# Patient Record
Sex: Female | Born: 1965
Health system: Southern US, Community
[De-identification: ages and names within clinical notes are randomized; demographics above are authoritative.]

## PROBLEM LIST (undated history)

## (undated) DIAGNOSIS — D649 Anemia, unspecified: Secondary | ICD-10-CM

## (undated) DIAGNOSIS — E038 Other specified hypothyroidism: Secondary | ICD-10-CM

## (undated) DIAGNOSIS — K7030 Alcoholic cirrhosis of liver without ascites: Secondary | ICD-10-CM

## (undated) DIAGNOSIS — I639 Cerebral infarction, unspecified: Secondary | ICD-10-CM

## (undated) DIAGNOSIS — M818 Other osteoporosis without current pathological fracture: Secondary | ICD-10-CM

## (undated) DIAGNOSIS — K909 Intestinal malabsorption, unspecified: Secondary | ICD-10-CM

## (undated) DIAGNOSIS — K219 Gastro-esophageal reflux disease without esophagitis: Secondary | ICD-10-CM

## (undated) DIAGNOSIS — E559 Vitamin D deficiency, unspecified: Secondary | ICD-10-CM

## (undated) DIAGNOSIS — Z5189 Encounter for other specified aftercare: Secondary | ICD-10-CM

## (undated) DIAGNOSIS — F3130 Bipolar disorder, current episode depressed, mild or moderate severity, unspecified: Secondary | ICD-10-CM

## (undated) DIAGNOSIS — F411 Generalized anxiety disorder: Secondary | ICD-10-CM

## (undated) DIAGNOSIS — K766 Portal hypertension: Secondary | ICD-10-CM

## (undated) DIAGNOSIS — E876 Hypokalemia: Secondary | ICD-10-CM

## (undated) DIAGNOSIS — C801 Malignant (primary) neoplasm, unspecified: Secondary | ICD-10-CM

## (undated) DIAGNOSIS — N189 Chronic kidney disease, unspecified: Secondary | ICD-10-CM

## (undated) DIAGNOSIS — F04 Amnestic disorder due to known physiological condition: Secondary | ICD-10-CM

## (undated) DIAGNOSIS — IMO0002 Reserved for concepts with insufficient information to code with codable children: Secondary | ICD-10-CM

## (undated) HISTORY — DX: Chronic kidney disease, unspecified: N18.9

## (undated) HISTORY — DX: Encounter for other specified aftercare: Z51.89

## (undated) HISTORY — DX: Reserved for concepts with insufficient information to code with codable children: IMO0002

## (undated) HISTORY — DX: Anemia, unspecified: D64.9

## (undated) HISTORY — DX: Malignant (primary) neoplasm, unspecified: C80.1

## (undated) HISTORY — DX: Intestinal malabsorption, unspecified: K90.9

## (undated) HISTORY — PX: THYROIDECTOMY: SHX17

## (undated) HISTORY — DX: Hypokalemia: E87.6

## (undated) HISTORY — DX: Gastro-esophageal reflux disease without esophagitis: K21.9

## (undated) HISTORY — DX: Alcoholic cirrhosis of liver without ascites: K70.30

## (undated) HISTORY — PX: GASTRIC BYPASS: SHX52

## (undated) HISTORY — DX: Other specified hypothyroidism: E03.8

## (undated) HISTORY — PX: CATARACT EXTRACTION: SUR2

## (undated) HISTORY — PX: HEMORRHOID SURGERY: SHX153

## (undated) HISTORY — DX: Amnestic disorder due to known physiological condition: F04

## (undated) HISTORY — DX: Other osteoporosis without current pathological fracture: M81.8

## (undated) HISTORY — DX: Cerebral infarction, unspecified: I63.9

## (undated) HISTORY — DX: Portal hypertension: K76.6

## (undated) HISTORY — DX: Vitamin D deficiency, unspecified: E55.9

## (undated) HISTORY — DX: Generalized anxiety disorder: F41.1

## (undated) HISTORY — DX: Bipolar disorder, current episode depressed, mild or moderate severity, unspecified: F31.30

## (undated) HISTORY — PX: HERNIA REPAIR: SHX51

## (undated) HISTORY — PX: OTHER SURGICAL HISTORY: SHX169

## (undated) HISTORY — PX: BREAST EXCISIONAL BIOPSY: SUR124

## (undated) HISTORY — PX: PARTIAL HYSTERECTOMY: SHX80

---

## 1988-07-20 HISTORY — PX: OTHER SURGICAL HISTORY: SHX169

## 1997-05-20 HISTORY — PX: PARTIAL HYSTERECTOMY: SHX80

## 1999-02-19 ENCOUNTER — Other Ambulatory Visit: Admission: RE | Admit: 1999-02-19 | Discharge: 1999-02-19 | Payer: Self-pay | Admitting: Obstetrics and Gynecology

## 1999-06-01 ENCOUNTER — Ambulatory Visit (HOSPITAL_COMMUNITY): Admission: EM | Admit: 1999-06-01 | Discharge: 1999-06-01 | Payer: Self-pay

## 1999-06-01 ENCOUNTER — Encounter: Payer: Self-pay | Admitting: Orthopedic Surgery

## 1999-06-19 ENCOUNTER — Emergency Department (HOSPITAL_COMMUNITY): Admission: EM | Admit: 1999-06-19 | Discharge: 1999-06-19 | Payer: Self-pay | Admitting: Emergency Medicine

## 1999-10-14 ENCOUNTER — Ambulatory Visit (HOSPITAL_BASED_OUTPATIENT_CLINIC_OR_DEPARTMENT_OTHER): Admission: RE | Admit: 1999-10-14 | Discharge: 1999-10-14 | Payer: Self-pay | Admitting: Orthopedic Surgery

## 2003-07-21 HISTORY — PX: GASTRIC BYPASS: SHX52

## 2003-08-22 ENCOUNTER — Other Ambulatory Visit: Admission: RE | Admit: 2003-08-22 | Discharge: 2003-08-22 | Payer: Self-pay | Admitting: Internal Medicine

## 2004-08-11 ENCOUNTER — Ambulatory Visit: Payer: Self-pay | Admitting: Internal Medicine

## 2004-08-11 ENCOUNTER — Inpatient Hospital Stay (HOSPITAL_COMMUNITY): Admission: EM | Admit: 2004-08-11 | Discharge: 2004-08-14 | Payer: Self-pay | Admitting: Emergency Medicine

## 2004-08-20 ENCOUNTER — Emergency Department (HOSPITAL_COMMUNITY): Admission: EM | Admit: 2004-08-20 | Discharge: 2004-08-20 | Payer: Self-pay | Admitting: Emergency Medicine

## 2004-08-22 ENCOUNTER — Ambulatory Visit: Payer: Self-pay | Admitting: Internal Medicine

## 2004-09-19 ENCOUNTER — Ambulatory Visit: Payer: Self-pay | Admitting: Internal Medicine

## 2004-12-22 ENCOUNTER — Ambulatory Visit: Payer: Self-pay | Admitting: Internal Medicine

## 2005-01-14 ENCOUNTER — Ambulatory Visit: Payer: Self-pay | Admitting: Internal Medicine

## 2005-01-26 ENCOUNTER — Ambulatory Visit: Payer: Self-pay | Admitting: Internal Medicine

## 2005-06-01 ENCOUNTER — Ambulatory Visit: Payer: Self-pay | Admitting: Psychiatry

## 2005-06-01 ENCOUNTER — Inpatient Hospital Stay (HOSPITAL_COMMUNITY): Admission: EM | Admit: 2005-06-01 | Discharge: 2005-06-03 | Payer: Self-pay | Admitting: Psychiatry

## 2005-07-21 ENCOUNTER — Ambulatory Visit: Payer: Self-pay | Admitting: Internal Medicine

## 2005-09-14 ENCOUNTER — Ambulatory Visit: Payer: Self-pay | Admitting: Oncology

## 2006-01-04 ENCOUNTER — Ambulatory Visit: Payer: Self-pay | Admitting: Oncology

## 2006-03-01 ENCOUNTER — Ambulatory Visit: Payer: Self-pay | Admitting: Oncology

## 2006-04-26 ENCOUNTER — Ambulatory Visit: Payer: Self-pay | Admitting: Oncology

## 2010-03-18 ENCOUNTER — Encounter
Admission: RE | Admit: 2010-03-18 | Discharge: 2010-04-16 | Payer: Self-pay | Source: Home / Self Care | Admitting: Diagnostic Neuroimaging

## 2010-07-01 ENCOUNTER — Encounter
Admission: RE | Admit: 2010-07-01 | Discharge: 2010-07-01 | Payer: Self-pay | Source: Home / Self Care | Attending: Gastroenterology | Admitting: Gastroenterology

## 2010-12-05 NOTE — Op Note (Signed)
[place]. [facility]  Patient:    Molly Parrish, Molly Parrish                    MRN: [id number] Proc. Date: 10/14/99 Adm. Date:  [id number] Attending:  Sypher, Douglass Rivers CC:         Molly Fitch. Sypher, Montez Hageman., M.D. (2)                           Operative Report  PREOPERATIVE DIAGNOSIS:  Status post segmental fracture of left small finger proximal phalanx and central slip injury to proximal interphalangeal joint.  POSTOPERATIVE DIAGNOSIS:  Healed segmental fracture of left small finger proximal phalanx and healed boutonnierre injury.  OPERATION PERFORMED: 1. Proximal interphalangeal capsulectomy and extensor tenolysis, left small finger. 2. Flexor tenolysis, left small finger.  SURGEON:  Molly Fitch. Sypher, Montez Hageman., M.D.  ASSISTANT:  Molly Bitter. Chabon, P.A.  ANESTHESIA:  IV regional at forearm level.  SUPERVISING ANESTHESIOLOGIST:  Dr. Krista Parrish.  INDICATIONS FOR PROCEDURE:  Molly Parrish is a 45 year old woman who fell on May 31, 1999 sustaining segmental injury to her left small finger.  She was seen at the [facility] emergency room and taken directly to the operating room for open reduction internal fixation of her segmental fracture of the proximal phalanx and repair of the central slip.  She has gone on to heal her fracture but has 0 degrees flexion at her PIP joint and an extensor lag at her IP joint due to severe tenodesis.  She has no active flexion also due to flexor tenodesis due to her complex fracture predicament.  She is brought to the operating room at this time anticipating tenolysis of her  extensor, flexor and PIP capsulectomy.  DESCRIPTION OF PROCEDURE:  Molly Parrish was brought to the operating room and placed in supine position on the operating table.  Following IV regional block t forearm level, the left arm was prepped with Betadine soap and solution and sterilely draped.  The procedure commenced  with incision through the previous dorsal scar. Subcutaneous tissues were carefully divided taking care to preserve the longitudinal veins.  The extensor tendon was identified, carefully lysed from the middle and proximal phalanges with a Therapist, nutritional, Market researcher and rongeur.  The PIP capsule was removed from the dorsal aspect of the ulnar collateral ligament to the dorsal aspect of the radial collateral ligament. The spaces beneath the collateral ligaments were created with the use of a Kleinert  elevator.  The PIP joint was then gently manipulated.  Full passive motion 0 to 110 degrees was recovered.  The DIP motion was noted to be 10 to 70 degrees.  The lateral bands were lysed.  Full extension of the PIP and DIP joint was recovered except for the flexion contracture of the DIP joint.  A central slip tenotomy was performed to improve the extensor lag at the DIP joint. Attention was then directed to the flexor system which was exposed proximal to he A-1 pulley.  With a Brunner zigzag incision.  The superficialis tendon slips were noted to be considerably scarred as was the profundus tendon.  The superficialis and profundus slips were tenolysed with the use of a Therapist, nutritional, a Regulatory affairs officer and scissors.  After adhesions were released from the dorsal surface of the tendons at the A-2 pulley, full active range of motion of the finger was recovered.  0.25% Marcaine was infiltrated  at the base of the finger. Molly Parrish demonstrated full active flexion and extension of 0 degrees at the PIP joint and extensor lag of 20 degrees at the DIP joint following tenolysis under anesthesia.  The wound was then inspected for bleeding points which were electrocauterized by bipolar current followed by repair of the skin with intradermal 3-0 Prolene suture and interrupted sutures of 5-0 nylon in the palm.  She was placed in a voluminous gauze dressing and transferred to the  recovery room with stable vital signs.  She will be discharged with prescriptions for Levaquin 500 mg 1 p.o. q.d. x 5 days as a prophylactic antibiotic.  Also Percocet 5/325 to 2 tablets p.o. q.4-6h. p.r.n. pain total of 20 tablets, no refill.  She will use Advil p.r.n. DD:  10/14/99 TD:  10/15/99 Job: [id number]

## 2010-12-05 NOTE — H&P (Signed)
NAMEKIMMARIE, PASCALE             ACCOUNT NO.:  [id number]   MEDICAL RECORD NO.:  [id number]          PATIENT TYPE:  INP   LOCATION:  0351                         FACILITY:  [facility]   PHYSICIAN:  Valetta Mole. Swords, M.D. [facility] OF BIRTH:  1966-04-07   DATE OF ADMISSION:  08/11/2004  DATE OF DISCHARGE:                                HISTORY & PHYSICAL   CHIEF COMPLAINT:  Alcohol use.   HISTORY OF PRESENT ILLNESS:  Ms. Kettlewell is a 45 year old female who came  to my office today complaining of sinus symptoms and possible urinary  symptoms but her real reason was to confess that she has a long history of  alcohol use and abuse and request evaluation for detoxification.  She states  that she drinks daily, all day long.  She drinks up to a case of beer per  day plus a fifth of alcohol on top of that.  She states if she goes very  many hours without drinking she starts to get agitated and frigidity and  very nervous.  She has stopped alcohol when she was pregnant 12 years ago  but has drunk daily since that time.   PAST MEDICAL HISTORY:  Significant for obesity, she has a history of gastric  bypass which was successful.   SOCIAL HISTORY:  She is married, she has one daughter, she is a nonsmoker.  Alcohol use as above.   FAMILY HISTORY:  Significant for both parents being alcoholics.   CURRENT MEDICATIONS:  None.   REVIEW OF SYMPTOMS:  She states she had one episode of dysuria yesterday.  Urinalysis in the office was normal. She states she has had some sinus  congestion but this has been ongoing and has not change recently.  She  denies any headaches, she denies any abdominal pain, chest pain, shortness  of breath, lower extremity edema or any other complaints in review of  systems other than those listed above.   PHYSICAL EXAMINATION:  VITAL SIGNS:  Temperature 98, blood pressure 120/70,  respirations 14, heart rate 78.  GENERAL:  She appears as a moderately overweight female in no  acute  distress.  HEENT:  Normocephalic, atraumatic, extraocular muscles are intact.  NECK:  Supple without lymphadenopathy, thyromegaly or jugular venous  distention.  CHEST:  Clear to auscultation without any increased work of breathing.  CARDIAC:  S1 and S2 are normal without murmurs or gallops.  ABDOMEN:  Active bowel sounds, soft, nontender.  There is no  hepatosplenomegaly, no masses are palpated.  EXTREMITIES:  There is no cyanosis, clubbing or edema.  NEUROLOGIC:  She is alert and oriented without any motor or sensory  deficits.  Specifically there is no asterixis.  DERMATOLOGIC:  There is no stigmata of chronic liver disease.   LABORATORY DATA:  Laboratories performed in the hospital, liver function  tests are normal except for an SGOT of 43 and albumin of 3.3.  Urine drug  screen is unremarkable, alcohol level 181 mg per deciliter.  BMET normal  except for a calcium of 8.2.   ASSESSMENT/PLAN:  1.  Alcohol dependence. [facility]      They declined detoxification due to likelihood of severe withdrawal.      This was not questioned.  It appears that the patient could be admitted      to [facility]; however, will admit her to [facility]  Will use acute alcohol withdrawal protocol.  I suspect the      patient will be ready to transfer to [facility] whenever      they deem appropriate. Will resume Paxil 20 mg p.o. q.d.  Initially will      place the patient on telemetry to monitor her heart rate as an indicator      of alcohol withdrawal.  2.  Depression.  Will resume Paxil 40 mg p.o. q.d.      BHS/MEDQ  D:  08/12/2004  T:  08/12/2004  Job:  [id number]

## 2010-12-05 NOTE — Discharge Summary (Signed)
Molly Parrish, Molly Parrish NO.:  [id number]   MEDICAL RECORD NO.:  [id number]          PATIENT TYPE:  IPS   LOCATION:  0300                          FACILITY:  [facility]   PHYSICIAN:  Jeanice Lim, M.D. DATE OF BIRTH:  1966-01-04   DATE OF ADMISSION:  06/01/2005  DATE OF DISCHARGE:  06/03/2005                                 DISCHARGE SUMMARY   IDENTIFYING DATA:  This is a 45 year old married Caucasian female,  voluntarily admitted, presented with a history of alcohol abuse, drinking 40  ounces or more of beer and drinks prior to going to work.  Alcohol use is  escalating.  Had beer in her cooler at work, feeling out of control.  Daughter was aware of drinking.  The patient describes herself as a  functional alcoholic until becoming more out of control, had blackouts, had  been detoxed once before.  Longest period of sobriety was 3 months.  This is  her first [facility] admission.  No outpatient treatment.   ADMISSION MEDICATIONS:  Paxil, Xanax, trazodone.   ALLERGIES:  No known drug allergies.   PHYSICAL AND NEUROLOGICAL EXAMINATION:  Essentially within normal limits,  performed at [facility].   MENTAL STATUS EXAM:  Alert, cooperative female, anxious, casually dressed.  Speech clear, within normal limits.  Mood anxious.  Affect anxious.  Thought  process is goal directed with no evident psychosis.  Anxious, tremulous,  cognitively intact.  Judgment and insight were fair with poor impulse  control.  Cognition intact.   ADMITTING DIAGNOSES:  AXIS I:  Alcohol dependence and depressive disorder,  not otherwise specified.  AXIS II:  Deferred.  AXIS III:  None.  AXIS IV:  Moderate stressors.  AXIS V:  40/65.   HOSPITAL COURSE:  The patient was admitted and ordered routine p.r.n.  medications; underwent further monitoring; was encouraged to participate in  individual, group and milieu therapy; was placed on inpatient safe detox  protocol for safe  monitoring due to the patient's extensive, escalating  alcohol pattern, risk of withdrawal.  The patient was detoxed without  complications and was discharged in improved condition with no dangerous  ideation, having tolerated detox without complications, was given medication  education and developed a relapse prevention plan and showed improved  judgment and insight, was given medication education and discharged on:  1.  Paxil CR 12.5 mg in the morning.  2.  Trazodone 150 mg at night.  3.  Seroquel 50 mg at night.  4.  Neurontin 100 mg three times a day and 2 at night.  5.  Librium 25 mg twice a day for one week, then one time a day for one      week, then stop.   DISPOSITION:  The patient was to follow up with Dr. Bayard Parrish on November  21 at 2:15 and with Molly Parrish on November 27 at 10 a.m.   DISCHARGE DIAGNOSES:  AXIS I:  Alcohol dependence, depressive disorder not  otherwise specified.  AXIS II:  Deferred.  AXIS III:  None.  AXIS IV:  Moderate stressors.  AXIS V:  Global assessment  of function on discharge was 55.      Jeanice Lim, M.D.  Electronically Signed     JEM/MEDQ  D:  06/11/2005  T:  06/11/2005  Job:  [id number]

## 2010-12-05 NOTE — Discharge Summary (Signed)
Molly Parrish, Molly Parrish             ACCOUNT NO.:  [id number]   MEDICAL RECORD NO.:  [id number]          PATIENT TYPE:  INP   LOCATION:  0351                         [facility]   PHYSICIAN:  Valetta Mole. Swords, M.D. Romualdo Bolk OF BIRTH:  Jun 01, 1966   DATE OF ADMISSION:  08/11/2004  DATE OF DISCHARGE:  08/14/2004                           DISCHARGE SUMMARY - REFERRING   DISCHARGE DIAGNOSES:  1.  Chronic alcoholism.  2.  Alcohol withdrawal.  3.  Depression.  4.  Obesity status post gastric bypass surgery.   DISCHARGE MEDICATIONS:  1.  Lorazepam 1 mg p.o. t.i.d. x1 day, 1 mg p.o. b.i.d. x1 day, 1 mg p.o.      daily.  2.  Thiamin 100 mg p.o. daily x7 days.  3.  Paxil 60 mg p.o. daily.   FOLLOW UP:  Dr. Cato Mulligan in 10 to 14 days.   PLAN:  She will attend daily A. A. meetings or equivalent as set up by case  management.   CONDITION ON DISCHARGE:  Improved. No signs or symptoms of alcohol  withdrawal.   HOSPITAL PROCEDURES:  None.   HOSPITAL LABORATORIES:  Liver function tests normal except SGOT of 43,  albumin 3.3, urine drug screen was negative. Alcohol level was 181. BMET was  normal except for a calcium of 8.2.   HOSPITAL COURSE:  The patient was admitted to the hospital service on  August 11, 2004. See that note for details. The patient was admitted due to  increased risk of acute alcohol withdrawal. The patient was evaluated by the  ACT team. The patient was treated aggressively on the alcohol withdrawal  program. She did have some episodes of agitation and, tachycardia and  hypertension.  All these were treated with lorazepam. At the time of  discharge the patient had done well. She was not requiring excessive amounts  of lorazepam. I think she will be safe at home. Alcohol withdrawal protocol  was discussed with the patient in detail with medications as above.  She is  also instructed that she can use Ativan p.r.n. for agitation.  The patient  will be discharged after case  management arranges an outpatient program.      BHS/MEDQ  D:  08/14/2004  T:  08/14/2004  Job:  [id number]

## 2013-03-06 ENCOUNTER — Other Ambulatory Visit: Payer: Self-pay | Admitting: Pathology

## 2013-03-20 HISTORY — PX: THYROIDECTOMY: SHX17

## 2013-05-30 ENCOUNTER — Other Ambulatory Visit (HOSPITAL_COMMUNITY): Payer: Self-pay | Admitting: Endocrinology

## 2013-05-30 DIAGNOSIS — C73 Malignant neoplasm of thyroid gland: Secondary | ICD-10-CM

## 2013-06-06 ENCOUNTER — Encounter (HOSPITAL_COMMUNITY)
Admission: RE | Admit: 2013-06-06 | Discharge: 2013-06-06 | Disposition: A | Discharge status: Home / Self Care | Payer: Commercial Managed Care - PPO | Source: Ambulatory Visit | Attending: Endocrinology | Admitting: Endocrinology

## 2013-06-06 DIAGNOSIS — C73 Malignant neoplasm of thyroid gland: Secondary | ICD-10-CM | POA: Insufficient documentation

## 2013-06-06 MED ORDER — THYROTROPIN ALFA 1.1 MG IM SOLR
0.9000 mg | INTRAMUSCULAR | Status: AC
  Administered 2013-06-06: 0.9 mg via INTRAMUSCULAR

## 2013-06-07 ENCOUNTER — Encounter (HOSPITAL_COMMUNITY)
Admission: RE | Admit: 2013-06-07 | Discharge: 2013-06-07 | Disposition: A | Discharge status: Home / Self Care | Payer: Commercial Managed Care - PPO | Source: Ambulatory Visit | Attending: Endocrinology | Admitting: Endocrinology

## 2013-06-07 MED ORDER — THYROTROPIN ALFA 1.1 MG IM SOLR
0.9000 mg | INTRAMUSCULAR | Status: AC
  Administered 2013-06-07: 0.9 mg via INTRAMUSCULAR

## 2013-06-08 ENCOUNTER — Encounter (HOSPITAL_COMMUNITY)
Admission: RE | Admit: 2013-06-08 | Discharge: 2013-06-08 | Disposition: A | Discharge status: Home / Self Care | Payer: Commercial Managed Care - PPO | Source: Ambulatory Visit | Attending: Endocrinology | Admitting: Endocrinology

## 2013-06-08 LAB — HCG, SERUM, QUALITATIVE: Preg, Serum: NEGATIVE

## 2013-06-08 MED ORDER — SODIUM IODIDE I 131 CAPSULE
108.0000 | Freq: Once | INTRAVENOUS | Status: AC | PRN
  Administered 2013-06-08: 108 via ORAL

## 2013-06-16 ENCOUNTER — Encounter (HOSPITAL_COMMUNITY)
Admission: RE | Admit: 2013-06-16 | Discharge: 2013-06-16 | Disposition: A | Discharge status: Home / Self Care | Payer: Commercial Managed Care - PPO | Source: Ambulatory Visit | Attending: Endocrinology | Admitting: Endocrinology

## 2013-06-16 DIAGNOSIS — C73 Malignant neoplasm of thyroid gland: Secondary | ICD-10-CM | POA: Insufficient documentation

## 2013-10-04 DIAGNOSIS — F1021 Alcohol dependence, in remission: Secondary | ICD-10-CM

## 2013-10-04 DIAGNOSIS — K909 Intestinal malabsorption, unspecified: Secondary | ICD-10-CM | POA: Insufficient documentation

## 2013-10-04 DIAGNOSIS — K7460 Unspecified cirrhosis of liver: Secondary | ICD-10-CM | POA: Insufficient documentation

## 2013-10-04 HISTORY — DX: Alcohol dependence, in remission: F10.21

## 2013-11-07 DIAGNOSIS — F429 Obsessive-compulsive disorder, unspecified: Secondary | ICD-10-CM | POA: Insufficient documentation

## 2013-11-07 DIAGNOSIS — F39 Unspecified mood [affective] disorder: Secondary | ICD-10-CM | POA: Insufficient documentation

## 2013-11-07 HISTORY — DX: Obsessive-compulsive disorder, unspecified: F42.9

## 2013-11-07 HISTORY — DX: Unspecified mood (affective) disorder: F39

## 2014-04-20 DIAGNOSIS — I639 Cerebral infarction, unspecified: Secondary | ICD-10-CM | POA: Insufficient documentation

## 2014-04-20 DIAGNOSIS — G43909 Migraine, unspecified, not intractable, without status migrainosus: Secondary | ICD-10-CM

## 2014-04-20 DIAGNOSIS — R413 Other amnesia: Secondary | ICD-10-CM | POA: Insufficient documentation

## 2014-04-20 HISTORY — DX: Migraine, unspecified, not intractable, without status migrainosus: G43.909

## 2014-05-04 DIAGNOSIS — G5603 Carpal tunnel syndrome, bilateral upper limbs: Secondary | ICD-10-CM | POA: Insufficient documentation

## 2014-12-14 ENCOUNTER — Other Ambulatory Visit (HOSPITAL_COMMUNITY): Payer: Self-pay | Admitting: Endocrinology

## 2014-12-14 DIAGNOSIS — C73 Malignant neoplasm of thyroid gland: Secondary | ICD-10-CM

## 2014-12-31 ENCOUNTER — Encounter (HOSPITAL_COMMUNITY)
Admission: RE | Admit: 2014-12-31 | Discharge: 2014-12-31 | Disposition: A | Discharge status: Home / Self Care | Payer: Commercial Managed Care - PPO | Source: Ambulatory Visit | Attending: Endocrinology | Admitting: Endocrinology

## 2014-12-31 DIAGNOSIS — C73 Malignant neoplasm of thyroid gland: Secondary | ICD-10-CM | POA: Insufficient documentation

## 2014-12-31 MED ORDER — THYROTROPIN ALFA 1.1 MG IM SOLR
0.9000 mg | INTRAMUSCULAR | Status: AC
  Administered 2014-12-31: 0.9 mg via INTRAMUSCULAR

## 2015-01-01 ENCOUNTER — Encounter (HOSPITAL_COMMUNITY)
Admission: RE | Admit: 2015-01-01 | Discharge: 2015-01-01 | Disposition: A | Discharge status: Home / Self Care | Payer: Commercial Managed Care - PPO | Source: Ambulatory Visit | Attending: Endocrinology | Admitting: Endocrinology

## 2015-01-01 DIAGNOSIS — C73 Malignant neoplasm of thyroid gland: Secondary | ICD-10-CM | POA: Insufficient documentation

## 2015-01-01 MED ORDER — THYROTROPIN ALFA 1.1 MG IM SOLR
0.9000 mg | INTRAMUSCULAR | Status: AC
  Administered 2015-01-01: 0.9 mg via INTRAMUSCULAR

## 2015-01-02 ENCOUNTER — Encounter (HOSPITAL_COMMUNITY)
Admission: RE | Admit: 2015-01-02 | Discharge: 2015-01-02 | Disposition: A | Discharge status: Home / Self Care | Payer: Commercial Managed Care - PPO | Source: Ambulatory Visit | Attending: Endocrinology | Admitting: Endocrinology

## 2015-01-02 DIAGNOSIS — C73 Malignant neoplasm of thyroid gland: Secondary | ICD-10-CM | POA: Diagnosis not present

## 2015-01-02 LAB — HCG, SERUM, QUALITATIVE: PREG SERUM: NEGATIVE

## 2015-01-02 MED ORDER — SODIUM IODIDE I 131 CAPSULE
4.0000 | Freq: Once | INTRAVENOUS | Status: AC | PRN
  Administered 2015-01-02: 4 via ORAL

## 2015-01-04 ENCOUNTER — Encounter (HOSPITAL_COMMUNITY)
Admission: RE | Admit: 2015-01-04 | Discharge: 2015-01-04 | Disposition: A | Discharge status: Home / Self Care | Payer: Commercial Managed Care - PPO | Source: Ambulatory Visit | Attending: Endocrinology | Admitting: Endocrinology

## 2015-01-04 DIAGNOSIS — C73 Malignant neoplasm of thyroid gland: Secondary | ICD-10-CM | POA: Insufficient documentation

## 2015-12-10 DIAGNOSIS — C73 Malignant neoplasm of thyroid gland: Secondary | ICD-10-CM | POA: Insufficient documentation

## 2015-12-10 DIAGNOSIS — E890 Postprocedural hypothyroidism: Secondary | ICD-10-CM

## 2015-12-10 DIAGNOSIS — E039 Hypothyroidism, unspecified: Secondary | ICD-10-CM | POA: Insufficient documentation

## 2015-12-10 HISTORY — DX: Hypocalcemia: E83.51

## 2015-12-10 HISTORY — DX: Postprocedural hypothyroidism: E89.0

## 2015-12-10 HISTORY — DX: Malignant neoplasm of thyroid gland: C73

## 2016-08-08 DIAGNOSIS — D638 Anemia in other chronic diseases classified elsewhere: Secondary | ICD-10-CM | POA: Insufficient documentation

## 2016-08-08 HISTORY — DX: Anemia in other chronic diseases classified elsewhere: D63.8

## 2016-09-03 DIAGNOSIS — K7030 Alcoholic cirrhosis of liver without ascites: Secondary | ICD-10-CM | POA: Diagnosis not present

## 2016-09-03 DIAGNOSIS — Z9884 Bariatric surgery status: Secondary | ICD-10-CM | POA: Diagnosis not present

## 2016-09-03 DIAGNOSIS — D649 Anemia, unspecified: Secondary | ICD-10-CM | POA: Diagnosis not present

## 2016-09-10 DIAGNOSIS — E611 Iron deficiency: Secondary | ICD-10-CM | POA: Diagnosis not present

## 2016-09-10 DIAGNOSIS — N289 Disorder of kidney and ureter, unspecified: Secondary | ICD-10-CM | POA: Diagnosis not present

## 2016-09-10 DIAGNOSIS — D649 Anemia, unspecified: Secondary | ICD-10-CM | POA: Diagnosis not present

## 2016-11-27 DIAGNOSIS — K7460 Unspecified cirrhosis of liver: Secondary | ICD-10-CM | POA: Diagnosis not present

## 2016-11-27 DIAGNOSIS — D509 Iron deficiency anemia, unspecified: Secondary | ICD-10-CM | POA: Diagnosis not present

## 2018-11-06 DIAGNOSIS — I959 Hypotension, unspecified: Secondary | ICD-10-CM

## 2018-11-06 DIAGNOSIS — E870 Hyperosmolality and hypernatremia: Secondary | ICD-10-CM

## 2018-11-06 DIAGNOSIS — K229 Disease of esophagus, unspecified: Secondary | ICD-10-CM

## 2018-11-06 DIAGNOSIS — W19XXXA Unspecified fall, initial encounter: Secondary | ICD-10-CM | POA: Diagnosis not present

## 2018-11-06 DIAGNOSIS — M549 Dorsalgia, unspecified: Secondary | ICD-10-CM | POA: Diagnosis not present

## 2018-11-06 DIAGNOSIS — E872 Acidosis: Secondary | ICD-10-CM | POA: Diagnosis not present

## 2018-11-06 DIAGNOSIS — R571 Hypovolemic shock: Secondary | ICD-10-CM

## 2018-11-06 DIAGNOSIS — N179 Acute kidney failure, unspecified: Secondary | ICD-10-CM | POA: Diagnosis not present

## 2018-11-07 DIAGNOSIS — M549 Dorsalgia, unspecified: Secondary | ICD-10-CM | POA: Diagnosis not present

## 2018-11-07 DIAGNOSIS — N179 Acute kidney failure, unspecified: Secondary | ICD-10-CM | POA: Diagnosis not present

## 2018-11-07 DIAGNOSIS — E860 Dehydration: Secondary | ICD-10-CM

## 2018-11-07 DIAGNOSIS — E876 Hypokalemia: Secondary | ICD-10-CM

## 2018-11-07 DIAGNOSIS — E890 Postprocedural hypothyroidism: Secondary | ICD-10-CM | POA: Diagnosis not present

## 2018-11-07 DIAGNOSIS — K7030 Alcoholic cirrhosis of liver without ascites: Secondary | ICD-10-CM | POA: Diagnosis not present

## 2018-11-07 DIAGNOSIS — K2270 Barrett's esophagus without dysplasia: Secondary | ICD-10-CM

## 2018-11-08 DIAGNOSIS — M549 Dorsalgia, unspecified: Secondary | ICD-10-CM | POA: Diagnosis not present

## 2018-11-08 DIAGNOSIS — N179 Acute kidney failure, unspecified: Secondary | ICD-10-CM | POA: Diagnosis not present

## 2018-11-08 DIAGNOSIS — K7030 Alcoholic cirrhosis of liver without ascites: Secondary | ICD-10-CM | POA: Diagnosis not present

## 2018-11-08 DIAGNOSIS — E890 Postprocedural hypothyroidism: Secondary | ICD-10-CM | POA: Diagnosis not present

## 2019-08-29 ENCOUNTER — Other Ambulatory Visit: Payer: Self-pay | Admitting: Family Medicine

## 2019-08-31 ENCOUNTER — Other Ambulatory Visit: Payer: Self-pay | Admitting: Family Medicine

## 2019-09-03 ENCOUNTER — Other Ambulatory Visit: Payer: Self-pay | Admitting: Family Medicine

## 2019-09-22 ENCOUNTER — Encounter: Payer: Self-pay | Admitting: Legal Medicine

## 2019-09-22 ENCOUNTER — Telehealth (INDEPENDENT_AMBULATORY_CARE_PROVIDER_SITE_OTHER): Payer: 59 | Admitting: Legal Medicine

## 2019-09-22 DIAGNOSIS — Z20822 Contact with and (suspected) exposure to covid-19: Secondary | ICD-10-CM

## 2019-09-22 NOTE — Progress Notes (Signed)
Virtual Visit via Video Note   This visit type was conducted due to national recommendations for restrictions regarding the COVID-19 Pandemic (e.g. social distancing) in an effort to limit this patient's exposure and mitigate transmission in our community.  Due to her co-morbid illnesses, this patient is at least at moderate risk for complications without adequate follow up.  This format is felt to be most appropriate for this patient at this time.  All issues noted in this document were discussed and addressed.  A limited physical exam was performed with this format.  A verbal consent was obtained for the virtual visit.   Date:  09/22/2019   ID:  Arren, Dunavan 1965/11/17, MRN [id number]  Patient Location: Home Provider Location: Office  PCP:  Rochel Brome, MD   Evaluation Performed:  New Patient Evaluation  Chief Complaint:  Onset of fatigue, diffuse myalgias, nausea and diarrhea for 3 days, positive Covid 19 exposure.  History of Present Illness:    Molly Parrish is a 54 y.o. female with Onset of fatigue, headache, myalgias, and nausea 3 days a go associated with diarrhea and temperature 100.8  The patient does have symptoms concerning for COVID-19 infection (fever, chills, cough, or new shortness of breath).    History reviewed. No pertinent past medical history. History reviewed. No pertinent surgical history.   Current Meds  Medication Sig  . ALPRAZolam (XANAX) 1 MG tablet take 1 tablet by mouth daily if needed  . calcitRIOL (ROCALTROL) 0.5 MCG capsule TAKE 2 CAPSULES BY MOUTH TWICE A DAY.  Marland Kitchen dexlansoprazole (DEXILANT) 60 MG capsule Take by mouth.  . ergocalciferol (VITAMIN D2) 1.25 MG (50000 UT) capsule Take by mouth.  . famotidine (PEPCID) 40 MG tablet   . furosemide (LASIX) 20 MG tablet Take by mouth.  . gabapentin (NEURONTIN) 300 MG capsule Take by mouth.  . hydrOXYzine (VISTARIL) 25 MG capsule TAKE 1 CAPSULE(25 MG) BY MOUTH THREE TIMES DAILY  . Iron-FA-B  Cmp-C-Biot-Probiotic (FUSION PLUS) CAPS   . lamoTRIgine (LAMICTAL) 200 MG tablet Take 1 at night  . levothyroxine (SYNTHROID) 200 MCG tablet TAKE 1 TABLET BY MOUTH ONCE DAILY EXCEPT FOR SUNDAY ONLY TAKE 1/2 OF A TABLET  . lubiprostone (AMITIZA) 24 MCG capsule Take by mouth.  . metoCLOPramide (REGLAN) 10 MG tablet Take by mouth.  . potassium chloride SA (KLOR-CON) 20 MEQ tablet Take by mouth.  . promethazine (PHENERGAN) 25 MG tablet TAKE 1 TABLET(25 MG) BY MOUTH FOUR TIMES DAILY AS NEEDED FOR NAUSEA OR VOMITING  . propranolol (INDERAL) 10 MG tablet Take by mouth.  . QUEtiapine (SEROQUEL) 50 MG tablet Take  2 at night  . sertraline (ZOLOFT) 100 MG tablet Take 1 tab twice a day  . spironolactone (ALDACTONE) 50 MG tablet TAKE 1 TABLET BY MOUTH TWICE DAILY  . temazepam (RESTORIL) 30 MG capsule Take 1 to 2 at night  . topiramate ER (QUDEXY XR) 150 MG CS24 sprinkle capsule Take by mouth.  . traMADol (ULTRAM) 50 MG tablet TAKE 2 TABLETS BY MOUTH TWICE DAILY FOR BACK PAIN     Allergies:   Patient has no known allergies.   Social History   Tobacco Use  . Smoking status: Never Smoker  . Smokeless tobacco: Never Used  Substance Use Topics  . Alcohol use: Not Currently  . Drug use: Never     Family Hx: The patient's family history is not on file.  ROS:   Please see the history of present illness.  All other systems reviewed and are negative.  Labs/Other Tests and Data Reviewed:    Recent Labs: No results found for requested labs within last 8760 hours.   Recent Lipid Panel No results found for: CHOL, TRIG, HDL, CHOLHDL, LDLCALC, LDLDIRECT  Wt Readings from Last 3 Encounters:  09/22/19 143 lb (64.9 kg)     Objective:    Vital Signs:  BP 117/62   Temp (!) 100.6 F (38.1 C)   Ht 5\' 6"  (1.676 m)   Wt 143 lb (64.9 kg)   BMI 23.08 kg/m    Physical Exam Appears ill on video  ASSESSMENT & PLAN:     Problem List Items Addressed This Visit      Other   Close exposure to  COVID-19 virus    Patient is having symptoms for Covid 19, she is to quarantine and come to office for Covid test.         Close exposure to COVID-19 virus Patient is having symptoms for Covid 19, she is to quarantine and come to office for Covid test.   No orders of the defined types were placed in this encounter.   COVID-19 Education: The signs and symptoms of COVID-19 were discussed with the patient and how to seek care for testing (follow up with PCP or arrange E-visit). The importance of social distancing was discussed today.  Time:   Today, I have spent 20 minutes with the patient with telehealth technology discussing the above problems.     Medication Adjustments/Labs and Tests Ordered: Current medicines are reviewed at length with the patient today.  Concerns regarding medicines are outlined above.   Tests Ordered: No orders of the defined types were placed in this encounter.   Medication Changes: No orders of the defined types were placed in this encounter.   Follow Up:  Virtual Visit  prn  Signed, Reinaldo Meeker, MD  09/22/2019 8:51 AM    [place]

## 2019-09-22 NOTE — Assessment & Plan Note (Signed)
Patient is having symptoms for Covid 19, she is to quarantine and come to office for Covid test.

## 2019-09-23 LAB — NOVEL CORONAVIRUS, NAA: SARS-CoV-2, NAA: NOT DETECTED

## 2019-09-23 NOTE — Progress Notes (Signed)
Covid negative test lp

## 2019-09-24 ENCOUNTER — Other Ambulatory Visit: Payer: Self-pay | Admitting: Family Medicine

## 2019-10-07 ENCOUNTER — Other Ambulatory Visit: Payer: Self-pay | Admitting: Family Medicine

## 2019-10-17 ENCOUNTER — Other Ambulatory Visit: Payer: Self-pay | Admitting: Family Medicine

## 2019-10-18 ENCOUNTER — Other Ambulatory Visit: Payer: Self-pay | Admitting: Family Medicine

## 2019-10-19 ENCOUNTER — Encounter: Payer: Self-pay | Admitting: Legal Medicine

## 2019-10-19 ENCOUNTER — Other Ambulatory Visit: Payer: Self-pay

## 2019-10-19 ENCOUNTER — Ambulatory Visit (INDEPENDENT_AMBULATORY_CARE_PROVIDER_SITE_OTHER): Payer: 59 | Admitting: Legal Medicine

## 2019-10-19 VITALS — BP 90/50 | HR 73 | Temp 98.4°F | Resp 18 | Ht 66.0 in | Wt 141.0 lb

## 2019-10-19 DIAGNOSIS — R300 Dysuria: Secondary | ICD-10-CM | POA: Insufficient documentation

## 2019-10-19 DIAGNOSIS — M519 Unspecified thoracic, thoracolumbar and lumbosacral intervertebral disc disorder: Secondary | ICD-10-CM | POA: Insufficient documentation

## 2019-10-19 DIAGNOSIS — N3001 Acute cystitis with hematuria: Secondary | ICD-10-CM | POA: Insufficient documentation

## 2019-10-19 DIAGNOSIS — F04 Amnestic disorder due to known physiological condition: Secondary | ICD-10-CM | POA: Insufficient documentation

## 2019-10-19 DIAGNOSIS — K766 Portal hypertension: Secondary | ICD-10-CM | POA: Diagnosis not present

## 2019-10-19 DIAGNOSIS — M816 Localized osteoporosis [Lequesne]: Secondary | ICD-10-CM | POA: Insufficient documentation

## 2019-10-19 DIAGNOSIS — K7030 Alcoholic cirrhosis of liver without ascites: Secondary | ICD-10-CM | POA: Insufficient documentation

## 2019-10-19 DIAGNOSIS — M818 Other osteoporosis without current pathological fracture: Secondary | ICD-10-CM | POA: Insufficient documentation

## 2019-10-19 DIAGNOSIS — C73 Malignant neoplasm of thyroid gland: Secondary | ICD-10-CM | POA: Diagnosis not present

## 2019-10-19 DIAGNOSIS — E038 Other specified hypothyroidism: Secondary | ICD-10-CM | POA: Insufficient documentation

## 2019-10-19 HISTORY — DX: Unspecified thoracic, thoracolumbar and lumbosacral intervertebral disc disorder: M51.9

## 2019-10-19 LAB — POCT URINALYSIS DIPSTICK
Bilirubin, UA: NEGATIVE
Blood, UA: NEGATIVE
Glucose, UA: NEGATIVE
Ketones, UA: NEGATIVE
Leukocytes, UA: NEGATIVE
Nitrite, UA: NEGATIVE
Protein, UA: NEGATIVE
Spec Grav, UA: 1.02 (ref 1.010–1.025)
Urobilinogen, UA: 0.2 E.U./dL
pH, UA: 6 (ref 5.0–8.0)

## 2019-10-19 NOTE — Assessment & Plan Note (Signed)
This is under control with medicine and cessation of alcohol.  No recent bleeding or liver problems.

## 2019-10-19 NOTE — Assessment & Plan Note (Signed)
Urinalysis was clear. She must have cleared her uti at home.  No CVA tenderness.  No fever or chills.

## 2019-10-19 NOTE — Assessment & Plan Note (Signed)
All resolved after treatment , no recurrances

## 2019-10-19 NOTE — Assessment & Plan Note (Signed)
This appears to be resolving on own with no back pain at present visit.  Good ROM back, negative SLR.

## 2019-10-19 NOTE — Progress Notes (Signed)
Acute Office Visit  Subjective:    Patient ID: Molly Parrish, female    DOB: Jan 02, 1966, 54 y.o.   MRN: [id number]  Chief Complaint  Patient presents with  . Urinary Tract Infection    HPI Patient is in today for dysuria for one week.  She tried urine test at home and it was positive for leukocytes.  Now pain is resolving and present urinalysis is negative for Leukocytes. Her back pain is less. She has herniatied disk in past.  Past Medical History:  Diagnosis Date  . Alcoholic cirrhosis of liver ([place])   . Depression   . Generalized anxiety disorder   . Korsakoff syndrome ([place])   . Other osteoporosis without current pathological fracture   . Other specified hypothyroidism   . Portal hypertension (HCC)     Past Surgical History:  Procedure Laterality Date  . CATARACT EXTRACTION    . GASTRIC BYPASS    . HEMORRHOID SURGERY    . HERNIA REPAIR    . metatarsus abductus surgery Left   . PARTIAL HYSTERECTOMY    . THYROIDECTOMY      Family History  Problem Relation Age of Onset  . Cancer Mother   . Osteoporosis Mother     Social History   Socioeconomic History  . Marital status: Married    Spouse name: Not on file  . Number of children: Not on file  . Years of education: Not on file  . Highest education level: Not on file  Occupational History  . Not on file  Tobacco Use  . Smoking status: Never Smoker  . Smokeless tobacco: Never Used  Substance and Sexual Activity  . Alcohol use: Not Currently  . Drug use: Never  . Sexual activity: Yes  Other Topics Concern  . Not on file  Social History Narrative  . Not on file   Social Determinants of Health   Financial Resource Strain:   . Difficulty of Paying Living Expenses:   Food Insecurity:   . Worried About Charity fundraiser in the Last Year:   . Arboriculturist in the Last Year:   Transportation Needs:   . Film/video editor (Medical):   Marland Kitchen Lack of Transportation (Non-Medical):   Physical  Activity:   . Days of Exercise per Week:   . Minutes of Exercise per Session:   Stress:   . Feeling of Stress :   Social Connections:   . Frequency of Communication with Friends and Family:   . Frequency of Social Gatherings with Friends and Family:   . Attends Religious Services:   . Active Member of Clubs or Organizations:   . Attends Archivist Meetings:   Marland Kitchen Marital Status:   Intimate Partner Violence:   . Fear of Current or Ex-Partner:   . Emotionally Abused:   Marland Kitchen Physically Abused:   . Sexually Abused:     Outpatient Medications Prior to Visit  Medication Sig Dispense Refill  . ALPRAZolam (XANAX) 1 MG tablet TAKE 1 TABLET BY MOUTH TWICE DAILY AS NEEDED FOR SEVERE ANXIETY 60 tablet 1  . calcitRIOL (ROCALTROL) 0.5 MCG capsule TAKE 2 CAPSULES BY MOUTH TWICE A DAY.    . ergocalciferol (VITAMIN D2) 1.25 MG (50000 UT) capsule Take by mouth.    . famotidine (PEPCID) 40 MG tablet     . furosemide (LASIX) 20 MG tablet Take by mouth.    . hydrOXYzine (VISTARIL) 25 MG capsule TAKE 1 CAPSULE(25 MG) BY MOUTH  THREE TIMES DAILY 90 capsule 0  . Iron-FA-B Cmp-C-Biot-Probiotic (FUSION PLUS) CAPS     . lamoTRIgine (LAMICTAL) 200 MG tablet Take 1 at night    . levothyroxine (SYNTHROID) 200 MCG tablet TAKE 1 TABLET BY MOUTH ONCE DAILY EXCEPT FOR SUNDAY ONLY TAKE 1/2 OF A TABLET    . potassium chloride SA (KLOR-CON) 20 MEQ tablet TAKE 4 TABLETS BY MOUTH TWICE DAILY 240 tablet 0  . promethazine (PHENERGAN) 25 MG tablet TAKE 1 TABLET(25 MG) BY MOUTH FOUR TIMES DAILY AS NEEDED FOR NAUSEA OR VOMITING 40 tablet 2  . propranolol (INDERAL) 10 MG tablet Take by mouth.    . QUEtiapine (SEROQUEL) 50 MG tablet Take  2 at night    . sertraline (ZOLOFT) 100 MG tablet Take 1 tab twice a day    . spironolactone (ALDACTONE) 50 MG tablet TAKE 1 TABLET BY MOUTH TWICE DAILY 60 tablet 3  . temazepam (RESTORIL) 30 MG capsule Take 1 to 2 at night    . topiramate ER (QUDEXY XR) 150 MG CS24 sprinkle capsule  Take by mouth.    . traMADol (ULTRAM) 50 MG tablet TAKE 2 TABLETS BY MOUTH TWICE DAILY FOR BACK PAIN 120 tablet 2  . dexlansoprazole (DEXILANT) 60 MG capsule Take by mouth.    . gabapentin (NEURONTIN) 300 MG capsule Take by mouth.    . lubiprostone (AMITIZA) 24 MCG capsule Take by mouth.    . metoCLOPramide (REGLAN) 10 MG tablet Take by mouth.     No facility-administered medications prior to visit.    No Known Allergies  Review of Systems  Constitutional: Negative.   HENT: Negative.   Eyes: Negative.   Respiratory: Negative.   Cardiovascular: Negative.   Gastrointestinal: Negative.   Genitourinary: Positive for flank pain.  Musculoskeletal: Positive for back pain.  Skin: Negative.   Neurological: Negative.        Objective:    Physical Exam Vitals reviewed.  Constitutional:      Appearance: Normal appearance.  HENT:     Head: Normocephalic and atraumatic.     Nose: Nose normal.  Cardiovascular:     Rate and Rhythm: Normal rate and regular rhythm.     Pulses: Normal pulses.     Heart sounds: Normal heart sounds.  Pulmonary:     Effort: Pulmonary effort is normal.     Breath sounds: Normal breath sounds.  Abdominal:     General: Abdomen is flat. Bowel sounds are normal.     Palpations: Abdomen is soft.  Musculoskeletal:        General: Tenderness present.     Cervical back: Normal range of motion and neck supple.  Neurological:     General: No focal deficit present.     Mental Status: She is alert.     BP (!) 90/50   Pulse 73   Temp 98.4 F (36.9 C)   Resp 18   Ht 5' 6"  (1.676 m)   Wt 141 lb (64 kg)   SpO2 97%   BMI 22.76 kg/m  Wt Readings from Last 3 Encounters:  10/19/19 141 lb (64 kg)  09/22/19 143 lb (64.9 kg)    Health Maintenance Due  Topic Date Due  . Hepatitis C Screening  Never done  . HIV Screening  Never done  . TETANUS/TDAP  Never done  . PAP SMEAR-Modifier  Never done  . MAMMOGRAM  Never done  . COLONOSCOPY  Never done     There are no preventive care reminders  to display for this patient.   No results found for: TSH No results found for: WBC, HGB, HCT, MCV, PLT No results found for: NA, K, CHLORIDE, CO2, GLUCOSE, BUN, CREATININE, BILITOT, ALKPHOS, AST, ALT, PROT, ALBUMIN, CALCIUM, ANIONGAP, EGFR, GFR No results found for: CHOL No results found for: HDL No results found for: LDLCALC No results found for: TRIG No results found for: CHOLHDL No results found for: HGBA1C    Component Value Date/Time   BILIRUBINUR negative 10/19/2019 0904   PROTEINUR Negative 10/19/2019 0904   UROBILINOGEN 0.2 10/19/2019 0904   NITRITE negative 10/19/2019 0904   LEUKOCYTESUR Negative 10/19/2019 0904       Component Value Date/Time   BILIRUBINUR negative 10/19/2019 0904   PROTEINUR Negative 10/19/2019 0904   UROBILINOGEN 0.2 10/19/2019 0904   NITRITE negative 10/19/2019 0904   LEUKOCYTESUR Negative 10/19/2019 0904       Assessment & Plan:   Problem List Items Addressed This Visit      Cardiovascular and Mediastinum   Portal hypertension ([place])    This is under control with medicine and cessation of alcohol.  No recent bleeding or liver problems.        Endocrine   Papillary thyroid carcinoma ([place])    All resolved after treatment , no recurrances        Musculoskeletal and Integument   Lumbar disc disease    This appears to be resolving on own with no back pain at present visit.  Good ROM back, negative SLR.        Other   Dysuria - Primary    Urinalysis was clear. She must have cleared her uti at home.  No CVA tenderness.  No fever or chills.      Relevant Orders   POCT urinalysis dipstick (Completed)    Other Visit Diagnoses    Thyroid cancer ([place])   (Chronic)         No orders of the defined types were placed in this encounter.    Reinaldo Meeker, MD

## 2019-10-25 ENCOUNTER — Other Ambulatory Visit: Payer: Self-pay | Admitting: Family Medicine

## 2019-10-31 NOTE — Progress Notes (Signed)
Established Patient Office Visit  Subjective:  Patient ID: Molly Parrish, female    DOB: 1965/11/07  Age: 54 y.o. MRN: [id number]  CC:  Chief Complaint  Patient presents with  . Hypothyroidism  . Hypertension  . Depression  . Cirrhosis    HPI  Molly Parrish presents with a diagnosis of alcoholic cirrhosis of liver without ascites.  The course has been stable and nonprogressive.  Symptoms are relieved with spironolactone,furosemide, and propranol.  Associated symptoms include edema but well controlled with medicines and compression hose.  Patient sees Molly Parrish every 6 months.  Her lactulose and xifaxin were discontinued. Denies constipation.    Additionally, she presents with history of other specified hypothyroidism.  she is currently taking Synthroid, 200 mcg daily.  TSH was last checked one month ago.  Per patient  changes to medicines were made one month ago. Patient is managed by endocrinologist.         Molly Parrish presents with a diagnosis of korsakoff's Syndrome with memory loss.  The course has been stable and nonprogressive.  Long term memory loss is better than short term. Complication of alcohol liver cirrhosis.     Molly Parrish presents with a diagnosis of portal hypertension.  Symptoms are relieved with propranolol 10 mg daily.      Additionally, she presents with history of major depressive disorder, recurrent, mild.  this is a routine follow-up.  Presently, she feels a mild degree of depression.  Current medications include Zoloft 100 mg 2 daily, Lamictal 200 mg one daily, xanax 1 mg one twice a day, seroquel 50 mg 2 daily, and temazepam 30 mg once daily. No current affective symptoms.  Presently, Molly Parrish denies suicidal ideation.    Past Medical History:  Diagnosis Date  . Alcoholic cirrhosis of liver ([place])   . Bipolar disorder current episode depressed ([place])   . Generalized anxiety disorder   . Korsakoff syndrome ([place])   . Other osteoporosis without current pathological fracture     . Other specified hypothyroidism   . Portal hypertension (H[facility])     Past Surgical History:  Procedure Laterality Date  . CATARACT EXTRACTION    . GASTRIC BYPASS    . HEMORRHOID SURGERY    . HERNIA REPAIR    . metatarsus abductus surgery Left   . PARTIAL HYSTERECTOMY    . THYROIDECTOMY      Family History  Problem Relation Age of Onset  . Cancer Mother   . Osteoporosis Mother     Social History   Socioeconomic History  . Marital status: Married    Spouse name: Not on file  . Number of children: Not on file  . Years of education: Not on file  . Highest education level: Not on file  Occupational History  . Not on file  Tobacco Use  . Smoking status: Never Smoker  . Smokeless tobacco: Never Used  Substance and Sexual Activity  . Alcohol use: Not Currently  . Drug use: Never  . Sexual activity: Yes  Other Topics Concern  . Not on file  Social History Narrative  . Not on file   Social Determinants of Health   Financial Resource Strain:   . Difficulty of Paying Living Expenses:   Food Insecurity:   . Worried About Charity fundraiser in the Last Year:   . Arboriculturist in the Last Year:   Transportation Needs:   . Film/video editor (Medical):   Marland Kitchen Lack of Transportation (Non-Medical):  Physical Activity:   . Days of Exercise per Week:   . Minutes of Exercise per Session:   Stress:   . Feeling of Stress :   Social Connections:   . Frequency of Communication with Friends and Family:   . Frequency of Social Gatherings with Friends and Family:   . Attends Religious Services:   . Active Member of Clubs or Organizations:   . Attends Archivist Meetings:   Marland Kitchen Marital Status:   Intimate Partner Violence:   . Fear of Current or Ex-Partner:   . Emotionally Abused:   Marland Kitchen Physically Abused:   . Sexually Abused:     Outpatient Medications Prior to Visit  Medication Sig Dispense Refill  . ALPRAZolam (XANAX) 1 MG tablet TAKE 1 TABLET BY MOUTH TWICE  DAILY AS NEEDED FOR SEVERE ANXIETY 60 tablet 1  . calcitRIOL (ROCALTROL) 0.5 MCG capsule TAKE 2 CAPSULES BY MOUTH TWICE A DAY.    . ergocalciferol (VITAMIN D2) 1.25 MG (50000 UT) capsule Take by mouth.    . famotidine (PEPCID) 40 MG tablet     . furosemide (LASIX) 20 MG tablet Take by mouth.    . hydrOXYzine (VISTARIL) 25 MG capsule TAKE 1 CAPSULE(25 MG) BY MOUTH THREE TIMES DAILY 90 capsule 0  . Iron-FA-B Cmp-C-Biot-Probiotic (FUSION PLUS) CAPS     . lamoTRIgine (LAMICTAL) 200 MG tablet Take 1 at night    . levothyroxine (SYNTHROID) 200 MCG tablet TAKE 1 TABLET BY MOUTH ONCE DAILY EXCEPT FOR SUNDAY ONLY TAKE 1/2 OF A TABLET    . potassium chloride SA (KLOR-CON) 20 MEQ tablet TAKE 4 TABLETS BY MOUTH TWICE DAILY 240 tablet 0  . promethazine (PHENERGAN) 25 MG tablet TAKE 1 TABLET(25 MG) BY MOUTH FOUR TIMES DAILY AS NEEDED FOR NAUSEA OR VOMITING 40 tablet 2  . propranolol (INDERAL) 10 MG tablet Take by mouth.    . QUEtiapine (SEROQUEL) 50 MG tablet Take  2 at night    . sertraline (ZOLOFT) 100 MG tablet Take 1 tab twice a day    . spironolactone (ALDACTONE) 50 MG tablet TAKE 1 TABLET BY MOUTH TWICE DAILY 60 tablet 3  . temazepam (RESTORIL) 30 MG capsule Take 1 to 2 at night    . topiramate ER (QUDEXY XR) 150 MG CS24 sprinkle capsule Take by mouth.    . traMADol (ULTRAM) 50 MG tablet TAKE 2 TABLETS BY MOUTH TWICE DAILY FOR BACK PAIN 120 tablet 2   No facility-administered medications prior to visit.    No Known Allergies  ROS Review of Systems  Constitutional: Negative for chills, fatigue and fever.  HENT: Negative for congestion, ear pain, rhinorrhea and sore throat.   Respiratory: Negative for cough and shortness of breath.   Cardiovascular: Negative for chest pain.  Gastrointestinal: Negative for abdominal pain, constipation, diarrhea, nausea and vomiting.  Genitourinary: Positive for urgency (Always has pressure but never feels as tho she empties her bladder.). Negative for dysuria.    Musculoskeletal: Positive for back pain. Negative for myalgias.       Baseline. Takes tramadol one every day to every other day.   Neurological: Negative for dizziness, weakness, light-headedness and headaches.  Psychiatric/Behavioral: Negative for dysphoric mood. The patient is not nervous/anxious.       Objective:    Physical Exam  Constitutional: She appears well-developed and well-nourished.  Cardiovascular: Normal rate, regular rhythm and normal heart sounds.  Pulmonary/Chest: Effort normal and breath sounds normal.  Abdominal: Soft. Bowel sounds are normal. There is  abdominal tenderness.  Suprapubic region.  Neurological: She is alert.  Psychiatric: She has a normal mood and affect. Her behavior is normal.    BP 108/64 (BP Location: Left Arm, Patient Position: Sitting)   Pulse 88   Temp (!) 97.4 F (36.3 C) (Temporal)   Ht 5\' 6"  (1.676 m)   Wt 143 lb (64.9 kg)   SpO2 98%   BMI 23.08 kg/m  Wt Readings from Last 3 Encounters:  11/01/19 143 lb (64.9 kg)  10/19/19 141 lb (64 kg)  09/22/19 143 lb (64.9 kg)     Health Maintenance Due  Topic Date Due  . Hepatitis C Screening  Never done  . HIV Screening  Never done  . COVID-19 Vaccine (1) Never done  . TETANUS/TDAP  Never done  . PAP SMEAR-Modifier  Never done  . MAMMOGRAM  Never done  . COLONOSCOPY  Never done    There are no preventive care reminders to display for this patient.  Lab Results  Component Value Date   TSH 0.063 (L) 11/01/2019   Lab Results  Component Value Date   WBC 10.6 11/01/2019   HGB 9.3 (L) 11/01/2019   HCT 28.0 (L) 11/01/2019   MCV 90 11/01/2019   PLT 260 11/01/2019   Lab Results  Component Value Date   NA 137 11/01/2019   K 4.3 11/01/2019   CO2 24 11/01/2019   GLUCOSE 60 (L) 11/01/2019   BUN 32 (H) 11/01/2019   CREATININE 1.09 (H) 11/01/2019   BILITOT 0.5 11/01/2019   ALKPHOS 93 11/01/2019   AST 44 (H) 11/01/2019   ALT 39 (H) 11/01/2019   PROT 5.6 (L) 11/01/2019    ALBUMIN 3.6 (L) 11/01/2019   CALCIUM 9.3 11/01/2019   Lab Results  Component Value Date   CHOL 142 11/01/2019   Lab Results  Component Value Date   HDL 59 11/01/2019   Lab Results  Component Value Date   LDLCALC 67 11/01/2019   Lab Results  Component Value Date   TRIG 81 11/01/2019   Lab Results  Component Value Date   CHOLHDL 2.4 11/01/2019   No results found for: HGBA1C    Assessment & Plan:  1. Portal hypertension (HCC) The current medical regimen is effective;  continue present plan and medications. - Comprehensive metabolic panel - CBC with Differential/Platelet - Lipid panel - Amb Referral to Clinical Pharmacist - Ambulatory referral to Chronic Care Management Services  2. Other specified hypothyroidism TSH was off, but endocrinologist adjusted medicine one month ago. I did not know this until labs come back. - TSH - Amb Referral to Clinical Pharmacist - Ambulatory referral to Chronic Care Management Services  3. Acute cystitis without hematuria Order of macrobid 100 mg one twice a day x 7 days then decrease to once daily.  - POCT urinalysis dipstick - Urine Culture  4. Paresthesias - B12 and Folate Panel  5. Bipolar 1 disorder, depressed, mild ([place]) The current medical regimen is effective;  continue present plan and medications. Follow up with psychiatry.  6. Other osteoporosis without current pathological fracture Continue calcium with vitamin D.  - denosumab (PROLIA) injection 60 mg  Follow-up: Return in about 3 months (around 01/31/2020) for fasting. Annual [facility] after 03/30/2020.Marland Kitchen    Rochel Brome, MD

## 2019-11-01 ENCOUNTER — Encounter: Payer: Self-pay | Admitting: Family Medicine

## 2019-11-01 ENCOUNTER — Other Ambulatory Visit: Payer: Self-pay

## 2019-11-01 ENCOUNTER — Ambulatory Visit (INDEPENDENT_AMBULATORY_CARE_PROVIDER_SITE_OTHER): Payer: 59 | Admitting: Family Medicine

## 2019-11-01 VITALS — BP 108/64 | HR 88 | Temp 97.4°F | Ht 66.0 in | Wt 143.0 lb

## 2019-11-01 DIAGNOSIS — M818 Other osteoporosis without current pathological fracture: Secondary | ICD-10-CM

## 2019-11-01 DIAGNOSIS — F3131 Bipolar disorder, current episode depressed, mild: Secondary | ICD-10-CM | POA: Insufficient documentation

## 2019-11-01 DIAGNOSIS — N3000 Acute cystitis without hematuria: Secondary | ICD-10-CM | POA: Insufficient documentation

## 2019-11-01 DIAGNOSIS — R202 Paresthesia of skin: Secondary | ICD-10-CM

## 2019-11-01 DIAGNOSIS — E038 Other specified hypothyroidism: Secondary | ICD-10-CM | POA: Diagnosis not present

## 2019-11-01 DIAGNOSIS — K766 Portal hypertension: Secondary | ICD-10-CM | POA: Diagnosis not present

## 2019-11-01 LAB — POCT URINALYSIS DIPSTICK
Bilirubin, UA: NEGATIVE
Blood, UA: NEGATIVE
Glucose, UA: NEGATIVE
Ketones, UA: NEGATIVE
Nitrite, UA: NEGATIVE
Protein, UA: NEGATIVE
Spec Grav, UA: 1.02 (ref 1.010–1.025)
Urobilinogen, UA: 0.2 E.U./dL
pH, UA: 6 (ref 5.0–8.0)

## 2019-11-01 MED ORDER — NITROFURANTOIN MONOHYD MACRO 100 MG PO CAPS: 100.0000 mg | ORAL_CAPSULE | Freq: Two times a day (BID) | ORAL | 0 refills | Status: DC

## 2019-11-01 MED ORDER — DENOSUMAB 60 MG/ML ~~LOC~~ SOSY
60.0000 mg | PREFILLED_SYRINGE | Freq: Once | SUBCUTANEOUS | Status: AC
  Administered 2019-11-01: 60 mg via SUBCUTANEOUS

## 2019-11-01 NOTE — Patient Instructions (Addendum)
No changes to medicines.  Monitor weight daily. If greater than 3 lb weight gain in one day, and 5 lb gain in one week.  Bladder infection - macrobid 100 mg one twice a day x 1 week, then one daily x 3 months.

## 2019-11-02 LAB — LIPID PANEL
Chol/HDL Ratio: 2.4 ratio (ref 0.0–4.4)
Cholesterol, Total: 142 mg/dL (ref 100–199)
HDL: 59 mg/dL (ref >39)
LDL Chol Calc (NIH): 67 mg/dL (ref 0–99)
Triglycerides: 81 mg/dL (ref 0–149)
VLDL Cholesterol Cal: 16 mg/dL (ref 5–40)

## 2019-11-02 LAB — CBC WITH DIFFERENTIAL/PLATELET
Basophils Absolute: 0.1 10*3/uL (ref 0.0–0.2)
Basos: 1 %
EOS (ABSOLUTE): 0.4 10*3/uL (ref 0.0–0.4)
Eos: 4 %
Hematocrit: 28 % — ABNORMAL LOW (ref 34.0–46.6)
Hemoglobin: 9.3 g/dL — ABNORMAL LOW (ref 11.1–15.9)
Immature Grans (Abs): 0 10*3/uL (ref 0.0–0.1)
Immature Granulocytes: 0 %
Lymphocytes Absolute: 1.7 10*3/uL (ref 0.7–3.1)
Lymphs: 16 %
MCH: 30 pg (ref 26.6–33.0)
MCHC: 33.2 g/dL (ref 31.5–35.7)
MCV: 90 fL (ref 79–97)
Monocytes Absolute: 0.8 10*3/uL (ref 0.1–0.9)
Monocytes: 7 %
Neutrophils Absolute: 7.7 10*3/uL — ABNORMAL HIGH (ref 1.4–7.0)
Neutrophils: 72 %
Platelets: 260 10*3/uL (ref 150–450)
RBC: 3.1 x10E6/uL — ABNORMAL LOW (ref 3.77–5.28)
RDW: 13.2 % (ref 11.7–15.4)
WBC: 10.6 10*3/uL (ref 3.4–10.8)

## 2019-11-02 LAB — COMPREHENSIVE METABOLIC PANEL
ALT: 39 IU/L — ABNORMAL HIGH (ref 0–32)
AST: 44 IU/L — ABNORMAL HIGH (ref 0–40)
Albumin/Globulin Ratio: 1.8 (ref 1.2–2.2)
Albumin: 3.6 g/dL — ABNORMAL LOW (ref 3.8–4.9)
Alkaline Phosphatase: 93 IU/L (ref 39–117)
BUN/Creatinine Ratio: 29 — ABNORMAL HIGH (ref 9–23)
BUN: 32 mg/dL — ABNORMAL HIGH (ref 6–24)
Bilirubin Total: 0.5 mg/dL (ref 0.0–1.2)
CO2: 24 mmol/L (ref 20–29)
Calcium: 9.3 mg/dL (ref 8.7–10.2)
Chloride: 100 mmol/L (ref 96–106)
Creatinine, Ser: 1.09 mg/dL — ABNORMAL HIGH (ref 0.57–1.00)
GFR calc Af Amer: 66 mL/min/{1.73_m2} (ref >59)
GFR calc non Af Amer: 57 mL/min/{1.73_m2} — ABNORMAL LOW (ref >59)
Globulin, Total: 2 g/dL (ref 1.5–4.5)
Glucose: 60 mg/dL — ABNORMAL LOW (ref 65–99)
Potassium: 4.3 mmol/L (ref 3.5–5.2)
Sodium: 137 mmol/L (ref 134–144)
Total Protein: 5.6 g/dL — ABNORMAL LOW (ref 6.0–8.5)

## 2019-11-02 LAB — CARDIOVASCULAR RISK ASSESSMENT

## 2019-11-02 LAB — TSH: TSH: 0.063 u[IU]/mL — ABNORMAL LOW (ref 0.450–4.500)

## 2019-11-03 ENCOUNTER — Other Ambulatory Visit: Payer: Self-pay

## 2019-11-03 DIAGNOSIS — R7989 Other specified abnormal findings of blood chemistry: Secondary | ICD-10-CM

## 2019-11-03 LAB — URINE CULTURE

## 2019-11-06 ENCOUNTER — Other Ambulatory Visit: Payer: Self-pay | Admitting: Family Medicine

## 2019-11-07 ENCOUNTER — Telehealth: Payer: Self-pay | Admitting: Family Medicine

## 2019-11-07 ENCOUNTER — Other Ambulatory Visit: Payer: Self-pay

## 2019-11-07 ENCOUNTER — Other Ambulatory Visit: Payer: 59

## 2019-11-07 LAB — CBC WITH DIFFERENTIAL/PLATELET
Basophils Absolute: 0.1 10*3/uL (ref 0.0–0.2)
Basos: 1 %
EOS (ABSOLUTE): 0.3 10*3/uL (ref 0.0–0.4)
Eos: 4 %
Hematocrit: 27.8 % — ABNORMAL LOW (ref 34.0–46.6)
Hemoglobin: 8.7 g/dL — ABNORMAL LOW (ref 11.1–15.9)
Immature Grans (Abs): 0 10*3/uL (ref 0.0–0.1)
Immature Granulocytes: 0 %
Lymphocytes Absolute: 1.6 10*3/uL (ref 0.7–3.1)
Lymphs: 16 %
MCH: 29.4 pg (ref 26.6–33.0)
MCHC: 31.3 g/dL — ABNORMAL LOW (ref 31.5–35.7)
MCV: 94 fL (ref 79–97)
Monocytes Absolute: 0.8 10*3/uL (ref 0.1–0.9)
Monocytes: 9 %
Neutrophils Absolute: 6.7 10*3/uL (ref 1.4–7.0)
Neutrophils: 70 %
Platelets: 255 10*3/uL (ref 150–450)
RBC: 2.96 x10E6/uL — ABNORMAL LOW (ref 3.77–5.28)
RDW: 13.7 % (ref 11.7–15.4)
WBC: 9.6 10*3/uL (ref 3.4–10.8)

## 2019-11-07 NOTE — Progress Notes (Signed)
  Chronic Care Management   Outreach Note  11/07/2019 Name: Molly Parrish MRN: [id number] DOB: [birth year]  Referred by: Rochel Brome, MD Reason for referral : No chief complaint on file.   An unsuccessful telephone outreach was attempted today. The patient was referred to the pharmacist for assistance with care management and care coordination.   Follow Up Plan:   Earney [place] Upstream Scheduler

## 2019-11-09 ENCOUNTER — Other Ambulatory Visit: Payer: Self-pay

## 2019-11-09 DIAGNOSIS — R944 Abnormal results of kidney function studies: Secondary | ICD-10-CM

## 2019-11-09 DIAGNOSIS — E538 Deficiency of other specified B group vitamins: Secondary | ICD-10-CM

## 2019-11-09 DIAGNOSIS — R7989 Other specified abnormal findings of blood chemistry: Secondary | ICD-10-CM

## 2019-11-14 ENCOUNTER — Other Ambulatory Visit: Payer: 59

## 2019-11-14 ENCOUNTER — Other Ambulatory Visit: Payer: Self-pay

## 2019-11-14 DIAGNOSIS — R7989 Other specified abnormal findings of blood chemistry: Secondary | ICD-10-CM

## 2019-11-14 DIAGNOSIS — R944 Abnormal results of kidney function studies: Secondary | ICD-10-CM

## 2019-11-14 DIAGNOSIS — E538 Deficiency of other specified B group vitamins: Secondary | ICD-10-CM

## 2019-11-15 LAB — COMPREHENSIVE METABOLIC PANEL
ALT: 18 IU/L (ref 0–32)
AST: 16 IU/L (ref 0–40)
Albumin/Globulin Ratio: 1.5 (ref 1.2–2.2)
Albumin: 3.3 g/dL — ABNORMAL LOW (ref 3.8–4.9)
Alkaline Phosphatase: 123 IU/L — ABNORMAL HIGH (ref 39–117)
BUN/Creatinine Ratio: 21 (ref 9–23)
BUN: 23 mg/dL (ref 6–24)
Bilirubin Total: 0.7 mg/dL (ref 0.0–1.2)
CO2: 22 mmol/L (ref 20–29)
Calcium: 8.3 mg/dL — ABNORMAL LOW (ref 8.7–10.2)
Chloride: 103 mmol/L (ref 96–106)
Creatinine, Ser: 1.09 mg/dL — ABNORMAL HIGH (ref 0.57–1.00)
GFR calc Af Amer: 66 mL/min/{1.73_m2} (ref >59)
GFR calc non Af Amer: 57 mL/min/{1.73_m2} — ABNORMAL LOW (ref >59)
Globulin, Total: 2.2 g/dL (ref 1.5–4.5)
Glucose: 67 mg/dL (ref 65–99)
Potassium: 5 mmol/L (ref 3.5–5.2)
Sodium: 140 mmol/L (ref 134–144)
Total Protein: 5.5 g/dL — ABNORMAL LOW (ref 6.0–8.5)

## 2019-11-15 LAB — CBC
Hematocrit: 29 % — ABNORMAL LOW (ref 34.0–46.6)
Hemoglobin: 8.9 g/dL — ABNORMAL LOW (ref 11.1–15.9)
MCH: 29.5 pg (ref 26.6–33.0)
MCHC: 30.7 g/dL — ABNORMAL LOW (ref 31.5–35.7)
MCV: 96 fL (ref 79–97)
Platelets: 297 10*3/uL (ref 150–450)
RBC: 3.02 x10E6/uL — ABNORMAL LOW (ref 3.77–5.28)
RDW: 13.8 % (ref 11.7–15.4)
WBC: 8.8 10*3/uL (ref 3.4–10.8)

## 2019-11-15 LAB — FOLATE: Folate: >20 ng/mL (ref >3.0)

## 2019-11-15 LAB — VITAMIN B12: Vitamin B-12: >2000 pg/mL — ABNORMAL HIGH (ref 232–1245)

## 2019-11-15 LAB — IRON,TIBC AND FERRITIN PANEL
Ferritin: 643 ng/mL — ABNORMAL HIGH (ref 15–150)
Iron Saturation: 34 % (ref 15–55)
Iron: 81 ug/dL (ref 27–159)
Total Iron Binding Capacity: 240 ug/dL — ABNORMAL LOW (ref 250–450)
UIBC: 159 ug/dL (ref 131–425)

## 2019-11-21 ENCOUNTER — Other Ambulatory Visit: Payer: Self-pay | Admitting: Family Medicine

## 2019-11-28 ENCOUNTER — Other Ambulatory Visit: Payer: Self-pay | Admitting: Physician Assistant

## 2019-11-29 ENCOUNTER — Other Ambulatory Visit: Payer: Self-pay | Admitting: Family Medicine

## 2019-12-03 ENCOUNTER — Other Ambulatory Visit: Payer: Self-pay | Admitting: Family Medicine

## 2019-12-13 ENCOUNTER — Other Ambulatory Visit: Payer: Self-pay | Admitting: Physician Assistant

## 2019-12-13 ENCOUNTER — Other Ambulatory Visit: Payer: Self-pay | Admitting: Family Medicine

## 2019-12-24 ENCOUNTER — Other Ambulatory Visit: Payer: Self-pay | Admitting: Family Medicine

## 2019-12-31 ENCOUNTER — Other Ambulatory Visit: Payer: Self-pay | Admitting: Physician Assistant

## 2020-01-01 ENCOUNTER — Other Ambulatory Visit: Payer: Self-pay | Admitting: Family Medicine

## 2020-01-03 ENCOUNTER — Other Ambulatory Visit: Payer: Self-pay | Admitting: Family Medicine

## 2020-01-07 ENCOUNTER — Other Ambulatory Visit: Payer: Self-pay | Admitting: Physician Assistant

## 2020-01-31 ENCOUNTER — Other Ambulatory Visit: Payer: Self-pay | Admitting: Family Medicine

## 2020-01-31 ENCOUNTER — Other Ambulatory Visit: Payer: Self-pay

## 2020-01-31 ENCOUNTER — Encounter: Payer: Self-pay | Admitting: Family Medicine

## 2020-01-31 ENCOUNTER — Ambulatory Visit (INDEPENDENT_AMBULATORY_CARE_PROVIDER_SITE_OTHER): Payer: 59 | Admitting: Family Medicine

## 2020-01-31 VITALS — BP 112/78 | HR 97 | Temp 97.4°F | Ht 66.0 in | Wt 143.0 lb

## 2020-01-31 DIAGNOSIS — G629 Polyneuropathy, unspecified: Secondary | ICD-10-CM | POA: Insufficient documentation

## 2020-01-31 DIAGNOSIS — E038 Other specified hypothyroidism: Secondary | ICD-10-CM

## 2020-01-31 DIAGNOSIS — K146 Glossodynia: Secondary | ICD-10-CM | POA: Insufficient documentation

## 2020-01-31 DIAGNOSIS — K766 Portal hypertension: Secondary | ICD-10-CM

## 2020-01-31 DIAGNOSIS — D638 Anemia in other chronic diseases classified elsewhere: Secondary | ICD-10-CM

## 2020-01-31 DIAGNOSIS — K7031 Alcoholic cirrhosis of liver with ascites: Secondary | ICD-10-CM | POA: Diagnosis not present

## 2020-01-31 HISTORY — DX: Polyneuropathy, unspecified: G62.9

## 2020-01-31 MED ORDER — MAGIC MOUTHWASH W/LIDOCAINE: ORAL | 0 refills | Status: DC

## 2020-01-31 NOTE — Progress Notes (Signed)
8                                                                                                                                                                                                                                                                                                                                                                                                                                                                                                                                                                                                                                                                                                                                                                                                                                                                                                                                                                                                                                                                                                                                                                                                                                                                                                                                                                                                                                                                                                                                                                                                                                                                                                                                                                                                                                                                                                                                                                                                                                                                                                                                                                                                                                                                                                                                                                                                                                                                                                                                                                                                                                                                                   0   Subjective:  Patient ID: Molly Parrish, female    DOB: 11-18-1965  Age: 54 y.o. MRN: [id number]  Chief Complaint  Patient presents with  . Hypertension  . Hypothyroidism    HPI  Patient states her blood pressure fluctuates her lowest b/p readings at home was 78/48 highest being 120/70's. At times she feels dizzy when she checks her b/p it is low.  Pt sees GI-Xifaxam is  to expensive but pt needs to maintain blood levels of ammonia -pt takes Lasix-2 BID and Aldactone BID for portal HTN. Pt with anemia-no current transfusion recommended -11/01/19-H-9.3. pt with normal EGD-needs colonoscopy on Friday.   Hypothyroid-synthroid managed by Endo-recent change  UTI-recurrent -pt finishing macrobid given by Dr. Tobie Poet  Depression/Anxiety  is managed by Dr. Phyllis Ginger mental health medications  Headaches-Topimax has reduced headache concerns  Patients sister requested advice with several questions/issues of concern: 1. Extreme Fatigue and Weakness 2. Light headed,dizziness 3. Low B/P 4. Severe leg and hand cramps 5. Low Blood sugar, increased thirst and appetite (craving tomato juice and sauce craving) Nausea 6. SOB 7. Feet going numb and sore toes 8. Sore and sensitive tongue Last Hemoglobin 7.4   Answered questions as:  1. Likely concern for anemia causing weakness 2.  & 3.Concern for low blood pressure-spread out medications -aldactone and lasix and always take with food and plenty of water. Avoid soda, coffee, tea and alcohol. 4. Continue to check sodium, potassium and magnesium. Consider VitaWater! 5.Regular meals plus snacks-Avoid high carbohydrate meals/snacks-consider adding protein to every meal and snacks. Calcium + vitamin D in dairy +protein and carbs= energy. Food such as yogurt, cream cheese, cottage cheese and milk. 6. SOB-likely due to anemia BUT if associated symptoms like cough increases or changes, consider additional work up. 7. Likely neuropathy. Lyrica is expensive and a controlled substance, Gabapentin causes dizziness and drowsiness and is  difficult to manage. I do not recommend either medication today for treatment. 8. Miracle mouth wash for irritated tongue-Avoid acidic foods.   Current Outpatient Medications on File Prior to Visit  Medication Sig Dispense Refill  . ALPRAZolam (XANAX) 1 MG tablet TAKE 1 TABLET BY MOUTH TWICE DAILY AS NEEDED FOR SEVERE ANXIETY 60 tablet 2  . calcitRIOL (ROCALTROL) 0.5 MCG capsule TAKE 2 CAPSULES BY MOUTH TWICE A DAY.    . ergocalciferol (VITAMIN D2) 1.25 MG (50000 UT) capsule Take by mouth.    . famotidine (PEPCID) 40 MG tablet     . furosemide (LASIX) 40 MG tablet TAKE 2 TABLETS BY MOUTH TWICE DAILY (Patient taking differently: 40 mg 2 (two) times daily. ) 180 tablet 1  . hydrOXYzine (VISTARIL) 25 MG capsule TAKE 1 CAPSULE(25 MG) BY MOUTH THREE TIMES DAILY 90 capsule 0  . Iron-FA-B Cmp-C-Biot-Probiotic (FUSION PLUS) CAPS     . lamoTRIgine (LAMICTAL) 200 MG tablet Take 1 at night    . levothyroxine (SYNTHROID) 200 MCG tablet Patient takes 1 tablets M-F and 1/2 Sat and Sun    . nitrofurantoin, macrocrystal-monohydrate, (MACROBID) 100 MG capsule TAKE 1 CAPSULE(100 MG) BY MOUTH TWICE DAILY 60 capsule 0  . potassium chloride SA (KLOR-CON) 20 MEQ tablet TAKE 4 TABLETS BY MOUTH TWICE DAILY 240 tablet 0  .  promethazine (PHENERGAN) 25 MG tablet TAKE 1 TABLET(25 MG) BY MOUTH FOUR TIMES DAILY AS NEEDED FOR NAUSEA OR VOMITING 40 tablet 2  . propranolol (INDERAL) 10 MG tablet Take by mouth.    . QUEtiapine (SEROQUEL) 50 MG tablet Take  2 at night    .  sertraline (ZOLOFT) 100 MG tablet Take 1 tab twice a day    . spironolactone (ALDACTONE) 50 MG tablet TAKE 1 TABLET BY MOUTH TWICE DAILY 60 tablet 3  . temazepam (RESTORIL) 30 MG capsule Take 1 to 2 at night    . topiramate ER (QUDEXY XR) 150 MG CS24 sprinkle capsule Take by mouth.    . traMADol (ULTRAM) 50 MG tablet TAKE 2 TABLETS BY MOUTH TWICE DAILY FOR BACK PAIN 120 tablet 1   No current facility-administered medications on file prior to visit.   Past Medical History:  Diagnosis Date  . Alcoholic cirrhosis of liver ([place])   . Bipolar disorder current episode depressed ([place])   . Generalized anxiety disorder   . Korsakoff syndrome ([place])   . Other osteoporosis without current pathological fracture   . Other specified hypothyroidism   . Portal hypertension (HCC)    Past Surgical History:  Procedure Laterality Date  . CATARACT EXTRACTION    . GASTRIC BYPASS    . HEMORRHOID SURGERY    . HERNIA REPAIR    . metatarsus abductus surgery Left   . PARTIAL HYSTERECTOMY    . THYROIDECTOMY      Family History  Problem Relation Age of Onset  . Cancer Mother   . Osteoporosis Mother    Social History   Socioeconomic History  . Marital status: Married    Spouse name: Not on file  . Number of children: Not on file  . Years of education: Not on file  . Highest education level: Not on file  Occupational History  . Not on file  Tobacco Use  . Smoking status: Never Smoker  . Smokeless tobacco: Never Used  Substance and Sexual Activity  . Alcohol use: Not Currently  . Drug use: Never  . Sexual activity: Yes  Other Topics Concern  . Not on file  Social History Narrative  . Not on file   Social Determinants of Health   Financial Resource  Strain:   . Difficulty of Paying Living Expenses:   Food Insecurity:   . Worried About Charity fundraiser in the Last Year:   . Arboriculturist in the Last Year:   Transportation Needs:   . Film/video editor (Medical):   Marland Kitchen Lack of Transportation (Non-Medical):   Physical Activity:   . Days of Exercise per Week:   . Minutes of Exercise per Session:   Stress:   . Feeling of Stress :   Social Connections:   . Frequency of Communication with Friends and Family:   . Frequency of Social Gatherings with Friends and Family:   . Attends Religious Services:   . Active Member of Clubs or Organizations:   . Attends Archivist Meetings:   Marland Kitchen Marital Status:     Review of Systems  Constitutional: Negative for chills, fatigue and fever.  HENT: Negative for congestion, ear pain, rhinorrhea and sore throat.   Respiratory: Positive for shortness of breath. Negative for cough.   Cardiovascular: Negative for chest pain.  Gastrointestinal: Negative for abdominal pain, constipation, diarrhea, nausea and vomiting.  Genitourinary: Negative for dysuria and urgency.  Musculoskeletal: Negative for back pain and myalgias.  Neurological: Positive for dizziness, numbness (BL feet and toes) and headaches. Negative for weakness and light-headedness.  Psychiatric/Behavioral: Negative for dysphoric mood. The patient is not nervous/anxious.      Objective:  BP 112/78   Pulse 97   Temp (!) 97.4 F (36.3 C)   Ht  5\' 6"  (1.676 m)   Wt 143 lb (64.9 kg)   SpO2 97%   BMI 23.08 kg/m   BP/Weight 01/31/2020 01/06/5092 08/25/7122  Systolic BP 580 998 90  Diastolic BP 78 64 50  Wt. (Lbs) 143 143 141  BMI 23.08 23.08 22.76    Physical Exam Vitals reviewed.  Constitutional:      Appearance: Normal appearance. She is normal weight.  Cardiovascular:     Rate and Rhythm: Normal rate and regular rhythm.     Pulses: Normal pulses.     Heart sounds: Normal heart sounds.  Pulmonary:     Effort:  Pulmonary effort is normal. No respiratory distress.     Breath sounds: Normal breath sounds.  Abdominal:     General: Abdomen is flat. Bowel sounds are normal.     Palpations: Abdomen is soft.     Tenderness: There is no abdominal tenderness.  Neurological:     Mental Status: She is alert and oriented to person, place, and time.  Psychiatric:        Mood and Affect: Mood normal.        Behavior: Behavior normal.      Lab Results  Component Value Date   WBC 8.8 11/14/2019   HGB 8.9 (L) 11/14/2019   HCT 29.0 (L) 11/14/2019   PLT 297 11/14/2019   GLUCOSE 67 11/14/2019   CHOL 142 11/01/2019   TRIG 81 11/01/2019   HDL 59 11/01/2019   LDLCALC 67 11/01/2019   ALT 18 11/14/2019   AST 16 11/14/2019   NA 140 11/14/2019   K 5.0 11/14/2019   CL 103 11/14/2019   CREATININE 1.09 (H) 11/14/2019   BUN 23 11/14/2019   CO2 22 11/14/2019   TSH 0.063 (L) 11/01/2019      Assessment & Plan:  1. Portal hypertension (HCC) Lasix and spirolactone-stable currently-concern hypotensive-spread out medications with snacks   2. Alcoholic cirrhosis of liver with ascites ([place]) Concern for inability to afford medications - Amb Referral to Clinical Pharmacist  3. Other specified hypothyroidism Endo managing medications  4. Anemia in other chronic diseases classified elsewhere GI following-likely source of fatigue  5. Neuropathy No meds , check labwork - Basic Metabolic Panel - Magnesium  6. Tongue pain - magic mouthwash w/lidocaine SOLN; 1 tsp Swish and spit before meals and bedtime. Avoid acidic foods.  Dispense: 120 mL; Refill: 0 Follow-up:  An After Visit Summary was printed and given to the patient. 45 minutes discussing history and answering questions presented by pt -exam/treatment options [place] [phone]

## 2020-01-31 NOTE — Patient Instructions (Addendum)
Take 1 lasix with breakfast.  Take 1 spironolactone with morning snack.  Take 1 lasix with lunch.  Take 1 spironolactone with evening snack. Contact Endo regarding your f/u appt due to your concerns with appetite.  Consider adding protein, dairy and carbs to daily meals and snacks. (Ex: Yogurt, Cream Cheese, Cottage Cheese, Milk) Avoid acidic foods (Pineapples, Tomatoes, Grapefruits etc)

## 2020-02-01 ENCOUNTER — Other Ambulatory Visit: Payer: Self-pay | Admitting: Family Medicine

## 2020-02-01 DIAGNOSIS — N289 Disorder of kidney and ureter, unspecified: Secondary | ICD-10-CM

## 2020-02-01 LAB — BASIC METABOLIC PANEL
BUN/Creatinine Ratio: 26 — ABNORMAL HIGH (ref 9–23)
BUN: 33 mg/dL — ABNORMAL HIGH (ref 6–24)
CO2: 23 mmol/L (ref 20–29)
Calcium: 7.5 mg/dL — ABNORMAL LOW (ref 8.7–10.2)
Chloride: 99 mmol/L (ref 96–106)
Creatinine, Ser: 1.26 mg/dL — ABNORMAL HIGH (ref 0.57–1.00)
GFR calc Af Amer: 55 mL/min/{1.73_m2} — ABNORMAL LOW (ref >59)
GFR calc non Af Amer: 48 mL/min/{1.73_m2} — ABNORMAL LOW (ref >59)
Glucose: 84 mg/dL (ref 65–99)
Potassium: 4.1 mmol/L (ref 3.5–5.2)
Sodium: 133 mmol/L — ABNORMAL LOW (ref 134–144)

## 2020-02-01 LAB — MAGNESIUM: Magnesium: 1.9 mg/dL (ref 1.6–2.3)

## 2020-02-02 ENCOUNTER — Telehealth: Payer: Self-pay

## 2020-02-02 ENCOUNTER — Other Ambulatory Visit: Payer: Self-pay

## 2020-02-02 MED ORDER — SPIRONOLACTONE 50 MG PO TABS: 50.0000 mg | ORAL_TABLET | Freq: Every day | ORAL | 0 refills | Status: DC

## 2020-02-02 NOTE — Telephone Encounter (Signed)
Molly Parrish called to confirm her dose of Lasix.  She is taking 20 mg 2 tabs. twice daily.

## 2020-02-04 ENCOUNTER — Other Ambulatory Visit: Payer: Self-pay | Admitting: Family Medicine

## 2020-02-05 DIAGNOSIS — G459 Transient cerebral ischemic attack, unspecified: Secondary | ICD-10-CM | POA: Diagnosis not present

## 2020-02-11 ENCOUNTER — Other Ambulatory Visit: Payer: Self-pay | Admitting: Physician Assistant

## 2020-02-13 ENCOUNTER — Ambulatory Visit (INDEPENDENT_AMBULATORY_CARE_PROVIDER_SITE_OTHER): Payer: 59 | Admitting: Family Medicine

## 2020-02-13 ENCOUNTER — Encounter: Payer: Self-pay | Admitting: Family Medicine

## 2020-02-13 ENCOUNTER — Other Ambulatory Visit: Payer: Self-pay

## 2020-02-13 VITALS — BP 116/70 | HR 90 | Temp 98.5°F | Ht 66.0 in | Wt 150.0 lb

## 2020-02-13 DIAGNOSIS — E871 Hypo-osmolality and hyponatremia: Secondary | ICD-10-CM

## 2020-02-13 DIAGNOSIS — D638 Anemia in other chronic diseases classified elsewhere: Secondary | ICD-10-CM | POA: Diagnosis not present

## 2020-02-13 DIAGNOSIS — E038 Other specified hypothyroidism: Secondary | ICD-10-CM | POA: Diagnosis not present

## 2020-02-13 DIAGNOSIS — D4861 Neoplasm of uncertain behavior of right breast: Secondary | ICD-10-CM

## 2020-02-13 DIAGNOSIS — G459 Transient cerebral ischemic attack, unspecified: Secondary | ICD-10-CM | POA: Diagnosis not present

## 2020-02-13 NOTE — Progress Notes (Signed)
Subjective:  Patient ID: Molly Parrish, female    DOB: Aug 24, 1965  Age: 54 y.o. MRN: [id number]  Chief Complaint  Patient presents with  . Hospitalization Follow-up    HPI Patient is a 54 year old white female who presents in follow-up from her hospitalization from February 04, 2020 to February 07, 2020 at [facility] for bilateral community-acquired pneumonia, chronic lower GI bleed, right cavitary breast nodule, and questionable TIA.  The patient presented to hospital with left sided numbness and tingling of her face.  CT did not show CVA.  Therefore the symptoms were thought to be either due to a TIA versus related to the acute anemia on chronic anemia.  Patient underwent a CTA of her head and neck, MRI of her brain, and 2D echo.  Neurology was consulted as well as speech, occupational, and physical therapy she did not have any SOB or chest pain but did however have bronchitis. She also received 2 units of packed RBCs due to hemoglobin being 6.9.  Her hemoglobin upon discharge was 9.1.  The patient was given Rocephin for pneumonia and discharged on Zithromax for 3 days, as well as the [facility] that she was previously on at home.  Pt has already had a endoscopy which was normal. Colonoscopy is scheduled for Aug 25th. Patient is feeling much better.  Current Outpatient Medications on File Prior to Visit  Medication Sig Dispense Refill  . ALPRAZolam (XANAX) 1 MG tablet TAKE 1 TABLET BY MOUTH TWICE DAILY AS NEEDED FOR SEVERE ANXIETY 60 tablet 2  . calcitRIOL (ROCALTROL) 0.5 MCG capsule TAKE 2 CAPSULES BY MOUTH TWICE A DAY.    . ergocalciferol (VITAMIN D2) 1.25 MG (50000 UT) capsule Take by mouth.    . famotidine (PEPCID) 40 MG tablet     . furosemide (LASIX) 40 MG tablet TAKE 2 TABLETS BY MOUTH TWICE DAILY 180 tablet 1  . hydrOXYzine (VISTARIL) 25 MG capsule TAKE 1 CAPSULE(25 MG) BY MOUTH THREE TIMES DAILY 90 capsule 0  . Iron-FA-B Cmp-C-Biot-Probiotic (FUSION PLUS) CAPS     . lamoTRIgine  (LAMICTAL) 200 MG tablet Take 1 at night    . levothyroxine (SYNTHROID) 200 MCG tablet Patient takes 1 tablets M-F and 1/2 Sat and Sun    . magic mouthwash w/lidocaine SOLN 1 tsp Swish and spit before meals and bedtime. Avoid acidic foods. 120 mL 0  . nitrofurantoin, macrocrystal-monohydrate, (MACROBID) 100 MG capsule TAKE 1 CAPSULE(100 MG) BY MOUTH TWICE DAILY 60 capsule 0  . potassium chloride SA (KLOR-CON) 20 MEQ tablet TAKE 4 TABLETS BY MOUTH TWICE DAILY 240 tablet 2  . promethazine (PHENERGAN) 25 MG tablet TAKE 1 TABLET(25 MG) BY MOUTH FOUR TIMES DAILY AS NEEDED FOR NAUSEA OR VOMITING 40 tablet 2  . propranolol (INDERAL) 10 MG tablet Take by mouth.    . QUEtiapine (SEROQUEL) 50 MG tablet Take  2 at night    . rifaximin (XIFAXAN) 550 MG TABS tablet Take 550 mg by mouth 2 (two) times daily.    . sertraline (ZOLOFT) 100 MG tablet Take 1 tab twice a day    . spironolactone (ALDACTONE) 50 MG tablet Take 1 tablet (50 mg total) by mouth daily. 30 tablet 0  . temazepam (RESTORIL) 30 MG capsule Take 1 to 2 at night    . topiramate ER (QUDEXY XR) 150 MG CS24 sprinkle capsule Take by mouth.    . traMADol (ULTRAM) 50 MG tablet TAKE 2 TABLETS BY MOUTH TWICE DAILY FOR BACK PAIN 120 tablet 1  No current facility-administered medications on file prior to visit.   Past Medical History:  Diagnosis Date  . Alcoholic cirrhosis of liver ([place])   . Bipolar disorder current episode depressed ([place])   . Generalized anxiety disorder   . Korsakoff syndrome ([place])   . Other osteoporosis without current pathological fracture   . Other specified hypothyroidism   . Portal hypertension (HCC)    Past Surgical History:  Procedure Laterality Date  . CATARACT EXTRACTION    . GASTRIC BYPASS    . HEMORRHOID SURGERY    . HERNIA REPAIR    . metatarsus abductus surgery Left   . PARTIAL HYSTERECTOMY    . THYROIDECTOMY      Family History  Problem Relation Age of Onset  . Cancer Mother   . Osteoporosis Mother     Social History   Socioeconomic History  . Marital status: Married    Spouse name: Not on file  . Number of children: Not on file  . Years of education: Not on file  . Highest education level: Not on file  Occupational History  . Not on file  Tobacco Use  . Smoking status: Never Smoker  . Smokeless tobacco: Never Used  Substance and Sexual Activity  . Alcohol use: Not Currently  . Drug use: Never  . Sexual activity: Yes  Other Topics Concern  . Not on file  Social History Narrative  . Not on file   Social Determinants of Health   Financial Resource Strain:   . Difficulty of Paying Living Expenses:   Food Insecurity:   . Worried About Charity fundraiser in the Last Year:   . Arboriculturist in the Last Year:   Transportation Needs:   . Film/video editor (Medical):   Marland Kitchen Lack of Transportation (Non-Medical):   Physical Activity:   . Days of Exercise per Week:   . Minutes of Exercise per Session:   Stress:   . Feeling of Stress :   Social Connections:   . Frequency of Communication with Friends and Family:   . Frequency of Social Gatherings with Friends and Family:   . Attends Religious Services:   . Active Member of Clubs or Organizations:   . Attends Archivist Meetings:   Marland Kitchen Marital Status:     Review of Systems  Constitutional: Positive for unexpected weight change (decreased.). Negative for chills, fatigue and fever.  HENT: Positive for congestion. Negative for ear pain and sore throat.   Eyes: Negative for visual disturbance.  Respiratory: Positive for cough and shortness of breath.   Cardiovascular: Negative for chest pain.  Gastrointestinal: Negative for abdominal pain, constipation, diarrhea, nausea and vomiting.  Endocrine: Positive for polydipsia and polyphagia. Negative for polyuria.  Genitourinary: Negative for dysuria and urgency.  Musculoskeletal: Positive for back pain. Negative for arthralgias and myalgias.  Skin: Negative for  rash.  Neurological: Positive for weakness. Negative for dizziness.  Psychiatric/Behavioral: Negative for dysphoric mood. The patient is not nervous/anxious.      Objective:  BP 116/70   Pulse 90   Temp 98.5 F (36.9 C)   Ht 5\' 6"  (1.676 m)   Wt 150 lb (68 kg)   SpO2 99%   BMI 24.21 kg/m   BP/Weight 02/13/2020 01/31/2020 08/31/9415  Systolic BP 408 144 818  Diastolic BP 70 78 64  Wt. (Lbs) 150 143 143  BMI 24.21 23.08 23.08    Physical Exam Vitals reviewed.  Constitutional:  Appearance: Normal appearance. She is normal weight.  Cardiovascular:     Rate and Rhythm: Normal rate and regular rhythm.     Heart sounds: Normal heart sounds.  Pulmonary:     Effort: Pulmonary effort is normal.     Breath sounds: Normal breath sounds.  Abdominal:     General: Bowel sounds are normal.     Palpations: Abdomen is soft.     Tenderness: There is no abdominal tenderness.  Neurological:     Mental Status: She is alert and oriented to person, place, and time.  Psychiatric:        Mood and Affect: Mood normal.        Behavior: Behavior normal.     Diabetic Foot Exam - Simple   No data filed       Lab Results  Component Value Date   WBC 14.0 (H) 02/13/2020   HGB 10.5 (L) 02/13/2020   HCT 31.6 (L) 02/13/2020   PLT 424 02/13/2020   GLUCOSE 68 02/13/2020   CHOL 142 11/01/2019   TRIG 81 11/01/2019   HDL 59 11/01/2019   LDLCALC 67 11/01/2019   ALT 25 02/13/2020   AST 23 02/13/2020   NA 137 02/13/2020   K 4.6 02/13/2020   CL 98 02/13/2020   CREATININE 1.03 (H) 02/13/2020   BUN 22 02/13/2020   CO2 25 02/13/2020   TSH 0.063 (L) 11/01/2019      Assessment & Plan:   1. Other specified hypothyroidism The current medical regimen is effective;  continue present plan and medications. 2. Hyponatremia - Comprehensive metabolic panel  3. Anemia in other chronic diseases classified elsewhere - CBC with Differential/Platelet Keep gi follow up.  4. Brain TIA -  Ambulatory referral to Neurology  5. 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 CBC with Differential/Platelet  . Comprehensive metabolic panel  . Ambulatory referral to Neurology     Follow-up: Return in about 4 weeks (around 03/12/2020).  An After Visit Summary was printed and given to the patient.  Rochel Brome [facility] [phone]

## 2020-02-14 ENCOUNTER — Other Ambulatory Visit: Payer: Self-pay | Admitting: Family Medicine

## 2020-02-14 ENCOUNTER — Telehealth: Payer: Self-pay

## 2020-02-14 LAB — CBC WITH DIFFERENTIAL/PLATELET
Basophils Absolute: 0.2 10*3/uL (ref 0.0–0.2)
Basos: 1 %
EOS (ABSOLUTE): 0.4 10*3/uL (ref 0.0–0.4)
Eos: 3 %
Hematocrit: 31.6 % — ABNORMAL LOW (ref 34.0–46.6)
Hemoglobin: 10.5 g/dL — ABNORMAL LOW (ref 11.1–15.9)
Immature Grans (Abs): 0.2 10*3/uL — ABNORMAL HIGH (ref 0.0–0.1)
Immature Granulocytes: 2 %
Lymphocytes Absolute: 2.5 10*3/uL (ref 0.7–3.1)
Lymphs: 18 %
MCH: 31.9 pg (ref 26.6–33.0)
MCHC: 33.2 g/dL (ref 31.5–35.7)
MCV: 96 fL (ref 79–97)
Monocytes Absolute: 0.9 10*3/uL (ref 0.1–0.9)
Monocytes: 6 %
Neutrophils Absolute: 9.9 10*3/uL — ABNORMAL HIGH (ref 1.4–7.0)
Neutrophils: 70 %
Platelets: 424 10*3/uL (ref 150–450)
RBC: 3.29 x10E6/uL — ABNORMAL LOW (ref 3.77–5.28)
RDW: 14.3 % (ref 11.7–15.4)
WBC: 14 10*3/uL — ABNORMAL HIGH (ref 3.4–10.8)

## 2020-02-14 LAB — COMPREHENSIVE METABOLIC PANEL
ALT: 25 IU/L (ref 0–32)
AST: 23 IU/L (ref 0–40)
Albumin/Globulin Ratio: 1.3 (ref 1.2–2.2)
Albumin: 3.1 g/dL — ABNORMAL LOW (ref 3.8–4.9)
Alkaline Phosphatase: 93 IU/L (ref 48–121)
BUN/Creatinine Ratio: 21 (ref 9–23)
BUN: 22 mg/dL (ref 6–24)
Bilirubin Total: 0.2 mg/dL (ref 0.0–1.2)
CO2: 25 mmol/L (ref 20–29)
Calcium: 8.4 mg/dL — ABNORMAL LOW (ref 8.7–10.2)
Chloride: 98 mmol/L (ref 96–106)
Creatinine, Ser: 1.03 mg/dL — ABNORMAL HIGH (ref 0.57–1.00)
GFR calc Af Amer: 70 mL/min/{1.73_m2} (ref >59)
GFR calc non Af Amer: 61 mL/min/{1.73_m2} (ref >59)
Globulin, Total: 2.3 g/dL (ref 1.5–4.5)
Glucose: 68 mg/dL (ref 65–99)
Potassium: 4.6 mmol/L (ref 3.5–5.2)
Sodium: 137 mmol/L (ref 134–144)
Total Protein: 5.4 g/dL — ABNORMAL LOW (ref 6.0–8.5)

## 2020-02-14 MED ORDER — TRIAMCINOLONE ACETONIDE 0.1 % MT PSTE: 1.0000 "application " | PASTE | Freq: Two times a day (BID) | OROMUCOSAL | 2 refills | Status: DC

## 2020-02-14 NOTE — Telephone Encounter (Signed)
Molly Parrish called to report that she is having burning of her tongue and it is super sensitive.  She has been using magic mouthwash with no relief.  Dr. Tobie Poet is going to send and dental paste to be used twice daily .

## 2020-02-15 ENCOUNTER — Other Ambulatory Visit: Payer: Medicare Other

## 2020-02-19 DIAGNOSIS — G459 Transient cerebral ischemic attack, unspecified: Secondary | ICD-10-CM

## 2020-02-19 DIAGNOSIS — D4861 Neoplasm of uncertain behavior of right breast: Secondary | ICD-10-CM | POA: Insufficient documentation

## 2020-02-19 DIAGNOSIS — E871 Hypo-osmolality and hyponatremia: Secondary | ICD-10-CM

## 2020-02-19 HISTORY — DX: Transient cerebral ischemic attack, unspecified: G45.9

## 2020-02-19 HISTORY — DX: Hypo-osmolality and hyponatremia: E87.1

## 2020-02-27 ENCOUNTER — Other Ambulatory Visit: Payer: Self-pay | Admitting: Family Medicine

## 2020-02-27 ENCOUNTER — Other Ambulatory Visit: Payer: Self-pay

## 2020-02-28 ENCOUNTER — Other Ambulatory Visit: Payer: 59

## 2020-02-28 ENCOUNTER — Other Ambulatory Visit: Payer: Self-pay

## 2020-02-28 DIAGNOSIS — R7989 Other specified abnormal findings of blood chemistry: Secondary | ICD-10-CM

## 2020-02-29 LAB — CBC WITH DIFFERENTIAL/PLATELET
Basophils Absolute: 0.2 10*3/uL (ref 0.0–0.2)
Basos: 1 %
EOS (ABSOLUTE): 0.5 10*3/uL — ABNORMAL HIGH (ref 0.0–0.4)
Eos: 3 %
Hematocrit: 38.9 % (ref 34.0–46.6)
Hemoglobin: 12.2 g/dL (ref 11.1–15.9)
Immature Grans (Abs): 0.1 10*3/uL (ref 0.0–0.1)
Immature Granulocytes: 1 %
Lymphocytes Absolute: 1.8 10*3/uL (ref 0.7–3.1)
Lymphs: 12 %
MCH: 30.7 pg (ref 26.6–33.0)
MCHC: 31.4 g/dL — ABNORMAL LOW (ref 31.5–35.7)
MCV: 98 fL — ABNORMAL HIGH (ref 79–97)
Monocytes Absolute: 1.3 10*3/uL — ABNORMAL HIGH (ref 0.1–0.9)
Monocytes: 9 %
Neutrophils Absolute: 10.9 10*3/uL — ABNORMAL HIGH (ref 1.4–7.0)
Neutrophils: 74 %
Platelets: 332 10*3/uL (ref 150–450)
RBC: 3.97 x10E6/uL (ref 3.77–5.28)
RDW: 13.4 % (ref 11.7–15.4)
WBC: 14.7 10*3/uL — ABNORMAL HIGH (ref 3.4–10.8)

## 2020-03-05 ENCOUNTER — Other Ambulatory Visit: Payer: Self-pay | Admitting: Family Medicine

## 2020-03-06 ENCOUNTER — Telehealth: Payer: Self-pay

## 2020-03-06 ENCOUNTER — Other Ambulatory Visit: Payer: Self-pay | Admitting: Family Medicine

## 2020-03-06 MED ORDER — NYSTATIN 100000 UNIT/ML MT SUSP: 5.0000 mL | Freq: Four times a day (QID) | OROMUCOSAL | 0 refills | Status: DC

## 2020-03-06 MED ORDER — FLUCONAZOLE 100 MG PO TABS
100.0000 mg | ORAL_TABLET | Freq: Every day | ORAL | 0 refills | Status: DC
Start: 2020-03-06 — End: 2020-04-16

## 2020-03-06 NOTE — Telephone Encounter (Signed)
Molly Parrish called requesting treatment for thrush.  She is scheduled to be seen in the office next week but is requesting diflucan and nystatin for the symptoms

## 2020-03-09 ENCOUNTER — Other Ambulatory Visit: Payer: Self-pay | Admitting: Physician Assistant

## 2020-03-10 ENCOUNTER — Other Ambulatory Visit: Payer: Self-pay | Admitting: Family Medicine

## 2020-03-11 ENCOUNTER — Ambulatory Visit (INDEPENDENT_AMBULATORY_CARE_PROVIDER_SITE_OTHER): Payer: 59 | Admitting: Family Medicine

## 2020-03-11 ENCOUNTER — Other Ambulatory Visit: Payer: Self-pay

## 2020-03-11 ENCOUNTER — Encounter: Payer: Self-pay | Admitting: Family Medicine

## 2020-03-11 ENCOUNTER — Other Ambulatory Visit: Payer: Self-pay | Admitting: Family Medicine

## 2020-03-11 VITALS — BP 94/50 | HR 116 | Temp 98.2°F | Resp 20 | Ht 66.0 in | Wt 158.4 lb

## 2020-03-11 DIAGNOSIS — R635 Abnormal weight gain: Secondary | ICD-10-CM | POA: Diagnosis not present

## 2020-03-11 DIAGNOSIS — D72829 Elevated white blood cell count, unspecified: Secondary | ICD-10-CM

## 2020-03-11 DIAGNOSIS — R2689 Other abnormalities of gait and mobility: Secondary | ICD-10-CM

## 2020-03-11 DIAGNOSIS — I9589 Other hypotension: Secondary | ICD-10-CM | POA: Diagnosis not present

## 2020-03-11 NOTE — Progress Notes (Signed)
Subjective:  Patient ID: Molly Parrish, female    DOB: 03-Dec-1965  Age: 54 y.o. MRN: [id number]  Chief Complaint  Patient presents with  . Follow-up    HPI Patient continues to complain weakness.  She has significant fatigue.  She has had an increase in her weight over the last 3 months of approximately 15 pounds.  She reports excessive thirst and some nausea which she feels is related to her gastric bypass.  Her most recent blood work that showed an elevated white blood cell count without any evidence of infection.  Her anemia has improved.  Her blood pressure at home runs 90-100/60-70.  The only medication she currently takes that would affect her blood pressure are spironolactone, Lasix and propranolol which she has been on for years.  These are used to treat her ascites related to her liver cirrhosis as well as her portal hypertension.  Current Outpatient Medications on File Prior to Visit  Medication Sig Dispense Refill  . ALPRAZolam (XANAX) 1 MG tablet TAKE 1 TABLET BY MOUTH TWICE DAILY AS NEEDED FOR SEVERE ANXIETY 60 tablet 2  . calcitRIOL (ROCALTROL) 0.5 MCG capsule TAKE 2 CAPSULES BY MOUTH TWICE A DAY.    . ergocalciferol (VITAMIN D2) 1.25 MG (50000 UT) capsule Take by mouth.    . famotidine (PEPCID) 40 MG tablet     . fluconazole (DIFLUCAN) 100 MG tablet Take 1 tablet (100 mg total) by mouth daily. 7 tablet 0  . furosemide (LASIX) 40 MG tablet TAKE 2 TABLETS BY MOUTH TWICE DAILY 180 tablet 1  . hydrOXYzine (VISTARIL) 25 MG capsule TAKE 1 CAPSULE(25 MG) BY MOUTH THREE TIMES DAILY 90 capsule 0  . Iron-FA-B Cmp-C-Biot-Probiotic (FUSION PLUS) CAPS Take by mouth 2 (two) times daily.     Marland Kitchen lamoTRIgine (LAMICTAL) 200 MG tablet Take 1 at night    . levothyroxine (SYNTHROID) 200 MCG tablet Patient takes 1 tablets M-F and 1/2 Sat and Sun    . nystatin (MYCOSTATIN) 100000 UNIT/ML suspension Take 5 mLs (500,000 Units total) by mouth 4 (four) times daily for 7 days. 473 mL 0  . potassium  chloride SA (KLOR-CON) 20 MEQ tablet TAKE 4 TABLETS BY MOUTH TWICE DAILY 240 tablet 2  . promethazine (PHENERGAN) 25 MG tablet TAKE 1 TABLET(25 MG) BY MOUTH FOUR TIMES DAILY AS NEEDED FOR NAUSEA OR VOMITING 40 tablet 2  . propranolol (INDERAL) 10 MG tablet Take by mouth.    . QUEtiapine (SEROQUEL) 50 MG tablet Take  2 at night    . rifaximin (XIFAXAN) 550 MG TABS tablet Take 550 mg by mouth 2 (two) times daily.    . sertraline (ZOLOFT) 100 MG tablet Take 1 tab twice a day    . spironolactone (ALDACTONE) 50 MG tablet Take 1 tablet (50 mg total) by mouth daily. (Patient taking differently: Take 50 mg by mouth 2 (two) times daily. ) 30 tablet 0  . temazepam (RESTORIL) 30 MG capsule Take 1 to 2 at night    . topiramate ER (QUDEXY XR) 150 MG CS24 sprinkle capsule Take by mouth.    . triamcinolone (KENALOG) 0.1 % paste APPLY TO THE AFFECTED AREA TWICE DAILY AS DIRECTED 5 g 2   No current facility-administered medications on file prior to visit.   Past Medical History:  Diagnosis Date  . Alcoholic cirrhosis of liver ([place])   . Bipolar disorder current episode depressed ([place])   . Generalized anxiety disorder   . Korsakoff syndrome ([place])   . Other  osteoporosis without current pathological fracture   . Other specified hypothyroidism   . Portal hypertension ([facility])    Past Surgical History:  Procedure Laterality Date  . CATARACT EXTRACTION    . GASTRIC BYPASS    . HEMORRHOID SURGERY    . HERNIA REPAIR    . metatarsus abductus surgery Left   . PARTIAL HYSTERECTOMY    . THYROIDECTOMY      Family History  Problem Relation Age of Onset  . Cancer Mother   . Osteoporosis Mother    Social History   Socioeconomic History  . Marital status: Married    Spouse name: Not on file  . Number of children: Not on file  . Years of education: Not on file  . Highest education level: Not on file  Occupational History  . Not on file  Tobacco Use  . Smoking status: Never Smoker  . Smokeless tobacco:  Never Used  Substance and Sexual Activity  . Alcohol use: Not Currently  . Drug use: Never  . Sexual activity: Yes  Other Topics Concern  . Not on file  Social History Narrative  . Not on file   Social Determinants of Health   Financial Resource Strain:   . Difficulty of Paying Living Expenses: Not on file  Food Insecurity:   . Worried About Charity fundraiser in the Last Year: Not on file  . Ran Out of Food in the Last Year: Not on file  Transportation Needs:   . Lack of Transportation (Medical): Not on file  . Lack of Transportation (Non-Medical): Not on file  Physical Activity:   . Days of Exercise per Week: Not on file  . Minutes of Exercise per Session: Not on file  Stress:   . Feeling of Stress : Not on file  Social Connections:   . Frequency of Communication with Friends and Family: Not on file  . Frequency of Social Gatherings with Friends and Family: Not on file  . Attends Religious Services: Not on file  . Active Member of Clubs or Organizations: Not on file  . Attends Archivist Meetings: Not on file  . Marital Status: Not on file    Review of Systems  Constitutional: Positive for unexpected weight change (increased over the last 3 month from 144 to 158.  Pt has not changed her activity or her eating habits. ). Negative for chills, fatigue and fever.  HENT: Positive for congestion and ear pain (left only. for 2 weeks. ). Negative for rhinorrhea and sore throat.   Eyes: Positive for visual disturbance (difficulty focusing. made an appointment with her eye doctor. ).  Respiratory: Positive for shortness of breath (with exertion). Negative for cough.   Cardiovascular: Negative for chest pain.  Gastrointestinal: Positive for nausea (due to gastric bypass. ). Negative for abdominal pain, constipation, diarrhea and vomiting.  Endocrine: Positive for polyphagia. Negative for polydipsia and polyuria.  Genitourinary: Negative for dysuria and urgency.    Musculoskeletal: Positive for back pain (lumbar - for years. No changes.). Negative for arthralgias and myalgias.  Skin: Negative for rash.  Neurological: Positive for weakness. Negative for dizziness and headaches.  Psychiatric/Behavioral: Positive for dysphoric mood. The patient is not nervous/anxious.      Objective:  BP (!) 94/50   Pulse (!) 116   Temp 98.2 F (36.8 C)   Resp 20   Ht 5\' 6"  (1.676 m)   Wt 158 lb 6.4 oz (71.8 kg)   BMI 25.57 kg/m  BP/Weight 03/11/2020 02/13/2020 9/56/3875  Systolic BP 94 643 329  Diastolic BP 50 70 78  Wt. (Lbs) 158.4 150 143  BMI 25.57 24.21 23.08    Physical Exam Vitals reviewed.  Constitutional:      General: She is not in acute distress.    Appearance: Normal appearance. She is obese.  HENT:     Right Ear: Tympanic membrane and ear canal normal.     Left Ear: Tympanic membrane and ear canal normal.     Nose: Nose normal. No congestion or rhinorrhea.  Eyes:     Conjunctiva/sclera: Conjunctivae normal.  Neck:     Thyroid: No thyroid mass.  Cardiovascular:     Rate and Rhythm: Normal rate and regular rhythm.     Pulses: Normal pulses.     Heart sounds: No murmur heard.   Pulmonary:     Effort: Pulmonary effort is normal.     Breath sounds: Normal breath sounds.  Abdominal:     General: Bowel sounds are normal.     Palpations: Abdomen is soft. There is no mass.     Tenderness: There is no abdominal tenderness.  Musculoskeletal:        General: Normal range of motion.     Right lower leg: Edema present.     Left lower leg: Edema present.  Lymphadenopathy:     Cervical: No cervical adenopathy.  Skin:    General: Skin is warm and dry.  Neurological:     Mental Status: She is alert and oriented to person, place, and time.     Cranial Nerves: No cranial nerve deficit.  Psychiatric:        Mood and Affect: Mood normal.        Behavior: Behavior normal.     Diabetic Foot Exam - Simple   No data filed       Lab  Results  Component Value Date   WBC 14.7 (H) 02/28/2020   HGB 12.2 02/28/2020   HCT 38.9 02/28/2020   PLT 332 02/28/2020   GLUCOSE 68 02/13/2020   CHOL 142 11/01/2019   TRIG 81 11/01/2019   HDL 59 11/01/2019   LDLCALC 67 11/01/2019   ALT 25 02/13/2020   AST 23 02/13/2020   NA 137 02/13/2020   K 4.6 02/13/2020   CL 98 02/13/2020   CREATININE 1.03 (H) 02/13/2020   BUN 22 02/13/2020   CO2 25 02/13/2020   TSH 0.063 (L) 11/01/2019      Assessment & Plan:   1. Leukocytosis, unspecified type - CBC with Differential/Platelet  2. Weight gain Unclear etiology.  3. Other specified hypotension Decrease spironolactone to 50 mg once daily in a.m.  Continue Lasix 40 mg to twice daily.  Continue propranolol 10 mg once daily as its a very low dose. Continue to check blood pressure daily. - Comprehensive metabolic panel  4. Imbalance/weakness Patient to discuss with neurology.  No orders of the defined types were placed in this encounter.   Orders Placed This Encounter  Procedures  . CBC with Differential/Platelet  . Comprehensive metabolic panel     Follow-up: No follow-ups on file.  An After Visit Summary was printed and given to the patient.  Rochel Brome [facility] [phone]

## 2020-03-11 NOTE — Patient Instructions (Addendum)
Decrease spironlactone to 50 mg once daily in am.  Continue lasix 40 mg 2 twice a day.  Continue propranol.

## 2020-03-12 LAB — COMPREHENSIVE METABOLIC PANEL
ALT: 29 IU/L (ref 0–32)
AST: 27 IU/L (ref 0–40)
Albumin/Globulin Ratio: 1.6 (ref 1.2–2.2)
Albumin: 2.9 g/dL — ABNORMAL LOW (ref 3.8–4.9)
Alkaline Phosphatase: 101 IU/L (ref 48–121)
BUN/Creatinine Ratio: 17 (ref 9–23)
BUN: 20 mg/dL (ref 6–24)
Bilirubin Total: 1 mg/dL (ref 0.0–1.2)
CO2: 27 mmol/L (ref 20–29)
Calcium: 7.4 mg/dL — ABNORMAL LOW (ref 8.7–10.2)
Chloride: 100 mmol/L (ref 96–106)
Creatinine, Ser: 1.15 mg/dL — ABNORMAL HIGH (ref 0.57–1.00)
GFR calc Af Amer: 61 mL/min/{1.73_m2} (ref >59)
GFR calc non Af Amer: 53 mL/min/{1.73_m2} — ABNORMAL LOW (ref >59)
Globulin, Total: 1.8 g/dL (ref 1.5–4.5)
Glucose: 46 mg/dL — ABNORMAL LOW (ref 65–99)
Potassium: 4.2 mmol/L (ref 3.5–5.2)
Sodium: 138 mmol/L (ref 134–144)
Total Protein: 4.7 g/dL — ABNORMAL LOW (ref 6.0–8.5)

## 2020-03-12 LAB — CBC WITH DIFFERENTIAL/PLATELET
Basophils Absolute: 0.1 10*3/uL (ref 0.0–0.2)
Basos: 1 %
EOS (ABSOLUTE): 0.5 10*3/uL — ABNORMAL HIGH (ref 0.0–0.4)
Eos: 3 %
Hematocrit: 27.2 % — ABNORMAL LOW (ref 34.0–46.6)
Hemoglobin: 9.2 g/dL — ABNORMAL LOW (ref 11.1–15.9)
Immature Grans (Abs): 0.3 10*3/uL — ABNORMAL HIGH (ref 0.0–0.1)
Immature Granulocytes: 2 %
Lymphocytes Absolute: 2.8 10*3/uL (ref 0.7–3.1)
Lymphs: 20 %
MCH: 31.3 pg (ref 26.6–33.0)
MCHC: 33.8 g/dL (ref 31.5–35.7)
MCV: 93 fL (ref 79–97)
Monocytes Absolute: 1 10*3/uL — ABNORMAL HIGH (ref 0.1–0.9)
Monocytes: 7 %
Neutrophils Absolute: 9.7 10*3/uL — ABNORMAL HIGH (ref 1.4–7.0)
Neutrophils: 67 %
Platelets: 245 10*3/uL (ref 150–450)
RBC: 2.94 x10E6/uL — ABNORMAL LOW (ref 3.77–5.28)
RDW: 13.6 % (ref 11.7–15.4)
WBC: 14.4 10*3/uL — ABNORMAL HIGH (ref 3.4–10.8)

## 2020-03-13 ENCOUNTER — Other Ambulatory Visit: Payer: Self-pay

## 2020-03-13 ENCOUNTER — Ambulatory Visit: Payer: 59

## 2020-03-13 DIAGNOSIS — D72829 Elevated white blood cell count, unspecified: Secondary | ICD-10-CM

## 2020-03-14 LAB — COMPREHENSIVE METABOLIC PANEL
ALT: 24 IU/L (ref 0–32)
AST: 31 IU/L (ref 0–40)
Albumin/Globulin Ratio: 1.5 (ref 1.2–2.2)
Albumin: 2.6 g/dL — ABNORMAL LOW (ref 3.8–4.9)
Alkaline Phosphatase: 88 IU/L (ref 48–121)
BUN/Creatinine Ratio: 5 — ABNORMAL LOW (ref 9–23)
BUN: 6 mg/dL (ref 6–24)
Bilirubin Total: 0.7 mg/dL (ref 0.0–1.2)
CO2: 24 mmol/L (ref 20–29)
Calcium: 7 mg/dL — ABNORMAL LOW (ref 8.7–10.2)
Chloride: 98 mmol/L (ref 96–106)
Creatinine, Ser: 1.11 mg/dL — ABNORMAL HIGH (ref 0.57–1.00)
GFR calc Af Amer: 64 mL/min/{1.73_m2} (ref >59)
GFR calc non Af Amer: 56 mL/min/{1.73_m2} — ABNORMAL LOW (ref >59)
Globulin, Total: 1.7 g/dL (ref 1.5–4.5)
Glucose: 132 mg/dL — ABNORMAL HIGH (ref 65–99)
Potassium: 3.7 mmol/L (ref 3.5–5.2)
Sodium: 135 mmol/L (ref 134–144)
Total Protein: 4.3 g/dL — CL (ref 6.0–8.5)

## 2020-03-14 LAB — CBC
Hematocrit: 24.7 % — ABNORMAL LOW (ref 34.0–46.6)
Hemoglobin: 7.8 g/dL — ABNORMAL LOW (ref 11.1–15.9)
MCH: 30 pg (ref 26.6–33.0)
MCHC: 31.6 g/dL (ref 31.5–35.7)
MCV: 95 fL (ref 79–97)
Platelets: 235 10*3/uL (ref 150–450)
RBC: 2.6 x10E6/uL — CL (ref 3.77–5.28)
RDW: 14.3 % (ref 11.7–15.4)
WBC: 12.8 10*3/uL — ABNORMAL HIGH (ref 3.4–10.8)

## 2020-03-15 ENCOUNTER — Telehealth: Payer: Self-pay

## 2020-03-15 NOTE — Telephone Encounter (Addendum)
Molly Parrish called to report that she has gained 5 lbs today and her abdomen is swollen.  She concerned that this is ascites.  Her bp is 98/59 this morning.  Dr. Tobie Poet advised that she call [name] at [facility] for further instructions. Jacinta verbalizes understanding and will call their office for instructions.

## 2020-03-21 ENCOUNTER — Other Ambulatory Visit: Payer: Self-pay | Admitting: Family Medicine

## 2020-03-26 ENCOUNTER — Ambulatory Visit (INDEPENDENT_AMBULATORY_CARE_PROVIDER_SITE_OTHER): Payer: 59 | Admitting: Legal Medicine

## 2020-03-26 ENCOUNTER — Encounter: Payer: Self-pay | Admitting: Legal Medicine

## 2020-03-26 ENCOUNTER — Other Ambulatory Visit: Payer: Self-pay

## 2020-03-26 ENCOUNTER — Encounter: Payer: Self-pay | Admitting: Family Medicine

## 2020-03-26 VITALS — BP 110/64 | HR 88 | Temp 97.3°F | Resp 18 | Ht 66.0 in | Wt 164.0 lb

## 2020-03-26 DIAGNOSIS — K7031 Alcoholic cirrhosis of liver with ascites: Secondary | ICD-10-CM

## 2020-03-26 DIAGNOSIS — D638 Anemia in other chronic diseases classified elsewhere: Secondary | ICD-10-CM

## 2020-03-26 DIAGNOSIS — D72829 Elevated white blood cell count, unspecified: Secondary | ICD-10-CM | POA: Diagnosis not present

## 2020-03-26 LAB — POCT URINALYSIS DIPSTICK
Bilirubin, UA: NEGATIVE
Glucose, UA: NEGATIVE
Ketones, UA: NEGATIVE
Leukocytes, UA: NEGATIVE
Nitrite, UA: NEGATIVE
Protein, UA: NEGATIVE
Spec Grav, UA: <=1.005 — AB (ref 1.010–1.025)
Urobilinogen, UA: NEGATIVE E.U./dL — AB
pH, UA: 7 (ref 5.0–8.0)

## 2020-03-26 LAB — LAB REPORT - SCANNED
Ammonia: <9
HCT: 26 — AB (ref 29–41)
Hemoglobin: 8.4
MCH: 33.3
MCV: 101 (ref 76–111)
Platelet: 284
RBC: 2.53
Retic Count, Absolute: 6
WBC: 10.4

## 2020-03-26 NOTE — Progress Notes (Signed)
Subjective:  Patient ID: Molly Parrish, female    DOB: May 12, 1966  Age: 54 y.o. MRN: [id number]  Chief Complaint  Patient presents with  . Dizziness  . Fall    HPI: severe anemia last hemoglobin 2 weeks ago 7.8, hct 24.7.  She has EGD and colonoscopy and no source of blood loss found.  She has no hematuria.  She is bruising easily spontaneously.  No hemoptysis.   Current Outpatient Medications on File Prior to Visit  Medication Sig Dispense Refill  . ALPRAZolam (XANAX) 1 MG tablet TAKE 1 TABLET BY MOUTH TWICE DAILY AS NEEDED FOR SEVERE ANXIETY 60 tablet 2  . calcitRIOL (ROCALTROL) 0.5 MCG capsule TAKE 2 CAPSULES BY MOUTH TWICE A DAY.    . ergocalciferol (VITAMIN D2) 1.25 MG (50000 UT) capsule Take by mouth.    . famotidine (PEPCID) 40 MG tablet     . fluconazole (DIFLUCAN) 100 MG tablet Take 1 tablet (100 mg total) by mouth daily. 7 tablet 0  . furosemide (LASIX) 40 MG tablet TAKE 2 TABLETS BY MOUTH TWICE DAILY 180 tablet 1  . hydrOXYzine (VISTARIL) 25 MG capsule TAKE 1 CAPSULE(25 MG) BY MOUTH THREE TIMES DAILY 90 capsule 0  . Iron-FA-B Cmp-C-Biot-Probiotic (FUSION PLUS) CAPS Take by mouth 2 (two) times daily.     Marland Kitchen lamoTRIgine (LAMICTAL) 200 MG tablet Take 1 at night    . levothyroxine (SYNTHROID) 200 MCG tablet Patient takes 1 tablets M-F and 1/2 Sat and Sun    . potassium chloride SA (KLOR-CON) 20 MEQ tablet TAKE 4 TABLETS BY MOUTH TWICE DAILY 240 tablet 2  . promethazine (PHENERGAN) 25 MG tablet TAKE 1 TABLET(25 MG) BY MOUTH FOUR TIMES DAILY AS NEEDED FOR NAUSEA OR VOMITING 40 tablet 2  . propranolol (INDERAL) 10 MG tablet Take by mouth.    . QUEtiapine (SEROQUEL) 50 MG tablet Take  2 at night    . rifaximin (XIFAXAN) 550 MG TABS tablet Take 550 mg by mouth 2 (two) times daily.    . sertraline (ZOLOFT) 100 MG tablet Take 1 tab twice a day    . spironolactone (ALDACTONE) 50 MG tablet Take 1 tablet (50 mg total) by mouth daily. (Patient taking differently: Take 50 mg by mouth 2  (two) times daily. ) 30 tablet 0  . temazepam (RESTORIL) 30 MG capsule Take 1 to 2 at night    . topiramate ER (QUDEXY XR) 150 MG CS24 sprinkle capsule Take by mouth.    . traMADol (ULTRAM) 50 MG tablet TAKE 2 TABLETS BY MOUTH TWICE DAILY FOR BACK PAIN 120 tablet 1  . triamcinolone (KENALOG) 0.1 % paste APPLY TO THE AFFECTED AREA TWICE DAILY AS DIRECTED 5 g 2   No current facility-administered medications on file prior to visit.   Past Medical History:  Diagnosis Date  . Alcoholic cirrhosis of liver ([place])   . Bipolar disorder current episode depressed ([place])   . Generalized anxiety disorder   . Korsakoff syndrome ([place])   . Other osteoporosis without current pathological fracture   . Other specified hypothyroidism   . Portal hypertension (H[facility])    Past Surgical History:  Procedure Laterality Date  . CATARACT EXTRACTION    . GASTRIC BYPASS    . HEMORRHOID SURGERY    . HERNIA REPAIR    . metatarsus abductus surgery Left   . PARTIAL HYSTERECTOMY    . THYROIDECTOMY      Family History  Problem Relation Age of Onset  . Cancer Mother   .  Osteoporosis Mother    Social History   Socioeconomic History  . Marital status: Married    Spouse name: Not on file  . Number of children: Not on file  . Years of education: Not on file  . Highest education level: Not on file  Occupational History  . Not on file  Tobacco Use  . Smoking status: Never Smoker  . Smokeless tobacco: Never Used  Substance and Sexual Activity  . Alcohol use: Not Currently  . Drug use: Never  . Sexual activity: Yes  Other Topics Concern  . Not on file  Social History Narrative  . Not on file   Social Determinants of Health   Financial Resource Strain:   . Difficulty of Paying Living Expenses: Not on file  Food Insecurity:   . Worried About Charity fundraiser in the Last Year: Not on file  . Ran Out of Food in the Last Year: Not on file  Transportation Needs:   . Lack of Transportation (Medical): Not on  file  . Lack of Transportation (Non-Medical): Not on file  Physical Activity:   . Days of Exercise per Week: Not on file  . Minutes of Exercise per Session: Not on file  Stress:   . Feeling of Stress : Not on file  Social Connections:   . Frequency of Communication with Friends and Family: Not on file  . Frequency of Social Gatherings with Friends and Family: Not on file  . Attends Religious Services: Not on file  . Active Member of Clubs or Organizations: Not on file  . Attends Archivist Meetings: Not on file  . Marital Status: Not on file    Review of Systems  Constitutional: Negative.   HENT: Negative.   Eyes: Negative.   Respiratory: Negative.   Cardiovascular: Negative.   Gastrointestinal: Negative.   Genitourinary: Positive for difficulty urinating.  Neurological: Positive for weakness.  Hematological: Bruises/bleeds easily.     Objective:  BP 110/64   Pulse 88   Temp (!) 97.3 F (36.3 C)   Resp 18   Ht 5\' 6"  (1.676 m)   Wt 164 lb (74.4 kg)   SpO2 91%   BMI 26.47 kg/m   BP/Weight 03/26/2020 03/11/2020 2/42/6834  Systolic BP 196 94 222  Diastolic BP 64 50 70  Wt. (Lbs) 164 158.4 150  BMI 26.47 25.57 24.21    Physical Exam Vitals reviewed.  Constitutional:      Appearance: Normal appearance.  HENT:     Head: Normocephalic and atraumatic.     Right Ear: Tympanic membrane, ear canal and external ear normal.     Left Ear: Tympanic membrane, ear canal and external ear normal.  Eyes:     Comments: Pale conjunctiva  Cardiovascular:     Rate and Rhythm: Normal rate and regular rhythm.     Pulses: Normal pulses.     Heart sounds: Normal heart sounds.  Pulmonary:     Effort: Pulmonary effort is normal.     Breath sounds: Normal breath sounds.  Abdominal:     General: Abdomen is flat. Bowel sounds are normal.     Palpations: Abdomen is soft.     Comments: No liver edge  Neurological:     General: No focal deficit present.     Mental Status: She  is alert and oriented to person, place, and time.       Lab Results  Component Value Date   WBC 12.8 (H) 03/13/2020  HGB 7.8 (L) 03/13/2020   HCT 24.7 (L) 03/13/2020   PLT 235 03/13/2020   GLUCOSE 132 (H) 03/13/2020   CHOL 142 11/01/2019   TRIG 81 11/01/2019   HDL 59 11/01/2019   LDLCALC 67 11/01/2019   ALT 24 03/13/2020   AST 31 03/13/2020   NA 135 03/13/2020   K 3.7 03/13/2020   CL 98 03/13/2020   CREATININE 1.11 (H) 03/13/2020   BUN 6 03/13/2020   CO2 24 03/13/2020   TSH 0.063 (L) 11/01/2019      Assessment & Plan:   1. Leukocytosis, unspecified type - POCT urinalysis dipstick Probably due to liver disease  2. Alcoholic cirrhosis of liver with ascites (HCC) This is stable and ammonia level < 9.0  3. Anemia in other chronic diseases classified elsewhere Hemoglobin 8.4, up from 7.8.  Reticulocyte 6.0 good      Orders Placed This Encounter  Procedures  . POCT urinalysis dipstick     Follow-up: Return in about 1 week (around 04/02/2020) for anemia.  An After Visit Summary was printed and given to the patient.  [place] [phone]

## 2020-03-30 ENCOUNTER — Other Ambulatory Visit: Payer: Self-pay | Admitting: Family Medicine

## 2020-04-10 ENCOUNTER — Other Ambulatory Visit: Payer: Self-pay | Admitting: Physician Assistant

## 2020-04-10 ENCOUNTER — Encounter: Payer: Self-pay | Admitting: Neurology

## 2020-04-15 NOTE — Progress Notes (Signed)
Subjective:  Patient ID: Molly Parrish, female    DOB: 24-Jan-1966  Age: 54 y.o. MRN: [id number]  Chief Complaint  Patient presents with  . Hypotension    HPI  Patient is here to follow up weakness, fatigue. Her strength and balance is off. She is t risk for falls. She is complaining of shortness of breath. Her most recent blood work that showed an elevated white blood cell count without any evidence of infection.  Her anemia has improved.  Her blood pressure at home runs 90-100s/60-70s.   These are used to treat her ascites related to her liver cirrhosis as well as her portal hypertension. She is on propranolol, lasix, and spironolactone.  Current Outpatient Medications on File Prior to Visit  Medication Sig Dispense Refill  . ALPRAZolam (XANAX) 1 MG tablet TAKE 1 TABLET BY MOUTH TWICE DAILY AS NEEDED FOR SEVERE ANXIETY 60 tablet 2  . calcitRIOL (ROCALTROL) 0.5 MCG capsule TAKE 2 CAPSULES BY MOUTH TWICE A DAY.    . ergocalciferol (VITAMIN D2) 1.25 MG (50000 UT) capsule Take by mouth.    . famotidine (PEPCID) 40 MG tablet     . furosemide (LASIX) 40 MG tablet TAKE 2 TABLETS BY MOUTH TWICE DAILY 180 tablet 1  . hydrOXYzine (VISTARIL) 25 MG capsule TAKE 1 CAPSULE(25 MG) BY MOUTH THREE TIMES DAILY 90 capsule 3  . Iron-FA-B Cmp-C-Biot-Probiotic (FUSION PLUS) CAPS Take by mouth 2 (two) times daily.     Marland Kitchen lamoTRIgine (LAMICTAL) 200 MG tablet Take 1 at night    . levothyroxine (SYNTHROID) 200 MCG tablet Patient takes 1 tablets M-F and 1/2 Sat and Sun    . nitrofurantoin, macrocrystal-monohydrate, (MACROBID) 100 MG capsule TAKE 1 CAPSULE(100 MG) BY MOUTH TWICE DAILY 60 capsule 0  . potassium chloride SA (KLOR-CON) 20 MEQ tablet TAKE 4 TABLETS BY MOUTH TWICE DAILY 240 tablet 2  . promethazine (PHENERGAN) 25 MG tablet TAKE 1 TABLET(25 MG) BY MOUTH FOUR TIMES DAILY AS NEEDED FOR NAUSEA OR VOMITING 40 tablet 2  . propranolol (INDERAL) 10 MG tablet Take by mouth.    . QUEtiapine (SEROQUEL) 50 MG  tablet Take  2 at night    . rifaximin (XIFAXAN) 550 MG TABS tablet Take 550 mg by mouth 2 (two) times daily.    . sertraline (ZOLOFT) 100 MG tablet Take 1 tab twice a day    . spironolactone (ALDACTONE) 50 MG tablet Take 1 tablet (50 mg total) by mouth daily. (Patient taking differently: Take 100 mg by mouth 2 (two) times daily. ) 30 tablet 0  . temazepam (RESTORIL) 30 MG capsule Take 1 to 2 at night    . topiramate ER (QUDEXY XR) 150 MG CS24 sprinkle capsule Take by mouth.    . traMADol (ULTRAM) 50 MG tablet TAKE 2 TABLETS BY MOUTH TWICE DAILY FOR BACK PAIN 120 tablet 1  . triamcinolone (KENALOG) 0.1 % paste APPLY TO THE AFFECTED AREA TWICE DAILY AS DIRECTED 5 g 2   No current facility-administered medications on file prior to visit.   Past Medical History:  Diagnosis Date  . Alcoholic cirrhosis of liver ([place])   . Bipolar disorder current episode depressed ([place])   . Generalized anxiety disorder   . Korsakoff syndrome ([place])   . Other osteoporosis without current pathological fracture   . Other specified hypothyroidism   . Portal hypertension ([facility])    Past Surgical History:  Procedure Laterality Date  . CATARACT EXTRACTION    . GASTRIC BYPASS    .  HEMORRHOID SURGERY    . HERNIA REPAIR    . metatarsus abductus surgery Left   . PARTIAL HYSTERECTOMY    . THYROIDECTOMY      Family History  Problem Relation Age of Onset  . Cancer Mother   . Osteoporosis Mother    Social History   Socioeconomic History  . Marital status: Married    Spouse name: Not on file  . Number of children: Not on file  . Years of education: Not on file  . Highest education level: Not on file  Occupational History  . Not on file  Tobacco Use  . Smoking status: Never Smoker  . Smokeless tobacco: Never Used  Substance and Sexual Activity  . Alcohol use: Not Currently  . Drug use: Never  . Sexual activity: Yes  Other Topics Concern  . Not on file  Social History Narrative  . Not on file   Social  Determinants of Health   Financial Resource Strain:   . Difficulty of Paying Living Expenses: Not on file  Food Insecurity:   . Worried About Charity fundraiser in the Last Year: Not on file  . Ran Out of Food in the Last Year: Not on file  Transportation Needs:   . Lack of Transportation (Medical): Not on file  . Lack of Transportation (Non-Medical): Not on file  Physical Activity:   . Days of Exercise per Week: Not on file  . Minutes of Exercise per Session: Not on file  Stress:   . Feeling of Stress : Not on file  Social Connections:   . Frequency of Communication with Friends and Family: Not on file  . Frequency of Social Gatherings with Friends and Family: Not on file  . Attends Religious Services: Not on file  . Active Member of Clubs or Organizations: Not on file  . Attends Archivist Meetings: Not on file  . Marital Status: Not on file    Review of Systems  Constitutional: Positive for unexpected weight change (dropped from 164 to 154.  ). Negative for chills, fatigue and fever.  HENT: Negative for congestion, ear pain, rhinorrhea and sore throat.   Eyes: Negative for visual disturbance.  Respiratory: Positive for shortness of breath (with exertion). Negative for cough.   Cardiovascular: Negative for chest pain.  Gastrointestinal: Positive for nausea (due to gastric bypass. ). Negative for abdominal pain, constipation, diarrhea and vomiting.  Endocrine: Positive for polydipsia. Negative for polyphagia and polyuria.  Genitourinary: Negative for dysuria and urgency.  Musculoskeletal: Positive for back pain (lumbar - for years. No changes.). Negative for arthralgias and myalgias.  Skin: Negative for rash.  Neurological: Positive for dizziness (LIGHT HEADED. NO VERTIGO. ), weakness and headaches.     Objective:  Temp 98 F (36.7 C)   Resp 16   Ht 5\' 6"  (1.676 m)   Wt 154 lb 6.4 oz (70 kg)   BMI 24.92 kg/m   BP/Weight 04/16/2020 03/26/2020 9/32/3557    Systolic BP - 322 94  Diastolic BP - 64 50  Wt. (Lbs) 154.4 164 158.4  BMI 24.92 26.47 25.57    Physical Exam Vitals reviewed.  Constitutional:      General: She is not in acute distress.    Appearance: Normal appearance. She is obese.  Neck:     Thyroid: No thyroid mass.     Vascular: No carotid bruit.  Cardiovascular:     Rate and Rhythm: Normal rate and regular rhythm.  Pulses: Normal pulses.     Heart sounds: No murmur heard.   Pulmonary:     Effort: Pulmonary effort is normal.     Breath sounds: Normal breath sounds.  Abdominal:     General: Bowel sounds are normal.     Palpations: Abdomen is soft. There is no mass.     Tenderness: There is no abdominal tenderness.  Musculoskeletal:        General: Normal range of motion.     Right lower leg: Edema present.     Left lower leg: Edema present.  Lymphadenopathy:     Cervical: No cervical adenopathy.  Skin:    General: Skin is warm and dry.  Neurological:     Mental Status: She is alert and oriented to person, place, and time.     Comments: Positive rhombergs. Mild dysmetria. Gait is unstable.  Psychiatric:        Mood and Affect: Mood normal.        Behavior: Behavior normal.     Lab Results  Component Value Date   WBC 15.1 (H) 04/16/2020   HGB 10.4 (L) 04/16/2020   HCT 32.0 (L) 04/16/2020   PLT 290 04/16/2020   GLUCOSE 71 04/16/2020   CHOL 142 11/01/2019   TRIG 81 11/01/2019   HDL 59 11/01/2019   LDLCALC 67 11/01/2019   ALT 31 04/16/2020   AST 28 04/16/2020   NA 138 04/16/2020   K 4.3 04/16/2020   CL 98 04/16/2020   CREATININE 1.62 (H) 04/16/2020   BUN 31 (H) 04/16/2020   CO2 22 04/16/2020   TSH 2.050 04/16/2020      Assessment & Plan:  1. Vitamin B12 deficiency (non anemic) - B12 and Folate Panel  2. Anemia in other chronic diseases classified elsewhere - Cbc  3. Other specified hypothyroidism - T4, Free - TSH  4. Other specified hypotension START ON MIDODRINE ONE THREE TIMES A  DAY.   5. Unstable gait - Ambulatory referral to Physical Therapy  6. Generalized weakness - Ambulatory referral to Physical Therapy  7. Dizziness - CBC with Differential/Platelet - Comprehensive metabolic panel  8. Encounter for immunization - Flu Vaccine MDCK QUAD PF   Meds ordered this encounter  Medications  . midodrine (PROAMATINE) 2.5 MG tablet    Sig: Take 1 tablet (2.5 mg total) by mouth 3 (three) times daily with meals.    Dispense:  90 tablet    Refill:  0    Orders Placed This Encounter  Procedures  . Flu Vaccine MDCK QUAD PF  . CBC with Differential/Platelet  . Comprehensive metabolic panel  . T4, Free  . TSH  . B12 and Folate Panel  . Ambulatory referral to Physical Therapy     Follow-up: Return in about 3 weeks (around 05/07/2020).  An After Visit Summary was printed and given to the patient.  Rochel Brome [facility] [phone]

## 2020-04-16 ENCOUNTER — Encounter: Payer: Self-pay | Admitting: Family Medicine

## 2020-04-16 ENCOUNTER — Other Ambulatory Visit: Payer: Self-pay

## 2020-04-16 ENCOUNTER — Ambulatory Visit (INDEPENDENT_AMBULATORY_CARE_PROVIDER_SITE_OTHER): Payer: 59 | Admitting: Family Medicine

## 2020-04-16 VITALS — Temp 98.0°F | Resp 16 | Ht 66.0 in | Wt 154.4 lb

## 2020-04-16 DIAGNOSIS — D638 Anemia in other chronic diseases classified elsewhere: Secondary | ICD-10-CM | POA: Diagnosis not present

## 2020-04-16 DIAGNOSIS — R531 Weakness: Secondary | ICD-10-CM

## 2020-04-16 DIAGNOSIS — E538 Deficiency of other specified B group vitamins: Secondary | ICD-10-CM | POA: Diagnosis not present

## 2020-04-16 DIAGNOSIS — I9589 Other hypotension: Secondary | ICD-10-CM

## 2020-04-16 DIAGNOSIS — R2681 Unsteadiness on feet: Secondary | ICD-10-CM

## 2020-04-16 DIAGNOSIS — R42 Dizziness and giddiness: Secondary | ICD-10-CM

## 2020-04-16 DIAGNOSIS — E038 Other specified hypothyroidism: Secondary | ICD-10-CM

## 2020-04-16 DIAGNOSIS — Z23 Encounter for immunization: Secondary | ICD-10-CM

## 2020-04-16 MED ORDER — MIDODRINE HCL 2.5 MG PO TABS: 2.5000 mg | ORAL_TABLET | Freq: Three times a day (TID) | ORAL | 0 refills | Status: DC

## 2020-04-16 NOTE — Patient Instructions (Signed)
START ON MIDODRINE ONE THREE TIMES A DAY.  ORDER PHYSICAL THERAPY.

## 2020-04-18 LAB — COMPREHENSIVE METABOLIC PANEL
ALT: 31 IU/L (ref 0–32)
AST: 28 IU/L (ref 0–40)
Albumin/Globulin Ratio: 1.3 (ref 1.2–2.2)
Albumin: 2.9 g/dL — ABNORMAL LOW (ref 3.8–4.9)
Alkaline Phosphatase: 110 IU/L (ref 44–121)
BUN/Creatinine Ratio: 19 (ref 9–23)
BUN: 31 mg/dL — ABNORMAL HIGH (ref 6–24)
Bilirubin Total: 0.6 mg/dL (ref 0.0–1.2)
CO2: 22 mmol/L (ref 20–29)
Calcium: 8.1 mg/dL — ABNORMAL LOW (ref 8.7–10.2)
Chloride: 98 mmol/L (ref 96–106)
Creatinine, Ser: 1.62 mg/dL — ABNORMAL HIGH (ref 0.57–1.00)
GFR calc Af Amer: 41 mL/min/{1.73_m2} — ABNORMAL LOW (ref >59)
GFR calc non Af Amer: 35 mL/min/{1.73_m2} — ABNORMAL LOW (ref >59)
Globulin, Total: 2.2 g/dL (ref 1.5–4.5)
Glucose: 71 mg/dL (ref 65–99)
Potassium: 4.3 mmol/L (ref 3.5–5.2)
Sodium: 138 mmol/L (ref 134–144)
Total Protein: 5.1 g/dL — ABNORMAL LOW (ref 6.0–8.5)

## 2020-04-18 LAB — CBC WITH DIFFERENTIAL/PLATELET
Basophils Absolute: 0.1 10*3/uL (ref 0.0–0.2)
Basos: 1 %
EOS (ABSOLUTE): 0.3 10*3/uL (ref 0.0–0.4)
Eos: 2 %
Hematocrit: 32 % — ABNORMAL LOW (ref 34.0–46.6)
Hemoglobin: 10.4 g/dL — ABNORMAL LOW (ref 11.1–15.9)
Immature Grans (Abs): 0.1 10*3/uL (ref 0.0–0.1)
Immature Granulocytes: 1 %
Lymphocytes Absolute: 2.4 10*3/uL (ref 0.7–3.1)
Lymphs: 16 %
MCH: 32.2 pg (ref 26.6–33.0)
MCHC: 32.5 g/dL (ref 31.5–35.7)
MCV: 99 fL — ABNORMAL HIGH (ref 79–97)
Monocytes Absolute: 0.8 10*3/uL (ref 0.1–0.9)
Monocytes: 6 %
Neutrophils Absolute: 11.3 10*3/uL — ABNORMAL HIGH (ref 1.4–7.0)
Neutrophils: 74 %
Platelets: 290 10*3/uL (ref 150–450)
RBC: 3.23 x10E6/uL — ABNORMAL LOW (ref 3.77–5.28)
RDW: 11.6 % — ABNORMAL LOW (ref 11.7–15.4)
WBC: 15.1 10*3/uL — ABNORMAL HIGH (ref 3.4–10.8)

## 2020-04-18 LAB — T4, FREE: Free T4: 1.23 ng/dL (ref 0.82–1.77)

## 2020-04-18 LAB — B12 AND FOLATE PANEL
Folate: >20 ng/mL (ref >3.0)
Vitamin B-12: >2000 pg/mL — ABNORMAL HIGH (ref 232–1245)

## 2020-04-18 LAB — TSH: TSH: 2.05 u[IU]/mL (ref 0.450–4.500)

## 2020-04-19 ENCOUNTER — Other Ambulatory Visit: Payer: Self-pay

## 2020-04-19 DIAGNOSIS — F330 Major depressive disorder, recurrent, mild: Secondary | ICD-10-CM

## 2020-04-19 DIAGNOSIS — F411 Generalized anxiety disorder: Secondary | ICD-10-CM | POA: Insufficient documentation

## 2020-04-19 DIAGNOSIS — R7989 Other specified abnormal findings of blood chemistry: Secondary | ICD-10-CM

## 2020-04-19 DIAGNOSIS — R945 Abnormal results of liver function studies: Secondary | ICD-10-CM

## 2020-04-19 HISTORY — DX: Major depressive disorder, recurrent, mild: F33.0

## 2020-04-21 ENCOUNTER — Other Ambulatory Visit: Payer: Self-pay | Admitting: Family Medicine

## 2020-04-21 DIAGNOSIS — D72829 Elevated white blood cell count, unspecified: Secondary | ICD-10-CM | POA: Insufficient documentation

## 2020-04-21 DIAGNOSIS — D638 Anemia in other chronic diseases classified elsewhere: Secondary | ICD-10-CM

## 2020-04-21 DIAGNOSIS — D72828 Other elevated white blood cell count: Secondary | ICD-10-CM

## 2020-04-22 ENCOUNTER — Other Ambulatory Visit: Payer: Self-pay | Admitting: Physician Assistant

## 2020-04-26 ENCOUNTER — Other Ambulatory Visit: Payer: Self-pay | Admitting: Family Medicine

## 2020-04-28 ENCOUNTER — Other Ambulatory Visit: Payer: Self-pay | Admitting: Legal Medicine

## 2020-04-30 ENCOUNTER — Encounter: Payer: Self-pay | Admitting: Neurology

## 2020-04-30 ENCOUNTER — Ambulatory Visit (INDEPENDENT_AMBULATORY_CARE_PROVIDER_SITE_OTHER): Payer: 59 | Admitting: Neurology

## 2020-04-30 ENCOUNTER — Other Ambulatory Visit: Payer: Self-pay

## 2020-04-30 VITALS — BP 114/64 | HR 102 | Ht 66.0 in | Wt 157.4 lb

## 2020-04-30 DIAGNOSIS — R270 Ataxia, unspecified: Secondary | ICD-10-CM | POA: Diagnosis not present

## 2020-04-30 DIAGNOSIS — R292 Abnormal reflex: Secondary | ICD-10-CM | POA: Diagnosis not present

## 2020-04-30 DIAGNOSIS — G459 Transient cerebral ischemic attack, unspecified: Secondary | ICD-10-CM | POA: Diagnosis not present

## 2020-04-30 DIAGNOSIS — G629 Polyneuropathy, unspecified: Secondary | ICD-10-CM

## 2020-04-30 NOTE — Patient Instructions (Signed)
1.  If no contraindication, I would recommend starting aspirin 81mg  daily 2.  I will order an MRI of cervical spine without contrast to evaluate for any pinching of the spinal cord by a herniated disc or arthritis that may be affecting balance 3.  Follow up in 6 months.

## 2020-04-30 NOTE — Progress Notes (Signed)
NEUROLOGY CONSULTATION NOTE  Molly Parrish MRN: [id number] DOB: 21-Feb-1966  Referring provider: Rochel Brome, MD Primary care provider: Rochel Brome, MD  Reason for consult:  TIA  HISTORY OF PRESENT ILLNESS: Molly Patricia. Parrish is a 54 year old right-handed female with alcoholic cirrhosis, Korsakoff syndrome, portal hypertension, Bipolar disorder and generalized anxiety disorder who presents for TIA.  History supplemented by hospital and referring provider's note.  She was admitted to [facility] from 02/04/2020 to 02/07/2020 for bilateral community-acquired pneumonia, chronic lower GI bleed and questionable TIA.  She presented with shortness of breath and  left sided facial numbness and tingling and left upper extremity numbness and weakness and left lower extremity numbness.  She had trouble speaking as well.  CT of head showed no acute intracranial abnormality.  Follow up MRI of the brain showed minimal chronic small vessel ischemic changes and remote lacunar infarct within the left basal ganglia..  CTA of head and neck showed no large vessel occlusion or hemodynamically significant stenosis.  2D echocardiogram showed normal EF with no cardiac source of emboli.  Telemetry revealed no atrial fibrillation.  She had a Hgb of 6.9 and received 2 units of packed RBC.  For pneumonia, she was treated with Rocephin and discharged on Zithromax and continued on home Omnicef.  She had a follow up outpatient colonoscopy and endoscopy which revealed no bleeding or ulcers.    She has a history of 3 prior TIAs.  However, she never took any antiplatelet therapy.  She is not on a statin.  She says her cholesterol is good.  She is on antihypertensives for portal hypertension.  She monitors her blood pressure daily.  It usually runs around 112/60.    Since these TIAs, she continues to have lower extremity weakness and balance problems.  She uses a cane for a while, even before the last TIA.  She reports  increased falls.  She has numbness in the feet.  Labs from September include B12 was over 2000 and TSH 2.050.  PAST MEDICAL HISTORY: Past Medical History:  Diagnosis Date   Alcoholic cirrhosis of liver ([facility])    Bipolar disorder current episode depressed ([place])    Generalized anxiety disorder    Korsakoff syndrome ([place])    Other osteoporosis without current pathological fracture    Other specified hypothyroidism    Portal hypertension ([place])     PAST SURGICAL HISTORY: Past Surgical History:  Procedure Laterality Date   CATARACT EXTRACTION     GASTRIC BYPASS     HEMORRHOID SURGERY     HERNIA REPAIR     metatarsus abductus surgery Left    PARTIAL HYSTERECTOMY     THYROIDECTOMY      MEDICATIONS: Current Outpatient Medications on File Prior to Visit  Medication Sig Dispense Refill   ALPRAZolam (XANAX) 1 MG tablet TAKE 1 TABLET BY MOUTH TWICE DAILY AS NEEDED FOR SEVERE ANXIETY 60 tablet 2   calcitRIOL (ROCALTROL) 0.5 MCG capsule TAKE 2 CAPSULES BY MOUTH TWICE A DAY.     ergocalciferol (VITAMIN D2) 1.25 MG (50000 UT) capsule Take by mouth.     famotidine (PEPCID) 40 MG tablet      furosemide (LASIX) 40 MG tablet TAKE 2 TABLETS BY MOUTH TWICE DAILY 180 tablet 1   hydrOXYzine (VISTARIL) 25 MG capsule TAKE 1 CAPSULE(25 MG) BY MOUTH THREE TIMES DAILY 90 capsule 3   Iron-FA-B Cmp-C-Biot-Probiotic (FUSION PLUS) CAPS Take by mouth 2 (two) times daily.      lamoTRIgine (LAMICTAL)  200 MG tablet Take 1 at night     levothyroxine (SYNTHROID) 200 MCG tablet Patient takes 1 tablets M-F and 1/2 Sat and Sun     midodrine (PROAMATINE) 2.5 MG tablet Take 1 tablet (2.5 mg total) by mouth 3 (three) times daily with meals. 90 tablet 0   potassium chloride SA (KLOR-CON) 20 MEQ tablet TAKE 4 TABLETS BY MOUTH TWICE DAILY 240 tablet 2   promethazine (PHENERGAN) 25 MG tablet TAKE 1 TABLET(25 MG) BY MOUTH FOUR TIMES DAILY AS NEEDED FOR NAUSEA OR VOMITING 40 tablet 2   propranolol  (INDERAL) 10 MG tablet Take by mouth.     QUEtiapine (SEROQUEL) 50 MG tablet Take  2 at night     rifaximin (XIFAXAN) 550 MG TABS tablet Take 550 mg by mouth 2 (two) times daily.     sertraline (ZOLOFT) 100 MG tablet Take 1 tab twice a day     spironolactone (ALDACTONE) 50 MG tablet Take 1 tablet (50 mg total) by mouth daily. (Patient taking differently: Take 100 mg by mouth 2 (two) times daily. ) 30 tablet 0   temazepam (RESTORIL) 30 MG capsule Take 1 to 2 at night     topiramate ER (QUDEXY XR) 150 MG CS24 sprinkle capsule Take by mouth.     traMADol (ULTRAM) 50 MG tablet TAKE 2 TABLETS BY MOUTH TWICE DAILY FOR BACK PAIN 120 tablet 1   triamcinolone (KENALOG) 0.1 % paste APPLY TO THE AFFECTED AREA TWICE DAILY AS DIRECTED 5 g 2   No current facility-administered medications on file prior to visit.    ALLERGIES: No Known Allergies  FAMILY HISTORY: Family History  Problem Relation Age of Onset   Cancer Mother    Osteoporosis Mother     SOCIAL HISTORY: Social History   Socioeconomic History   Marital status: Married    Spouse name: Not on file   Number of children: Not on file   Years of education: Not on file   Highest education level: Not on file  Occupational History   Not on file  Tobacco Use   Smoking status: Never Smoker   Smokeless tobacco: Never Used  Substance and Sexual Activity   Alcohol use: Not Currently   Drug use: Never   Sexual activity: Yes  Other Topics Concern   Not on file  Social History Narrative   Not on file   Social Determinants of Health   Financial Resource Strain:    Difficulty of Paying Living Expenses: Not on file  Food Insecurity:    Worried About [place] in the Last Year: Not on file   Ran Out of Food in the Last Year: Not on file  Transportation Needs:    Lack of Transportation (Medical): Not on file   Lack of Transportation (Non-Medical): Not on file  Physical Activity:    Days of Exercise  per Week: Not on file   Minutes of Exercise per Session: Not on file  Stress:    Feeling of Stress : Not on file  Social Connections:    Frequency of Communication with Friends and Family: Not on file   Frequency of Social Gatherings with Friends and Family: Not on file   Attends Religious Services: Not on file   Active Member of Clubs or Organizations: Not on file   Attends Archivist Meetings: Not on file   Marital Status: Not on file  Intimate Partner Violence:    Fear of Current or Ex-Partner: Not on  file   Emotionally Abused: Not on file   Physically Abused: Not on file   Sexually Abused: Not on file     PHYSICAL EXAM: Blood pressure 114/64, pulse (!) 102, height 5\' 6"  (1.676 m), weight 157 lb 6.4 oz (71.4 kg), SpO2 96 %. General: No acute distress.  Patient appears well-groomed.   Head:  Normocephalic/atraumatic Eyes:  fundi examined but not visualized Neck: supple, no paraspinal tenderness, full range of motion Back: No paraspinal tenderness Heart: regular rate and rhythm Lungs: Clear to auscultation bilaterally. Vascular: No carotid bruits. Neurological Exam: Mental status: alert and oriented to person, place, and time.  Recalled 4 of 5 words after 5 minutes; remote memory intact, fund of knowledge intact, attention and concentration intact, speech fluent and not dysarthric, language intact. Cranial nerves: CN I: not tested CN II: pupils equal, round and reactive to light, visual fields intact CN III, IV, VI:  full range of motion, no nystagmus, no ptosis CN V: facial sensation intact CN VII: upper and lower face symmetric CN VIII: hearing intact CN IX, X: gag intact, uvula midline CN XI: sternocleidomastoid and trapezius muscles intact CN XII: tongue midline Bulk & Tone: normal, no fasciculations. Motor:  5/5 bilateral upper extremities, 4+/5 left hip flexion, 5-/5 right hip flexion and bilateral knee extension/flexion, 5/5 ankle  dorsiflexion/plantar flexion Sensation:  Pinprick and vibratory sensation reduced in feet. Deep Tendon Reflexes:  3+ throughout, toes downgoing.  Bilateral Hoffman sign Finger to nose testing:  Without dysmetria.  Gait:  Cautious spastic gait.  Romberg with sway.  IMPRESSION: 1.  Transient ischemic attack, recurrent 2.  Unsteady gait/balance disorder.  May be multifactorial due to underlying peripheral neuropathy (possibly from long-term alcohol abuse) and congenital feet deformities.  Based on MRI, unlikely to be related to cerebrovascular disease.  She does exhibit hyperreflexia and Hoffman sign.  Therefore, I want to rule out possible myelopathy due to cervical spinal stenosis or disc herniation.  PLAN: 1.  Secondary stroke prevention as managed by PCP:  -  Recommend starting ASA 81mg  daily for secondary stroke prevention if no contraindication (GI workup reportedly unremarkable).  -  LDL goal less than 70.  Consider statin.  -  Glycemic control.  Hgb A1c goal less than 7.  -  Blood pressure control 2.  MRI of cervical spine without contrast.  Further recommendations pending results. 3.  Follow up in 6 months.  Thank you for allowing me to take part in the care of this patient.  Metta Clines, DO  CC: Rochel Brome, MD

## 2020-05-06 DIAGNOSIS — L603 Nail dystrophy: Secondary | ICD-10-CM | POA: Insufficient documentation

## 2020-05-07 ENCOUNTER — Other Ambulatory Visit: Payer: Self-pay | Admitting: Family Medicine

## 2020-05-08 ENCOUNTER — Other Ambulatory Visit: Payer: Self-pay | Admitting: Family Medicine

## 2020-05-08 ENCOUNTER — Ambulatory Visit (INDEPENDENT_AMBULATORY_CARE_PROVIDER_SITE_OTHER): Payer: 59 | Admitting: Family Medicine

## 2020-05-08 ENCOUNTER — Other Ambulatory Visit: Payer: Self-pay

## 2020-05-08 ENCOUNTER — Telehealth: Payer: Self-pay

## 2020-05-08 ENCOUNTER — Encounter: Payer: Self-pay | Admitting: Family Medicine

## 2020-05-08 VITALS — BP 104/66 | HR 88 | Temp 96.9°F | Resp 18 | Ht 66.0 in | Wt 157.0 lb

## 2020-05-08 DIAGNOSIS — S0003XA Contusion of scalp, initial encounter: Secondary | ICD-10-CM | POA: Diagnosis not present

## 2020-05-08 DIAGNOSIS — R410 Disorientation, unspecified: Secondary | ICD-10-CM | POA: Diagnosis not present

## 2020-05-08 DIAGNOSIS — I9589 Other hypotension: Secondary | ICD-10-CM

## 2020-05-08 DIAGNOSIS — D500 Iron deficiency anemia secondary to blood loss (chronic): Secondary | ICD-10-CM

## 2020-05-08 DIAGNOSIS — R350 Frequency of micturition: Secondary | ICD-10-CM

## 2020-05-08 DIAGNOSIS — S0990XA Unspecified injury of head, initial encounter: Secondary | ICD-10-CM | POA: Diagnosis not present

## 2020-05-08 DIAGNOSIS — N1831 Chronic kidney disease, stage 3a: Secondary | ICD-10-CM

## 2020-05-08 DIAGNOSIS — S0512XA Contusion of eyeball and orbital tissues, left eye, initial encounter: Secondary | ICD-10-CM | POA: Diagnosis not present

## 2020-05-08 DIAGNOSIS — K9089 Other intestinal malabsorption: Secondary | ICD-10-CM

## 2020-05-08 DIAGNOSIS — D72828 Other elevated white blood cell count: Secondary | ICD-10-CM

## 2020-05-08 DIAGNOSIS — I959 Hypotension, unspecified: Secondary | ICD-10-CM

## 2020-05-08 DIAGNOSIS — K766 Portal hypertension: Secondary | ICD-10-CM

## 2020-05-08 DIAGNOSIS — F3131 Bipolar disorder, current episode depressed, mild: Secondary | ICD-10-CM

## 2020-05-08 DIAGNOSIS — E871 Hypo-osmolality and hyponatremia: Secondary | ICD-10-CM

## 2020-05-08 DIAGNOSIS — R296 Repeated falls: Secondary | ICD-10-CM

## 2020-05-08 DIAGNOSIS — R21 Rash and other nonspecific skin eruption: Secondary | ICD-10-CM

## 2020-05-08 DIAGNOSIS — K7031 Alcoholic cirrhosis of liver with ascites: Secondary | ICD-10-CM

## 2020-05-08 DIAGNOSIS — E43 Unspecified severe protein-calorie malnutrition: Secondary | ICD-10-CM

## 2020-05-08 DIAGNOSIS — E876 Hypokalemia: Secondary | ICD-10-CM

## 2020-05-08 DIAGNOSIS — E782 Mixed hyperlipidemia: Secondary | ICD-10-CM

## 2020-05-08 HISTORY — DX: Iron deficiency anemia secondary to blood loss (chronic): D50.0

## 2020-05-08 HISTORY — DX: Hypotension, unspecified: I95.9

## 2020-05-08 LAB — COMPREHENSIVE METABOLIC PANEL
ALT: 39 IU/L — ABNORMAL HIGH (ref 0–32)
AST: 54 IU/L — ABNORMAL HIGH (ref 0–40)
Albumin/Globulin Ratio: 1.2 (ref 1.2–2.2)
Albumin: 2.6 g/dL — ABNORMAL LOW (ref 3.8–4.9)
Alkaline Phosphatase: 152 IU/L — ABNORMAL HIGH (ref 44–121)
BUN/Creatinine Ratio: 29 — ABNORMAL HIGH (ref 9–23)
BUN: 39 mg/dL — ABNORMAL HIGH (ref 6–24)
Bilirubin Total: 0.8 mg/dL (ref 0.0–1.2)
CO2: 22 mmol/L (ref 20–29)
Calcium: 6.9 mg/dL — CL (ref 8.7–10.2)
Chloride: 95 mmol/L — ABNORMAL LOW (ref 96–106)
Creatinine, Ser: 1.35 mg/dL — ABNORMAL HIGH (ref 0.57–1.00)
GFR calc Af Amer: 51 mL/min/{1.73_m2} — ABNORMAL LOW (ref >59)
GFR calc non Af Amer: 44 mL/min/{1.73_m2} — ABNORMAL LOW (ref >59)
Globulin, Total: 2.1 g/dL (ref 1.5–4.5)
Glucose: 88 mg/dL (ref 65–99)
Potassium: 4.3 mmol/L (ref 3.5–5.2)
Sodium: 130 mmol/L — ABNORMAL LOW (ref 134–144)
Total Protein: 4.7 g/dL — ABNORMAL LOW (ref 6.0–8.5)

## 2020-05-08 LAB — CBC WITH DIFFERENTIAL/PLATELET
Basophils Absolute: 0.1 10*3/uL (ref 0.0–0.2)
Basos: 1 %
EOS (ABSOLUTE): 0.1 10*3/uL (ref 0.0–0.4)
Eos: 1 %
Hematocrit: 28.1 % — ABNORMAL LOW (ref 34.0–46.6)
Hemoglobin: 9.1 g/dL — ABNORMAL LOW (ref 11.1–15.9)
Immature Grans (Abs): 0.2 10*3/uL — ABNORMAL HIGH (ref 0.0–0.1)
Immature Granulocytes: 1 %
Lymphocytes Absolute: 2.2 10*3/uL (ref 0.7–3.1)
Lymphs: 12 %
MCH: 33 pg (ref 26.6–33.0)
MCHC: 32.4 g/dL (ref 31.5–35.7)
MCV: 102 fL — ABNORMAL HIGH (ref 79–97)
Monocytes Absolute: 1.2 10*3/uL — ABNORMAL HIGH (ref 0.1–0.9)
Monocytes: 7 %
Neutrophils Absolute: 14.4 10*3/uL — ABNORMAL HIGH (ref 1.4–7.0)
Neutrophils: 78 %
Platelets: 328 10*3/uL (ref 150–450)
RBC: 2.76 x10E6/uL — ABNORMAL LOW (ref 3.77–5.28)
RDW: 13.4 % (ref 11.7–15.4)
WBC: 18.2 10*3/uL — ABNORMAL HIGH (ref 3.4–10.8)

## 2020-05-08 LAB — AMMONIA: Ammonia: 29 ug/dL — ABNORMAL LOW (ref 34–178)

## 2020-05-08 LAB — SEDIMENTATION RATE: Sed Rate: 14 mm/hr (ref 0–40)

## 2020-05-08 MED ORDER — DOXYCYCLINE HYCLATE 100 MG PO TABS: 100.0000 mg | ORAL_TABLET | Freq: Two times a day (BID) | ORAL | 0 refills | Status: DC

## 2020-05-08 NOTE — Telephone Encounter (Signed)
Dr. Tobie Poet please advise.  Santiago Glad from [facility] called with a critical lab for pt. Pt calcium was 6.9.

## 2020-05-08 NOTE — Progress Notes (Addendum)
Subjective:  Patient ID: Molly Parrish, female    DOB: 09-15-1965  Age: 54 y.o. MRN: [id number]  Chief Complaint  Patient presents with  . Hypothyroidism  . Anemia    HPI  Patient is a 54 year old white female with past medical history significant for alcoholic cirrhosis complicated by portal hypertension, iron deficiency anemia, idiopathic neuropathy, CVA, hypocalcemia, bipolar 1, postsurgical hypothyroidism, who presents for routine follow-up.  Patient reports a fall 3 weeks ago in which she lost her balance and fell hitting her face.  She had no loss of consciousness.  She did not seek medical care at that time.  Denies headaches.  Denies vision changes.  Patient was found to have a remote infarction and was referred to neurology, Dr. Tomi Likens.  He saw her earlier this month.  He is ordered a MRI of her C-spine to determine if cervical stenosis is causing her ataxia and falls.  He feels her neuropathy is secondary to her history of alcoholism.  Idiopathic neuropathy -as stated above neurology believes this is secondary to her alcoholism.  Currently is taking B12 however her B12 levels have been confusing.  Her B12 levels have been high but her MMA has been high.  High MMA suggest possible early B12 levels however her B12 level does not coincide.    Patient was also having significant issues with hypotension (orthostatic hypotension) which was complicated by the fact that she was anemic.  Hemoglobin dropped to 7.5 in August 2021.  She underwent an EGD and colonoscopy because she did have some blood in her stool.  Both of which were normal.  No capsule endoscopy was completed.  Patient was seen by Dr. Eber Jones at [facility].  Her anemia has improved with supplemental iron.  Today however she is feeling dizzy, lightheaded.  She also seems a little confused.  She does continue to have fairly low blood pressures.  I did start her on midodrine 2.5 mg 3 times daily with meals on April 16, 2020.  Hypothyroidism: Postsurgical.  This has been complicated with hypocalcemia.  She is currently on calcitriol 0.50 mcg 2 twice daily, as well as OTC nature is on calcium with vitamin D total of 1800 mg/day.   Patient liver cirrhosis being treated with Xifaxan 550 mg twice daily, spironolactone 100 mg twice daily, and Lasix 40 mg 2 tablets twice daily.  Patient is also on propranolol 10 mg daily for portal hypertension.  Patient has significant hypoalbuminemia.  This is complicated by malabsorption due to gastric bypass that she had several years ago.  She does try to eat smaller meals several times per day but often will vomit.  She has promethazine written for nausea and vomiting.  Patient is also supplemented with potassium chloride 20 mEq 4 twice daily.  Bipolar 1, recurrent, depression: Patient is treated by Dr. Erling Cruz, psychiatry.  She is currently on temazepam 30 mg once at night for sleep.Zoloft 100 mg twice daily Seroquel 50 mg 2 at night, Lamictal 200 mg 1 at night and Xanax 1 mg twice daily the as needed for severe anxiety.  She also has hydroxyzine 25 mg 3 times a day.  This was originally I think prescribed for her chronic pruritus due to her liver cirrhosis however it also may be being utilized for her anxiety.  Patient seems very sedated today.    She has significant weakness as well and has difficulty getting up from the chair without using her hands.  She does have  significant get back pain and is being treated with tramadol      Current Outpatient Medications on File Prior to Visit  Medication Sig Dispense Refill  . ALPRAZolam (XANAX) 1 MG tablet TAKE 1 TABLET BY MOUTH TWICE DAILY AS NEEDED FOR SEVERE ANXIETY 60 tablet 2  . calcitRIOL (ROCALTROL) 0.5 MCG capsule TAKE 2 CAPSULES BY MOUTH TWICE A DAY.    . famotidine (PEPCID) 40 MG tablet     . furosemide (LASIX) 40 MG tablet TAKE 2 TABLETS BY MOUTH TWICE DAILY 180 tablet 1  . hydrOXYzine (VISTARIL) 25 MG capsule TAKE 1  CAPSULE(25 MG) BY MOUTH THREE TIMES DAILY 90 capsule 3  . Iron-FA-B Cmp-C-Biot-Probiotic (FUSION PLUS) CAPS Take by mouth 2 (two) times daily.     Marland Kitchen lamoTRIgine (LAMICTAL) 200 MG tablet Take 1 at night    . levothyroxine (SYNTHROID) 200 MCG tablet Patient takes 1 tablets M-F and 1/2 Sat and Sun    . midodrine (PROAMATINE) 2.5 MG tablet Take 1 tablet (2.5 mg total) by mouth 3 (three) times daily with meals. 90 tablet 0  . potassium chloride SA (KLOR-CON) 20 MEQ tablet TAKE 4 TABLETS BY MOUTH TWICE DAILY 240 tablet 2  . promethazine (PHENERGAN) 25 MG tablet TAKE 1 TABLET(25 MG) BY MOUTH FOUR TIMES DAILY AS NEEDED FOR NAUSEA OR VOMITING 40 tablet 2  . propranolol (INDERAL) 10 MG tablet Take by mouth.    . QUEtiapine (SEROQUEL) 50 MG tablet Take  2 at night    . rifaximin (XIFAXAN) 550 MG TABS tablet Take 550 mg by mouth 2 (two) times daily.    . sertraline (ZOLOFT) 100 MG tablet Take 1 tab twice a day    . spironolactone (ALDACTONE) 50 MG tablet Take 1 tablet (50 mg total) by mouth daily. (Patient taking differently: Take 100 mg by mouth 2 (two) times daily. ) 30 tablet 0  . temazepam (RESTORIL) 30 MG capsule Take 1 to 2 at night    . traMADol (ULTRAM) 50 MG tablet TAKE 2 TABLETS BY MOUTH TWICE DAILY FOR BACK PAIN 120 tablet 1  . triamcinolone (KENALOG) 0.1 % paste APPLY TO THE AFFECTED AREA TWICE DAILY AS DIRECTED 5 g 2   No current facility-administered medications on file prior to visit.   Past Medical History:  Diagnosis Date  . Alcoholic cirrhosis of liver ([place])   . Bipolar disorder current episode depressed ([place])   . Generalized anxiety disorder   . Korsakoff syndrome ([place])   . Other osteoporosis without current pathological fracture   . Other specified hypothyroidism   . Papillary thyroid carcinoma ([place]) 12/10/2015  . Portal hypertension (HCC)    Past Surgical History:  Procedure Laterality Date  . CATARACT EXTRACTION    . GASTRIC BYPASS    . HEMORRHOID SURGERY    . HERNIA REPAIR     . metatarsus abductus surgery Left   . PARTIAL HYSTERECTOMY    . THYROIDECTOMY      Family History  Problem Relation Age of Onset  . Cancer Mother   . Osteoporosis Mother    Social History   Socioeconomic History  . Marital status: Married    Spouse name: Not on file  . Number of children: Not on file  . Years of education: Not on file  . Highest education level: Not on file  Occupational History  . Not on file  Tobacco Use  . Smoking status: Never Smoker  . Smokeless tobacco: Never Used  Substance and Sexual Activity  .  Alcohol use: Not Currently  . Drug use: Never  . Sexual activity: Yes  Other Topics Concern  . Not on file  Social History Narrative   Right handed   Social Determinants of Health   Financial Resource Strain:   . Difficulty of Paying Living Expenses: Not on file  Food Insecurity:   . Worried About Charity fundraiser in the Last Year: Not on file  . Ran Out of Food in the Last Year: Not on file  Transportation Needs:   . Lack of Transportation (Medical): Not on file  . Lack of Transportation (Non-Medical): Not on file  Physical Activity:   . Days of Exercise per Week: Not on file  . Minutes of Exercise per Session: Not on file  Stress:   . Feeling of Stress : Not on file  Social Connections:   . Frequency of Communication with Friends and Family: Not on file  . Frequency of Social Gatherings with Friends and Family: Not on file  . Attends Religious Services: Not on file  . Active Member of Clubs or Organizations: Not on file  . Attends Archivist Meetings: Not on file  . Marital Status: Not on file    Review of Systems  Constitutional: Positive for fatigue. Negative for chills and fever.  HENT: Negative for congestion, ear pain, rhinorrhea and sore throat.   Respiratory: Positive for shortness of breath (with exertion.). Negative for cough.   Cardiovascular: Negative for chest pain and palpitations.  Gastrointestinal: Negative  for abdominal pain, constipation, diarrhea, nausea and vomiting.  Genitourinary: Positive for frequency. Negative for dysuria and urgency.  Musculoskeletal: Positive for back pain. Negative for myalgias.  Neurological: Positive for dizziness, weakness and light-headedness. Negative for headaches.  Psychiatric/Behavioral: Positive for dysphoric mood. The patient is not nervous/anxious.    Fell 3 weeks ago. Forward - hit her face. Black eye.  [place] forward today.   Objective:  BP 104/66   Pulse 88   Temp (!) 96.9 F (36.1 C)   Resp 18   Ht 5\' 6"  (1.676 m)   Wt 157 lb (71.2 kg)   BMI 25.34 kg/m   BP/Weight 05/09/2020 05/08/2020 32/20/2542  Systolic BP 706 237 628  Diastolic BP 64 66 64  Wt. (Lbs) 153.4 157 157.4  BMI 24.76 25.34 25.41    Physical Exam Vitals reviewed.  Constitutional:      Appearance: Normal appearance. She is normal weight.  Eyes:     Comments: PERRLA - mild and slowed dilation. Left eye lid seems to droop, however when tested, pt can resist equally with trying to open her eyes manually.  Cardiovascular:     Rate and Rhythm: Normal rate and regular rhythm.     Heart sounds: Normal heart sounds.  Pulmonary:     Effort: Pulmonary effort is normal. No respiratory distress.     Breath sounds: Normal breath sounds.  Abdominal:     General: Abdomen is flat. Bowel sounds are normal.     Palpations: Abdomen is soft.     Tenderness: There is no abdominal tenderness. There is no right CVA tenderness or left CVA tenderness.  Skin:    Findings: Rash (BL LE - excoriations. some echymosis. very dry skin. ) present.  Neurological:     General: No focal deficit present.     Mental Status: She is alert and oriented to person, place, and time.     Motor: Weakness present.     Gait: Gait abnormal.  Psychiatric:        Mood and Affect: Mood normal.        Behavior: Behavior normal.      Lab Results  Component Value Date   WBC 17.1 (H) 05/09/2020   HGB 9.1 (L)  05/09/2020   HCT 27.9 (L) 05/09/2020   PLT 365 05/09/2020   GLUCOSE 81 05/09/2020   CHOL 137 05/08/2020   TRIG 55 05/08/2020   HDL 93 05/08/2020   LDLCALC 32 05/08/2020   ALT 52 (H) 05/09/2020   AST 91 (H) 05/09/2020   NA 132 (L) 05/09/2020   K 3.3 (L) 05/09/2020   CL 96 05/09/2020   CREATININE 1.25 (H) 05/09/2020   BUN 35 (H) 05/09/2020   CO2 22 05/09/2020   TSH 2.050 04/16/2020      Assessment & Plan:   1. Contusion of scalp, initial encounter - CT Head Wo Contrast - CT Maxillofacial WO CM  2. Confusion-multifactorial.  Concerned about anemia.  Concerned about electrolyte abnormalities.  Concerned about numerous sedating medications many of which is psychiatric medicines although not exclusively these.  Patient also has had a stroke in the past although her symptoms do not appear to be focal. - Ammonia - CBC with Differential/Platelet - Comprehensive metabolic panel  3. Contusion of left orbital tissues, initial encounter Discussed importance of prevention of falls. - CT Head Wo Contrast - CT Maxillofacial WO CM  4. Traumatic injury of head, initial encounter Discussed importance of prevention of falls. - CT Head Wo Contrast - CT Maxillofacial WO CM  5. Rash Triamcinolone prescription sent. - Sedimentation Rate  6. Hypocalcemia Continue calcitriol 0. 5 0 mcg 2 pills twice daily, as well as OTC calcium 1800 mg daily. Calcium came back significantly low.  Patient recommended to come back tomorrow for stat CMP - Comprehensive metabolic panel  7. Severe protein-calorie malnutrition (HCC)-Albumin significantly low. - Comprehensive metabolic panel  8. Alcoholic cirrhosis of liver with ascites (HCC) - Ammonia - CMP  9. Iron deficiency anemia due to chronic blood loss - CBC with Differential/Platelet  10. Mixed hyperlipidemia - Lipid panel - Cardiovascular Risk Assessment  11. Other specified intestinal malabsorption Due to gastric bypass.  I am concerned  her calcitriol is not getting absorbed properly thus causing the hypocalcemia.    12. Portal hypertension (HCC) Continue propranolol at current dose.  13. Other specified hypotension For now continue midodrine.  14. Bipolar 1 disorder, depressed, mild ([place]) I will try to call Dr. Erling Cruz to see if any of her medications could be decreased which could be contributing to her disorientation and sedation.  15. Stage 3a chronic kidney disease ([place]) Concerned that her creatinine overall has worsened over the last 12 months.  Would recommend referral to nephrology.  16. Hypokalemia - Comprehensive metabolic panel  17. Frequent falls Recommend caution to prevent falls.  May benefit from physical therapy.  We will start discussed following review of labs and CAT scans.  18.  Hyponatremia - CMP.  Orders Placed This Encounter  Procedures  . CT Head Wo Contrast  . CT Maxillofacial WO CM  . Lipid panel  . Ammonia  . Sedimentation Rate  . CBC with Differential/Platelet  . Comprehensive metabolic panel  . Cardiovascular Risk Assessment     Follow-up: Return in about 4 weeks (around 06/05/2020).  An After Visit Summary was printed and given to the patient.  Rochel Brome, MD [facility] [phone]

## 2020-05-09 ENCOUNTER — Other Ambulatory Visit: Payer: Self-pay | Admitting: Family Medicine

## 2020-05-09 ENCOUNTER — Ambulatory Visit (INDEPENDENT_AMBULATORY_CARE_PROVIDER_SITE_OTHER): Payer: 59 | Admitting: Family Medicine

## 2020-05-09 VITALS — BP 106/64 | HR 86 | Temp 97.2°F | Resp 16 | Wt 153.4 lb

## 2020-05-09 DIAGNOSIS — N3001 Acute cystitis with hematuria: Secondary | ICD-10-CM

## 2020-05-09 DIAGNOSIS — R350 Frequency of micturition: Secondary | ICD-10-CM

## 2020-05-09 DIAGNOSIS — N1831 Chronic kidney disease, stage 3a: Secondary | ICD-10-CM

## 2020-05-09 DIAGNOSIS — D72828 Other elevated white blood cell count: Secondary | ICD-10-CM

## 2020-05-09 DIAGNOSIS — E871 Hypo-osmolality and hyponatremia: Secondary | ICD-10-CM

## 2020-05-09 LAB — COMPREHENSIVE METABOLIC PANEL
ALT: 52 IU/L — ABNORMAL HIGH (ref 0–32)
AST: 91 IU/L — ABNORMAL HIGH (ref 0–40)
Albumin/Globulin Ratio: 1.3 (ref 1.2–2.2)
Albumin: 2.4 g/dL — ABNORMAL LOW (ref 3.8–4.9)
Alkaline Phosphatase: 146 IU/L — ABNORMAL HIGH (ref 44–121)
BUN/Creatinine Ratio: 28 — ABNORMAL HIGH (ref 9–23)
BUN: 35 mg/dL — ABNORMAL HIGH (ref 6–24)
Bilirubin Total: 0.9 mg/dL (ref 0.0–1.2)
CO2: 22 mmol/L (ref 20–29)
Calcium: 6.7 mg/dL — CL (ref 8.7–10.2)
Chloride: 96 mmol/L (ref 96–106)
Creatinine, Ser: 1.25 mg/dL — ABNORMAL HIGH (ref 0.57–1.00)
GFR calc Af Amer: 56 mL/min/{1.73_m2} — ABNORMAL LOW (ref >59)
GFR calc non Af Amer: 48 mL/min/{1.73_m2} — ABNORMAL LOW (ref >59)
Globulin, Total: 1.9 g/dL (ref 1.5–4.5)
Glucose: 81 mg/dL (ref 65–99)
Potassium: 3.3 mmol/L — ABNORMAL LOW (ref 3.5–5.2)
Sodium: 132 mmol/L — ABNORMAL LOW (ref 134–144)
Total Protein: 4.3 g/dL — CL (ref 6.0–8.5)

## 2020-05-09 LAB — LIPID PANEL
Chol/HDL Ratio: 1.5 ratio (ref 0.0–4.4)
Cholesterol, Total: 137 mg/dL (ref 100–199)
HDL: 93 mg/dL (ref >39)
LDL Chol Calc (NIH): 32 mg/dL (ref 0–99)
Triglycerides: 55 mg/dL (ref 0–149)
VLDL Cholesterol Cal: 12 mg/dL (ref 5–40)

## 2020-05-09 LAB — CBC WITH DIFFERENTIAL/PLATELET
Basophils Absolute: 0.1 10*3/uL (ref 0.0–0.2)
Basos: 1 %
EOS (ABSOLUTE): 0.2 10*3/uL (ref 0.0–0.4)
Eos: 1 %
Hematocrit: 27.9 % — ABNORMAL LOW (ref 34.0–46.6)
Hemoglobin: 9.1 g/dL — ABNORMAL LOW (ref 11.1–15.9)
Immature Grans (Abs): 0.2 10*3/uL — ABNORMAL HIGH (ref 0.0–0.1)
Immature Granulocytes: 1 %
Lymphocytes Absolute: 1.8 10*3/uL (ref 0.7–3.1)
Lymphs: 10 %
MCH: 33.3 pg — ABNORMAL HIGH (ref 26.6–33.0)
MCHC: 32.6 g/dL (ref 31.5–35.7)
MCV: 102 fL — ABNORMAL HIGH (ref 79–97)
Monocytes Absolute: 1.1 10*3/uL — ABNORMAL HIGH (ref 0.1–0.9)
Monocytes: 7 %
Neutrophils Absolute: 13.7 10*3/uL — ABNORMAL HIGH (ref 1.4–7.0)
Neutrophils: 80 %
Platelets: 365 10*3/uL (ref 150–450)
RBC: 2.73 x10E6/uL — CL (ref 3.77–5.28)
RDW: 13.6 % (ref 11.7–15.4)
WBC: 17.1 10*3/uL — ABNORMAL HIGH (ref 3.4–10.8)

## 2020-05-09 LAB — POCT URINALYSIS DIP (CLINITEK)
Bilirubin, UA: NEGATIVE
Glucose, UA: NEGATIVE mg/dL
Ketones, POC UA: NEGATIVE mg/dL
Nitrite, UA: NEGATIVE
POC PROTEIN,UA: NEGATIVE
Spec Grav, UA: 1.02 (ref 1.010–1.025)
Urobilinogen, UA: NEGATIVE E.U./dL — AB
pH, UA: 6.5 (ref 5.0–8.0)

## 2020-05-09 LAB — CALCIUM, IONIZED: Calcium, Ion: 3.9 mg/dL — ABNORMAL LOW (ref 4.5–5.6)

## 2020-05-09 LAB — CORTISOL: Cortisol: 20.2 ug/dL

## 2020-05-09 LAB — CARDIOVASCULAR RISK ASSESSMENT

## 2020-05-09 MED ORDER — OZEMPIC (1 MG/DOSE) 2 MG/1.5ML ~~LOC~~ SOPN: 1.0000 mg | PEN_INJECTOR | SUBCUTANEOUS | 2 refills | Status: DC

## 2020-05-09 MED ORDER — TRIAMCINOLONE ACETONIDE 0.1 % EX CREA
1.0000 | TOPICAL_CREAM | Freq: Two times a day (BID) | CUTANEOUS | 0 refills | Status: DC
Start: 2020-05-09 — End: 2020-07-17

## 2020-05-09 MED ORDER — TRIAMCINOLONE ACETONIDE 0.1 % EX CREA: 1.0000 "application " | TOPICAL_CREAM | Freq: Two times a day (BID) | CUTANEOUS | 0 refills | Status: DC

## 2020-05-09 MED ORDER — CEFTRIAXONE SODIUM 1 G IJ SOLR
1.0000 g | Freq: Once | INTRAMUSCULAR | Status: AC
  Administered 2020-05-09: 1 g via INTRAMUSCULAR

## 2020-05-09 NOTE — Progress Notes (Signed)
Subjective:  Patient ID: Molly Parrish, female    DOB: 10-20-1965  Age: 54 y.o. MRN: [id number]  Chief Complaint  Patient presents with  . Urinary Tract Infection    HPI  Patient is a 54 year old white female with past medical history significant for alcoholic cirrhosis complicated by portal hypertension, iron deficiency anemia, idiopathic neuropathy, CVA, hypocalcemia, bipolar 1, postsurgical hypothyroidism, who presents for routine follow-up.  Patient reports a fall 3 weeks ago in which she lost her balance and fell hitting her face.  She had no loss of consciousness.  She did not seek medical care at that time.  Denies headaches.  Denies vision changes.  CT scan of brain and facial bones was normal other than old infarction. Awaiting results of MRI of her C-spine which was ordered by Dr. Tomi Likens, but has not yet been completed.    Hypocalcemia chronic but is worsened.  This is multifactorial.  Causes include liver cirrhosis, malabsorption, chronic kidney disease, vitamin D deficiency.  Patient presents to repeat blood work. She is currently on calcitriol 0.50 mcg 2 twice daily, as well as OTC nature is on calcium with vitamin D total of 1800 mg/day.   Patient was found to have an elevated white blood count yesterday to 17,000.  She came in this morning to give [place] a urinalysis to see if she had an infection which she does.  She denies fevers.,  Chills, sweats.  Denies any sinus symptoms.  Denies chest pain.  Denies abdominal pain.  Patient's anemia dropped from 10.4-9.1.  Will repeat blood count today.  Continue on iron supplements.  Continue B12 supplements.  Platelets were normal.  Patient has had significant significant GI work-up.  Today, she is feeling somewhat better.  Not quite as dizzy or lightheaded.    Patient liver cirrhosis being treated with Xifaxan 550 mg twice daily, spironolactone 100 mg twice daily, Lasix 40 mg 2 tablets twice daily, and Ozempic 1 mg once weekly.  Patient  is also on propranolol 10 mg daily for portal hypertension.  Patient has significant hypoalbuminemia, hyponatremia, hypocalcemia, and hypokalemia.  Her potassium patient is also supplemented with potassium chloride 20 mEq 4 twice daily.  Plan to repeat chemistry panel today.  Patient was found to have a significantly elevated white blood count yesterday.  As such I did start her on doxycycline last night after discussing her symptoms again.  She was having some frequency and we did not check a urinalysis on her yesterday.  She had no evidence of sinusitis on her CT scans.  She had denied any tooth pain.  And I did have her come in this morning to check urinalysis.  Current Outpatient Medications on File Prior to Visit  Medication Sig Dispense Refill  . ALPRAZolam (XANAX) 1 MG tablet TAKE 1 TABLET BY MOUTH TWICE DAILY AS NEEDED FOR SEVERE ANXIETY 60 tablet 2  . calcitRIOL (ROCALTROL) 0.5 MCG capsule TAKE 2 CAPSULES BY MOUTH TWICE A DAY.    Marland Kitchen doxycycline (VIBRA-TABS) 100 MG tablet Take 1 tablet (100 mg total) by mouth 2 (two) times daily. 20 tablet 0  . famotidine (PEPCID) 40 MG tablet     . furosemide (LASIX) 40 MG tablet TAKE 2 TABLETS BY MOUTH TWICE DAILY 180 tablet 1  . hydrOXYzine (VISTARIL) 25 MG capsule TAKE 1 CAPSULE(25 MG) BY MOUTH THREE TIMES DAILY 90 capsule 3  . Iron-FA-B Cmp-C-Biot-Probiotic (FUSION PLUS) CAPS Take by mouth 2 (two) times daily.     Marland Kitchen lamoTRIgine (LAMICTAL)  200 MG tablet Take 1 at night    . levothyroxine (SYNTHROID) 200 MCG tablet Patient takes 1 tablets M-F and 1/2 Sat and Sun    . midodrine (PROAMATINE) 2.5 MG tablet Take 1 tablet (2.5 mg total) by mouth 3 (three) times daily with meals. 90 tablet 0  . potassium chloride SA (KLOR-CON) 20 MEQ tablet TAKE 4 TABLETS BY MOUTH TWICE DAILY 240 tablet 2  . promethazine (PHENERGAN) 25 MG tablet TAKE 1 TABLET(25 MG) BY MOUTH FOUR TIMES DAILY AS NEEDED FOR NAUSEA OR VOMITING 40 tablet 2  . propranolol (INDERAL) 10 MG tablet Take  by mouth.    . QUEtiapine (SEROQUEL) 50 MG tablet Take  2 at night    . rifaximin (XIFAXAN) 550 MG TABS tablet Take 550 mg by mouth 2 (two) times daily.    . sertraline (ZOLOFT) 100 MG tablet Take 1 tab twice a day    . spironolactone (ALDACTONE) 50 MG tablet Take 1 tablet (50 mg total) by mouth daily. (Patient taking differently: Take 100 mg by mouth 2 (two) times daily. ) 30 tablet 0  . temazepam (RESTORIL) 30 MG capsule Take 1 to 2 at night    . traMADol (ULTRAM) 50 MG tablet TAKE 2 TABLETS BY MOUTH TWICE DAILY FOR BACK PAIN 120 tablet 1  . triamcinolone (KENALOG) 0.1 % paste APPLY TO THE AFFECTED AREA TWICE DAILY AS DIRECTED 5 g 2   No current facility-administered medications on file prior to visit.   Past Medical History:  Diagnosis Date  . Alcoholic cirrhosis of liver ([place])   . Bipolar disorder current episode depressed ([place])   . CKD (chronic kidney disease)   . Generalized anxiety disorder   . GERD (gastroesophageal reflux disease)   . Hypokalemia   . Korsakoff syndrome ([place])   . Other osteoporosis without current pathological fracture   . Other specified hypothyroidism   . Papillary thyroid carcinoma ([place]) 12/10/2015  . Portal hypertension ([place])   . Stroke ([place])   . Vitamin D deficiency    Past Surgical History:  Procedure Laterality Date  . CATARACT EXTRACTION    . GASTRIC BYPASS    . HEMORRHOID SURGERY    . HERNIA REPAIR    . metatarsus abductus surgery Left   . PARTIAL HYSTERECTOMY    . THYROIDECTOMY      Family History  Problem Relation Age of Onset  . Cancer Mother   . Osteoporosis Mother    Social History   Socioeconomic History  . Marital status: Married    Spouse name: Not on file  . Number of children: Not on file  . Years of education: Not on file  . Highest education level: Not on file  Occupational History  . Not on file  Tobacco Use  . Smoking status: Never Smoker  . Smokeless tobacco: Never Used  Substance and Sexual Activity  . Alcohol  use: Not Currently  . Drug use: Never  . Sexual activity: Yes  Other Topics Concern  . Not on file  Social History Narrative   Right handed   Social Determinants of Health   Financial Resource Strain:   . Difficulty of Paying Living Expenses: Not on file  Food Insecurity:   . Worried About Charity fundraiser in the Last Year: Not on file  . Ran Out of Food in the Last Year: Not on file  Transportation Needs:   . Lack of Transportation (Medical): Not on file  . Lack of Transportation (Non-Medical):  Not on file  Physical Activity:   . Days of Exercise per Week: Not on file  . Minutes of Exercise per Session: Not on file  Stress:   . Feeling of Stress : Not on file  Social Connections:   . Frequency of Communication with Friends and Family: Not on file  . Frequency of Social Gatherings with Friends and Family: Not on file  . Attends Religious Services: Not on file  . Active Member of Clubs or Organizations: Not on file  . Attends Archivist Meetings: Not on file  . Marital Status: Not on file    Review of Systems  Constitutional: Positive for fatigue. Negative for chills and fever.  HENT: Negative for congestion, ear pain, rhinorrhea and sore throat.   Respiratory: Positive for shortness of breath (with exertion.). Negative for cough.   Cardiovascular: Negative for chest pain and palpitations.  Gastrointestinal: Negative for abdominal pain, constipation, diarrhea, nausea and vomiting.  Genitourinary: Positive for frequency. Negative for dysuria and urgency.  Musculoskeletal: Positive for back pain. Negative for myalgias.  Neurological: Positive for dizziness, weakness and light-headedness. Negative for headaches.  Psychiatric/Behavioral: Positive for dysphoric mood. The patient is not nervous/anxious.   .   Objective:  BP 106/64   Pulse 86   Temp (!) 97.2 F (36.2 C)   Resp 16   Wt 153 lb 6.4 oz (69.6 kg)   BMI 24.76 kg/m   BP/Weight 05/09/2020 05/08/2020  49/70/2637  Systolic BP 858 850 277  Diastolic BP 64 66 64  Wt. (Lbs) 153.4 157 157.4  BMI 24.76 25.34 25.41    Physical Exam Vitals reviewed.  Constitutional:      Appearance: Normal appearance. She is normal weight. Patient appears to be feeling better today.  Does not seem as though sluggish. Cardiovascular:     Rate and Rhythm: Normal rate and regular rhythm.     Heart sounds: Normal heart sounds.  Pulmonary:     Effort: Pulmonary effort is normal. No respiratory distress.     Breath sounds: Normal breath sounds.  Abdominal:     General: Abdomen is flat. Bowel sounds are normal.     Palpations: Abdomen is soft.     Tenderness: There is no abdominal tenderness. There is no right CVA tenderness or left CVA tenderness.  Psychiatric:        Mood and Affect: Mood normal.        Behavior: Behavior normal.      Lab Results  Component Value Date   WBC 17.1 (H) 05/09/2020   HGB 9.1 (L) 05/09/2020   HCT 27.9 (L) 05/09/2020   PLT 365 05/09/2020   GLUCOSE 81 05/09/2020   CHOL 137 05/08/2020   TRIG 55 05/08/2020   HDL 93 05/08/2020   LDLCALC 32 05/08/2020   ALT 52 (H) 05/09/2020   AST 91 (H) 05/09/2020   NA 132 (L) 05/09/2020   K 3.3 (L) 05/09/2020   CL 96 05/09/2020   CREATININE 1.25 (H) 05/09/2020   BUN 35 (H) 05/09/2020   CO2 22 05/09/2020   TSH 2.050 04/16/2020      Assessment & Plan:  1.  Urinary tract infection Continue doxycycline. Given Rocephin 1 g IM. Urinalysis positive. Urine culture sent.  2. Hypocalcemia Continue calcitriol 0. 5 0 mcg 2 pills twice daily.  Ordered chemistry panel today.  Calcium was decreased more.  Increased OTC calcium 300 mg 8 pills daily. - Comprehensive metabolic panel  3.  Hyponatremia Check cortisol level today  which was normal. Repeated chemistry panel.    4.  Hypokalemia. Increase potassium chloride to 20 mEq 5 twice daily.    5. Alcoholic cirrhosis of liver with ascites (HCC) Ammonia level done yesterday was  normal. Liver enzymes (AST/ALT) were slightly elevated yesterday.  We repeated these today stat and they had doubled.  They are still less than 100. - CMP  6. Iron deficiency anemia due to chronic blood loss Check stat CBC today.  Anemia was stable since yesterday.  I will try to discuss with Dr. Eber Jones the possibility of a capsule endoscopy.  Continue iron supplements.  7. Bipolar 1 disorder, depressed, mild ([place]) As stated yesterday, will try to call Dr. Erling Cruz to see if any of her medications could be decreased, which could be contributing to her disorientation and sedation.  8. Stage 3a chronic kidney disease ([place]) Stat CMP showed that her creatinine had improved since yesterday slightly.  Creatinine 1.25.  GFR is 48.  - referral to nephrology.   Orders Placed This Encounter  Procedures  . Urine Culture     Follow-up: Return in about 1 month (around 06/09/2020) for CHRONIC . WILL NEED MORE LABS NEXT WEEK. WILL SEND LABS AND NOTES TO DR. Tamala Julian AND [facility].  An After Visit Summary was printed and given to the patient.  Rochel Brome, MD [facility] [phone]

## 2020-05-10 ENCOUNTER — Other Ambulatory Visit: Payer: Self-pay | Admitting: Family Medicine

## 2020-05-10 ENCOUNTER — Encounter: Payer: Self-pay | Admitting: Family Medicine

## 2020-05-12 ENCOUNTER — Other Ambulatory Visit: Payer: Medicare Other

## 2020-05-13 ENCOUNTER — Ambulatory Visit (INDEPENDENT_AMBULATORY_CARE_PROVIDER_SITE_OTHER): Payer: 59

## 2020-05-13 ENCOUNTER — Other Ambulatory Visit: Payer: Self-pay

## 2020-05-13 ENCOUNTER — Other Ambulatory Visit: Payer: Self-pay | Admitting: Family Medicine

## 2020-05-13 DIAGNOSIS — D72828 Other elevated white blood cell count: Secondary | ICD-10-CM

## 2020-05-13 DIAGNOSIS — R350 Frequency of micturition: Secondary | ICD-10-CM

## 2020-05-13 DIAGNOSIS — R945 Abnormal results of liver function studies: Secondary | ICD-10-CM

## 2020-05-13 DIAGNOSIS — R7989 Other specified abnormal findings of blood chemistry: Secondary | ICD-10-CM

## 2020-05-13 LAB — POCT URINALYSIS DIPSTICK
Bilirubin, UA: NEGATIVE
Blood, UA: NEGATIVE
Clarity, UA: NEGATIVE
Glucose, UA: NEGATIVE
Ketones, UA: NEGATIVE
Leukocytes, UA: NEGATIVE
Nitrite, UA: NEGATIVE
Protein, UA: NEGATIVE
Spec Grav, UA: 1.015 (ref 1.010–1.025)
Urobilinogen, UA: NEGATIVE E.U./dL — AB
pH, UA: 6 (ref 5.0–8.0)

## 2020-05-13 LAB — MAGNESIUM: Magnesium: 2 mg/dL (ref 1.6–2.3)

## 2020-05-13 LAB — SPECIMEN STATUS REPORT

## 2020-05-13 LAB — VITAMIN D 25 HYDROXY (VIT D DEFICIENCY, FRACTURES): Vit D, 25-Hydroxy: 29.8 ng/mL — ABNORMAL LOW (ref 30.0–100.0)

## 2020-05-14 ENCOUNTER — Other Ambulatory Visit: Payer: Self-pay | Admitting: Family Medicine

## 2020-05-14 ENCOUNTER — Telehealth: Payer: Self-pay

## 2020-05-14 LAB — URINE CULTURE

## 2020-05-14 LAB — COMPREHENSIVE METABOLIC PANEL
ALT: 41 IU/L — ABNORMAL HIGH (ref 0–32)
AST: 28 IU/L (ref 0–40)
Albumin/Globulin Ratio: 1 — ABNORMAL LOW (ref 1.2–2.2)
Albumin: 2.2 g/dL — ABNORMAL LOW (ref 3.8–4.9)
Alkaline Phosphatase: 138 IU/L — ABNORMAL HIGH (ref 44–121)
BUN/Creatinine Ratio: 18 (ref 9–23)
BUN: 21 mg/dL (ref 6–24)
Bilirubin Total: 0.3 mg/dL (ref 0.0–1.2)
CO2: 24 mmol/L (ref 20–29)
Calcium: 7.5 mg/dL — ABNORMAL LOW (ref 8.7–10.2)
Chloride: 100 mmol/L (ref 96–106)
Creatinine, Ser: 1.18 mg/dL — ABNORMAL HIGH (ref 0.57–1.00)
GFR calc Af Amer: 60 mL/min/{1.73_m2} (ref >59)
GFR calc non Af Amer: 52 mL/min/{1.73_m2} — ABNORMAL LOW (ref >59)
Globulin, Total: 2.2 g/dL (ref 1.5–4.5)
Glucose: 64 mg/dL — ABNORMAL LOW (ref 65–99)
Potassium: 4.7 mmol/L (ref 3.5–5.2)
Sodium: 134 mmol/L (ref 134–144)
Total Protein: 4.4 g/dL — CL (ref 6.0–8.5)

## 2020-05-14 LAB — CBC
Hematocrit: 26.3 % — ABNORMAL LOW (ref 34.0–46.6)
Hemoglobin: 8.6 g/dL — ABNORMAL LOW (ref 11.1–15.9)
MCH: 33.7 pg — ABNORMAL HIGH (ref 26.6–33.0)
MCHC: 32.7 g/dL (ref 31.5–35.7)
MCV: 103 fL — ABNORMAL HIGH (ref 79–97)
Platelets: 354 10*3/uL (ref 150–450)
RBC: 2.55 x10E6/uL — CL (ref 3.77–5.28)
RDW: 13.1 % (ref 11.7–15.4)
WBC: 15 10*3/uL — ABNORMAL HIGH (ref 3.4–10.8)

## 2020-05-14 NOTE — Telephone Encounter (Signed)
[facility] called on critical lab for patient on her RBC it was 2.55.

## 2020-05-14 NOTE — Telephone Encounter (Signed)
Patient aware of results.

## 2020-05-14 NOTE — Telephone Encounter (Signed)
Reviewed

## 2020-05-15 ENCOUNTER — Other Ambulatory Visit: Payer: Self-pay

## 2020-05-15 LAB — URINE CULTURE

## 2020-05-15 MED ORDER — AMOXICILLIN 500 MG PO TABS
500.0000 mg | ORAL_TABLET | Freq: Two times a day (BID) | ORAL | 0 refills | Status: DC
Start: 2020-05-15 — End: 2020-07-17

## 2020-05-16 ENCOUNTER — Ambulatory Visit: Payer: Medicare Other

## 2020-05-17 ENCOUNTER — Other Ambulatory Visit: Payer: Self-pay | Admitting: Hematology and Oncology

## 2020-05-17 ENCOUNTER — Other Ambulatory Visit: Payer: Self-pay

## 2020-05-17 DIAGNOSIS — D649 Anemia, unspecified: Secondary | ICD-10-CM | POA: Diagnosis not present

## 2020-05-17 LAB — COMPREHENSIVE METABOLIC PANEL
Albumin: 2.4 — AB (ref 3.5–5.0)
Calcium: 7.7 — AB (ref 8.7–10.7)

## 2020-05-17 LAB — HEPATIC FUNCTION PANEL
ALT: 37 — AB (ref 7–35)
AST: 29 (ref 13–35)
Alkaline Phosphatase: 160 — AB (ref 25–125)
Bilirubin, Total: 0.5

## 2020-05-17 LAB — CBC AND DIFFERENTIAL
HCT: 28 — AB (ref 36–46)
Hemoglobin: 8.9 — AB (ref 12.0–16.0)
Neutrophils Absolute: 14.36
Platelets: 459 — AB (ref 150–399)
WBC: 16.7

## 2020-05-17 LAB — BASIC METABOLIC PANEL
BUN: 45 — AB (ref 4–21)
CO2: 23 — AB (ref 13–22)
Chloride: 91 — AB (ref 99–108)
Creatinine: 2 — AB (ref 0.5–1.1)
Glucose: 159
Potassium: 4.2 (ref 3.4–5.3)
Sodium: 123 — AB (ref 137–147)

## 2020-05-17 LAB — CBC: RBC: 2.68 — AB (ref 3.87–5.11)

## 2020-05-17 MED ORDER — FLUCONAZOLE 150 MG PO TABS
150.0000 mg | ORAL_TABLET | Freq: Every day | ORAL | 0 refills | Status: DC
Start: 2020-05-17 — End: 2020-07-17

## 2020-05-19 ENCOUNTER — Other Ambulatory Visit: Payer: Self-pay | Admitting: Family Medicine

## 2020-05-27 ENCOUNTER — Other Ambulatory Visit: Payer: Self-pay

## 2020-05-27 ENCOUNTER — Ambulatory Visit (INDEPENDENT_AMBULATORY_CARE_PROVIDER_SITE_OTHER): Payer: 59 | Admitting: Family Medicine

## 2020-05-27 ENCOUNTER — Encounter: Payer: Self-pay | Admitting: Family Medicine

## 2020-05-27 ENCOUNTER — Ambulatory Visit: Payer: Medicare Other | Admitting: Oncology

## 2020-05-27 ENCOUNTER — Ambulatory Visit: Payer: Medicare Other | Admitting: Family Medicine

## 2020-05-27 ENCOUNTER — Other Ambulatory Visit: Payer: Self-pay | Admitting: Oncology

## 2020-05-27 VITALS — BP 122/60 | HR 88 | Temp 98.0°F | Resp 18 | Wt 160.2 lb

## 2020-05-27 DIAGNOSIS — E8809 Other disorders of plasma-protein metabolism, not elsewhere classified: Secondary | ICD-10-CM

## 2020-05-27 DIAGNOSIS — R2689 Other abnormalities of gait and mobility: Secondary | ICD-10-CM | POA: Diagnosis not present

## 2020-05-27 DIAGNOSIS — E871 Hypo-osmolality and hyponatremia: Secondary | ICD-10-CM | POA: Diagnosis not present

## 2020-05-27 DIAGNOSIS — Z148 Genetic carrier of other disease: Secondary | ICD-10-CM

## 2020-05-27 DIAGNOSIS — K7031 Alcoholic cirrhosis of liver with ascites: Secondary | ICD-10-CM

## 2020-05-27 DIAGNOSIS — K9089 Other intestinal malabsorption: Secondary | ICD-10-CM

## 2020-05-27 DIAGNOSIS — R296 Repeated falls: Secondary | ICD-10-CM

## 2020-05-27 DIAGNOSIS — D638 Anemia in other chronic diseases classified elsewhere: Secondary | ICD-10-CM

## 2020-05-27 DIAGNOSIS — N189 Chronic kidney disease, unspecified: Secondary | ICD-10-CM

## 2020-05-27 DIAGNOSIS — E43 Unspecified severe protein-calorie malnutrition: Secondary | ICD-10-CM

## 2020-05-27 DIAGNOSIS — D631 Anemia in chronic kidney disease: Secondary | ICD-10-CM

## 2020-05-27 DIAGNOSIS — S0990XA Unspecified injury of head, initial encounter: Secondary | ICD-10-CM

## 2020-05-27 NOTE — Progress Notes (Deleted)
[facility]  [address] [place],  [place]  [address] [phone]  Clinic Day:  05/27/2020  Referring physician: Rochel Brome, MD   HISTORY OF PRESENT ILLNESS:  The patient is a 54 y.o. female   who I recently began seeing for leukocytosis and anemia.  She comes in today to go over all of her recent labs to determine the etiology behind this. Since her last visit, the patient has been doing fairly well.   She continues to deny having any recent infectious/inflammatory disorders to explain her leukocytosis.    She continues to deny having any overt forms of blood loss to explain her anemia.   PHYSICAL EXAM:  There were no vitals taken for this visit. Wt Readings from Last 3 Encounters:  05/27/20 160 lb 3.2 oz (72.7 kg)  05/09/20 153 lb 6.4 oz (69.6 kg)  05/08/20 157 lb (71.2 kg)   There is no height or weight on file to calculate BMI. Performance status (ECOG): {CHL ONC Q3448304 Physical Exam Constitutional:      Appearance: Normal appearance. She is not ill-appearing.  HENT:     Mouth/Throat:     Mouth: Mucous membranes are moist.     Pharynx: Oropharynx is clear. No oropharyngeal exudate or posterior oropharyngeal erythema.  Cardiovascular:     Rate and Rhythm: Normal rate and regular rhythm.     Heart sounds: No murmur heard.  No friction rub. No gallop.   Pulmonary:     Effort: Pulmonary effort is normal. No respiratory distress.     Breath sounds: Normal breath sounds. No wheezing, rhonchi or rales.  Abdominal:     General: Bowel sounds are normal. There is no distension.     Palpations: Abdomen is soft. There is no mass.     Tenderness: There is no abdominal tenderness.  Musculoskeletal:        General: No swelling.     Right lower leg: No edema.     Left lower leg: No edema.  Lymphadenopathy:     Cervical: No cervical adenopathy.     Upper Body:     Right upper body: No supraclavicular or axillary adenopathy.     Left  upper body: No supraclavicular or axillary adenopathy.     Lower Body: No right inguinal adenopathy. No left inguinal adenopathy.  Skin:    General: Skin is warm.     Coloration: Skin is not jaundiced.     Findings: No lesion or rash.  Neurological:     General: No focal deficit present.     Mental Status: She is alert and oriented to person, place, and time. Mental status is at baseline.     Cranial Nerves: Cranial nerves are intact.  Psychiatric:        Mood and Affect: Mood normal.        Behavior: Behavior normal.        Thought Content: Thought content normal.     LABS:   CBC Latest Ref Rng & Units 05/17/2020 05/13/2020 05/09/2020  WBC - 16.7 15.0(H) 17.1(H)  Hemoglobin 12.0 - 16.0 8.9(A) 8.6(L) 9.1(L)  Hematocrit 36 - 46 28(A) 26.3(L) 27.9(L)  Platelets 150 - 399 459(A) 354 365   CMP Latest Ref Rng & Units 05/17/2020 05/13/2020 05/09/2020  Glucose 65 - 99 mg/dL - 64(L) 81  BUN 4 - 21 45(A) 21 35(H)  Creatinine 0.5 - 1.1 2.0(A) 1.18(H) 1.25(H)  Sodium 137 - 147 123(A) 134 132(L)  Potassium 3.4 -  5.3 4.2 4.7 3.3(L)  Chloride 99 - 108 91(A) 100 96  CO2 13 - 22 23(A) 24 22  Calcium 8.7 - 10.7 7.7(A) 7.5(L) 6.7(LL)  Total Protein 6.0 - 8.5 g/dL - 4.4(LL) 4.3(LL)  Total Bilirubin 0.0 - 1.2 mg/dL - 0.3 0.9  Alkaline Phos 25 - 125 160(A) 138(H) 146(H)  AST 13 - 35 29 28 91(H)  ALT 7 - 35 37(A) 41(H) 52(H)     STUDIES:  No results found.    ASSESSMENT & PLAN:   Assessment/Plan:  A 54 y.o. female with both leukocytosis and anemia.   When evaluating her recent labs, she does not have any nutritional deficiencies factoring into her anemia.  It does appear her kidney disease is the primary culprit behind her anemia.   The patient understands all the plans discussed today and is in agreement with them.      Jermya Dowding Macarthur Critchley, MD

## 2020-05-27 NOTE — Progress Notes (Signed)
Subjective:  Patient ID: Molly Parrish, female    DOB: 12-01-1965  Age: 54 y.o. MRN: [id number]  Chief Complaint  Patient presents with  . frequent falls    HPI  Patient is a 54 year old white female with several severe chronic medical problems including liver cirrhosis secondary to history of alcohol use, hereditary hemochromatosis (carrier), hyponatremia, portal hypertension, hypothyroidism, vitamin D deficiency, hypoalbuminemia, history of stroke, Korsakoff syndrome, intestinal malabsorption secondary to gastric bypass, and anemia (multifactorial.)  Patient underwent an EGD and colonoscopy at [facility] in the last 3 months.  No source of a GI bleed was found.  She did not undergo a capsule endoscopy.  GI expressed satisfaction with a hemoglobin in the 7, due to her portal hypertension however this makes me uncomfortable especially with her significant imbalance and falls..  Recently patient has also had a worsening elevation of her white blood count.  In addition the patient has chronic kidney disease which has significantly worsened in the last 2 months.  She saw Dr. Lavera Guise last week.  Since that appointment she has averaged 8 falls this week.  Yesterday she fell on the porch backwards, hit her head, and laid there for 1 hour before anyone came to help her.  She was unable to get up on her own due to weakness.  She was averaging 2-3 falls per week and has had a significant worsening of these.  She went to physical therapy last Monday, but very little was able to be done due to her imbalance.  She has not been able to go back this past week.  She denies any major injuries.  The patient was having hypotension and I had added midodrine 2.5 mg 3 times daily  Patient has persistent hyponatremia despite numerous changes we and Dr. Tamala Julian, endocrinology, have made to her medications.  This is been a problem since she had her thyroid removed due to thyroid cancer in 2014 and I131  radiation.  Her most recent sodium was 123 on May 17, 2020 when she was seen by Dr. Bobby Rumpf.  At that time her hemoglobin had actually improved to 8.9.  She is scheduled to see him on May 27, 2020.  Patient is suffering from severe fatigue, imbalance, dyspnea on exertion, increased swelling in her lower extremities.  Denies chest pain, headaches.  She apparently did have a fall forward and says that her chin hurts where she hit.  She denies any LOC during any of these falls.  Current Outpatient Medications on File Prior to Visit  Medication Sig Dispense Refill  . calcium carbonate (OSCAL) 1500 (600 Ca) MG TABS tablet Take 2,400 mg of elemental calcium by mouth 2 (two) times daily with a meal.    . ALPRAZolam (XANAX) 1 MG tablet TAKE 1 TABLET BY MOUTH TWICE DAILY AS NEEDED FOR SEVERE ANXIETY 60 tablet 2  . amoxicillin (AMOXIL) 500 MG tablet Take 1 tablet (500 mg total) by mouth 2 (two) times daily. 10 tablet 0  . calcitRIOL (ROCALTROL) 0.5 MCG capsule Take 4 capsules by mouth twice daily    . famotidine (PEPCID) 40 MG tablet     . fluconazole (DIFLUCAN) 150 MG tablet Take 1 tablet (150 mg total) by mouth daily. 2 tablet 0  . furosemide (LASIX) 40 MG tablet TAKE 2 TABLETS BY MOUTH TWICE DAILY 180 tablet 1  . hydrOXYzine (VISTARIL) 25 MG capsule TAKE 1 CAPSULE(25 MG) BY MOUTH THREE TIMES DAILY 90 capsule 3  . Iron-FA-B Cmp-C-Biot-Probiotic (FUSION  PLUS) CAPS Take by mouth 2 (two) times daily.     Marland Kitchen lamoTRIgine (LAMICTAL) 200 MG tablet Take 1 at night    . levothyroxine (SYNTHROID) 200 MCG tablet Patient takes 1 tablets M-F and 1/2 Sat and Sun    . midodrine (PROAMATINE) 2.5 MG tablet TAKE 1 TABLET(2.5 MG) BY MOUTH THREE TIMES DAILY WITH MEALS 90 tablet 0  . potassium chloride SA (KLOR-CON) 20 MEQ tablet TAKE 4 TABLETS BY MOUTH TWICE DAILY 240 tablet 2  . promethazine (PHENERGAN) 25 MG tablet TAKE 1 TABLET(25 MG) BY MOUTH FOUR TIMES DAILY AS NEEDED FOR NAUSEA OR VOMITING 40 tablet 2  .  propranolol (INDERAL) 10 MG tablet Take by mouth.    . QUEtiapine (SEROQUEL) 50 MG tablet Take  2 at night    . rifaximin (XIFAXAN) 550 MG TABS tablet Take 550 mg by mouth 2 (two) times daily.    . sertraline (ZOLOFT) 100 MG tablet Take 1 tab twice a day    . spironolactone (ALDACTONE) 50 MG tablet Take 1 tablet (50 mg total) by mouth daily. (Patient taking differently: Take 100 mg by mouth 2 (two) times daily. ) 30 tablet 0  . temazepam (RESTORIL) 30 MG capsule Take 1 to 2 at night    . traMADol (ULTRAM) 50 MG tablet TAKE 2 TABLETS BY MOUTH TWICE DAILY FOR BACK PAIN 120 tablet 0  . triamcinolone (KENALOG) 0.1 % paste APPLY TO THE AFFECTED AREA TWICE DAILY AS DIRECTED 5 g 2  . triamcinolone cream (KENALOG) 0.1 % Apply 1 application topically 2 (two) times daily. 80 g 0   No current facility-administered medications on file prior to visit.   Past Medical History:  Diagnosis Date  . Alcoholic cirrhosis of liver ([place])   . Anemia   . Bipolar disorder current episode depressed ([place])   . CKD (chronic kidney disease)   . Generalized anxiety disorder   . GERD (gastroesophageal reflux disease)   . Hypokalemia   . Intestinal malabsorption   . Korsakoff syndrome ([place])   . Other osteoporosis without current pathological fracture   . Other specified hypothyroidism   . Papillary thyroid carcinoma ([place]) 12/10/2015  . Portal hypertension ([place])   . Stroke [facility])     left basal ganglia stroke (hemorrhagic)  . Vitamin D deficiency    Past Surgical History:  Procedure Laterality Date  . CATARACT EXTRACTION    . GASTRIC BYPASS  2005  . HEMORRHOID SURGERY    . HERNIA REPAIR    . metatarsus abductus surgery Left 1990  . PARTIAL HYSTERECTOMY  05/1997   BL Ovaries remain. Done for fibroids.  . THYROIDECTOMY  03/2013    Family History  Problem Relation Age of Onset  . Osteoporosis Mother   . Cancer Mother        Breast, lung, skin.    Social History   Socioeconomic History  . Marital status:  Married    Spouse name: Not on file  . Number of children: 1  . Years of education: Not on file  . Highest education level: Not on file  Occupational History  . Occupation: Engineer, maintenance (IT)    Comment: Retired.  Tobacco Use  . Smoking status: Never Smoker  . Smokeless tobacco: Never Used  Substance and Sexual Activity  . Alcohol use: Not Currently    Comment: history of alcoholism. sober since 2014.  . Drug use: Never  . Sexual activity: Yes  Other Topics Concern  . Not on file  Social History  Narrative   Right handed   Social Determinants of Health   Financial Resource Strain:   . Difficulty of Paying Living Expenses: Not on file  Food Insecurity:   . Worried About Charity fundraiser in the Last Year: Not on file  . Ran Out of Food in the Last Year: Not on file  Transportation Needs:   . Lack of Transportation (Medical): Not on file  . Lack of Transportation (Non-Medical): Not on file  Physical Activity:   . Days of Exercise per Week: Not on file  . Minutes of Exercise per Session: Not on file  Stress:   . Feeling of Stress : Not on file  Social Connections:   . Frequency of Communication with Friends and Family: Not on file  . Frequency of Social Gatherings with Friends and Family: Not on file  . Attends Religious Services: Not on file  . Active Member of Clubs or Organizations: Not on file  . Attends Archivist Meetings: Not on file  . Marital Status: Not on file    Review of Systems  Constitutional: Positive for fatigue. Negative for chills and fever.  HENT: Negative for congestion, ear pain, rhinorrhea and sore throat.   Respiratory: Positive for shortness of breath. Negative for cough.   Cardiovascular: Positive for leg swelling. Negative for chest pain.  Gastrointestinal: Negative for abdominal pain, constipation, diarrhea, nausea and vomiting.  Endocrine: Negative for polydipsia, polyphagia and polyuria.  Genitourinary: Negative for dysuria and urgency.    Musculoskeletal: Positive for back pain (worsened. ). Negative for myalgias.  Neurological: Positive for dizziness, weakness and light-headedness. Negative for headaches.  Psychiatric/Behavioral: Negative for dysphoric mood. The patient is not nervous/anxious.      Objective:  BP 122/60   Pulse 88   Temp 98 F (36.7 C)   Resp 18   Wt 160 lb 3.2 oz (72.7 kg)   BMI 25.86 kg/m   BP/Weight 05/27/2020 05/09/2020 96/78/9381  Systolic BP 017 510 258  Diastolic BP 60 64 66  Wt. (Lbs) 160.2 153.4 157  BMI 25.86 24.76 25.34    Physical Exam Vitals reviewed.  Constitutional:      Appearance: She is normal weight.  Eyes:     Comments: Pale conjunctive a  Cardiovascular:     Rate and Rhythm: Normal rate and regular rhythm.     Heart sounds: Normal heart sounds.  Pulmonary:     Effort: Pulmonary effort is normal. No respiratory distress.     Breath sounds: Normal breath sounds.  Abdominal:     General: Abdomen is flat. Bowel sounds are normal.     Palpations: Abdomen is soft.     Tenderness: There is no abdominal tenderness.  Musculoskeletal:     Right hand: Decreased strength.     Left hand: Decreased strength.     Cervical back: No tenderness.     Thoracic back: No tenderness.     Lumbar back: Tenderness present.     Right lower leg: Edema present.     Left lower leg: Edema present.     Comments: UE and LE 4/5 diffusely.   Skin:    Capillary Refill: Capillary refill takes less than 2 seconds.     Coloration: Skin is pale.     Findings: Rash (previously vasculitis improved. pigmented skin on lower extremities. ) present.  Neurological:     Mental Status: She is alert.  Psychiatric:        Mood and Affect: Mood normal.  Behavior: Behavior normal.   Gait: using walker. Leans forware. Ecchymosis on chin.    Lab Results  Component Value Date   WBC 16.7 05/17/2020   HGB 8.9 (A) 05/17/2020   HCT 28 (A) 05/17/2020   PLT 459 (A) 05/17/2020   GLUCOSE 64 (L)  05/13/2020   CHOL 137 05/08/2020   TRIG 55 05/08/2020   HDL 93 05/08/2020   LDLCALC 32 05/08/2020   ALT 37 (A) 05/17/2020   AST 29 05/17/2020   NA 123 (A) 05/17/2020   K 4.2 05/17/2020   CL 91 (A) 05/17/2020   CREATININE 2.0 (A) 05/17/2020   BUN 45 (A) 05/17/2020   CO2 23 (A) 05/17/2020   TSH 2.050 04/16/2020      Assessment:  1. Frequent falls 2. Imbalance 3. Hyponatremia 4. Hypoalbuminemia 5. Anemia in other chronic diseases classified elsewhere 6. Severe protein-calorie malnutrition ([place]) 7. Alcoholic cirrhosis of liver with ascites ([place]) 8. Other specified intestinal malabsorption 9. Traumatic injury of head, initial encounter 10. Hemochromatosis carrier   Plan: Patient sent to [facility] ED by private MVA for work up for VERY frequent falls (8 in the last week) and imbalance. Multifactorial: Hyponatremia, anemia. May need transfusion or IV fluids for hyponatremia.  Follow-up: after discharged from hospital.  An After Visit Summary was printed and given to the patient.  Rochel Brome [facility] [phone]

## 2020-05-28 ENCOUNTER — Inpatient Hospital Stay: Payer: 59 | Admitting: Oncology

## 2020-05-30 ENCOUNTER — Telehealth: Payer: Self-pay

## 2020-05-30 NOTE — Telephone Encounter (Signed)
Molly Parrish called to report that she was discharged from the hospital yesterday and she is not feeling any better.  She has fallen once since being home.  Her bp has remained low @ 96-98/65-68.  She has a call into Dr. Bobby Rumpf since her hemoglobin remains low.  Dr. Tobie Poet advised that she call Dr. Tamala Julian for a follow-up.  She was scheduled to see Dr. Tobie Poet on Monday afternoon.

## 2020-05-31 ENCOUNTER — Telehealth: Payer: Self-pay | Admitting: Oncology

## 2020-05-31 NOTE — Telephone Encounter (Signed)
Per Dr Rochel Brome, please schedule patient for Hospital Follow up next week with Dr. Bobby Rumpf.  Patient notified of scheduled Hosp F/U on 11/18 at 9:15 am

## 2020-06-03 ENCOUNTER — Ambulatory Visit: Payer: Medicare Other | Admitting: Family Medicine

## 2020-06-03 ENCOUNTER — Telehealth: Payer: Self-pay

## 2020-06-03 DIAGNOSIS — S2242XA Multiple fractures of ribs, left side, initial encounter for closed fracture: Secondary | ICD-10-CM

## 2020-06-03 HISTORY — DX: Multiple fractures of ribs, left side, initial encounter for closed fracture: S22.42XA

## 2020-06-03 NOTE — Telephone Encounter (Signed)
Molly Parrish called to report that her Mother has declined over the week-end.  She is much weaker and she currently is unable to walk on her own. She discussed the possibility of transporting her via EMS to [facility] for evaluation and treatment and Dr.Cox agreed with the plan.

## 2020-06-05 ENCOUNTER — Other Ambulatory Visit: Payer: Self-pay | Admitting: Oncology

## 2020-06-05 DIAGNOSIS — D638 Anemia in other chronic diseases classified elsewhere: Secondary | ICD-10-CM

## 2020-06-05 DIAGNOSIS — N189 Chronic kidney disease, unspecified: Secondary | ICD-10-CM

## 2020-06-05 DIAGNOSIS — D631 Anemia in chronic kidney disease: Secondary | ICD-10-CM

## 2020-06-05 NOTE — Progress Notes (Deleted)
[facility]  [address] [place],  [place]  [address] [phone]  Clinic Day:  06/05/2020  Referring physician: Rochel Brome, MD   HISTORY OF PRESENT ILLNESS:  The patient is a 54 y.o. female   who I recently began seeing for leukocytosis and anemia.  She comes in today to go over all of her recent labs to determine the etiology behind this. Since her last visit, the patient was hospitalized.   She continues to deny having any recent infectious/inflammatory disorders to explain her leukocytosis.    She continues to deny having any overt forms of blood loss to explain her anemia.   PHYSICAL EXAM:  There were no vitals taken for this visit. Wt Readings from Last 3 Encounters:  05/27/20 160 lb 3.2 oz (72.7 kg)  05/09/20 153 lb 6.4 oz (69.6 kg)  05/08/20 157 lb (71.2 kg)   There is no height or weight on file to calculate BMI. Performance status (ECOG): {CHL ONC Q3448304 Physical Exam Constitutional:      Appearance: Normal appearance. She is not ill-appearing.  HENT:     Mouth/Throat:     Mouth: Mucous membranes are moist.     Pharynx: Oropharynx is clear. No oropharyngeal exudate or posterior oropharyngeal erythema.  Cardiovascular:     Rate and Rhythm: Normal rate and regular rhythm.     Heart sounds: No murmur heard.  No friction rub. No gallop.   Pulmonary:     Effort: Pulmonary effort is normal. No respiratory distress.     Breath sounds: Normal breath sounds. No wheezing, rhonchi or rales.  Abdominal:     General: Bowel sounds are normal. There is no distension.     Palpations: Abdomen is soft. There is no mass.     Tenderness: There is no abdominal tenderness.  Musculoskeletal:        General: No swelling.     Right lower leg: No edema.     Left lower leg: No edema.  Lymphadenopathy:     Cervical: No cervical adenopathy.     Upper Body:     Right upper body: No supraclavicular or axillary adenopathy.     Left upper  body: No supraclavicular or axillary adenopathy.     Lower Body: No right inguinal adenopathy. No left inguinal adenopathy.  Skin:    General: Skin is warm.     Coloration: Skin is not jaundiced.     Findings: No lesion or rash.  Neurological:     General: No focal deficit present.     Mental Status: She is alert and oriented to person, place, and time. Mental status is at baseline.     Cranial Nerves: Cranial nerves are intact.  Psychiatric:        Mood and Affect: Mood normal.        Behavior: Behavior normal.        Thought Content: Thought content normal.     LABS:   CBC Latest Ref Rng & Units 05/17/2020 05/13/2020 05/09/2020  WBC - 16.7 15.0(H) 17.1(H)  Hemoglobin 12.0 - 16.0 8.9(A) 8.6(L) 9.1(L)  Hematocrit 36 - 46 28(A) 26.3(L) 27.9(L)  Platelets 150 - 399 459(A) 354 365   CMP Latest Ref Rng & Units 05/17/2020 05/13/2020 05/09/2020  Glucose 65 - 99 mg/dL - 64(L) 81  BUN 4 - 21 45(A) 21 35(H)  Creatinine 0.5 - 1.1 2.0(A) 1.18(H) 1.25(H)  Sodium 137 - 147 123(A) 134 132(L)  Potassium 3.4 - 5.3 4.2  4.7 3.3(L)  Chloride 99 - 108 91(A) 100 96  CO2 13 - 22 23(A) 24 22  Calcium 8.7 - 10.7 7.7(A) 7.5(L) 6.7(LL)  Total Protein 6.0 - 8.5 g/dL - 4.4(LL) 4.3(LL)  Total Bilirubin 0.0 - 1.2 mg/dL - 0.3 0.9  Alkaline Phos 25 - 125 160(A) 138(H) 146(H)  AST 13 - 35 29 28 91(H)  ALT 7 - 35 37(A) 41(H) 52(H)     STUDIES:  No results found.    ASSESSMENT & PLAN:   Assessment/Plan:  A 54 y.o. female with both leukocytosis and anemia.   When evaluating her recent labs, she does not have any nutritional deficiencies factoring into her anemia.  It does appear her kidney disease is the primary culprit behind her anemia.   The patient understands all the plans discussed today and is in agreement with them.      Gabrielly Mccrystal Macarthur Critchley, MD

## 2020-06-06 ENCOUNTER — Ambulatory Visit: Payer: Medicare Other | Admitting: Oncology

## 2020-06-11 ENCOUNTER — Ambulatory Visit: Payer: Medicare Other | Admitting: Family Medicine

## 2020-06-11 DIAGNOSIS — Z9889 Other specified postprocedural states: Secondary | ICD-10-CM | POA: Insufficient documentation

## 2020-06-11 DIAGNOSIS — Z9884 Bariatric surgery status: Secondary | ICD-10-CM | POA: Insufficient documentation

## 2020-06-11 HISTORY — DX: Bariatric surgery status: Z98.84

## 2020-06-12 ENCOUNTER — Other Ambulatory Visit: Payer: Self-pay | Admitting: Family Medicine

## 2020-06-12 DIAGNOSIS — R258 Other abnormal involuntary movements: Secondary | ICD-10-CM | POA: Insufficient documentation

## 2020-06-12 DIAGNOSIS — Z79899 Other long term (current) drug therapy: Secondary | ICD-10-CM | POA: Insufficient documentation

## 2020-06-12 HISTORY — DX: Other long term (current) drug therapy: Z79.899

## 2020-06-27 ENCOUNTER — Ambulatory Visit: Payer: Medicare Other | Admitting: Neurology

## 2020-07-08 ENCOUNTER — Inpatient Hospital Stay: Payer: Medicare Other | Admitting: Family Medicine

## 2020-07-10 ENCOUNTER — Other Ambulatory Visit: Payer: Self-pay | Admitting: Family Medicine

## 2020-07-10 ENCOUNTER — Inpatient Hospital Stay: Payer: Medicare Other | Admitting: Family Medicine

## 2020-07-11 DIAGNOSIS — R531 Weakness: Secondary | ICD-10-CM | POA: Insufficient documentation

## 2020-07-11 HISTORY — DX: Weakness: R53.1

## 2020-07-12 DIAGNOSIS — W1830XA Fall on same level, unspecified, initial encounter: Secondary | ICD-10-CM | POA: Insufficient documentation

## 2020-07-15 ENCOUNTER — Inpatient Hospital Stay: Payer: Medicare Other | Admitting: Family Medicine

## 2020-07-17 ENCOUNTER — Inpatient Hospital Stay: Payer: Medicare Other | Admitting: Family Medicine

## 2020-07-17 ENCOUNTER — Other Ambulatory Visit: Payer: Self-pay

## 2020-07-17 ENCOUNTER — Ambulatory Visit (INDEPENDENT_AMBULATORY_CARE_PROVIDER_SITE_OTHER): Payer: 59 | Admitting: Family Medicine

## 2020-07-17 VITALS — BP 110/64 | HR 80 | Temp 97.3°F | Resp 16 | Ht 66.0 in | Wt 151.2 lb

## 2020-07-17 DIAGNOSIS — R296 Repeated falls: Secondary | ICD-10-CM

## 2020-07-17 DIAGNOSIS — E43 Unspecified severe protein-calorie malnutrition: Secondary | ICD-10-CM

## 2020-07-17 DIAGNOSIS — D638 Anemia in other chronic diseases classified elsewhere: Secondary | ICD-10-CM | POA: Diagnosis not present

## 2020-07-17 DIAGNOSIS — E876 Hypokalemia: Secondary | ICD-10-CM | POA: Diagnosis not present

## 2020-07-17 DIAGNOSIS — R2689 Other abnormalities of gait and mobility: Secondary | ICD-10-CM | POA: Diagnosis not present

## 2020-07-17 DIAGNOSIS — E871 Hypo-osmolality and hyponatremia: Secondary | ICD-10-CM

## 2020-07-17 DIAGNOSIS — R3129 Other microscopic hematuria: Secondary | ICD-10-CM

## 2020-07-17 DIAGNOSIS — Z79899 Other long term (current) drug therapy: Secondary | ICD-10-CM

## 2020-07-17 NOTE — Progress Notes (Signed)
Subjective:  Patient ID: Molly Parrish, female    DOB: 1966/01/30  Age: 54 y.o. MRN: [id number]  Chief Complaint  Patient presents with  . Hospitalization Follow-up    HPI  Patient presents for hospital follow-u at [facility]  She was admitted on July 11, 2020 for a fall and discharged home on December 24.  Patient was having generalized weakness.  The patient had been admitted in November 2021 for a fall thought to be secondary to deconditioning and polypharmacy as well has hyponatremia.  At that time in November she was found to have numerous rib fractures for which she had surgery.  She was discharged to skilled nursing facility on June 12, 2020.  She had been recently discharged from that nursing home on July 01, 2020.  She was home for approximately 1 week and was having significant difficulty with weakness and performing her ADLs.  At her baseline she is using a walker or a wheelchair.   Her work-up during this admission included a CT scan of the brain which showed no significant intracranial abnormalities nor did show a bleed.  Therapy recommended she return to the skilled nursing facility however apparently she was discharged via EMS only to find that there was no bed awaiting her at the facility and she was returned to the emergency department.  It was then agreed upon that she could go home with home physical therapy and Occupational Therapy.  The hospital continues to say that the generalized weakness and deconditioning is due to polypharmacy and perhaps some muscle atrophy.  I was very concerned that she has had persistent hyponatremia which has been the cause over the last 4 to 5 months.  She is also has significant anemia over the last 4 to 5 months receiving packed red blood cells.  Most likely etiology of generalized weakness and deconditioning with recurrent falls is likely secondary to muscle atrophy 2/2 cirrhosis and polypharmacy.  During the  hospitalization her Xanax was decreased and her temazepam, tramadol and hydroxyzine were held.  Per report her alertness was better and she was able to do her therapy better.   Other issues: Microscopic hematuria in the emergency department.  A repeat urinalysis was recommended.   Patient also has hyponatremia, hypoalbuminemia, and hypomagnesemia.  Patient was discharged on magnesium oxide 400 mg once daily.  She was recommended a high-protein diet, multivitamin daily, B1 100 mg once daily, B2 once daily, B12 1000 mg daily.   Patient has had issues with hypotension and I had put her on midodrine 2.5 mg 3 times a day approximately 3 months ago.  She was discharged home on this.  Her blood pressures running 90-110/50-60.  The midodrine 2.5mg  3 times daily was continued.  Current Outpatient Medications on File Prior to Visit  Medication Sig Dispense Refill  . ALPRAZolam (XANAX) 0.25 MG tablet Take 0.25 mg by mouth 2 (two) times daily as needed.    . calcitRIOL (ROCALTROL) 0.5 MCG capsule Take 4 capsules by mouth twice daily    . calcium carbonate (OSCAL) 1500 (600 Ca) MG TABS tablet Take 2,400 mg of elemental calcium by mouth 2 (two) times daily with a meal.    . famotidine (PEPCID) 40 MG tablet Take 40 mg by mouth at bedtime.    . Iron-FA-B Cmp-C-Biot-Probiotic (FUSION PLUS) CAPS Take by mouth 2 (two) times daily.     . Iron-Vitamins (GERITOL COMPLETE) TABS Take 1 tablet by mouth daily.    Marland Kitchen lamoTRIgine (  LAMICTAL) 200 MG tablet Take 1 at night    . levothyroxine (SYNTHROID) 200 MCG tablet Patient takes 1 tablets M-F and 1/2 Sat and Sun    . magnesium oxide (MAG-OX) 400 MG tablet Take by mouth.    Marland Kitchen omeprazole (PRILOSEC) 40 MG capsule Take 1 capsule by mouth at bedtime.    . potassium chloride SA (KLOR-CON) 20 MEQ tablet TAKE 4 TABLETS BY MOUTH TWICE DAILY (Patient taking differently: Take 20 mEq by mouth daily.) 240 tablet 2  . promethazine (PHENERGAN) 25 MG tablet TAKE 1 TABLET(25 MG) BY MOUTH FOUR  TIMES DAILY AS NEEDED FOR NAUSEA OR VOMITING 40 tablet 2  . propranolol (INDERAL) 10 MG tablet Take 10 mg by mouth at bedtime.    Marland Kitchen QUEtiapine (SEROQUEL) 50 MG tablet Take  2 at night    . rifaximin (XIFAXAN) 550 MG TABS tablet Take 550 mg by mouth 2 (two) times daily.    . sertraline (ZOLOFT) 100 MG tablet Take 1 tab twice a day    . temazepam (RESTORIL) 30 MG capsule Take 30 mg by mouth at bedtime as needed.    . thiamine 100 MG tablet Take by mouth.    . cyanocobalamin 1000 MCG tablet Take 1,000 mcg by mouth at bedtime.    . furosemide (LASIX) 40 MG tablet TAKE 2 TABLETS BY MOUTH TWICE DAILY (Patient taking differently: Take 40 mg by mouth daily.) 180 tablet 1  . topiramate (TOPAMAX) 50 MG tablet Take 50 mg by mouth at bedtime.     No current facility-administered medications on file prior to visit.   Past Medical History:  Diagnosis Date  . Alcoholic cirrhosis of liver ([place])   . Anemia   . Bipolar disorder current episode depressed ([place])   . CKD (chronic kidney disease)   . Generalized anxiety disorder   . GERD (gastroesophageal reflux disease)   . Hypokalemia   . Intestinal malabsorption   . Korsakoff syndrome ([place])   . Other osteoporosis without current pathological fracture   . Other specified hypothyroidism   . Papillary thyroid carcinoma ([place]) 12/10/2015  . Portal hypertension ([place])   . Stroke [facility])     left basal ganglia stroke (hemorrhagic)  . Vitamin D deficiency    Past Surgical History:  Procedure Laterality Date  . CATARACT EXTRACTION    . GASTRIC BYPASS  2005  . HEMORRHOID SURGERY    . HERNIA REPAIR    . metatarsus abductus surgery Left 1990  . PARTIAL HYSTERECTOMY  05/1997   BL Ovaries remain. Done for fibroids.  . THYROIDECTOMY  03/2013    Family History  Problem Relation Age of Onset  . Osteoporosis Mother   . Cancer Mother        Breast, lung, skin.    Social History   Socioeconomic History  . Marital status: Married    Spouse name: Not on file   . Number of children: 1  . Years of education: Not on file  . Highest education level: Not on file  Occupational History  . Occupation: Engineer, maintenance (IT)    Comment: Retired.  Tobacco Use  . Smoking status: Never Smoker  . Smokeless tobacco: Never Used  Substance and Sexual Activity  . Alcohol use: Not Currently    Comment: history of alcoholism. sober since 2014.  . Drug use: Never  . Sexual activity: Yes  Other Topics Concern  . Not on file  Social History Narrative   Right handed   Social Determinants of Health  Financial Resource Strain: Not on file  Food Insecurity: Not on file  Transportation Needs: Not on file  Physical Activity: Not on file  Stress: Not on file  Social Connections: Not on file    Review of Systems  Constitutional: Positive for fatigue. Negative for chills and fever.  HENT: Negative for congestion, rhinorrhea and sore throat.   Respiratory: Positive for shortness of breath. Negative for cough.   Cardiovascular: Positive for leg swelling. Negative for chest pain and palpitations.  Gastrointestinal: Negative for abdominal distention, abdominal pain, constipation, diarrhea, nausea and vomiting.  Genitourinary: Negative for dysuria and urgency.  Musculoskeletal: Positive for back pain. Negative for myalgias.  Neurological: Positive for weakness and light-headedness. Negative for dizziness and headaches.  Psychiatric/Behavioral: Negative for dysphoric mood. The patient is not nervous/anxious.      Objective:  BP 110/64   Pulse 80   Temp (!) 97.3 F (36.3 C)   Resp 16   Ht 5\' 6"  (1.676 m)   Wt 151 lb 3.2 oz (68.6 kg)   BMI 24.40 kg/m   BP/Weight 07/17/2020 05/27/2020 [id number]  Systolic BP [id number]  Diastolic BP 64 60 64  Wt. (Lbs) 151.2 160.2 153.4  BMI 24.4 25.86 24.76    Physical Exam Vitals reviewed.  Constitutional:      Appearance: She is normal weight.  Neck:     Vascular: No carotid bruit.  Cardiovascular:     Rate and Rhythm:  Normal rate and regular rhythm.     Pulses: Normal pulses.     Heart sounds: Normal heart sounds.  Pulmonary:     Effort: Pulmonary effort is normal. No respiratory distress.     Breath sounds: Normal breath sounds.  Abdominal:     General: Abdomen is flat. Bowel sounds are normal.     Palpations: Abdomen is soft.     Tenderness: There is no abdominal tenderness.  Musculoskeletal:     Right lower leg: No edema.     Left lower leg: No edema.     Comments: Using a walker   Skin:    Comments: Incision posterior left back. C/D/I.  Neurological:     Mental Status: She is alert and oriented to person, place, and time.  Psychiatric:        Mood and Affect: Mood normal.        Behavior: Behavior normal.     Diabetic Foot Exam - Simple   No data filed      Lab Results  Component Value Date   WBC 8.4 07/17/2020   HGB 11.2 07/17/2020   HCT 34.3 07/17/2020   PLT 349 07/17/2020   GLUCOSE 79 07/17/2020   CHOL 137 05/08/2020   TRIG 55 05/08/2020   HDL 93 05/08/2020   LDLCALC 32 05/08/2020   ALT 37 (H) 07/17/2020   AST 32 07/17/2020   NA 137 07/17/2020   K 4.1 07/17/2020   CL 96 07/17/2020   CREATININE 1.33 (H) 07/17/2020   BUN 21 07/17/2020   CO2 30 (H) 07/17/2020   TSH 2.050 04/16/2020      Assessment & Plan:   1. Hypokalemia - Comprehensive metabolic panel  2. Hypomagnesemia - Magnesium  3. Anemia, chronic disease - CBC with Differential/Platelet  4. Imbalance Continue with home PT/OT.  5. Falls frequently Caution recommended. Pt should use walker until therapists say ok to go without.   6. Microscopic hematuria Recheck UA at next visit.   7. Polypharmacy Agree with decreased xanax and seroquel.  Pt decreased temazepam to 30 mg once at night. My preference would be for her to not be on this. Will defer to psychiatry.  8. Hyponatremia - CMP  9. Severe protein-calorie malnutrition ([place])  - Continue high protein diet.  Continue numerous MVIs.  Meds  ordered this encounter  Medications  . spironolactone (ALDACTONE) 50 MG tablet    Sig: Take 1 tablet (50 mg total) by mouth 2 (two) times daily.    Dispense:  60 tablet    Refill:  0    Orders Placed This Encounter  Procedures  . CBC with Differential/Platelet  . Comprehensive metabolic panel  . Magnesium    Follow-up: Return in about 4 weeks (around 08/14/2020).  An After Visit Summary was printed and given to the patient.  Rochel Brome, MD [facility] [phone]

## 2020-07-18 ENCOUNTER — Other Ambulatory Visit: Payer: Self-pay | Admitting: Family Medicine

## 2020-07-18 LAB — CBC WITH DIFFERENTIAL/PLATELET
Basophils Absolute: 0.1 10*3/uL (ref 0.0–0.2)
Basos: 1 %
EOS (ABSOLUTE): 0.2 10*3/uL (ref 0.0–0.4)
Eos: 2 %
Hematocrit: 34.3 % (ref 34.0–46.6)
Hemoglobin: 11.2 g/dL (ref 11.1–15.9)
Immature Grans (Abs): 0 10*3/uL (ref 0.0–0.1)
Immature Granulocytes: 0 %
Lymphocytes Absolute: 1.7 10*3/uL (ref 0.7–3.1)
Lymphs: 20 %
MCH: 30.5 pg (ref 26.6–33.0)
MCHC: 32.7 g/dL (ref 31.5–35.7)
MCV: 94 fL (ref 79–97)
Monocytes Absolute: 0.7 10*3/uL (ref 0.1–0.9)
Monocytes: 8 %
Neutrophils Absolute: 5.8 10*3/uL (ref 1.4–7.0)
Neutrophils: 69 %
Platelets: 349 10*3/uL (ref 150–450)
RBC: 3.67 x10E6/uL — ABNORMAL LOW (ref 3.77–5.28)
RDW: 12.7 % (ref 11.7–15.4)
WBC: 8.4 10*3/uL (ref 3.4–10.8)

## 2020-07-18 LAB — COMPREHENSIVE METABOLIC PANEL
ALT: 37 IU/L — ABNORMAL HIGH (ref 0–32)
AST: 32 IU/L (ref 0–40)
Albumin/Globulin Ratio: 1.1 — ABNORMAL LOW (ref 1.2–2.2)
Albumin: 2.6 g/dL — ABNORMAL LOW (ref 3.8–4.9)
Alkaline Phosphatase: 163 IU/L — ABNORMAL HIGH (ref 44–121)
BUN/Creatinine Ratio: 16 (ref 9–23)
BUN: 21 mg/dL (ref 6–24)
Bilirubin Total: 0.3 mg/dL (ref 0.0–1.2)
CO2: 30 mmol/L — ABNORMAL HIGH (ref 20–29)
Calcium: 9.3 mg/dL (ref 8.7–10.2)
Chloride: 96 mmol/L (ref 96–106)
Creatinine, Ser: 1.33 mg/dL — ABNORMAL HIGH (ref 0.57–1.00)
GFR calc Af Amer: 52 mL/min/{1.73_m2} — ABNORMAL LOW (ref >59)
GFR calc non Af Amer: 45 mL/min/{1.73_m2} — ABNORMAL LOW (ref >59)
Globulin, Total: 2.4 g/dL (ref 1.5–4.5)
Glucose: 79 mg/dL (ref 65–99)
Potassium: 4.1 mmol/L (ref 3.5–5.2)
Sodium: 137 mmol/L (ref 134–144)
Total Protein: 5 g/dL — ABNORMAL LOW (ref 6.0–8.5)

## 2020-07-18 LAB — MAGNESIUM: Magnesium: 1.8 mg/dL (ref 1.6–2.3)

## 2020-07-22 ENCOUNTER — Encounter: Payer: Self-pay | Admitting: Family Medicine

## 2020-07-22 ENCOUNTER — Telehealth: Payer: Self-pay

## 2020-07-22 MED ORDER — SPIRONOLACTONE 50 MG PO TABS: 50.0000 mg | ORAL_TABLET | Freq: Two times a day (BID) | ORAL | 0 refills | Status: DC

## 2020-07-22 NOTE — Telephone Encounter (Signed)
Plan of care approved for Home Health Physical Therapy.

## 2020-07-23 ENCOUNTER — Other Ambulatory Visit: Payer: Self-pay | Admitting: Family Medicine

## 2020-07-24 ENCOUNTER — Encounter: Payer: Self-pay | Admitting: Family Medicine

## 2020-07-25 ENCOUNTER — Other Ambulatory Visit: Payer: Self-pay | Admitting: Family Medicine

## 2020-07-29 DIAGNOSIS — M6250 Muscle wasting and atrophy, not elsewhere classified, unspecified site: Secondary | ICD-10-CM | POA: Diagnosis not present

## 2020-07-29 DIAGNOSIS — R296 Repeated falls: Secondary | ICD-10-CM | POA: Diagnosis not present

## 2020-07-29 DIAGNOSIS — S2242XD Multiple fractures of ribs, left side, subsequent encounter for fracture with routine healing: Secondary | ICD-10-CM | POA: Diagnosis not present

## 2020-07-29 DIAGNOSIS — K7030 Alcoholic cirrhosis of liver without ascites: Secondary | ICD-10-CM | POA: Diagnosis not present

## 2020-08-08 ENCOUNTER — Other Ambulatory Visit: Payer: Self-pay | Admitting: Physician Assistant

## 2020-08-13 ENCOUNTER — Other Ambulatory Visit: Payer: Self-pay | Admitting: Family Medicine

## 2020-08-14 ENCOUNTER — Other Ambulatory Visit: Payer: Self-pay | Admitting: Family Medicine

## 2020-08-14 MED ORDER — ALPRAZOLAM 0.25 MG PO TABS: 0.2500 mg | ORAL_TABLET | Freq: Two times a day (BID) | ORAL | 1 refills | Status: DC | PRN

## 2020-08-17 NOTE — Progress Notes (Signed)
Subjective:  Patient ID: Molly Parrish, female    DOB: June 17, 1966  Age: 55 y.o. MRN: [id number]  Chief Complaint  Patient presents with  . Anemia    HPI Recurrent falls: 3 in the last month. Fell in the bathroom twice and once in the kitchen. Hit her temple in the bathroom. Home PT is coming out once weekly and OT comes once weekly. Has patient doing exercises for balance.   Kidney dysfunction improved on 07/17/2020. Anemia had improved from 8.9 to 11.2.  Depression worsened.Sleeping more. Tired all the time. Patient sees Dr. Erling Cruz.   Current Outpatient Medications on File Prior to Visit  Medication Sig Dispense Refill  . ALPRAZolam (XANAX) 0.25 MG tablet Take 1 tablet (0.25 mg total) by mouth 2 (two) times daily as needed. 60 tablet 1  . calcitRIOL (ROCALTROL) 0.5 MCG capsule Take 4 capsules by mouth twice daily    . calcium carbonate (OSCAL) 1500 (600 Ca) MG TABS tablet Take 2,400 mg of elemental calcium by mouth 2 (two) times daily with a meal.    . cyanocobalamin 1000 MCG tablet Take 1,000 mcg by mouth at bedtime.    . famotidine (PEPCID) 40 MG tablet Take 40 mg by mouth at bedtime.    . furosemide (LASIX) 40 MG tablet TAKE 2 TABLETS BY MOUTH TWICE DAILY 360 tablet 1  . Iron-FA-B Cmp-C-Biot-Probiotic (FUSION PLUS) CAPS Take by mouth 2 (two) times daily.     . Iron-Vitamins (GERITOL COMPLETE) TABS Take 1 tablet by mouth daily.    Marland Kitchen lamoTRIgine (LAMICTAL) 200 MG tablet Take 1 at night    . levothyroxine (SYNTHROID) 200 MCG tablet Patient takes 1 tablets M-F and 1/2 Sat and Sun    . magnesium oxide (MAG-OX) 400 MG tablet Take by mouth.    . midodrine (PROAMATINE) 2.5 MG tablet TAKE 1 TABLET(2.5 MG) BY MOUTH THREE TIMES DAILY WITH MEALS 90 tablet 0  . nystatin (MYCOSTATIN) 100000 UNIT/ML suspension TAKE 5 ML BY MOUTH FOUR TIMES DAILY X 7 DAYS 473 mL 1  . omeprazole (PRILOSEC) 40 MG capsule Take 1 capsule by mouth at bedtime.    . potassium chloride SA (KLOR-CON) 20 MEQ tablet TAKE  4 TABLETS BY MOUTH TWICE DAILY (Patient taking differently: Take 20 mEq by mouth daily.) 240 tablet 2  . promethazine (PHENERGAN) 25 MG tablet TAKE 1 TABLET(25 MG) BY MOUTH FOUR TIMES DAILY AS NEEDED FOR NAUSEA OR VOMITING 40 tablet 2  . propranolol (INDERAL) 10 MG tablet Take 10 mg by mouth at bedtime.    Marland Kitchen QUEtiapine (SEROQUEL) 50 MG tablet Take  2 at night    . rifaximin (XIFAXAN) 550 MG TABS tablet Take 550 mg by mouth 2 (two) times daily.    . sertraline (ZOLOFT) 100 MG tablet Take 1 tab twice a day    . spironolactone (ALDACTONE) 50 MG tablet Take 1 tablet (50 mg total) by mouth 2 (two) times daily. 60 tablet 0  . temazepam (RESTORIL) 30 MG capsule Take 30 mg by mouth at bedtime as needed.    . thiamine 100 MG tablet Take by mouth.    . topiramate (TOPAMAX) 50 MG tablet Take 50 mg by mouth at bedtime.    . traMADol (ULTRAM) 50 MG tablet TAKE 2 TABLETS BY MOUTH TWICE DAILY FOR BACK PAIN 120 tablet 0   No current facility-administered medications on file prior to visit.   Past Medical History:  Diagnosis Date  . Alcoholic cirrhosis of liver ([place])   .  Anemia   . Bipolar disorder current episode depressed ([place])   . CKD (chronic kidney disease)   . Generalized anxiety disorder   . GERD (gastroesophageal reflux disease)   . Hypokalemia   . Intestinal malabsorption   . Korsakoff syndrome ([place])   . Other osteoporosis without current pathological fracture   . Other specified hypothyroidism   . Papillary thyroid carcinoma ([place]) 12/10/2015  . Portal hypertension ([place])   . Stroke [facility])     left basal ganglia stroke (hemorrhagic)  . Vitamin D deficiency    Past Surgical History:  Procedure Laterality Date  . CATARACT EXTRACTION    . GASTRIC BYPASS  2005  . HEMORRHOID SURGERY    . HERNIA REPAIR    . metatarsus abductus surgery Left 1990  . PARTIAL HYSTERECTOMY  05/1997   BL Ovaries remain. Done for fibroids.  . THYROIDECTOMY  03/2013    Family History  Problem Relation Age of  Onset  . Osteoporosis Mother   . Cancer Mother        Breast, lung, skin.    Social History   Socioeconomic History  . Marital status: Married    Spouse name: Not on file  . Number of children: 1  . Years of education: Not on file  . Highest education level: Not on file  Occupational History  . Occupation: Engineer, maintenance (IT)    Comment: Retired.  Tobacco Use  . Smoking status: Never Smoker  . Smokeless tobacco: Never Used  Substance and Sexual Activity  . Alcohol use: Not Currently    Comment: history of alcoholism. sober since 2014.  . Drug use: Never  . Sexual activity: Yes  Other Topics Concern  . Not on file  Social History Narrative   Right handed   Social Determinants of Health   Financial Resource Strain: Not on file  Food Insecurity: Not on file  Transportation Needs: Not on file  Physical Activity: Not on file  Stress: Not on file  Social Connections: Not on file    Review of Systems  Constitutional: Negative for chills, fatigue and fever.  HENT: Negative for congestion, rhinorrhea and sore throat.   Respiratory: Positive for cough. Negative for shortness of breath.   Cardiovascular: Negative for chest pain and palpitations.  Gastrointestinal: Negative for abdominal pain, constipation, diarrhea, nausea and vomiting.  Genitourinary: Negative for dysuria and urgency.  Musculoskeletal: Negative for back pain and myalgias.  Neurological: Positive for light-headedness. Negative for dizziness, weakness and headaches.       Balance off   Psychiatric/Behavioral: Positive for dysphoric mood. The patient is not nervous/anxious.      Objective:  BP 104/60   Pulse 84   Temp (!) 96.9 F (36.1 C)   Resp 18   Ht 5\' 6"  (1.676 m)   Wt 144 lb (65.3 kg)   BMI 23.24 kg/m   BP/Weight 08/19/2020 07/17/2020 40/03/8118  Systolic BP 147 829 562  Diastolic BP 60 64 60  Wt. (Lbs) 144 151.2 160.2  BMI 23.24 24.4 25.86    Physical Exam Vitals reviewed.  Constitutional:       Appearance: Normal appearance.  Cardiovascular:     Rate and Rhythm: Normal rate and regular rhythm.     Heart sounds: Normal heart sounds.  Pulmonary:     Effort: Pulmonary effort is normal.     Breath sounds: Normal breath sounds.  Abdominal:     Palpations: Abdomen is soft.     Tenderness: There is no abdominal tenderness.  Musculoskeletal:     Comments: Gait abnormal. Wide based. Using a walker.   Neurological:     Mental Status: She is alert and oriented to person, place, and time.  Psychiatric:        Mood and Affect: Mood normal.        Behavior: Behavior normal.     Lab Results  Component Value Date   WBC 8.4 07/17/2020   HGB 11.2 07/17/2020   HCT 34.3 07/17/2020   PLT 349 07/17/2020   GLUCOSE 79 07/17/2020   CHOL 137 05/08/2020   TRIG 55 05/08/2020   HDL 93 05/08/2020   LDLCALC 32 05/08/2020   ALT 37 (H) 07/17/2020   AST 32 07/17/2020   NA 137 07/17/2020   K 4.1 07/17/2020   CL 96 07/17/2020   CREATININE 1.33 (H) 07/17/2020   BUN 21 07/17/2020   CO2 30 (H) 07/17/2020   TSH 2.050 04/16/2020      Assessment & Plan:   1. Hypokalemia - Comprehensive metabolic panel  2. Anemia, chronic disease - CBC with Differential/Platelet  3. Severe protein-calorie malnutrition ([place])  Recommend continue to eat regularly.  4. Frequent falls Continue physical therapy.  Decrease mirtazepine to 15 mg once daily at night. Gradually weaning.   5. Imbalance  See above,  6. Moderate recurrent depression I recommend the patient call her psychiatrist to get an earlier appointment or adjust her medications.   Meds ordered this encounter  Medications  . mirtazapine (REMERON) 15 MG tablet    Sig: Take 1 tablet (15 mg total) by mouth at bedtime.    Dispense:  30 tablet    Refill:  0    Orders Placed This Encounter  Procedures  . CBC with Differential/Platelet  . Comprehensive metabolic panel     Follow-up: Return in about 6 weeks (around 09/30/2020).  An After  Visit Summary was printed and given to the patient.  Rochel Brome, MD [facility] [phone]

## 2020-08-19 ENCOUNTER — Other Ambulatory Visit: Payer: Self-pay

## 2020-08-19 ENCOUNTER — Encounter: Payer: Self-pay | Admitting: Family Medicine

## 2020-08-19 ENCOUNTER — Ambulatory Visit (INDEPENDENT_AMBULATORY_CARE_PROVIDER_SITE_OTHER): Payer: 59 | Admitting: Family Medicine

## 2020-08-19 VITALS — BP 104/60 | HR 84 | Temp 96.9°F | Resp 18 | Ht 66.0 in | Wt 144.0 lb

## 2020-08-19 DIAGNOSIS — R3129 Other microscopic hematuria: Secondary | ICD-10-CM

## 2020-08-19 DIAGNOSIS — D638 Anemia in other chronic diseases classified elsewhere: Secondary | ICD-10-CM

## 2020-08-19 DIAGNOSIS — R2689 Other abnormalities of gait and mobility: Secondary | ICD-10-CM

## 2020-08-19 DIAGNOSIS — E876 Hypokalemia: Secondary | ICD-10-CM

## 2020-08-19 DIAGNOSIS — E43 Unspecified severe protein-calorie malnutrition: Secondary | ICD-10-CM

## 2020-08-19 DIAGNOSIS — R296 Repeated falls: Secondary | ICD-10-CM

## 2020-08-19 DIAGNOSIS — F331 Major depressive disorder, recurrent, moderate: Secondary | ICD-10-CM

## 2020-08-19 MED ORDER — MIRTAZAPINE 15 MG PO TABS: 15.0000 mg | ORAL_TABLET | Freq: Every day | ORAL | 0 refills | Status: DC

## 2020-08-20 LAB — COMPREHENSIVE METABOLIC PANEL
ALT: 25 IU/L (ref 0–32)
AST: 24 IU/L (ref 0–40)
Albumin/Globulin Ratio: 1.2 (ref 1.2–2.2)
Albumin: 2.6 g/dL — ABNORMAL LOW (ref 3.8–4.9)
Alkaline Phosphatase: 171 IU/L — ABNORMAL HIGH (ref 44–121)
BUN/Creatinine Ratio: 20 (ref 9–23)
BUN: 25 mg/dL — ABNORMAL HIGH (ref 6–24)
Bilirubin Total: 0.3 mg/dL (ref 0.0–1.2)
CO2: 28 mmol/L (ref 20–29)
Calcium: 8.8 mg/dL (ref 8.7–10.2)
Chloride: 95 mmol/L — ABNORMAL LOW (ref 96–106)
Creatinine, Ser: 1.26 mg/dL — ABNORMAL HIGH (ref 0.57–1.00)
GFR calc Af Amer: 55 mL/min/{1.73_m2} — ABNORMAL LOW (ref >59)
GFR calc non Af Amer: 48 mL/min/{1.73_m2} — ABNORMAL LOW (ref >59)
Globulin, Total: 2.2 g/dL (ref 1.5–4.5)
Glucose: 79 mg/dL (ref 65–99)
Potassium: 4.1 mmol/L (ref 3.5–5.2)
Sodium: 132 mmol/L — ABNORMAL LOW (ref 134–144)
Total Protein: 4.8 g/dL — ABNORMAL LOW (ref 6.0–8.5)

## 2020-08-20 LAB — CBC WITH DIFFERENTIAL/PLATELET
Basophils Absolute: 0.1 10*3/uL (ref 0.0–0.2)
Basos: 1 %
EOS (ABSOLUTE): 0.1 10*3/uL (ref 0.0–0.4)
Eos: 1 %
Hematocrit: 32.3 % — ABNORMAL LOW (ref 34.0–46.6)
Hemoglobin: 10.7 g/dL — ABNORMAL LOW (ref 11.1–15.9)
Immature Grans (Abs): 0.1 10*3/uL (ref 0.0–0.1)
Immature Granulocytes: 1 %
Lymphocytes Absolute: 2 10*3/uL (ref 0.7–3.1)
Lymphs: 22 %
MCH: 30.5 pg (ref 26.6–33.0)
MCHC: 33.1 g/dL (ref 31.5–35.7)
MCV: 92 fL (ref 79–97)
Monocytes Absolute: 0.8 10*3/uL (ref 0.1–0.9)
Monocytes: 8 %
Neutrophils Absolute: 6.1 10*3/uL (ref 1.4–7.0)
Neutrophils: 67 %
Platelets: 372 10*3/uL (ref 150–450)
RBC: 3.51 x10E6/uL — ABNORMAL LOW (ref 3.77–5.28)
RDW: 13.5 % (ref 11.7–15.4)
WBC: 9.1 10*3/uL (ref 3.4–10.8)

## 2020-08-23 ENCOUNTER — Ambulatory Visit: Payer: Medicare Other

## 2020-08-26 ENCOUNTER — Telehealth: Payer: Self-pay

## 2020-08-26 ENCOUNTER — Ambulatory Visit (INDEPENDENT_AMBULATORY_CARE_PROVIDER_SITE_OTHER): Payer: 59

## 2020-08-26 ENCOUNTER — Other Ambulatory Visit: Payer: Self-pay

## 2020-08-26 DIAGNOSIS — Z23 Encounter for immunization: Secondary | ICD-10-CM

## 2020-08-26 NOTE — Patient Instructions (Signed)
Zoster Vaccine, Recombinant injection What is this medicine? ZOSTER VACCINE (ZOS ter vak SEEN) is a vaccine used to reduce the risk of getting shingles. This vaccine is not used to treat shingles or nerve pain from shingles. This medicine may be used for other purposes; ask your health care provider or pharmacist if you have questions. COMMON BRAND NAME(S): [facility] What should I tell my health care provider before I take this medicine? They need to know if you have any of these conditions:  cancer  immune system problems  an unusual or allergic reaction to Zoster vaccine, other medications, foods, dyes, or preservatives  pregnant or trying to get pregnant  breast-feeding How should I use this medicine? This vaccine is injected into a muscle. It is given by a health care provider. A copy of Vaccine Information Statements will be given before each vaccination. Be sure to read this information carefully each time. This sheet may change often. Talk to your health care provider about the use of this vaccine in children. This vaccine is not approved for use in children. Overdosage: If you think you have taken too much of this medicine contact a poison control center or emergency room at once. NOTE: This medicine is only for you. Do not share this medicine with others. What if I miss a dose? Keep appointments for follow-up (booster) doses. It is important not to miss your dose. Call your health care provider if you are unable to keep an appointment. What may interact with this medicine?  medicines that suppress your immune system  medicines to treat cancer  steroid medicines like prednisone or cortisone This list may not describe all possible interactions. Give your health care provider a list of all the medicines, herbs, non-prescription drugs, or dietary supplements you use. Also tell them if you smoke, drink alcohol, or use illegal drugs. Some items may interact with your medicine. What  should I watch for while using this medicine? Visit your health care provider regularly. This vaccine, like all vaccines, may not fully protect everyone. What side effects may I notice from receiving this medicine? Side effects that you should report to your doctor or health care professional as soon as possible:  allergic reactions (skin rash, itching or hives; swelling of the face, lips, or tongue)  trouble breathing Side effects that usually do not require medical attention (report these to your doctor or health care professional if they continue or are bothersome):  chills  headache  fever  nausea  pain, redness, or irritation at site where injected  tiredness  vomiting This list may not describe all possible side effects. Call your doctor for medical advice about side effects. You may report side effects to FDA at 1-800-FDA-1088. Where should I keep my medicine? This vaccine is only given by a health care provider. It will not be stored at home. NOTE: This sheet is a summary. It may not cover all possible information. If you have questions about this medicine, talk to your doctor, pharmacist, or health care provider.  2021 Elsevier/Gold Standard (2019-08-11 16:23:07)

## 2020-08-26 NOTE — Progress Notes (Addendum)
Molly Parrish came in today for a Prolia and Shingrix injection.  Patient advised that we do not give these vaccines together, however, the authorization has not yet been approved for Prolia.  Patient was given the Shingrix vaccine today as she states that her insurance told her it would be covered in the office.  A consent was signed by the patient and the immunization was given in the left deltoid.  She tolerated it well.  Patient will be notified of decision made by [employer]/Prolia.  Shelle Iron, LPN 0/2/63

## 2020-08-26 NOTE — Telephone Encounter (Signed)
Molly Parrish called to see if there is anything different that can be used for her sore mouth and tongue.  She has used nystatin and magic mouthwash with very little relief.

## 2020-08-26 NOTE — Telephone Encounter (Signed)
Try to do a food log to determine if certain foods make it worse.  And Recommend refer to ENT. kc

## 2020-08-27 ENCOUNTER — Other Ambulatory Visit: Payer: Self-pay

## 2020-08-27 DIAGNOSIS — K1370 Unspecified lesions of oral mucosa: Secondary | ICD-10-CM

## 2020-08-27 NOTE — Telephone Encounter (Signed)
Called pt. Pt VU and agreed with ENT referral. Will put in referral.

## 2020-08-31 ENCOUNTER — Other Ambulatory Visit: Payer: Self-pay | Admitting: Family Medicine

## 2020-09-10 ENCOUNTER — Other Ambulatory Visit: Payer: Self-pay | Admitting: Physician Assistant

## 2020-09-10 ENCOUNTER — Telehealth: Payer: Self-pay

## 2020-09-10 DIAGNOSIS — M6250 Muscle wasting and atrophy, not elsewhere classified, unspecified site: Secondary | ICD-10-CM | POA: Diagnosis not present

## 2020-09-10 DIAGNOSIS — R296 Repeated falls: Secondary | ICD-10-CM | POA: Diagnosis not present

## 2020-09-10 DIAGNOSIS — S2242XD Multiple fractures of ribs, left side, subsequent encounter for fracture with routine healing: Secondary | ICD-10-CM | POA: Diagnosis not present

## 2020-09-10 DIAGNOSIS — K7030 Alcoholic cirrhosis of liver without ascites: Secondary | ICD-10-CM | POA: Diagnosis not present

## 2020-09-10 NOTE — Telephone Encounter (Signed)
Molly Parrish, [place], left message requesting orders for reverification. States she is still having "deficets with dynamic balance and higher level ADLs."   Attempted to call Molly Parrish back. No answer, left VM with name and pt name/DOB for call back for more information so verbal can be given.   Harrell Lark 09/10/20 3:57 PM

## 2020-09-11 ENCOUNTER — Telehealth: Payer: Self-pay | Admitting: Oncology

## 2020-09-11 NOTE — Telephone Encounter (Signed)
Spoke to [place], [place]. He states pt is overall doing well but still is having issues with tub transfer and standing tolerance. He would like to see her once a week for 4-5 more weeks. CMA gave verbal for this. He has to do reverification visit due to pt being at end of 60 day period.   Royce Macadamia, [place] 09/11/20 5:02 PM

## 2020-09-11 NOTE — Telephone Encounter (Signed)
09/11/20 Spoke with patient and sched appt

## 2020-09-12 NOTE — Telephone Encounter (Signed)
Agree. Molly Parrish

## 2020-09-22 ENCOUNTER — Other Ambulatory Visit: Payer: Self-pay | Admitting: Oncology

## 2020-09-22 DIAGNOSIS — D638 Anemia in other chronic diseases classified elsewhere: Secondary | ICD-10-CM

## 2020-09-22 DIAGNOSIS — D539 Nutritional anemia, unspecified: Secondary | ICD-10-CM

## 2020-09-22 NOTE — Progress Notes (Signed)
[facility] [place]  [address] [place],  [place]  [address] [phone]  Clinic Day:  09/23/2020  Referring physician: Rochel Brome, MD   HISTORY OF PRESENT ILLNESS:  The patient is a 55 y.o. female who I recently began seeing for leukocytosis and anemia.  She comes in today to reassess her peripheral counts.  Since her last visit, she has been hospitalized twice.  One time was for acute renal insufficiency.  Another time was for severe rib fractures that required surgical correction.  Overall, the patient claims to feel weak.  She denies having any overt forms of blood loss to explain her anemia.    PHYSICAL EXAM:  Blood pressure 111/60, pulse (!) 105, temperature 98.3 F (36.8 C), resp. rate 16, height 5\' 5"  (1.651 m), weight 144 lb (65.3 kg), SpO2 95 %. Wt Readings from Last 3 Encounters:  09/23/20 144 lb (65.3 kg)  08/19/20 144 lb (65.3 kg)  07/17/20 151 lb 3.2 oz (68.6 kg)   Body mass index is 23.96 kg/m. Performance status (ECOG): 2 - Symptomatic, <50% confined to bed Physical Exam Constitutional:      Appearance: Normal appearance. She is ill-appearing (chronically ill appearance; ambulates with a cane).  HENT:     Mouth/Throat:     Mouth: Mucous membranes are moist.     Pharynx: Oropharynx is clear. No oropharyngeal exudate or posterior oropharyngeal erythema.  Cardiovascular:     Rate and Rhythm: Normal rate and regular rhythm.     Heart sounds: No murmur heard. No friction rub. No gallop.   Pulmonary:     Effort: Pulmonary effort is normal. No respiratory distress.     Breath sounds: Normal breath sounds. No wheezing, rhonchi or rales.  Chest:  Breasts:     Right: No axillary adenopathy or supraclavicular adenopathy.     Left: No axillary adenopathy or supraclavicular adenopathy.    Abdominal:     General: Bowel sounds are normal. There is no distension.     Palpations: Abdomen is soft. There is no mass.     Tenderness: There is  no abdominal tenderness.  Musculoskeletal:        General: No swelling.     Right lower leg: No edema.     Left lower leg: No edema.  Lymphadenopathy:     Cervical: No cervical adenopathy.     Upper Body:     Right upper body: No supraclavicular or axillary adenopathy.     Left upper body: No supraclavicular or axillary adenopathy.     Lower Body: No right inguinal adenopathy. No left inguinal adenopathy.  Skin:    General: Skin is warm.     Coloration: Skin is not jaundiced.     Findings: Bruising present. No lesion or rash.  Neurological:     General: No focal deficit present.     Mental Status: She is alert and oriented to person, place, and time. Mental status is at baseline.     Cranial Nerves: Cranial nerves are intact.  Psychiatric:        Mood and Affect: Mood normal.        Behavior: Behavior normal.        Thought Content: Thought content normal.     LABS:   CBC Latest Ref Rng & Units 09/23/2020 08/19/2020 07/17/2020  WBC - 15.0 9.1 8.4  Hemoglobin 12.0 - 16.0 9.3(A) 10.7(L) 11.2  Hematocrit 36 - 46 28(A) 32.3(L) 34.3  Platelets 150 - 399 365  372 349   CMP Latest Ref Rng & Units 09/23/2020 08/19/2020 07/17/2020  Glucose 65 - 99 mg/dL - 79 79  BUN 4 - 21 36(A) 25(H) 21  Creatinine 0.5 - 1.1 1.1 1.26(H) 1.33(H)  Sodium 137 - 147 125(A) 132(L) 137  Potassium 3.4 - 5.3 3.2(A) 4.1 4.1  Chloride 99 - 108 92(A) 95(L) 96  CO2 13 - 22 23(A) 28 30(H)  Calcium 8.7 - 10.7 6.8(A) 8.8 9.3  Total Protein 6.0 - 8.5 g/dL - 4.8(L) 5.0(L)  Total Bilirubin 0.0 - 1.2 mg/dL - 0.3 0.3  Alkaline Phos 25 - 125 174(A) 171(H) 163(H)  AST 13 - 35 38(A) 24 32  ALT 7 - 35 35 25 37(H)     Ref. Range 09/23/2020 09:43  Iron Latest Ref Range: 28 - 170 ug/dL 151  UIBC Latest Units: ug/dL NOT CALCULATED  TIBC Latest Ref Range: 250 - 450 ug/dL 145 (L)  Saturation Ratios Latest Ref Range: 10.4 - 31.8 % 104 (H)  Ferritin Latest Ref Range: 11 - 307 ng/mL 1,112 (H)  Folate Latest Ref Range: >5.9  ng/mL 48.5  Vitamin B12 Latest Ref Range: 180 - 914 pg/mL 3,080 (H)   ASSESSMENT & PLAN:  Assessment/Plan:  A 55 y.o. female with leukocytosis and anemia.  As it pertains to her elevated white count, her recent surgery and multiple comorbidities are likely behind this.  She exhibits no signs of infection or hematologic malignancy.  Her leukocytosis will continue to be followed conservatively.  As it pertains to her anemia, she has no nutritional deficiencies present.  Her anemia is likely related to her multiple medical problems including renal insufficiency.  I will arrange for her to start Retacrit injections at 40,000 units monthly with the goal being to get her hemoglobin above 10.  I will see her back in 3 month for repeat clinical assessment.  The patient understands all the plans discussed today and is in agreement with them.       Macarthur Critchley, MD

## 2020-09-23 ENCOUNTER — Other Ambulatory Visit: Payer: Self-pay | Admitting: Hematology and Oncology

## 2020-09-23 ENCOUNTER — Telehealth: Payer: Self-pay

## 2020-09-23 ENCOUNTER — Other Ambulatory Visit: Payer: Self-pay

## 2020-09-23 ENCOUNTER — Telehealth: Payer: Self-pay | Admitting: Oncology

## 2020-09-23 ENCOUNTER — Inpatient Hospital Stay: Payer: 59 | Attending: Oncology

## 2020-09-23 ENCOUNTER — Inpatient Hospital Stay (HOSPITAL_BASED_OUTPATIENT_CLINIC_OR_DEPARTMENT_OTHER): Payer: 59 | Admitting: Oncology

## 2020-09-23 ENCOUNTER — Other Ambulatory Visit: Payer: Self-pay | Admitting: Oncology

## 2020-09-23 VITALS — BP 111/60 | HR 105 | Temp 98.3°F | Resp 16 | Ht 65.0 in | Wt 144.0 lb

## 2020-09-23 DIAGNOSIS — D638 Anemia in other chronic diseases classified elsewhere: Secondary | ICD-10-CM

## 2020-09-23 DIAGNOSIS — D649 Anemia, unspecified: Secondary | ICD-10-CM | POA: Diagnosis present

## 2020-09-23 DIAGNOSIS — Z79899 Other long term (current) drug therapy: Secondary | ICD-10-CM | POA: Insufficient documentation

## 2020-09-23 DIAGNOSIS — D539 Nutritional anemia, unspecified: Secondary | ICD-10-CM

## 2020-09-23 DIAGNOSIS — N289 Disorder of kidney and ureter, unspecified: Secondary | ICD-10-CM

## 2020-09-23 DIAGNOSIS — D72829 Elevated white blood cell count, unspecified: Secondary | ICD-10-CM | POA: Diagnosis not present

## 2020-09-23 LAB — IRON AND TIBC
Iron: 151 ug/dL (ref 28–170)
Saturation Ratios: 104 % — ABNORMAL HIGH (ref 10.4–31.8)
TIBC: 145 ug/dL — ABNORMAL LOW (ref 250–450)

## 2020-09-23 LAB — BASIC METABOLIC PANEL
BUN: 36 — AB (ref 4–21)
CO2: 23 — AB (ref 13–22)
Chloride: 92 — AB (ref 99–108)
Creatinine: 1.1 (ref 0.5–1.1)
Glucose: 88
Potassium: 3.2 — AB (ref 3.4–5.3)
Sodium: 125 — AB (ref 137–147)

## 2020-09-23 LAB — CBC AND DIFFERENTIAL
HCT: 28 — AB (ref 36–46)
Hemoglobin: 9.3 — AB (ref 12.0–16.0)
Neutrophils Absolute: 12
Platelets: 365 (ref 150–399)
WBC: 15

## 2020-09-23 LAB — HEPATIC FUNCTION PANEL
ALT: 35 (ref 7–35)
AST: 38 — AB (ref 13–35)
Alkaline Phosphatase: 174 — AB (ref 25–125)
Bilirubin, Total: 1.2

## 2020-09-23 LAB — FERRITIN: Ferritin: 1112 ng/mL — ABNORMAL HIGH (ref 11–307)

## 2020-09-23 LAB — COMPREHENSIVE METABOLIC PANEL
Albumin: 2.6 — AB (ref 3.5–5.0)
Calcium: 6.8 — AB (ref 8.7–10.7)

## 2020-09-23 LAB — CBC: RBC: 2.87 — AB (ref 3.87–5.11)

## 2020-09-23 LAB — VITAMIN B12: Vitamin B-12: 3080 pg/mL — ABNORMAL HIGH (ref 180–914)

## 2020-09-23 LAB — FOLATE: Folate: 48.5 ng/mL (ref >5.9)

## 2020-09-23 NOTE — Telephone Encounter (Signed)
Per 3/7 LOS, patient scheduled for June Appt's.  Gave patient Appt Summary

## 2020-09-23 NOTE — Progress Notes (Signed)
Recent IP X 2 - low hgb, fall w/rib fx and surgery.

## 2020-09-23 NOTE — Telephone Encounter (Signed)
Pt called to get morning lab results. Dr Bobby Rumpf reviewed labs. He states, "She is not iron deficient. Her anemia is from kidney disease. I would like her to have Retacrit injections monthly".   I notified pt of above and that once insurance authorization is obtained, schedulers would call her for appt. Pt verbalized understanding.

## 2020-09-24 ENCOUNTER — Telehealth: Payer: Self-pay

## 2020-09-24 ENCOUNTER — Telehealth: Payer: Self-pay | Admitting: Oncology

## 2020-09-24 ENCOUNTER — Other Ambulatory Visit: Payer: Self-pay | Admitting: Pharmacist

## 2020-09-24 LAB — CBC W DIFFERENTIAL (~~LOC~~ CC SCANNED REPORT)

## 2020-09-24 LAB — COMPREHENSIVE METABOLIC PANEL (ASHBORO CC SCANNED REPORT)

## 2020-09-24 NOTE — Telephone Encounter (Signed)
Attempted to call pt again. No answer, left VM on pt VM box, identifying. Asked pt to schedule appointment to be seen.  Harrell Lark 09/24/20 4:42 PM

## 2020-09-24 NOTE — Telephone Encounter (Signed)
3/8/22Left vm to sched patient for injections

## 2020-09-24 NOTE — Telephone Encounter (Signed)
Patient scheduled for 3/9 Retacrit Injection

## 2020-09-24 NOTE — Telephone Encounter (Signed)
Home Health called stating pt is complaining of 8/10 back pain with urgency but urinary retention.   Called pt home number listed, no answer. Did not leave VM as tried to call husband as well. Left VM on husband's number for call back to clinic.   Harrell Lark 09/24/20 3:38 PM

## 2020-09-25 ENCOUNTER — Inpatient Hospital Stay: Payer: 59

## 2020-09-25 ENCOUNTER — Other Ambulatory Visit: Payer: Self-pay

## 2020-09-25 ENCOUNTER — Ambulatory Visit (INDEPENDENT_AMBULATORY_CARE_PROVIDER_SITE_OTHER): Payer: 59 | Admitting: Nurse Practitioner

## 2020-09-25 ENCOUNTER — Encounter: Payer: Self-pay | Admitting: Nurse Practitioner

## 2020-09-25 VITALS — BP 102/54 | HR 88 | Temp 98.1°F | Resp 18 | Ht 65.0 in | Wt 146.8 lb

## 2020-09-25 VITALS — BP 92/58 | HR 70 | Temp 97.1°F | Ht 65.0 in | Wt 141.0 lb

## 2020-09-25 DIAGNOSIS — D72829 Elevated white blood cell count, unspecified: Secondary | ICD-10-CM | POA: Diagnosis not present

## 2020-09-25 DIAGNOSIS — D649 Anemia, unspecified: Secondary | ICD-10-CM | POA: Diagnosis not present

## 2020-09-25 DIAGNOSIS — E871 Hypo-osmolality and hyponatremia: Secondary | ICD-10-CM

## 2020-09-25 DIAGNOSIS — R350 Frequency of micturition: Secondary | ICD-10-CM | POA: Diagnosis not present

## 2020-09-25 DIAGNOSIS — D638 Anemia in other chronic diseases classified elsewhere: Secondary | ICD-10-CM

## 2020-09-25 LAB — COMPREHENSIVE METABOLIC PANEL
ALT: 31 IU/L (ref 0–32)
AST: 27 IU/L (ref 0–40)
Albumin/Globulin Ratio: 1.4 (ref 1.2–2.2)
Albumin: 2.5 g/dL — ABNORMAL LOW (ref 3.8–4.9)
Alkaline Phosphatase: 172 IU/L — ABNORMAL HIGH (ref 44–121)
BUN/Creatinine Ratio: 31 — ABNORMAL HIGH (ref 9–23)
BUN: 37 mg/dL — ABNORMAL HIGH (ref 6–24)
Bilirubin Total: 0.9 mg/dL (ref 0.0–1.2)
CO2: 25 mmol/L (ref 20–29)
Calcium: 7.9 mg/dL — ABNORMAL LOW (ref 8.7–10.2)
Chloride: 98 mmol/L (ref 96–106)
Creatinine, Ser: 1.2 mg/dL — ABNORMAL HIGH (ref 0.57–1.00)
Globulin, Total: 1.8 g/dL (ref 1.5–4.5)
Glucose: 53 mg/dL — ABNORMAL LOW (ref 65–99)
Potassium: 5.6 mmol/L — ABNORMAL HIGH (ref 3.5–5.2)
Sodium: 129 mmol/L — ABNORMAL LOW (ref 134–144)
Total Protein: 4.3 g/dL — CL (ref 6.0–8.5)
eGFR: 53 mL/min/{1.73_m2} — ABNORMAL LOW (ref >59)

## 2020-09-25 LAB — POCT URINALYSIS DIPSTICK
Bilirubin, UA: NEGATIVE
Blood, UA: NEGATIVE
Glucose, UA: NEGATIVE
Ketones, UA: NEGATIVE
Leukocytes, UA: NEGATIVE
Nitrite, UA: NEGATIVE
Protein, UA: NEGATIVE
Spec Grav, UA: 1.02 (ref 1.010–1.025)
Urobilinogen, UA: NEGATIVE E.U./dL — AB
pH, UA: 6 (ref 5.0–8.0)

## 2020-09-25 LAB — CBC WITH DIFFERENTIAL/PLATELET
Basophils Absolute: 0.1 10*3/uL (ref 0.0–0.2)
Basos: 0 %
EOS (ABSOLUTE): 0.1 10*3/uL (ref 0.0–0.4)
Eos: 1 %
Hematocrit: 25 % — ABNORMAL LOW (ref 34.0–46.6)
Hemoglobin: 8.3 g/dL — ABNORMAL LOW (ref 11.1–15.9)
Lymphocytes Absolute: 2 10*3/uL (ref 0.7–3.1)
Lymphs: 14 %
MCH: 32.2 pg (ref 26.6–33.0)
MCHC: 33.2 g/dL (ref 31.5–35.7)
MCV: 97 fL (ref 79–97)
Monocytes Absolute: 1 10*3/uL — ABNORMAL HIGH (ref 0.1–0.9)
Monocytes: 7 %
Neutrophils Absolute: 11.1 10*3/uL — ABNORMAL HIGH (ref 1.4–7.0)
Neutrophils: 78 %
Platelets: 389 10*3/uL (ref 150–450)
RBC: 2.58 x10E6/uL — CL (ref 3.77–5.28)
RDW: 17.4 % — ABNORMAL HIGH (ref 11.7–15.4)
WBC: 14.2 10*3/uL — ABNORMAL HIGH (ref 3.4–10.8)

## 2020-09-25 MED ORDER — EPOETIN ALFA-EPBX 40000 UNIT/ML IJ SOLN
40000.0000 [IU] | Freq: Once | INTRAMUSCULAR | Status: AC
  Administered 2020-09-25: 40000 [IU] via SUBCUTANEOUS

## 2020-09-25 MED ORDER — EPOETIN ALFA-EPBX 40000 UNIT/ML IJ SOLN
INTRAMUSCULAR | Status: AC
  Filled 2020-09-25: qty 1

## 2020-09-25 MED ORDER — SODIUM CHLORIDE 1 G PO TABS: 1.0000 g | ORAL_TABLET | Freq: Two times a day (BID) | ORAL | 0 refills | Status: DC

## 2020-09-25 NOTE — Patient Instructions (Signed)

## 2020-09-25 NOTE — Progress Notes (Signed)
1532: PT STABLE AT TIME OF DISCHARGE  

## 2020-09-25 NOTE — Progress Notes (Signed)
Acute Office Visit  Subjective:    Patient ID: Molly Parrish, female    DOB: 1965-10-07, 55 y.o.   MRN: [id number]  CC: Bladder symptoms   HPI  Molly Parrish is a 55 year old Caucasian female is in today for urinary frequency, urgency, hesitancy, decreased urine output, and back pain. Denies dysuria. Onset of symptoms was 4-days ago. Treatment includes drinking water and cranberry juice, Tramadol, Ibuprofen, heating pad, and Lidocaine patches to lower back. She tells me she had lab work two days ago at the [facility].  Past Medical History:  Diagnosis Date  . Alcoholic cirrhosis of liver ([place])   . Anemia   . Bipolar disorder current episode depressed ([place])   . CKD (chronic kidney disease)   . Generalized anxiety disorder   . GERD (gastroesophageal reflux disease)   . Hypokalemia   . Intestinal malabsorption   . Korsakoff syndrome ([place])   . Other osteoporosis without current pathological fracture   . Other specified hypothyroidism   . Papillary thyroid carcinoma ([place]) 12/10/2015  . Portal hypertension ([place])   . Stroke [facility])     left basal ganglia stroke (hemorrhagic)  . Vitamin D deficiency     Past Surgical History:  Procedure Laterality Date  . CATARACT EXTRACTION    . GASTRIC BYPASS  2005  . HEMORRHOID SURGERY    . HERNIA REPAIR    . metatarsus abductus surgery Left 1990  . PARTIAL HYSTERECTOMY  05/1997   BL Ovaries remain. Done for fibroids.  . THYROIDECTOMY  03/2013    Family History  Problem Relation Age of Onset  . Osteoporosis Mother   . Cancer Mother        Breast, lung, skin.     Social History   Socioeconomic History  . Marital status: Married    Spouse name: Not on file  . Number of children: 1  . Years of education: Not on file  . Highest education level: Not on file  Occupational History  . Occupation: Engineer, maintenance (IT)    Comment: Retired.  Tobacco Use  . Smoking status: Never Smoker  . Smokeless tobacco: Never Used  Substance and Sexual  Activity  . Alcohol use: Not Currently    Comment: history of alcoholism. sober since 2014.  . Drug use: Never  . Sexual activity: Yes  Other Topics Concern  . Not on file  Social History Narrative   Right handed   Social Determinants of Health   Financial Resource Strain: Not on file  Food Insecurity: Not on file  Transportation Needs: Not on file  Physical Activity: Not on file  Stress: Not on file  Social Connections: Not on file  Intimate Partner Violence: Not on file    Outpatient Medications Prior to Visit  Medication Sig Dispense Refill  . ALPRAZolam (XANAX) 0.25 MG tablet Take 1 tablet (0.25 mg total) by mouth 2 (two) times daily as needed. 60 tablet 1  . calcitRIOL (ROCALTROL) 0.5 MCG capsule Take 4 capsules by mouth twice daily    . calcium carbonate (OSCAL) 1500 (600 Ca) MG TABS tablet Take 2,400 mg of elemental calcium by mouth 2 (two) times daily with a meal.    . cyanocobalamin 1000 MCG tablet Take 1,000 mcg by mouth at bedtime.    . famotidine (PEPCID) 40 MG tablet Take 40 mg by mouth at bedtime.    . furosemide (LASIX) 40 MG tablet TAKE 2 TABLETS BY MOUTH TWICE DAILY 360 tablet 1  . Iron-FA-B Cmp-C-Biot-Probiotic (FUSION PLUS)  CAPS Take by mouth 2 (two) times daily.     . Iron-Vitamins (GERITOL COMPLETE) TABS Take 1 tablet by mouth daily.    Marland Kitchen lamoTRIgine (LAMICTAL) 200 MG tablet Take 1 at night    . levothyroxine (SYNTHROID) 200 MCG tablet Patient takes 1 tablets M-F and 1/2 Sat and Sun    . magnesium oxide (MAG-OX) 400 MG tablet Take by mouth.    . midodrine (PROAMATINE) 2.5 MG tablet TAKE 1 TABLET(2.5 MG) BY MOUTH THREE TIMES DAILY WITH MEALS 90 tablet 0  . mirtazapine (REMERON) 15 MG tablet Take 1 tablet (15 mg total) by mouth at bedtime. 30 tablet 0  . nystatin (MYCOSTATIN) 100000 UNIT/ML suspension TAKE 5 ML BY MOUTH FOUR TIMES DAILY X 7 DAYS 473 mL 1  . omeprazole (PRILOSEC) 40 MG capsule Take 1 capsule by mouth at bedtime.    . potassium chloride SA  (KLOR-CON) 20 MEQ tablet TAKE 4 TABLETS BY MOUTH TWICE DAILY (Patient taking differently: Take 20 mEq by mouth daily.) 240 tablet 2  . promethazine (PHENERGAN) 25 MG tablet TAKE ONE TABLET BY MOUTH 4 TIMES DAILY AS NEEDED FOR NAUSEA OR VOMITING 40 tablet 2  . propranolol (INDERAL) 10 MG tablet Take 10 mg by mouth at bedtime.    Marland Kitchen QUEtiapine (SEROQUEL) 50 MG tablet Take  2 at night    . rifaximin (XIFAXAN) 550 MG TABS tablet Take 550 mg by mouth 2 (two) times daily.    . sertraline (ZOLOFT) 100 MG tablet Take 1 tab twice a day    . spironolactone (ALDACTONE) 50 MG tablet Take 1 tablet (50 mg total) by mouth 2 (two) times daily. 60 tablet 0  . temazepam (RESTORIL) 30 MG capsule Take 30 mg by mouth at bedtime as needed.    . thiamine 100 MG tablet Take by mouth.    . topiramate (TOPAMAX) 50 MG tablet Take 50 mg by mouth at bedtime.    . traMADol (ULTRAM) 50 MG tablet TAKE 2 TABLETS BY MOUTH TWICE DAILY FOR BACK PAIN 120 tablet 0   No facility-administered medications prior to visit.    Allergies  Allergen Reactions  . Cholestyramine     Rash and itching  . Trintellix [Vortioxetine]     Review of Systems  Respiratory: Positive for shortness of breath (with activity).   Genitourinary: Positive for decreased urine volume, frequency and urgency. Negative for dysuria, flank pain and hematuria.       Urinary hesitancy and incomplete emptying sensation  Musculoskeletal: Positive for arthralgias, back pain (lower back pain) and gait problem (ambulates with walker).  All other systems reviewed and are negative.      Objective:    Physical Exam Vitals reviewed.  Constitutional:      Appearance: Normal appearance.  HENT:     Head: Normocephalic.  Cardiovascular:     Rate and Rhythm: Normal rate and regular rhythm.     Pulses: Normal pulses.     Heart sounds: Normal heart sounds.  Abdominal:     General: Bowel sounds are normal.     Palpations: Abdomen is soft.     Tenderness: There is  no abdominal tenderness. There is no right CVA tenderness or left CVA tenderness.  Skin:    Capillary Refill: Capillary refill takes less than 2 seconds.  Neurological:     General: No focal deficit present.     Mental Status: She is alert and oriented to person, place, and time.  Psychiatric:  Mood and Affect: Mood normal.        Behavior: Behavior normal.     BP (!) 92/58 (BP Location: Left Arm, Patient Position: Sitting)   Pulse 70   Temp (!) 97.1 F (36.2 C) (Temporal)   Ht 5\' 5"  (1.651 m)   Wt 141 lb (64 kg)   SpO2 100%   BMI 23.46 kg/m  Wt Readings from Last 3 Encounters:  09/23/20 144 lb (65.3 kg)  08/19/20 144 lb (65.3 kg)  07/17/20 151 lb 3.2 oz (68.6 kg)    Health Maintenance Due  Topic Date Due  . Hepatitis C Screening  Never done  . HIV Screening  Never done  . PAP SMEAR-Modifier  Never done  . MAMMOGRAM  Never done  . COVID-19 Vaccine (4 - Booster for Pfizer series) 09/08/2020      Lab Results  Component Value Date   TSH 2.050 04/16/2020   Lab Results  Component Value Date   WBC 15.0 09/23/2020   HGB 9.3 (A) 09/23/2020   HCT 28 (A) 09/23/2020   MCV 92 08/19/2020   PLT 365 09/23/2020   Lab Results  Component Value Date   NA 125 (A) 09/23/2020   K 3.2 (A) 09/23/2020   CO2 23 (A) 09/23/2020   GLUCOSE 79 08/19/2020   BUN 36 (A) 09/23/2020   CREATININE 1.1 09/23/2020   BILITOT 0.3 08/19/2020   ALKPHOS 174 (A) 09/23/2020   AST 38 (A) 09/23/2020   ALT 35 09/23/2020   PROT 4.8 (L) 08/19/2020   ALBUMIN 2.6 (A) 09/23/2020   CALCIUM 6.8 (A) 09/23/2020   Lab Results  Component Value Date   CHOL 137 05/08/2020   Lab Results  Component Value Date   HDL 93 05/08/2020   Lab Results  Component Value Date   LDLCALC 32 05/08/2020   Lab Results  Component Value Date   TRIG 55 05/08/2020   Lab Results  Component Value Date   CHOLHDL 1.5 05/08/2020        Assessment & Plan:   1. Hyponatremia - Comprehensive metabolic  panel-stat -Salt tablets 1 gm twice daily -Decrease fluid intake  2. Urinary frequency - POCT urinalysis dipstick - Ambulatory referral to Urology  3. Anemia, unspecified type - CBC with Differential/Platelet-stat    Decrease fluid intake We will call you with lab results Continue prescribed medications Salt tablets 1 gm twice daily   Follow-up: As needed Signed, Rip Harbour, NP

## 2020-09-25 NOTE — Patient Instructions (Addendum)
Decrease fluid intake We will call you with lab results Continue prescribed medications Salt tablets 1 gm twice daily   [employer] medicine (25th ed., pp. 6295-2841). [place], [place]: Elsevier.">  Anemia  Anemia is a condition in which there is not enough red blood cells or hemoglobin in the blood. Hemoglobin is a substance in red blood cells that carries oxygen. When you do not have enough red blood cells or hemoglobin (are anemic), your body cannot get enough oxygen and your organs may not work properly. As a result, you may feel very tired or have other problems. What are the causes? Common causes of anemia include:  Excessive bleeding. Anemia can be caused by excessive bleeding inside or outside the body, including bleeding from the intestines or from heavy menstrual periods in females.  Poor nutrition.  Long-lasting (chronic) kidney, thyroid, and liver disease.  Bone marrow disorders, spleen problems, and blood disorders.  Cancer and treatments for cancer.  HIV (human immunodeficiency virus) and AIDS (acquired immunodeficiency syndrome).  Infections, medicines, and autoimmune disorders that destroy red blood cells. What are the signs or symptoms? Symptoms of this condition include:  Minor weakness.  Dizziness.  Headache, or difficulties concentrating and sleeping.  Heartbeats that feel irregular or faster than normal (palpitations).  Shortness of breath, especially with exercise.  Pale skin, lips, and nails, or cold hands and feet.  Indigestion and nausea. Symptoms may occur suddenly or develop slowly. If your anemia is mild, you may not have symptoms. How is this diagnosed? This condition is diagnosed based on blood tests, your medical history, and a physical exam. In some cases, a test may be needed in which cells are removed from the soft tissue inside of a bone and looked at under a microscope (bone marrow biopsy). Your health care provider may also check  your stool (feces) for blood and may do additional testing to look for the cause of your bleeding. Other tests may include:  Imaging tests, such as a CT scan or MRI.  A procedure to see inside your esophagus and stomach (endoscopy).  A procedure to see inside your colon and rectum (colonoscopy). How is this treated? Treatment for this condition depends on the cause. If you continue to lose a lot of blood, you may need to be treated at a hospital. Treatment may include:  Taking supplements of iron, vitamin B12, or folic acid.  Taking a hormone medicine (erythropoietin) that can help to stimulate red blood cell growth.  Having a blood transfusion. This may be needed if you lose a lot of blood.  Making changes to your diet.  Having surgery to remove your spleen. Follow these instructions at home:  Take over-the-counter and prescription medicines only as told by your health care provider.  Take supplements only as told by your health care provider.  Follow any diet instructions that you were given by your health care provider.  Keep all follow-up visits as told by your health care provider. This is important. Contact a health care provider if:  You develop new bleeding anywhere in the body. Get help right away if:  You are very weak.  You are short of breath.  You have pain in your abdomen or chest.  You are dizzy or feel faint.  You have trouble concentrating.  You have bloody stools, black stools, or tarry stools.  You vomit repeatedly or you vomit up blood. These symptoms may represent a serious problem that is an emergency. Do not wait to see if  the symptoms will go away. Get medical help right away. Call your local emergency services (911 in the [place] Do not drive yourself to the hospital. Summary  Anemia is a condition in which you do not have enough red blood cells or enough of a substance in your red blood cells that carries oxygen (hemoglobin).  Symptoms may  occur suddenly or develop slowly.  If your anemia is mild, you may not have symptoms.  This condition is diagnosed with blood tests, a medical history, and a physical exam. Other tests may be needed.  Treatment for this condition depends on the cause of the anemia. This information is not intended to replace advice given to you by your health care provider. Make sure you discuss any questions you have with your health care provider. Document Revised: 06/13/2019 Document Reviewed: 06/13/2019 Elsevier Patient Education  2021 Elsevier Inc. Hyponatremia Hyponatremia is when the amount of salt (sodium) in your blood is too low. When salt levels are low, your body may take in extra water. This can cause swelling throughout the body. The swelling often affects the brain. What are the causes? This condition may be caused by:  Certain medical problems or conditions.  Vomiting a lot.  Having watery poop (diarrhea) often.  Certain medicines or illegal drugs.  Not having enough water in the body (dehydration).  Drinking too much water.  Eating a diet that is low in salt.  Large burns on your body.  Too much sweating. What increases the risk? You are more likely to get this condition if you:  Have long-term (chronic) kidney disease.  Have heart failure.  Have a medical condition that causes you to have watery poop often.  Do very hard exercises.  Take medicines that affect the amount of salt is in your blood. What are the signs or symptoms? Symptoms of this condition include:  Headache.  Feeling like you may vomit (nausea).  Vomiting.  Being very tired (lethargic).  Muscle weakness and cramps.  Not wanting to eat as much as normal (loss of appetite).  Feeling weak or light-headed. Severe symptoms of this condition include:  Confusion.  Feeling restless (agitation).  Having a fast heart rate.  Passing out (fainting).  Seizures.  Coma. How is this  treated? Treatment for this condition depends on the cause. Treatment may include:  Getting fluids through an IV tube that is put into one of your veins.  Taking medicines to fix the salt levels in your blood. If medicines are causing the problem, your medicines will need to be changed.  Limiting how much water or fluid you take in.  Monitoring in the hospital to watch your symptoms.   Follow these instructions at home:  Take over-the-counter and prescription medicines only as told by your doctor. Many medicines can make this condition worse. Talk with your doctor about any medicines that you are taking.  Eat and drink exactly as you are told by your doctor. ? Eat only the foods you are told to eat. ? Limit how much fluid you take.  Do not drink alcohol.  Keep all follow-up visits as told by your doctor. This is important.   Contact a doctor if:  You feel more like you may vomit.  You feel more tired.  Your headache gets worse.  You feel more confused.  You feel weaker.  Your symptoms go away and then they come back.  You have trouble following the diet instructions. Get help right away if:  You have a seizure.  You pass out.  You keep having watery poop.  You keep vomiting. Summary  Hyponatremia is when the amount of salt in your blood is too low.  When salt levels are low, you can have swelling throughout the body. The swelling mostly affects the brain.  Treatment depends on the cause. Treatment may include getting IV fluids, medicines, or not drinking as much fluid. This information is not intended to replace advice given to you by your health care provider. Make sure you discuss any questions you have with your health care provider. Document Revised: 09/22/2018 Document Reviewed: 06/09/2018 Elsevier Patient Education  2021 [employer].

## 2020-10-01 NOTE — Progress Notes (Signed)
Subjective:  Patient ID: Molly Parrish, female    DOB: 1966-01-19  Age: 55 y.o. MRN: [id number]  Chief Complaint  Patient presents with  . frequent falls    HPI Patient states she fell yesterday while trying to grab some silverware. No injuries. Says she has felt very weak for the past week. She feels she has to hold something in order to walk if she does not have her walker near by.  Hyponatremia: on salt tablets. Not eating correctly because has sores/burning tongue. Drinks water. Drinks sprite although it does hurt.)  Protein shakes twice this week. Did not cause tongue to hurt. Nystatin and magic mouthwash. It improves, but does not resolve. Never has gone fully away.  Listerine mouth sores mouthwash: hurts, but does it anyway hoping it will resolve. .   Current Outpatient Medications on File Prior to Visit  Medication Sig Dispense Refill  . ALPRAZolam (XANAX) 0.25 MG tablet Take 1 tablet (0.25 mg total) by mouth 2 (two) times daily as needed. 60 tablet 1  . calcitRIOL (ROCALTROL) 0.5 MCG capsule Take 4 capsules by mouth twice daily    . calcium carbonate (OSCAL) 1500 (600 Ca) MG TABS tablet Take 2,400 mg of elemental calcium by mouth 2 (two) times daily with a meal.    . cyanocobalamin 1000 MCG tablet Take 1,000 mcg by mouth at bedtime.    . famotidine (PEPCID) 40 MG tablet Take 40 mg by mouth at bedtime.    . furosemide (LASIX) 40 MG tablet TAKE 2 TABLETS BY MOUTH TWICE DAILY 360 tablet 1  . Iron-FA-B Cmp-C-Biot-Probiotic (FUSION PLUS) CAPS Take by mouth 2 (two) times daily.     . Iron-Vitamins (GERITOL COMPLETE) TABS Take 1 tablet by mouth daily.    Marland Kitchen lamoTRIgine (LAMICTAL) 200 MG tablet Take 1 at night    . levothyroxine (SYNTHROID) 200 MCG tablet Patient takes 1 tablets M-F and 1/2 Sat and Sun    . magnesium oxide (MAG-OX) 400 MG tablet Take by mouth.    . midodrine (PROAMATINE) 2.5 MG tablet TAKE 1 TABLET(2.5 MG) BY MOUTH THREE TIMES DAILY WITH MEALS 90 tablet 0  .  mirtazapine (REMERON) 15 MG tablet Take 1 tablet (15 mg total) by mouth at bedtime. 30 tablet 0  . omeprazole (PRILOSEC) 40 MG capsule Take 1 capsule by mouth at bedtime.    . potassium chloride SA (KLOR-CON) 20 MEQ tablet TAKE 4 TABLETS BY MOUTH TWICE DAILY (Patient taking differently: Take 20 mEq by mouth daily.) 240 tablet 2  . promethazine (PHENERGAN) 25 MG tablet TAKE ONE TABLET BY MOUTH 4 TIMES DAILY AS NEEDED FOR NAUSEA OR VOMITING 40 tablet 2  . propranolol (INDERAL) 10 MG tablet Take 10 mg by mouth at bedtime.    Marland Kitchen QUEtiapine (SEROQUEL) 50 MG tablet Take  2 at night    . rifaximin (XIFAXAN) 550 MG TABS tablet Take 550 mg by mouth 2 (two) times daily.    . sertraline (ZOLOFT) 100 MG tablet Take 1 tab twice a day    . sodium chloride 1 g tablet Take 1 tablet (1 g total) by mouth 2 (two) times daily with a meal. 60 tablet 0  . spironolactone (ALDACTONE) 50 MG tablet Take 1 tablet (50 mg total) by mouth 2 (two) times daily. 60 tablet 0  . temazepam (RESTORIL) 30 MG capsule Take 30 mg by mouth at bedtime as needed.    . thiamine 100 MG tablet Take by mouth.    . topiramate (  TOPAMAX) 50 MG tablet Take 50 mg by mouth at bedtime.    . traMADol (ULTRAM) 50 MG tablet TAKE 2 TABLETS BY MOUTH TWICE DAILY FOR BACK PAIN 120 tablet 0   No current facility-administered medications on file prior to visit.   Past Medical History:  Diagnosis Date  . Alcoholic cirrhosis of liver ([place])   . Anemia   . Bipolar disorder current episode depressed ([place])   . CKD (chronic kidney disease)   . Generalized anxiety disorder   . GERD (gastroesophageal reflux disease)   . Hypokalemia   . Intestinal malabsorption   . Korsakoff syndrome ([place])   . Other osteoporosis without current pathological fracture   . Other specified hypothyroidism   . Papillary thyroid carcinoma ([place]) 12/10/2015  . Portal hypertension ([place])   . Stroke [facility])     left basal ganglia stroke (hemorrhagic)  . Vitamin D deficiency    Past  Surgical History:  Procedure Laterality Date  . CATARACT EXTRACTION    . GASTRIC BYPASS  2005  . HEMORRHOID SURGERY    . HERNIA REPAIR    . metatarsus abductus surgery Left 1990  . PARTIAL HYSTERECTOMY  05/1997   BL Ovaries remain. Done for fibroids.  . THYROIDECTOMY  03/2013    Family History  Problem Relation Age of Onset  . Osteoporosis Mother   . Cancer Mother        Breast, lung, skin.    Social History   Socioeconomic History  . Marital status: Married    Spouse name: Not on file  . Number of children: 1  . Years of education: Not on file  . Highest education level: Not on file  Occupational History  . Occupation: Engineer, maintenance (IT)    Comment: Retired.  Tobacco Use  . Smoking status: Never Smoker  . Smokeless tobacco: Never Used  Substance and Sexual Activity  . Alcohol use: Not Currently    Comment: history of alcoholism. sober since 2014.  . Drug use: Never  . Sexual activity: Yes  Other Topics Concern  . Not on file  Social History Narrative   Right handed   Social Determinants of Health   Financial Resource Strain: Not on file  Food Insecurity: Not on file  Transportation Needs: Not on file  Physical Activity: Not on file  Stress: Not on file  Social Connections: Not on file    Review of Systems  Constitutional: Positive for fatigue. Negative for chills and fever.  HENT: Negative for congestion, ear pain, rhinorrhea and sore throat.   Respiratory: Positive for shortness of breath (with walking. ). Negative for cough.   Cardiovascular: Negative for chest pain.  Gastrointestinal: Positive for constipation (last BM was on Monday. Restarted lactulose 60 mg twice a day. ). Negative for abdominal pain, diarrhea, nausea and vomiting.  Genitourinary: Negative for dysuria and urgency.  Musculoskeletal: Negative for arthralgias, back pain (lower back pain) and myalgias.  Neurological: Positive for dizziness. Negative for weakness, light-headedness and headaches.   Psychiatric/Behavioral: Positive for dysphoric mood. The patient is nervous/anxious.      Objective:  BP 102/64   Pulse 88   Temp (!) 97.3 F (36.3 C)   Resp 18   Ht 5\' 5"  (1.651 m)   Wt 145 lb (65.8 kg)   BMI 24.13 kg/m   BP/Weight 10/03/2020 09/24/9022 0/03/7352  Systolic BP 299 92 242  Diastolic BP 64 58 54  Wt. (Lbs) 145 141 146.75  BMI 24.13 23.46 24.42    Physical Exam  Vitals reviewed.  Constitutional:      Appearance: Normal appearance. She is normal weight.  Neck:     Vascular: No carotid bruit.  Cardiovascular:     Rate and Rhythm: Normal rate and regular rhythm.     Pulses: Normal pulses.     Heart sounds: Normal heart sounds.  Pulmonary:     Effort: Pulmonary effort is normal. No respiratory distress.     Breath sounds: Normal breath sounds.  Abdominal:     General: Abdomen is flat. Bowel sounds are normal.     Palpations: Abdomen is soft.     Tenderness: There is no abdominal tenderness.  Musculoskeletal:     Right foot: Deformity and bunion present.     Left foot: Deformity and bunion present.  Feet:     Right foot:     Skin integrity: Ulcer and callus present.     Left foot:     Skin integrity: Callus present. No ulcer.     Comments: Ulceration with tenderness/fluctuant. Neurological:     Mental Status: She is alert and oriented to person, place, and time.  Psychiatric:        Mood and Affect: Mood normal.        Behavior: Behavior normal.     Diabetic Foot Exam - Simple   No data filed      Lab Results  Component Value Date   WBC 15.0 (H) 10/03/2020   HGB 8.8 (L) 10/03/2020   HCT 26.7 (L) 10/03/2020   PLT 349 10/03/2020   GLUCOSE CANCELED 10/03/2020   CHOL 137 05/08/2020   TRIG 55 05/08/2020   HDL 93 05/08/2020   LDLCALC 32 05/08/2020   ALT 26 10/03/2020   AST 24 10/03/2020   NA 130 (L) 10/03/2020   K CANCELED 10/03/2020   CL 93 (L) 10/03/2020   CREATININE 1.28 (H) 10/03/2020   BUN 28 (H) 10/03/2020   CO2 17 (L) 10/03/2020    TSH 2.050 04/16/2020      Assessment & Plan:   1. Falls frequently Unclear etiology, although likely multifactorial (anemia, hyponatremia, ataxia)  2. Hyponatremia - Comprehensive metabolic panel  3. Other specified nutritional anemias - CBC with Differential/Platelet  4. Other constipation Continue lactulose.   5. Tongue ulcer - Ambulatory referral to ENT  6. Acquired ankle/foot deformity, right and ulceration of side of right foot.  Patient to see podiatry in am.    7. Acquired ankle/foot deformity, left Patient to see podiatry in am.   Meds ordered this encounter  Medications  . nystatin (MYCOSTATIN) 100000 UNIT/ML suspension    Sig: TAKE 5 ML BY MOUTH FOUR TIMES DAILY X 7 DAYS    Dispense:  473 mL    Refill:  1  . magic mouthwash (lidocaine, diphenhydrAMINE, alum & mag hydroxide) suspension    Sig: Swish and spit 5 mLs 4 (four) times daily.    Dispense:  360 mL    Refill:  0    Orders Placed This Encounter  Procedures  . CBC with Differential/Platelet  . Comprehensive metabolic panel  . Ambulatory referral to ENT    Follow-up: Return in about 4 weeks (around 10/31/2020).  An After Visit Summary was printed and given to the patient.  Rochel Brome, MD [facility] [phone]

## 2020-10-03 ENCOUNTER — Other Ambulatory Visit: Payer: Self-pay

## 2020-10-03 ENCOUNTER — Ambulatory Visit (INDEPENDENT_AMBULATORY_CARE_PROVIDER_SITE_OTHER): Payer: 59 | Admitting: Family Medicine

## 2020-10-03 VITALS — BP 102/64 | HR 88 | Temp 97.3°F | Resp 18 | Ht 65.0 in | Wt 145.0 lb

## 2020-10-03 DIAGNOSIS — K140 Glossitis: Secondary | ICD-10-CM

## 2020-10-03 DIAGNOSIS — D538 Other specified nutritional anemias: Secondary | ICD-10-CM

## 2020-10-03 DIAGNOSIS — E871 Hypo-osmolality and hyponatremia: Secondary | ICD-10-CM

## 2020-10-03 DIAGNOSIS — K5909 Other constipation: Secondary | ICD-10-CM

## 2020-10-03 DIAGNOSIS — R296 Repeated falls: Secondary | ICD-10-CM

## 2020-10-03 DIAGNOSIS — M21962 Unspecified acquired deformity of left lower leg: Secondary | ICD-10-CM

## 2020-10-03 DIAGNOSIS — M21961 Unspecified acquired deformity of right lower leg: Secondary | ICD-10-CM

## 2020-10-03 MED ORDER — NYSTATIN 100000 UNIT/ML MT SUSP: OROMUCOSAL | 1 refills | Status: DC

## 2020-10-03 MED ORDER — LIDOCAINE VISCOUS HCL 2 % MT SOLN: 5.0000 mL | Freq: Four times a day (QID) | OROMUCOSAL | 0 refills | Status: DC

## 2020-10-04 DIAGNOSIS — M79671 Pain in right foot: Secondary | ICD-10-CM | POA: Insufficient documentation

## 2020-10-04 DIAGNOSIS — L97522 Non-pressure chronic ulcer of other part of left foot with fat layer exposed: Secondary | ICD-10-CM | POA: Insufficient documentation

## 2020-10-04 DIAGNOSIS — M21961 Unspecified acquired deformity of right lower leg: Secondary | ICD-10-CM | POA: Insufficient documentation

## 2020-10-04 HISTORY — DX: Non-pressure chronic ulcer of other part of left foot with fat layer exposed: L97.522

## 2020-10-04 LAB — COMPREHENSIVE METABOLIC PANEL
ALT: 26 IU/L (ref 0–32)
AST: 24 IU/L (ref 0–40)
Albumin/Globulin Ratio: 0.9 — ABNORMAL LOW (ref 1.2–2.2)
Albumin: 2.4 g/dL — ABNORMAL LOW (ref 3.8–4.9)
Alkaline Phosphatase: 164 IU/L — ABNORMAL HIGH (ref 44–121)
BUN/Creatinine Ratio: 22 (ref 9–23)
BUN: 28 mg/dL — ABNORMAL HIGH (ref 6–24)
Bilirubin Total: 0.7 mg/dL (ref 0.0–1.2)
CO2: 17 mmol/L — ABNORMAL LOW (ref 20–29)
Calcium: 8.1 mg/dL — ABNORMAL LOW (ref 8.7–10.2)
Chloride: 93 mmol/L — ABNORMAL LOW (ref 96–106)
Creatinine, Ser: 1.28 mg/dL — ABNORMAL HIGH (ref 0.57–1.00)
Globulin, Total: 2.7 g/dL (ref 1.5–4.5)
Sodium: 130 mmol/L — ABNORMAL LOW (ref 134–144)
Total Protein: 5.1 g/dL — ABNORMAL LOW (ref 6.0–8.5)
eGFR: 49 mL/min/{1.73_m2} — ABNORMAL LOW (ref >59)

## 2020-10-04 LAB — CBC WITH DIFFERENTIAL/PLATELET
Basophils Absolute: 0.1 10*3/uL (ref 0.0–0.2)
Basos: 1 %
EOS (ABSOLUTE): 0 10*3/uL (ref 0.0–0.4)
Eos: 0 %
Hematocrit: 26.7 % — ABNORMAL LOW (ref 34.0–46.6)
Hemoglobin: 8.8 g/dL — ABNORMAL LOW (ref 11.1–15.9)
Immature Grans (Abs): 0.1 10*3/uL (ref 0.0–0.1)
Immature Granulocytes: 1 %
Lymphocytes Absolute: 1.7 10*3/uL (ref 0.7–3.1)
Lymphs: 11 %
MCH: 37 pg — ABNORMAL HIGH (ref 26.6–33.0)
MCHC: 33 g/dL (ref 31.5–35.7)
MCV: 112 fL — ABNORMAL HIGH (ref 79–97)
Monocytes Absolute: 1 10*3/uL — ABNORMAL HIGH (ref 0.1–0.9)
Monocytes: 7 %
Neutrophils Absolute: 12 10*3/uL — ABNORMAL HIGH (ref 1.4–7.0)
Neutrophils: 80 %
Platelets: 349 10*3/uL (ref 150–450)
RBC: 2.38 x10E6/uL — CL (ref 3.77–5.28)
RDW: 17.5 % — ABNORMAL HIGH (ref 11.7–15.4)
WBC: 15 10*3/uL — ABNORMAL HIGH (ref 3.4–10.8)

## 2020-10-06 ENCOUNTER — Encounter: Payer: Self-pay | Admitting: Family Medicine

## 2020-10-07 ENCOUNTER — Other Ambulatory Visit: Payer: Self-pay | Admitting: Family Medicine

## 2020-10-09 ENCOUNTER — Other Ambulatory Visit: Payer: Self-pay | Admitting: Legal Medicine

## 2020-10-10 DIAGNOSIS — L03115 Cellulitis of right lower limb: Secondary | ICD-10-CM | POA: Insufficient documentation

## 2020-10-10 DIAGNOSIS — L97512 Non-pressure chronic ulcer of other part of right foot with fat layer exposed: Secondary | ICD-10-CM

## 2020-10-10 HISTORY — DX: Non-pressure chronic ulcer of other part of right foot with fat layer exposed: L97.512

## 2020-10-11 ENCOUNTER — Other Ambulatory Visit: Payer: Self-pay

## 2020-10-11 DIAGNOSIS — K140 Glossitis: Secondary | ICD-10-CM

## 2020-10-11 MED ORDER — LIDOCAINE VISCOUS HCL 2 % MT SOLN: 5.0000 mL | Freq: Four times a day (QID) | OROMUCOSAL | 0 refills | Status: DC

## 2020-10-14 ENCOUNTER — Other Ambulatory Visit: Payer: Self-pay | Admitting: Family Medicine

## 2020-10-14 ENCOUNTER — Other Ambulatory Visit: Payer: Self-pay | Admitting: Physician Assistant

## 2020-10-21 ENCOUNTER — Other Ambulatory Visit: Payer: Self-pay | Admitting: Family Medicine

## 2020-10-22 ENCOUNTER — Telehealth: Payer: Self-pay | Admitting: Neurology

## 2020-10-22 ENCOUNTER — Other Ambulatory Visit: Payer: Self-pay | Admitting: Family Medicine

## 2020-10-22 NOTE — Telephone Encounter (Signed)
Patient number given to [place] [facility] [phone]

## 2020-10-22 NOTE — Telephone Encounter (Signed)
Patient was not able to have her scan that [facility] wanted and she needs to know where to call and what test she needs to have done

## 2020-10-29 ENCOUNTER — Ambulatory Visit: Payer: Medicare Other | Admitting: Neurology

## 2020-10-30 ENCOUNTER — Other Ambulatory Visit: Payer: Self-pay | Admitting: Family Medicine

## 2020-10-31 ENCOUNTER — Telehealth: Payer: Self-pay

## 2020-10-31 NOTE — Telephone Encounter (Signed)
Elevate legs until seen lp

## 2020-10-31 NOTE — Telephone Encounter (Signed)
Pt calling with "severe swelling" States she saw podiatry yesterday. States she is having calf and feet swelling bilaterally. They are itchy, painful, and tender. Rates pain 8/10. She states she never walks barefoot due to podiatry stating "skin over bone in feet" but she cannot get her feet even into her Velcro shoes. Pt denies chest pain/tightness, hand swelling, weeping, or abnormal heart (pulse, BP, heart racing). BP this morning was 105/69 with pulse 90, pt states this is her normal. Pt does not have ride to come today but can come early morning.   Please advise.   Royce Macadamia, [place] 10/31/20 11:41 AM

## 2020-11-01 ENCOUNTER — Other Ambulatory Visit: Payer: Self-pay

## 2020-11-01 ENCOUNTER — Ambulatory Visit (INDEPENDENT_AMBULATORY_CARE_PROVIDER_SITE_OTHER): Payer: 59 | Admitting: Physician Assistant

## 2020-11-01 ENCOUNTER — Encounter: Payer: Self-pay | Admitting: Physician Assistant

## 2020-11-01 ENCOUNTER — Ambulatory Visit: Payer: Medicare Other | Admitting: Physician Assistant

## 2020-11-01 VITALS — BP 110/70 | HR 90 | Temp 97.1°F | Ht 65.0 in | Wt 160.0 lb

## 2020-11-01 DIAGNOSIS — R609 Edema, unspecified: Secondary | ICD-10-CM

## 2020-11-01 DIAGNOSIS — R0602 Shortness of breath: Secondary | ICD-10-CM | POA: Insufficient documentation

## 2020-11-01 DIAGNOSIS — R600 Localized edema: Secondary | ICD-10-CM

## 2020-11-01 HISTORY — DX: Edema, unspecified: R60.9

## 2020-11-01 HISTORY — DX: Shortness of breath: R06.02

## 2020-11-01 HISTORY — DX: Localized edema: R60.0

## 2020-11-01 NOTE — Progress Notes (Signed)
Subjective:  Patient ID: Molly Parrish, female    DOB: 05/07/1966  Age: 55 y.o. MRN: [id number]  Chief Complaint  Patient presents with  . Foot Swelling    BL swelling in legs and feet x 2 weeks. In the morning her swelling is down but by mid afternoon her feet are so swollen she can not velcro her shoes. Her legs sometimes are a light red, have some heat to them, itch and tender.    HPI  pt complains of bilateral leg/ankle swelling - she states this has been worse in the past 1-2 weeks  She states that the swelling is minimal until about 3 pm then states it gets worse She denies chest pain or shortness of breath She is currently on spironolactone and lasix States she has not changed her diet or any new medications Current Outpatient Medications on File Prior to Visit  Medication Sig Dispense Refill  . ALPRAZolam (XANAX) 0.25 MG tablet TAKE 1 TABLET(0.25 MG) BY MOUTH TWICE DAILY AS NEEDED 60 tablet 1  . calcitRIOL (ROCALTROL) 0.5 MCG capsule Take 4 capsules by mouth twice daily    . calcium carbonate (OSCAL) 1500 (600 Ca) MG TABS tablet Take 2,400 mg of elemental calcium by mouth 2 (two) times daily with a meal.    . cyanocobalamin 1000 MCG tablet Take 1,000 mcg by mouth at bedtime.    . famotidine (PEPCID) 40 MG tablet Take 40 mg by mouth at bedtime.    . furosemide (LASIX) 40 MG tablet TAKE 2 TABLETS BY MOUTH TWICE DAILY 360 tablet 1  . Iron-FA-B Cmp-C-Biot-Probiotic (FUSION PLUS) CAPS Take by mouth 2 (two) times daily.     . Iron-Vitamins (GERITOL COMPLETE) TABS Take 1 tablet by mouth daily.    Marland Kitchen lamoTRIgine (LAMICTAL) 200 MG tablet Take 1 at night    . levothyroxine (SYNTHROID) 200 MCG tablet Patient takes 1 tablets M-F and 1/2 Sat and Sun    . magic mouthwash (lidocaine, diphenhydrAMINE, alum & mag hydroxide) suspension Swish and spit 5 mLs 4 (four) times daily. 360 mL 0  . magnesium oxide (MAG-OX) 400 MG tablet Take by mouth.    . midodrine (PROAMATINE) 2.5 MG tablet TAKE 1  TABLET(2.5 MG) BY MOUTH THREE TIMES DAILY WITH MEALS 90 tablet 0  . mirtazapine (REMERON) 15 MG tablet TAKE 1 TABLET(15 MG) BY MOUTH AT BEDTIME 30 tablet 0  . nystatin (MYCOSTATIN) 100000 UNIT/ML suspension TAKE 5 ML BY MOUTH FOUR TIMES DAILY X 7 DAYS 473 mL 1  . omeprazole (PRILOSEC) 40 MG capsule Take 1 capsule by mouth at bedtime.    . potassium chloride SA (KLOR-CON) 20 MEQ tablet TAKE 4 TABLETS BY MOUTH TWICE DAILY 240 tablet 2  . promethazine (PHENERGAN) 25 MG tablet TAKE 1 TABLET BY MOUTH FOUR TIMES DAILY AS NEEDED FOR NAUSEA OR VOMITING 40 tablet 2  . propranolol (INDERAL) 10 MG tablet Take 10 mg by mouth at bedtime.    Marland Kitchen QUEtiapine (SEROQUEL) 50 MG tablet Take  2 at night    . rifaximin (XIFAXAN) 550 MG TABS tablet Take 550 mg by mouth 2 (two) times daily.    . sertraline (ZOLOFT) 100 MG tablet Take 1 tab twice a day    . sodium chloride 1 g tablet Take 1 tablet (1 g total) by mouth 2 (two) times daily with a meal. 60 tablet 0  . spironolactone (ALDACTONE) 50 MG tablet Take 1 tablet (50 mg total) by mouth 2 (two) times daily. 60 tablet  0  . temazepam (RESTORIL) 30 MG capsule Take 30 mg by mouth at bedtime as needed.    . thiamine 100 MG tablet Take by mouth.    . topiramate (TOPAMAX) 50 MG tablet Take 50 mg by mouth at bedtime.    . traMADol (ULTRAM) 50 MG tablet TAKE 2 TABLETS BY MOUTH TWICE DAILY FOR BACK PAIN 120 tablet 1   No current facility-administered medications on file prior to visit.   Past Medical History:  Diagnosis Date  . Alcoholic cirrhosis of liver ([place])   . Anemia   . Bipolar disorder current episode depressed ([place])   . CKD (chronic kidney disease)   . Generalized anxiety disorder   . GERD (gastroesophageal reflux disease)   . Hypokalemia   . Intestinal malabsorption   . Korsakoff syndrome ([place])   . Other osteoporosis without current pathological fracture   . Other specified hypothyroidism   . Papillary thyroid carcinoma ([place]) 12/10/2015  . Portal  hypertension ([place])   . Stroke [facility])     left basal ganglia stroke (hemorrhagic)  . Vitamin D deficiency    Past Surgical History:  Procedure Laterality Date  . CATARACT EXTRACTION    . GASTRIC BYPASS  2005  . HEMORRHOID SURGERY    . HERNIA REPAIR    . metatarsus abductus surgery Left 1990  . PARTIAL HYSTERECTOMY  05/1997   BL Ovaries remain. Done for fibroids.  . THYROIDECTOMY  03/2013    Family History  Problem Relation Age of Onset  . Osteoporosis Mother   . Cancer Mother        Breast, lung, skin.    Social History   Socioeconomic History  . Marital status: Married    Spouse name: Not on file  . Number of children: 1  . Years of education: Not on file  . Highest education level: Not on file  Occupational History  . Occupation: Engineer, maintenance (IT)    Comment: Retired.  Tobacco Use  . Smoking status: Never Smoker  . Smokeless tobacco: Never Used  Substance and Sexual Activity  . Alcohol use: Not Currently    Comment: history of alcoholism. sober since 2014.  . Drug use: Never  . Sexual activity: Yes  Other Topics Concern  . Not on file  Social History Narrative   Right handed   Social Determinants of Health   Financial Resource Strain: Not on file  Food Insecurity: Not on file  Transportation Needs: Not on file  Physical Activity: Not on file  Stress: Not on file  Social Connections: Not on file    Review of Systems CONSTITUTIONAL: Negative for chills, fatigue, fever, unintentional weight gain and unintentional weight loss.  CARDIOVASCULAR: see HPI RESPIRATORY: Negative for recent cough and dyspnea.        Objective:  BP 110/70   Pulse 90   Temp (!) 97.1 F (36.2 C)   Ht 5\' 5"  (1.651 m)   Wt 160 lb (72.6 kg)   SpO2 95%   BMI 26.63 kg/m   BP/Weight 11/01/2020 01/03/736 1/0/6269  Systolic BP 485 462 92  Diastolic BP 70 64 58  Wt. (Lbs) 160 145 141  BMI 26.63 24.13 23.46    Physical Exam PHYSICAL EXAM:   VS: BP 110/70   Pulse 90   Temp (!) 97.1 F  (36.2 C)   Ht 5\' 5"  (1.651 m)   Wt 160 lb (72.6 kg)   SpO2 95%   BMI 26.63 kg/m   GEN: Well nourished, well developed,  in no acute distress  Cardiac: RRR; no murmurs, rubs, or gallops,has minimal to 1+ edema ---  Respiratory:  normal respiratory rate and pattern with no distress - normal breath sounds with no rales, rhonchi, wheezes or rubs MS: no deformity or atrophy  Skin: warm and dry, no rash  Diabetic Foot Exam - Simple   No data filed      Lab Results  Component Value Date   WBC 15.0 (H) 10/03/2020   HGB 8.8 (L) 10/03/2020   HCT 26.7 (L) 10/03/2020   PLT 349 10/03/2020   GLUCOSE CANCELED 10/03/2020   CHOL 137 05/08/2020   TRIG 55 05/08/2020   HDL 93 05/08/2020   LDLCALC 32 05/08/2020   ALT 26 10/03/2020   AST 24 10/03/2020   NA 130 (L) 10/03/2020   K CANCELED 10/03/2020   CL 93 (L) 10/03/2020   CREATININE 1.28 (H) 10/03/2020   BUN 28 (H) 10/03/2020   CO2 17 (L) 10/03/2020   TSH 2.050 04/16/2020      Assessment & Plan:   1. Peripheral edema - CBC with Differential/Platelet - Comprehensive metabolic panel - Pro b natriuretic peptide (BNP) continue current meds - recommend elevation of legs/compression hose/salt restriction   No orders of the defined types were placed in this encounter.   Orders Placed This Encounter  Procedures  . CBC with Differential/Platelet  . Comprehensive metabolic panel  . Pro b natriuretic peptide (BNP)     Follow-up: Return for with Dr cox as scheduled.  An After Visit Summary was printed and given to the patient.  Yetta Flock [facility] [phone]

## 2020-11-01 NOTE — Telephone Encounter (Signed)
Patient made an appointment on 11/01/2020.

## 2020-11-02 LAB — CBC WITH DIFFERENTIAL/PLATELET
Basophils Absolute: 0.1 10*3/uL (ref 0.0–0.2)
Basos: 1 %
EOS (ABSOLUTE): 0.8 10*3/uL — ABNORMAL HIGH (ref 0.0–0.4)
Eos: 6 %
Hematocrit: 26.1 % — ABNORMAL LOW (ref 34.0–46.6)
Hemoglobin: 8.5 g/dL — ABNORMAL LOW (ref 11.1–15.9)
Immature Grans (Abs): 0.4 10*3/uL — ABNORMAL HIGH (ref 0.0–0.1)
Immature Granulocytes: 3 %
Lymphocytes Absolute: 1.5 10*3/uL (ref 0.7–3.1)
Lymphs: 11 %
MCH: 34.1 pg — ABNORMAL HIGH (ref 26.6–33.0)
MCHC: 32.6 g/dL (ref 31.5–35.7)
MCV: 105 fL — ABNORMAL HIGH (ref 79–97)
Monocytes Absolute: 0.9 10*3/uL (ref 0.1–0.9)
Monocytes: 7 %
Neutrophils Absolute: 9.9 10*3/uL — ABNORMAL HIGH (ref 1.4–7.0)
Neutrophils: 72 %
Platelets: 405 10*3/uL (ref 150–450)
RBC: 2.49 x10E6/uL — CL (ref 3.77–5.28)
RDW: 12.9 % (ref 11.7–15.4)
WBC: 13.6 10*3/uL — ABNORMAL HIGH (ref 3.4–10.8)

## 2020-11-02 LAB — COMPREHENSIVE METABOLIC PANEL
ALT: 22 IU/L (ref 0–32)
AST: 21 IU/L (ref 0–40)
Albumin/Globulin Ratio: 1.1 — ABNORMAL LOW (ref 1.2–2.2)
Albumin: 2.6 g/dL — ABNORMAL LOW (ref 3.8–4.9)
Alkaline Phosphatase: 127 IU/L — ABNORMAL HIGH (ref 44–121)
BUN/Creatinine Ratio: 20 (ref 9–23)
BUN: 27 mg/dL — ABNORMAL HIGH (ref 6–24)
Bilirubin Total: <0.2 mg/dL (ref 0.0–1.2)
CO2: 22 mmol/L (ref 20–29)
Calcium: 9.4 mg/dL (ref 8.7–10.2)
Chloride: 98 mmol/L (ref 96–106)
Creatinine, Ser: 1.32 mg/dL — ABNORMAL HIGH (ref 0.57–1.00)
Globulin, Total: 2.4 g/dL (ref 1.5–4.5)
Glucose: 86 mg/dL (ref 65–99)
Potassium: 4.5 mmol/L (ref 3.5–5.2)
Sodium: 136 mmol/L (ref 134–144)
Total Protein: 5 g/dL — ABNORMAL LOW (ref 6.0–8.5)
eGFR: 47 mL/min/{1.73_m2} — ABNORMAL LOW (ref >59)

## 2020-11-02 LAB — PRO B NATRIURETIC PEPTIDE: NT-Pro BNP: 2144 pg/mL — ABNORMAL HIGH (ref 0–287)

## 2020-11-04 ENCOUNTER — Other Ambulatory Visit: Payer: Self-pay | Admitting: Physician Assistant

## 2020-11-04 DIAGNOSIS — R600 Localized edema: Secondary | ICD-10-CM

## 2020-11-04 DIAGNOSIS — R7989 Other specified abnormal findings of blood chemistry: Secondary | ICD-10-CM

## 2020-11-05 ENCOUNTER — Other Ambulatory Visit: Payer: Self-pay | Admitting: Family Medicine

## 2020-11-05 ENCOUNTER — Telehealth: Payer: Self-pay

## 2020-11-05 MED ORDER — CEFDINIR 300 MG PO CAPS: 300.0000 mg | ORAL_CAPSULE | Freq: Two times a day (BID) | ORAL | 0 refills | Status: DC

## 2020-11-05 MED ORDER — AZITHROMYCIN 250 MG PO TABS: ORAL_TABLET | ORAL | 0 refills | Status: DC

## 2020-11-05 NOTE — Telephone Encounter (Signed)
Molly Parrish called with results of CT Abd & Pelvis that was obtained for hematuria showing kidney non-obstructing kidney stones in both kidneys. It also picked up acute RLL pneumonia.  Dr. Tobie Poet was made aware and medication was sent to the pharmacy.  Patient is scheduled to be seen in the office on Friday for follow-up.  Patient is aware that medications were sent to the pharmacy.

## 2020-11-08 ENCOUNTER — Ambulatory Visit: Payer: Medicare Other | Admitting: Family Medicine

## 2020-11-11 ENCOUNTER — Telehealth: Payer: Self-pay

## 2020-11-11 NOTE — Telephone Encounter (Signed)
Daughter, Lovena Le calling stating pt stating someone stopped by the house early this morning and left gifts. Daughter does not believe this is true and states pt is talking out of her head. Daughter attempted to get pt to take BP/oxygen sats. Pt kept getting error and stating neighbor took BP cuff. Advised daughter with change in mental status and not being able to get vitals at home for them to proceed to hospital. Daughter VU and stated she would take her. Daughter worried about pt.   Harrell Lark 11/11/20 9:49 AM

## 2020-11-11 NOTE — Telephone Encounter (Signed)
Insurance verification sent to [employer] for [employer].  [place] Medicare/Medicaid were unable to coordinate benefits.  The patient will need to contact the payer directly to obtain benefits and prior authorization requirements.  Patient's daughter Lovena Le was notified.  She will have her Mother call the [employer] [phone]

## 2020-11-11 NOTE — Telephone Encounter (Signed)
Daughter returned call. Daughter states pt is arguing w/ what is advised and asked to merge call. Pt, daughter and triage nurse on phone. Pt requests to come to office to check vitals. Advised pt with current symptoms we advise she go to hospital to be checked. Pt then states she had another fall this morning around 8 am. Again advised pt that hospital was best option due to symptoms and fall. Pt VU as well as daughter.   Pt also stated she is past due for prolia shot and wanted carolyn aware so she can get this.   Royce Macadamia, [place] 11/11/20 10:03 AM

## 2020-11-12 ENCOUNTER — Ambulatory Visit
Admission: RE | Admit: 2020-11-12 | Discharge: 2020-11-12 | Disposition: A | Discharge status: Home / Self Care | Payer: 59 | Source: Ambulatory Visit | Attending: Neurology | Admitting: Neurology

## 2020-11-12 DIAGNOSIS — R292 Abnormal reflex: Secondary | ICD-10-CM

## 2020-11-12 DIAGNOSIS — R270 Ataxia, unspecified: Secondary | ICD-10-CM

## 2020-11-13 ENCOUNTER — Telehealth: Payer: Self-pay

## 2020-11-13 NOTE — Telephone Encounter (Signed)
Reviewed MRI of cervical spine - there is evidence of arthritis that is pressing a bit on the spinal cord. This may be contributing to her increased reflexes and possibly contributing in part to her balance problems. Due to these findings, I would like her evaluated by neurosurgery.    Tried calling pt to give MRI results, No answer. LMOVM to call the office back.

## 2020-11-14 NOTE — Telephone Encounter (Signed)
Left message that report is not back.

## 2020-11-14 NOTE — Telephone Encounter (Signed)
Patient called in and left a message with AN stating she missed a call about results

## 2020-11-21 ENCOUNTER — Ambulatory Visit (INDEPENDENT_AMBULATORY_CARE_PROVIDER_SITE_OTHER): Payer: 59 | Admitting: Family Medicine

## 2020-11-21 ENCOUNTER — Other Ambulatory Visit: Payer: Self-pay

## 2020-11-21 VITALS — BP 110/68 | HR 116 | Temp 97.3°F | Resp 18 | Ht 65.0 in | Wt 154.0 lb

## 2020-11-21 DIAGNOSIS — R296 Repeated falls: Secondary | ICD-10-CM | POA: Diagnosis not present

## 2020-11-21 DIAGNOSIS — E43 Unspecified severe protein-calorie malnutrition: Secondary | ICD-10-CM | POA: Diagnosis not present

## 2020-11-21 DIAGNOSIS — R410 Disorientation, unspecified: Secondary | ICD-10-CM

## 2020-11-21 DIAGNOSIS — N1831 Chronic kidney disease, stage 3a: Secondary | ICD-10-CM

## 2020-11-21 DIAGNOSIS — F314 Bipolar disorder, current episode depressed, severe, without psychotic features: Secondary | ICD-10-CM

## 2020-11-21 NOTE — Progress Notes (Signed)
Subjective:  Patient ID: Molly Parrish, female    DOB: Oct 13, 1965  Age: 55 y.o. MRN: [id number]  Chief Complaint  Patient presents with  . Fall    HPI Pt was seen in ED 11/11/2020. Pt was having frequent falls, and still is, and was confused on the day of admission.  Urine drug screen and UA looked good.  Creatinine was slightly elevated at 1.6.  Calcium was elevated, anemia was present and stable.  Liver enzymes are slightly elevated which is baseline for this patient.  She has hypoalbuminemia again which is stable.  Chest x-ray was normal.  Ammonia level was normal. Calcium was held and Potassium was held. Calcium level was very high.  There was some concern from the daughter who found oxycodone in her mother's walker at home.  The patient has no prescription for this, however it was not in her system on the urine drug screen.  It is concerning to me that the Xanax was not in her system that I have been prescribing her however it is just as needed.  She did have tricyclic antidepressant on urine drug screen which I am unaware that she is taking.  PHQ9 SCORE ONLY 11/24/2020 11/21/2020 08/19/2020  PHQ-9 Total Score 25 20 17     Current Outpatient Medications on File Prior to Visit  Medication Sig Dispense Refill  . ALPRAZolam (XANAX) 0.25 MG tablet TAKE 1 TABLET(0.25 MG) BY MOUTH TWICE DAILY AS NEEDED 60 tablet 1  . calcium carbonate (OSCAL) 1500 (600 Ca) MG TABS tablet Take 2,400 mg of elemental calcium by mouth 2 (two) times daily with a meal.    . cyanocobalamin 1000 MCG tablet Take 1,000 mcg by mouth at bedtime.    . famotidine (PEPCID) 40 MG tablet Take 40 mg by mouth at bedtime.    . furosemide (LASIX) 40 MG tablet TAKE 2 TABLETS BY MOUTH TWICE DAILY 360 tablet 1  . Iron-FA-B Cmp-C-Biot-Probiotic (FUSION PLUS) CAPS Take by mouth 2 (two) times daily.     . Iron-Vitamins (GERITOL COMPLETE) TABS Take 1 tablet by mouth daily.    Marland Kitchen lamoTRIgine (LAMICTAL) 200 MG tablet Take 1 at night    .  levothyroxine (SYNTHROID) 200 MCG tablet Patient takes 1 tablets M-F and 1/2 Sat and Sun    . magnesium oxide (MAG-OX) 400 MG tablet Take by mouth.    . midodrine (PROAMATINE) 2.5 MG tablet TAKE 1 TABLET(2.5 MG) BY MOUTH THREE TIMES DAILY WITH MEALS 90 tablet 0  . mirtazapine (REMERON) 15 MG tablet TAKE 1 TABLET(15 MG) BY MOUTH AT BEDTIME 30 tablet 0  . omeprazole (PRILOSEC) 40 MG capsule Take 1 capsule by mouth at bedtime.    . promethazine (PHENERGAN) 25 MG tablet TAKE 1 TABLET BY MOUTH FOUR TIMES DAILY AS NEEDED FOR NAUSEA OR VOMITING 40 tablet 2  . propranolol (INDERAL) 10 MG tablet Take 10 mg by mouth at bedtime.    Marland Kitchen QUEtiapine (SEROQUEL) 50 MG tablet Take  2 at night    . rifaximin (XIFAXAN) 550 MG TABS tablet Take 550 mg by mouth 2 (two) times daily.    . sertraline (ZOLOFT) 100 MG tablet Take 1 tab twice a day    . sodium chloride 1 g tablet Take 1 tablet (1 g total) by mouth 2 (two) times daily with a meal. 60 tablet 0  . spironolactone (ALDACTONE) 50 MG tablet Take 1 tablet (50 mg total) by mouth 2 (two) times daily. 60 tablet 0  . temazepam (RESTORIL)  30 MG capsule Take 30 mg by mouth at bedtime as needed.    . thiamine 100 MG tablet Take by mouth.    . topiramate (TOPAMAX) 50 MG tablet Take 50 mg by mouth at bedtime.    . traMADol (ULTRAM) 50 MG tablet TAKE 2 TABLETS BY MOUTH TWICE DAILY FOR BACK PAIN 120 tablet 1   No current facility-administered medications on file prior to visit.   Past Medical History:  Diagnosis Date  . Alcoholic cirrhosis of liver ([place])   . Anemia   . Bipolar disorder current episode depressed ([place])   . CKD (chronic kidney disease)   . Generalized anxiety disorder   . GERD (gastroesophageal reflux disease)   . Hypokalemia   . Intestinal malabsorption   . Korsakoff syndrome ([place])   . Other osteoporosis without current pathological fracture   . Other specified hypothyroidism   . Papillary thyroid carcinoma ([place]) 12/10/2015  . Portal hypertension  ([place])   . Stroke [facility])     left basal ganglia stroke (hemorrhagic)  . Vitamin D deficiency    Past Surgical History:  Procedure Laterality Date  . CATARACT EXTRACTION    . GASTRIC BYPASS  2005  . HEMORRHOID SURGERY    . HERNIA REPAIR    . metatarsus abductus surgery Left 1990  . PARTIAL HYSTERECTOMY  05/1997   BL Ovaries remain. Done for fibroids.  . THYROIDECTOMY  03/2013    Family History  Problem Relation Age of Onset  . Osteoporosis Mother   . Cancer Mother        Breast, lung, skin.    Social History   Socioeconomic History  . Marital status: Married    Spouse name: Not on file  . Number of children: 1  . Years of education: Not on file  . Highest education level: Not on file  Occupational History  . Occupation: Engineer, maintenance (IT)    Comment: Retired.  Tobacco Use  . Smoking status: Never Smoker  . Smokeless tobacco: Never Used  Substance and Sexual Activity  . Alcohol use: Not Currently    Comment: history of alcoholism. sober since 2014.  . Drug use: Never  . Sexual activity: Yes  Other Topics Concern  . Not on file  Social History Narrative   Right handed   Social Determinants of Health   Financial Resource Strain: Not on file  Food Insecurity: Not on file  Transportation Needs: Not on file  Physical Activity: Not on file  Stress: Not on file  Social Connections: Not on file    Review of Systems  Constitutional: Positive for fatigue and unexpected weight change. Negative for chills and fever.  HENT: Negative for congestion, ear pain and sore throat.   Respiratory: Positive for shortness of breath. Negative for cough.   Cardiovascular: Positive for palpitations. Negative for chest pain.  Gastrointestinal: Positive for nausea and vomiting. Negative for abdominal pain, constipation and diarrhea.  Endocrine: Positive for polydipsia. Negative for polyphagia.  Genitourinary: Positive for hematuria. Negative for dysuria and urgency.       Poor bladder control   Musculoskeletal: Positive for back pain. Negative for arthralgias and myalgias.  Skin: Positive for rash.  Neurological: Positive for weakness and headaches. Negative for dizziness.  Psychiatric/Behavioral: Negative for dysphoric mood. The patient is not nervous/anxious.      Objective:  BP 110/68   Pulse (!) 116   Temp (!) 97.3 F (36.3 C)   Resp 18   Ht 5\' 5"  (1.651 m)  Wt 154 lb (69.9 kg)   BMI 25.63 kg/m   BP/Weight 11/21/2020 11/01/2020 10/10/4008  Systolic BP 272 536 644  Diastolic BP 68 70 64  Wt. (Lbs) 154 160 145  BMI 25.63 26.63 24.13    Physical Exam Vitals reviewed.  Constitutional:      Appearance: Normal appearance. She is normal weight.  Neck:     Vascular: No carotid bruit.  Cardiovascular:     Rate and Rhythm: Normal rate and regular rhythm.     Pulses: Normal pulses.     Heart sounds: Normal heart sounds.  Pulmonary:     Effort: Pulmonary effort is normal. No respiratory distress.     Breath sounds: Normal breath sounds.  Abdominal:     General: Abdomen is flat. Bowel sounds are normal.     Palpations: Abdomen is soft.     Tenderness: There is no abdominal tenderness.  Skin:    Findings: Lesion (rt mid foot laterally. Healing. Seeing podiatry) present.  Neurological:     Mental Status: She is alert and oriented to person, place, and time.     Gait: Gait abnormal (slowed. uses walker. ).  Psychiatric:        Mood and Affect: Mood normal.        Behavior: Behavior normal.     Diabetic Foot Exam - Simple   No data filed      Lab Results  Component Value Date   WBC 13.8 (H) 11/22/2020   HGB 8.9 (L) 11/22/2020   HCT 27.4 (L) 11/22/2020   PLT 302 11/22/2020   GLUCOSE 109 (H) 11/22/2020   CHOL 137 05/08/2020   TRIG 55 05/08/2020   HDL 93 05/08/2020   LDLCALC 32 05/08/2020   ALT 19 11/22/2020   AST 16 11/22/2020   NA 135 11/22/2020   K 3.8 11/22/2020   CL 96 11/22/2020   CREATININE 1.62 (H) 11/22/2020   BUN 50 (H) 11/22/2020   CO2  22 11/22/2020   TSH 2.050 04/16/2020      Assessment & Plan:   1. Stage 3a chronic kidney disease ([place]) On labs that came back on basis it showed creatinine had bumped to 1.72.  I had the patient come in and repeat her chemistry panel and it had improved to 1.62.  He was still not back to its baseline.  Calcium continues to be elevated and in fact worsened to 11.5 from 10.9 despite her holding her calcium and potassium.  Her potassium was fine it was 3.8. - CBC with Differential/Platelet - Comprehensive metabolic panel  2. Falls frequently Patient to consider physical therapy.  3. Severe protein-calorie malnutrition ([place]) Patient is drinking protein drinks.  Significant hypoalbuminemia.  4. Hypercalcemia Persistent and worsening  5. Mental confusion Numerous causes possible.  Today she seems lucid.  6. Bipolar disorder with severe depression ([place]) Worsening.  Discuss with patient about follow-up with Dr. Erling Cruz is soon as possible.  No orders of the defined types were placed in this encounter.   Orders Placed This Encounter  Procedures  . CBC with Differential/Platelet  . Comprehensive metabolic panel     Follow-up: Return in about 4 weeks (around 12/19/2020).  An After Visit Summary was printed and given to the patient.  Rochel Brome, MD [facility] [phone]

## 2020-11-22 ENCOUNTER — Other Ambulatory Visit: Payer: Self-pay | Admitting: Family Medicine

## 2020-11-22 DIAGNOSIS — N1832 Chronic kidney disease, stage 3b: Secondary | ICD-10-CM

## 2020-11-22 DIAGNOSIS — D638 Anemia in other chronic diseases classified elsewhere: Secondary | ICD-10-CM

## 2020-11-22 LAB — CBC WITH DIFFERENTIAL/PLATELET
Basophils Absolute: 0.1 10*3/uL (ref 0.0–0.2)
Basophils Absolute: 0.1 x10E3/uL (ref 0.0–0.2)
Basos: 1 %
Basos: 1 %
EOS (ABSOLUTE): 0.4 10*3/uL (ref 0.0–0.4)
EOS (ABSOLUTE): 0.6 x10E3/uL — ABNORMAL HIGH (ref 0.0–0.4)
Eos: 2 %
Eos: 4 %
Hematocrit: 29.7 % — ABNORMAL LOW (ref 34.0–46.6)
Hematocrit: 27.4 % — ABNORMAL LOW (ref 34.0–46.6)
Hemoglobin: 9.7 g/dL — ABNORMAL LOW (ref 11.1–15.9)
Hemoglobin: 8.9 g/dL — ABNORMAL LOW (ref 11.1–15.9)
Immature Grans (Abs): 0.2 10*3/uL — ABNORMAL HIGH (ref 0.0–0.1)
Immature Grans (Abs): 0.1 x10E3/uL (ref 0.0–0.1)
Immature Granulocytes: 1 %
Immature Granulocytes: 1 %
Lymphocytes Absolute: 1 10*3/uL (ref 0.7–3.1)
Lymphocytes Absolute: 1.1 x10E3/uL (ref 0.7–3.1)
Lymphs: 5 %
Lymphs: 8 %
MCH: 32.2 pg (ref 26.6–33.0)
MCH: 32.5 pg (ref 26.6–33.0)
MCHC: 32.7 g/dL (ref 31.5–35.7)
MCHC: 32.5 g/dL (ref 31.5–35.7)
MCV: 99 fL — ABNORMAL HIGH (ref 79–97)
MCV: 100 fL — ABNORMAL HIGH (ref 79–97)
Monocytes Absolute: 1.1 10*3/uL — ABNORMAL HIGH (ref 0.1–0.9)
Monocytes Absolute: 0.9 x10E3/uL (ref 0.1–0.9)
Monocytes: 6 %
Monocytes: 7 %
Neutrophils Absolute: 15.7 10*3/uL — ABNORMAL HIGH (ref 1.4–7.0)
Neutrophils Absolute: 11 x10E3/uL — ABNORMAL HIGH (ref 1.4–7.0)
Neutrophils: 85 %
Neutrophils: 79 %
Platelets: 331 10*3/uL (ref 150–450)
Platelets: 302 x10E3/uL (ref 150–450)
RBC: 3.01 x10E6/uL — ABNORMAL LOW (ref 3.77–5.28)
RBC: 2.74 x10E6/uL — CL (ref 3.77–5.28)
RDW: 13.5 % (ref 11.7–15.4)
RDW: 13.2 % (ref 11.7–15.4)
WBC: 18.4 10*3/uL — ABNORMAL HIGH (ref 3.4–10.8)
WBC: 13.8 x10E3/uL — ABNORMAL HIGH (ref 3.4–10.8)

## 2020-11-22 LAB — COMPREHENSIVE METABOLIC PANEL
ALT: 22 IU/L (ref 0–32)
AST: 20 IU/L (ref 0–40)
Albumin/Globulin Ratio: 1.1 — ABNORMAL LOW (ref 1.2–2.2)
Albumin: 3.1 g/dL — ABNORMAL LOW (ref 3.8–4.9)
Alkaline Phosphatase: 129 IU/L — ABNORMAL HIGH (ref 44–121)
BUN/Creatinine Ratio: 31 — ABNORMAL HIGH (ref 9–23)
BUN: 53 mg/dL — ABNORMAL HIGH (ref 6–24)
Bilirubin Total: <0.2 mg/dL (ref 0.0–1.2)
CO2: 19 mmol/L — ABNORMAL LOW (ref 20–29)
Calcium: 10.9 mg/dL — ABNORMAL HIGH (ref 8.7–10.2)
Chloride: 90 mmol/L — ABNORMAL LOW (ref 96–106)
Creatinine, Ser: 1.72 mg/dL — ABNORMAL HIGH (ref 0.57–1.00)
Globulin, Total: 2.7 g/dL (ref 1.5–4.5)
Glucose: 84 mg/dL (ref 65–99)
Potassium: 3.7 mmol/L (ref 3.5–5.2)
Sodium: 135 mmol/L (ref 134–144)
Total Protein: 5.8 g/dL — ABNORMAL LOW (ref 6.0–8.5)
eGFR: 34 mL/min/{1.73_m2} — ABNORMAL LOW (ref >59)

## 2020-11-22 LAB — COMPREHENSIVE METABOLIC PANEL WITH GFR
ALT: 19 IU/L (ref 0–32)
AST: 16 IU/L (ref 0–40)
Albumin/Globulin Ratio: 1.2 (ref 1.2–2.2)
Albumin: 2.8 g/dL — ABNORMAL LOW (ref 3.8–4.9)
Alkaline Phosphatase: 111 IU/L (ref 44–121)
BUN/Creatinine Ratio: 31 — ABNORMAL HIGH (ref 9–23)
BUN: 50 mg/dL — ABNORMAL HIGH (ref 6–24)
Bilirubin Total: <0.2 mg/dL (ref 0.0–1.2)
CO2: 22 mmol/L (ref 20–29)
Calcium: 11.5 mg/dL — ABNORMAL HIGH (ref 8.7–10.2)
Chloride: 96 mmol/L (ref 96–106)
Creatinine, Ser: 1.62 mg/dL — ABNORMAL HIGH (ref 0.57–1.00)
Globulin, Total: 2.4 g/dL (ref 1.5–4.5)
Glucose: 109 mg/dL — ABNORMAL HIGH (ref 65–99)
Potassium: 3.8 mmol/L (ref 3.5–5.2)
Sodium: 135 mmol/L (ref 134–144)
Total Protein: 5.2 g/dL — ABNORMAL LOW (ref 6.0–8.5)
eGFR: 37 mL/min/1.73 — ABNORMAL LOW (ref >59)

## 2020-11-24 ENCOUNTER — Encounter: Payer: Self-pay | Admitting: Family Medicine

## 2020-11-24 ENCOUNTER — Other Ambulatory Visit: Payer: Self-pay | Admitting: Family Medicine

## 2020-11-25 ENCOUNTER — Other Ambulatory Visit: Payer: 59

## 2020-11-25 ENCOUNTER — Other Ambulatory Visit: Payer: Self-pay

## 2020-11-25 DIAGNOSIS — D638 Anemia in other chronic diseases classified elsewhere: Secondary | ICD-10-CM

## 2020-11-26 LAB — CBC
Hematocrit: 29.4 % — ABNORMAL LOW (ref 34.0–46.6)
Hemoglobin: 9.4 g/dL — ABNORMAL LOW (ref 11.1–15.9)
MCH: 31.9 pg (ref 26.6–33.0)
MCHC: 32 g/dL (ref 31.5–35.7)
MCV: 100 fL — ABNORMAL HIGH (ref 79–97)
Platelets: 374 10*3/uL (ref 150–450)
RBC: 2.95 x10E6/uL — ABNORMAL LOW (ref 3.77–5.28)
RDW: 13.3 % (ref 11.7–15.4)
WBC: 13.6 10*3/uL — ABNORMAL HIGH (ref 3.4–10.8)

## 2020-11-26 LAB — COMPREHENSIVE METABOLIC PANEL
ALT: 14 IU/L (ref 0–32)
AST: 16 IU/L (ref 0–40)
Albumin/Globulin Ratio: 1.1 — ABNORMAL LOW (ref 1.2–2.2)
Albumin: 3 g/dL — ABNORMAL LOW (ref 3.8–4.9)
Alkaline Phosphatase: 128 IU/L — ABNORMAL HIGH (ref 44–121)
BUN/Creatinine Ratio: 31 — ABNORMAL HIGH (ref 9–23)
BUN: 42 mg/dL — ABNORMAL HIGH (ref 6–24)
Bilirubin Total: <0.2 mg/dL (ref 0.0–1.2)
CO2: 24 mmol/L (ref 20–29)
Calcium: 9.7 mg/dL (ref 8.7–10.2)
Chloride: 95 mmol/L — ABNORMAL LOW (ref 96–106)
Creatinine, Ser: 1.37 mg/dL — ABNORMAL HIGH (ref 0.57–1.00)
Globulin, Total: 2.7 g/dL (ref 1.5–4.5)
Glucose: 66 mg/dL (ref 65–99)
Potassium: 4.3 mmol/L (ref 3.5–5.2)
Sodium: 135 mmol/L (ref 134–144)
Total Protein: 5.7 g/dL — ABNORMAL LOW (ref 6.0–8.5)
eGFR: 45 mL/min/{1.73_m2} — ABNORMAL LOW (ref >59)

## 2020-11-26 LAB — PTH, INTACT AND CALCIUM: PTH: 6 pg/mL — ABNORMAL LOW (ref 15–65)

## 2020-12-02 DIAGNOSIS — E038 Other specified hypothyroidism: Secondary | ICD-10-CM | POA: Insufficient documentation

## 2020-12-02 DIAGNOSIS — D631 Anemia in chronic kidney disease: Secondary | ICD-10-CM | POA: Insufficient documentation

## 2020-12-02 DIAGNOSIS — E559 Vitamin D deficiency, unspecified: Secondary | ICD-10-CM | POA: Insufficient documentation

## 2020-12-02 DIAGNOSIS — N189 Chronic kidney disease, unspecified: Secondary | ICD-10-CM | POA: Insufficient documentation

## 2020-12-02 DIAGNOSIS — E876 Hypokalemia: Secondary | ICD-10-CM | POA: Insufficient documentation

## 2020-12-02 DIAGNOSIS — K219 Gastro-esophageal reflux disease without esophagitis: Secondary | ICD-10-CM | POA: Insufficient documentation

## 2020-12-02 DIAGNOSIS — F3130 Bipolar disorder, current episode depressed, mild or moderate severity, unspecified: Secondary | ICD-10-CM | POA: Insufficient documentation

## 2020-12-02 DIAGNOSIS — D649 Anemia, unspecified: Secondary | ICD-10-CM | POA: Insufficient documentation

## 2020-12-12 ENCOUNTER — Encounter: Payer: Self-pay | Admitting: Nurse Practitioner

## 2020-12-12 ENCOUNTER — Encounter: Payer: Self-pay | Admitting: Family Medicine

## 2020-12-12 ENCOUNTER — Ambulatory Visit (INDEPENDENT_AMBULATORY_CARE_PROVIDER_SITE_OTHER): Payer: 59 | Admitting: Nurse Practitioner

## 2020-12-12 VITALS — BP 122/72 | HR 71 | Temp 97.4°F | Ht 65.0 in | Wt 156.0 lb

## 2020-12-12 DIAGNOSIS — R52 Pain, unspecified: Secondary | ICD-10-CM

## 2020-12-12 DIAGNOSIS — R112 Nausea with vomiting, unspecified: Secondary | ICD-10-CM | POA: Diagnosis not present

## 2020-12-12 DIAGNOSIS — J3089 Other allergic rhinitis: Secondary | ICD-10-CM | POA: Diagnosis not present

## 2020-12-12 DIAGNOSIS — R059 Cough, unspecified: Secondary | ICD-10-CM | POA: Diagnosis not present

## 2020-12-12 DIAGNOSIS — J329 Chronic sinusitis, unspecified: Secondary | ICD-10-CM | POA: Diagnosis not present

## 2020-12-12 LAB — POCT RAPID STREP A (OFFICE): Rapid Strep A Screen: NEGATIVE

## 2020-12-12 LAB — POCT INFLUENZA A/B
Influenza A, POC: NEGATIVE
Influenza B, POC: NEGATIVE

## 2020-12-12 LAB — POC COVID19 BINAXNOW: SARS Coronavirus 2 Ag: NEGATIVE

## 2020-12-12 MED ORDER — FLUTICASONE PROPIONATE 50 MCG/ACT NA SUSP: 2.0000 | Freq: Every day | NASAL | 6 refills | Status: DC

## 2020-12-12 MED ORDER — AMOXICILLIN 875 MG PO TABS: 875.0000 mg | ORAL_TABLET | Freq: Two times a day (BID) | ORAL | 0 refills | Status: DC

## 2020-12-12 MED ORDER — ONDANSETRON 4 MG PO TBDP
4.0000 mg | ORAL_TABLET | Freq: Three times a day (TID) | ORAL | 0 refills | Status: DC | PRN
Start: 2020-12-12 — End: 2021-03-25

## 2020-12-12 NOTE — Patient Instructions (Addendum)
Rest and push fluids May take Delsym cough syrup over the counter Zofran 4 mg ODT for nausea and vomiting Follow-up as needed  Sinusitis, Adult Sinusitis is soreness and swelling (inflammation) of your sinuses. Sinuses are hollow spaces in the bones around your face. They are located:  Around your eyes.  In the middle of your forehead.  Behind your nose.  In your cheekbones. Your sinuses and nasal passages are lined with a fluid called mucus. Mucus drains out of your sinuses. Swelling can trap mucus in your sinuses. This lets germs (bacteria, virus, or fungus) grow, which leads to infection. Most of the time, this condition is caused by a virus. What are the causes? This condition is caused by:  Allergies.  Asthma.  Germs.  Things that block your nose or sinuses.  Growths in the nose (nasal polyps).  Chemicals or irritants in the air.  Fungus (rare). What increases the risk? You are more likely to develop this condition if:  You have a weak body defense system (immune system).  You do a lot of swimming or diving.  You use nasal sprays too much.  You smoke. What are the signs or symptoms? The main symptoms of this condition are pain and a feeling of pressure around the sinuses. Other symptoms include:  Stuffy nose (congestion).  Runny nose (drainage).  Swelling and warmth in the sinuses.  Headache.  Toothache.  A cough that may get worse at night.  Mucus that collects in the throat or the back of the nose (postnasal drip).  Being unable to smell and taste.  Being very tired (fatigue).  A fever.  Sore throat.  Bad breath. How is this diagnosed? This condition is diagnosed based on:  Your symptoms.  Your medical history.  A physical exam.  Tests to find out if your condition is short-term (acute) or long-term (chronic). Your doctor may: ? Check your nose for growths (polyps). ? Check your sinuses using a tool that has a light  (endoscope). ? Check for allergies or germs. ? Do imaging tests, such as an MRI or CT scan. How is this treated? Treatment for this condition depends on the cause and whether it is short-term or long-term.  If caused by a virus, your symptoms should go away on their own within 10 days. You may be given medicines to relieve symptoms. They include: ? Medicines that shrink swollen tissue in the nose. ? Medicines that treat allergies (antihistamines). ? A spray that treats swelling of the nostrils. ? Rinses that help get rid of thick mucus in your nose (nasal saline washes).  If caused by bacteria, your doctor may wait to see if you will get better without treatment. You may be given antibiotic medicine if you have: ? A very bad infection. ? A weak body defense system.  If caused by growths in the nose, you may need to have surgery. Follow these instructions at home: Medicines  Take, use, or apply over-the-counter and prescription medicines only as told by your doctor. These may include nasal sprays.  If you were prescribed an antibiotic medicine, take it as told by your doctor. Do not stop taking the antibiotic even if you start to feel better. Hydrate and humidify  Drink enough water to keep your pee (urine) pale yellow.  Use a cool mist humidifier to keep the humidity level in your home above 50%.  Breathe in steam for 10-15 minutes, 3-4 times a day, or as told by your doctor. You  can do this in the bathroom while a hot shower is running.  Try not to spend time in cool or dry air.   Rest  Rest as much as you can.  Sleep with your head raised (elevated).  Make sure you get enough sleep each night. General instructions  Put a warm, moist washcloth on your face 3-4 times a day, or as often as told by your doctor. This will help with discomfort.  Wash your hands often with soap and water. If there is no soap and water, use hand sanitizer.  Do not smoke. Avoid being around  people who are smoking (secondhand smoke).  Keep all follow-up visits as told by your doctor. This is important.   Contact a doctor if:  You have a fever.  Your symptoms get worse.  Your symptoms do not get better within 10 days. Get help right away if:  You have a very bad headache.  You cannot stop throwing up (vomiting).  You have very bad pain or swelling around your face or eyes.  You have trouble seeing.  You feel confused.  Your neck is stiff.  You have trouble breathing. Summary  Sinusitis is swelling of your sinuses. Sinuses are hollow spaces in the bones around your face.  This condition is caused by tissues in your nose that become inflamed or swollen. This traps germs. These can lead to infection.  If you were prescribed an antibiotic medicine, take it as told by your doctor. Do not stop taking it even if you start to feel better.  Keep all follow-up visits as told by your doctor. This is important. This information is not intended to replace advice given to you by your health care provider. Make sure you discuss any questions you have with your health care provider. Document Revised: 12/06/2017 Document Reviewed: 12/06/2017 Elsevier Patient Education  2021 [employer]. Allergic Rhinitis, Adult Allergic rhinitis is a reaction to allergens. Allergens are things that can cause an allergic reaction. This condition affects the lining inside the nose (mucous membrane). There are two types of allergic rhinitis:  Seasonal. This type is also called hay fever. It happens only during some times of the year.  Perennial. This type can happen at any time of the year. This condition cannot be spread from person to person (is not contagious). It can be mild, worse, or very bad. It can develop at any age and may be outgrown. What are the causes? This condition may be caused by:  Pollen from grasses, trees, and weeds.  Dust mites.  Smoke.  Mold.  Car fumes.  The  pee (urine), spit, or dander of pets. Dander is dead skin cells from a pet.   What increases the risk? You are more likely to develop this condition if:  You have allergies in your family.  You have problems like allergies in your family. You may have: ? Swelling of parts of your eyes and eyelids. ? Asthma. This affects how you breathe. ? Long-term redness and swelling on your skin. ? Food allergies. What are the signs or symptoms? The main symptom of this condition is a runny or stuffy nose (nasal congestion). Other symptoms may include:  Sneezing or coughing.  Itching and tearing of your eyes.  Mucus that drips down the back of your throat (postnasal drip).  Trouble sleeping.  Feeling tired.  Headache.  Sore throat. How is this treated? There is no cure for this condition. You should avoid things that you  are allergic to. Treatment can help to relieve symptoms. This may include:  Medicines that block allergy symptoms, such as corticosteroids or antihistamines. These may be given as a shot, nasal spray, or pill.  Avoiding things you are allergic to.  Medicines that give you bits of what you are allergic to over time. This is called immunotherapy. It is done if other treatments do not help. You may get: ? Shots. ? Medicine under your tongue.  Stronger medicines, if other treatments do not help. Follow these instructions at home: Avoiding allergens Find out what things you are allergic to and avoid them. To do this, try these things:  If you get allergies any time of year: ? Replace carpet with wood, tile, or vinyl flooring. Carpet can trap pet dander and dust. ? Do not smoke. Do not allow smoking in your home. ? Change your heating and air conditioning filters at least once a month.  If you get allergies only some times of the year: ? Keep windows closed when you can. ? Plan things to do outside when pollen counts are lowest. Check pollen counts before you plan  things to do outside. ? When you come indoors, change your clothes and shower before you sit on furniture or bedding.   If you are allergic to a pet: ? Keep the pet out of your bedroom. ? Vacuum, sweep, and dust often.   General instructions  Take over-the-counter and prescription medicines only as told by your doctor.  Drink enough fluid to keep your pee (urine) pale yellow.  Keep all follow-up visits as told by your doctor. This is important. Where to find more information  American Academy of Allergy, Asthma & Immunology: www.aaaai.org Contact a doctor if:  You have a fever.  You get a cough that does not go away.  You make whistling sounds when you breathe (wheeze).  Your symptoms slow you down.  Your symptoms stop you from doing your normal things each day. Get help right away if:  You are short of breath. This symptom may be an emergency. Do not wait to see if the symptom will go away. Get medical help right away. Call your local emergency services (911 in the [place] Do not drive yourself to the hospital. Summary  Allergic rhinitis may be treated by taking medicines and avoiding things you are allergic to.  If you have allergies only some of the year, keep windows closed when you can at those times.  Contact your doctor if you get a fever or a cough that does not go away. This information is not intended to replace advice given to you by your health care provider. Make sure you discuss any questions you have with your health care provider. Document Revised: 08/28/2019 Document Reviewed: 07/04/2019 Elsevier Patient Education  2021 [employer].

## 2020-12-12 NOTE — Progress Notes (Signed)
Acute Office Visit  Subjective:    Patient ID: Molly Parrish, female    DOB: August 12, 1965, 55 y.o.   MRN: [id number]  Chief Complaint  Patient presents with  . Cough        HPI Patient is in today for sinus congestion/pain, post-nasal-drip, ear pain, and cough. Onset of symptoms was 3-days ago. Treatment has included Robitussin DM and Ibuprofen. Denies known exposure to ill-contacts.   Upper respiratory symptoms She complains of left ear pressure/pain, congestion and cough described as nonproductive.with no fever, chills, night sweats or weight loss. Onset of symptoms was a few days ago and gradually worsening.She has had very poor oral intake.  Past history is significant for no history of pneumonia or bronchitis. Patient is non-smoker    Past Medical History:  Diagnosis Date  . Alcoholic cirrhosis of liver ([place])   . Anemia   . Bipolar disorder current episode depressed ([place])   . CKD (chronic kidney disease)   . Generalized anxiety disorder   . GERD (gastroesophageal reflux disease)   . Hypokalemia   . Intestinal malabsorption   . Korsakoff syndrome ([place])   . Other osteoporosis without current pathological fracture   . Other specified hypothyroidism   . Papillary thyroid carcinoma ([place]) 12/10/2015  . Portal hypertension ([place])   . Stroke [facility])     left basal ganglia stroke (hemorrhagic)  . Vitamin D deficiency     Past Surgical History:  Procedure Laterality Date  . CATARACT EXTRACTION    . GASTRIC BYPASS  2005  . HEMORRHOID SURGERY    . HERNIA REPAIR    . metatarsus abductus surgery Left 1990  . PARTIAL HYSTERECTOMY  05/1997   BL Ovaries remain. Done for fibroids.  . THYROIDECTOMY  03/2013    Family History  Problem Relation Age of Onset  . Osteoporosis Mother   . Cancer Mother        Breast, lung, skin.     Social History   Socioeconomic History  . Marital status: Married    Spouse name: Not on file  . Number of children: 1  . Years of education:  Not on file  . Highest education level: Not on file  Occupational History  . Occupation: Engineer, maintenance (IT)    Comment: Retired.  Tobacco Use  . Smoking status: Never Smoker  . Smokeless tobacco: Never Used  Substance and Sexual Activity  . Alcohol use: Not Currently    Comment: history of alcoholism. sober since 2014.  . Drug use: Never  . Sexual activity: Yes  Other Topics Concern  . Not on file  Social History Narrative   Right handed   Social Determinants of Health   Financial Resource Strain: Not on file  Food Insecurity: Not on file  Transportation Needs: Not on file  Physical Activity: Not on file  Stress: Not on file  Social Connections: Not on file  Intimate Partner Violence: Not on file    Outpatient Medications Prior to Visit  Medication Sig Dispense Refill  . ALPRAZolam (XANAX) 0.25 MG tablet TAKE 1 TABLET(0.25 MG) BY MOUTH TWICE DAILY AS NEEDED 60 tablet 1  . calcium carbonate (OSCAL) 1500 (600 Ca) MG TABS tablet Take 2,400 mg of elemental calcium by mouth 2 (two) times daily with a meal.    . cyanocobalamin 1000 MCG tablet Take 1,000 mcg by mouth at bedtime.    . famotidine (PEPCID) 40 MG tablet Take 40 mg by mouth at bedtime.    . furosemide (LASIX)  40 MG tablet TAKE 2 TABLETS BY MOUTH TWICE DAILY 360 tablet 1  . Iron-FA-B Cmp-C-Biot-Probiotic (FUSION PLUS) CAPS Take by mouth 2 (two) times daily.     . Iron-Vitamins (GERITOL COMPLETE) TABS Take 1 tablet by mouth daily.    Marland Kitchen lamoTRIgine (LAMICTAL) 200 MG tablet Take 1 at night    . levothyroxine (SYNTHROID) 200 MCG tablet Patient takes 1 tablets M-F and 1/2 Sat and Sun    . magnesium oxide (MAG-OX) 400 MG tablet Take by mouth.    . midodrine (PROAMATINE) 2.5 MG tablet TAKE 1 TABLET(2.5 MG) BY MOUTH THREE TIMES DAILY WITH MEALS 90 tablet 0  . mirtazapine (REMERON) 15 MG tablet TAKE 1 TABLET(15 MG) BY MOUTH AT BEDTIME 30 tablet 0  . omeprazole (PRILOSEC) 40 MG capsule Take 1 capsule by mouth at bedtime.    . promethazine  (PHENERGAN) 25 MG tablet TAKE 1 TABLET BY MOUTH FOUR TIMES DAILY AS NEEDED FOR NAUSEA OR VOMITING 40 tablet 2  . propranolol (INDERAL) 10 MG tablet Take 10 mg by mouth at bedtime.    Marland Kitchen QUEtiapine (SEROQUEL) 50 MG tablet Take  2 at night    . rifaximin (XIFAXAN) 550 MG TABS tablet Take 550 mg by mouth 2 (two) times daily.    . sertraline (ZOLOFT) 100 MG tablet Take 1 tab twice a day    . sodium chloride 1 g tablet Take 1 tablet (1 g total) by mouth 2 (two) times daily with a meal. 60 tablet 0  . spironolactone (ALDACTONE) 50 MG tablet Take 1 tablet (50 mg total) by mouth 2 (two) times daily. 60 tablet 0  . temazepam (RESTORIL) 30 MG capsule Take 30 mg by mouth at bedtime as needed.    . thiamine 100 MG tablet Take by mouth.    . topiramate (TOPAMAX) 50 MG tablet Take 50 mg by mouth at bedtime.    . traMADol (ULTRAM) 50 MG tablet TAKE 2 TABLETS BY MOUTH TWICE DAILY FOR BACK PAIN 120 tablet 1   No facility-administered medications prior to visit.    Allergies  Allergen Reactions  . Cholestyramine     Rash and itching  . Trintellix [Vortioxetine]     Review of Systems  Constitutional: Positive for appetite change (poor appetite) and fatigue. Negative for fever.  HENT: Positive for congestion, ear pain (left ), postnasal drip, rhinorrhea, sinus pressure, sinus pain and sore throat.   Eyes: Negative for pain.  Respiratory: Positive for cough. Negative for chest tightness, shortness of breath and wheezing.   Cardiovascular: Negative for chest pain and palpitations.  Gastrointestinal: Positive for nausea and vomiting. Negative for abdominal pain, constipation and diarrhea.  Genitourinary: Negative for dysuria and hematuria.  Musculoskeletal: Positive for myalgias (body aches). Negative for arthralgias, back pain and joint swelling.  Skin: Negative for rash.  Allergic/Immunologic: Positive for environmental allergies.  Neurological: Negative for dizziness, weakness and headaches.   Psychiatric/Behavioral: Negative for dysphoric mood. The patient is not nervous/anxious.        Objective:    Physical Exam Constitutional:      Appearance: Normal appearance.  HENT:     Right Ear: Tympanic membrane is erythematous.     Left Ear: Tympanic membrane is erythematous.     Nose: Congestion and rhinorrhea present.     Right Turbinates: Swollen.     Left Turbinates: Swollen.     Right Sinus: Maxillary sinus tenderness present.     Left Sinus: Maxillary sinus tenderness present.  Mouth/Throat:     Mouth: Mucous membranes are dry.     Pharynx: Posterior oropharyngeal erythema present.  Cardiovascular:     Rate and Rhythm: Normal rate and regular rhythm.     Pulses: Normal pulses.     Heart sounds: Normal heart sounds.  Pulmonary:     Effort: Pulmonary effort is normal.     Breath sounds: Normal breath sounds.  Abdominal:     General: Bowel sounds are normal.     Palpations: Abdomen is soft.  Musculoskeletal:        General: Normal range of motion.     Cervical back: Normal range of motion.  Skin:    General: Skin is warm and dry.     Capillary Refill: Capillary refill takes less than 2 seconds.  Neurological:     General: No focal deficit present.     Mental Status: She is alert and oriented to person, place, and time.  Psychiatric:        Mood and Affect: Mood normal.        Behavior: Behavior normal.     Ht 5' 5"  (1.651 m)   BMI 25.63 kg/m  Wt Readings from Last 3 Encounters:  11/21/20 154 lb (69.9 kg)  11/01/20 160 lb (72.6 kg)  10/03/20 145 lb (65.8 kg)    Health Maintenance Due  Topic Date Due  . HIV Screening  Never done  . Hepatitis C Screening  Never done  . PAP SMEAR-Modifier  Never done  . MAMMOGRAM  Never done  . Zoster Vaccines- Shingrix (1 of 2) Never done  . COVID-19 Vaccine (4 - Booster for Pfizer series) 06/08/2020    Lab Results  Component Value Date   TSH 2.050 04/16/2020   Lab Results  Component Value Date   WBC  13.6 (H) 11/25/2020   HGB 9.4 (L) 11/25/2020   HCT 29.4 (L) 11/25/2020   MCV 100 (H) 11/25/2020   PLT 374 11/25/2020   Lab Results  Component Value Date   NA 135 11/25/2020   K 4.3 11/25/2020   CO2 24 11/25/2020   GLUCOSE 66 11/25/2020   BUN 42 (H) 11/25/2020   CREATININE 1.37 (H) 11/25/2020   BILITOT <0.2 11/25/2020   ALKPHOS 128 (H) 11/25/2020   AST 16 11/25/2020   ALT 14 11/25/2020   PROT 5.7 (L) 11/25/2020   ALBUMIN 3.0 (L) 11/25/2020   CALCIUM 9.7 11/25/2020   EGFR 45 (L) 11/25/2020   Lab Results  Component Value Date   CHOL 137 05/08/2020   Lab Results  Component Value Date   HDL 93 05/08/2020   Lab Results  Component Value Date   LDLCALC 32 05/08/2020   Lab Results  Component Value Date   TRIG 55 05/08/2020   Lab Results  Component Value Date   CHOLHDL 1.5 05/08/2020          Assessment & Plan:   1. Other sinusitis, unspecified chronicity - amoxicillin (AMOXIL) 875 MG tablet; Take 1 tablet (875 mg total) by mouth 2 (two) times daily for 7 days.  Dispense: 14 tablet; Refill: 0  2. Non-seasonal allergic rhinitis due to other allergic trigger - fluticasone (FLONASE) 50 MCG/ACT nasal spray; Place 2 sprays into both nostrils daily.  Dispense: 16 g; Refill: 6  3. Non-intractable vomiting with nausea, unspecified vomiting type - ondansetron (ZOFRAN ODT) 4 MG disintegrating tablet; Take 1 tablet (4 mg total) by mouth every 8 (eight) hours as needed for nausea or vomiting.  Dispense: 20  tablet; Refill: 0  4. Cough - POC COVID-19 BinaxNow-negative - POCT Influenza A/B-neagtive  5. Body aches - POCT rapid strep A-negative     Rest and push fluids May take Delsym cough syrup over the counter Zofran 4 mg ODT for nausea and vomiting Follow-up as needed    Follow-up: As needed  Signed, Jerrell Belfast, DNP

## 2020-12-13 ENCOUNTER — Telehealth: Payer: Self-pay

## 2020-12-13 ENCOUNTER — Other Ambulatory Visit: Payer: Self-pay | Admitting: Family Medicine

## 2020-12-13 DIAGNOSIS — K140 Glossitis: Secondary | ICD-10-CM

## 2020-12-13 NOTE — Telephone Encounter (Signed)
Pt calling asking for medication for tongue. States it is red and tender. Hurts to drink. States she has been dealing with this off/on for over a year. States she has had to use mouthwash for it. States   Pt last had magic mouthwash sent 3/25 d/c on 5/5. Also had nystatin sent 3/17 d/c on 5/5.   Please advise.   Royce Macadamia, [place] 12/13/20 10:16 AM

## 2020-12-18 ENCOUNTER — Ambulatory Visit (INDEPENDENT_AMBULATORY_CARE_PROVIDER_SITE_OTHER): Payer: 59 | Admitting: Family Medicine

## 2020-12-18 ENCOUNTER — Other Ambulatory Visit: Payer: Self-pay

## 2020-12-18 VITALS — BP 110/60 | HR 96 | Temp 98.2°F | Resp 20 | Ht 65.0 in | Wt 163.0 lb

## 2020-12-18 DIAGNOSIS — R3589 Other polyuria: Secondary | ICD-10-CM

## 2020-12-18 DIAGNOSIS — N3000 Acute cystitis without hematuria: Secondary | ICD-10-CM

## 2020-12-18 DIAGNOSIS — R531 Weakness: Secondary | ICD-10-CM | POA: Diagnosis not present

## 2020-12-18 DIAGNOSIS — J018 Other acute sinusitis: Secondary | ICD-10-CM | POA: Diagnosis not present

## 2020-12-18 LAB — POCT URINALYSIS DIPSTICK
Bilirubin, UA: NEGATIVE
Blood, UA: NEGATIVE
Glucose, UA: NEGATIVE
Ketones, UA: NEGATIVE
Nitrite, UA: NEGATIVE
Protein, UA: POSITIVE — AB
Spec Grav, UA: 1.015 (ref 1.010–1.025)
Urobilinogen, UA: 0.2 E.U./dL
pH, UA: 6.5 (ref 5.0–8.0)

## 2020-12-18 MED ORDER — AZITHROMYCIN 250 MG PO TABS: ORAL_TABLET | ORAL | 0 refills | Status: DC

## 2020-12-18 NOTE — Progress Notes (Signed)
Cardiology Office Note:    Date:  12/19/2020   ID:  Molly Parrish, Molly Parrish 1965/08/21, MRN [id number]  PCP:  Rochel Brome, MD  Cardiologist:  Shirlee More, MD   Referring MD: [name], PA-C  ASSESSMENT:    1. Bilateral lower extremity edema   2. Elevated brain natriuretic peptide (BNP) level   3. Alcoholic cirrhosis of liver with ascites ([place])   4. Portal hypertension (HCC)   5. Other specified hypotension    PLAN:    In order of problems listed above:  1. She has multiple potential etiologies of her shortness of breath edema and elevated proBNP level including her liver disease low protein anemia but also has developed shortness of breath since her last echocardiogram and I think she needs to have it repeated in order to define her filling pressures although clinically I do not feel that she has pulmonary artery hypertension from portal hypertension or has diastolic heart failure.  To alleviate edema continue her current loop and distal diuretic.  BNP level is described as being elevators and severe liver disease with fluid overload and in this case is not a specific marker of heart failure I have enclosed a article from the medical literature 2. Stable liver disease followed by GI in [place] Dr. Dallas Breeding 3. Stable hypotension she is on midodrine and I would continue the same.   Hepatogastroenterology . Jan-Feb 2009;56(89):181-5. Brain natriuretic peptide in decompensation of liver cirrhosis in non-cardiac patients Hassell Done Radvan 1, Geanie Berlin, Ashok Norris Radvanov, Marlyce [facility] expand PMID: [id number] Abstract Background/aims: Brain natriuretic peptide is recently widely used as a diagnostic and prognostic marker of heart failure. Plasma levels of this peptide are elevated in other, non-cardiac conditions as well, among others in liver cirrhosis, especially presenting with fluid retention and,--ascites.  Methods: Circulating levels of BNP was determined in 25  non-cardiac patients, 20 men, 5 women admitted at our medicine department from March 2006 to September 2007 with decompensate ascitic liver cirrhosis. Severity of disease was measured by Child and MELD (Model of End Stage of Liver Disease) score.  Results: Plasma BNP was increased in our patients (range 21-1078 pg/ml) and significantly correlated with the severity of liver failure assigned as Child's classification (r = 0.51; p = 0.009) and MELD score (r = 0.56; p = 0.003) as well as with the glomerular filtration rate (r = -0.62; p = 0.0009). Four patients with initial BNP > 600 ng/L died, while all 21 patients with BNP < 600 ng/L survived (p = 0.0019).  Conclusion: B-type natriuretic peptide in plasma correlated significantly with the severity of liver disease in cirrhotic patients. High plasma BNP seems to be a good negative prognostic factor of the death in cirrhosis.   Next appointment 3 months or sooner depending on the results of her echocardiogram   Medication Adjustments/Labs and Tests Ordered: Current medicines are reviewed at length with the patient today.  Concerns regarding medicines are outlined above.  No orders of the defined types were placed in this encounter.  No orders of the defined types were placed in this encounter.    Elevated ProBNP and edema  History of Present Illness:    Molly Parrish is a 55 y.o. female who is being seen today for the evaluation of lower extremity edema at the request of [name], [place].  Ref Range & Units 1 mo ago   NT-Pro BNP 0 - 287 pg/mL 2,144High    Component Ref Range & Units 1  mo ago  (11/01/20) 2 mo ago  (10/03/20) 2 mo ago  (09/25/20) 2 mo ago  (09/23/20) 2 mo ago  (09/23/20) 4 mo ago  (08/19/20) 5 mo ago  (07/17/20)  Hemoglobin 11.1 - 15.9 g/dL 8.5Low  8.8Low  8.3Low   9.3Abnormal R  10.7Low  11.2   Hematocrit 34.0 - 46.6 % 26.1Low  26.7Low  25.0Low   28Abnormal R  32.3Low  34.3    Her weight is increased  9 pounds in the last month Weight 163 lb (73.9 kg) 156 lb (70.8 kg) 154 lb (69.9    She was seen [facility] ED 11/11/2020.  Her history is noted to have alcoholic cirrhosis hepatorenal failure papillary thyroid cancer previous bariatric surgery and was seen in the emergency room with complaints of weakness and confusion.  Her proBNP level was elevated at 2144 white count elevated 13 6 creatinine elevated at 1.32 chest x-ray showed minimal right basilar subsegmental atelectasis or infiltrate.  She was discharged with a diagnosis of generalized weakness renal insufficiency and hypercalcemia.  Her EKG showed sinus rhythm and was normal.  Her calcium was 11.4 in the ED hemoglobin was 8.2.  She had an echocardiogram performed [facility] 06/04/2020 showed normal left ventricular function EF 65 to 70% no valvular abnormality was present.  Right ventricular systolic pressure was normal with normal left ventricular diastolic function.  She had an echocardiogram at [facility] 02/05/2020 normal left ventricular function normal diastolic filling for study comments that no left ventricular thrombus is present.  Chest CTA the same day showed no evidence of aortic dissection.    She was also seen by oncology Dr. Ellyn [facility] with a diagnosis of anemia and leukocytosis.  Anemia is felt to be related to her multiple medical problems including chronic renal insufficiency.  Her sister hospice nurse is with her and participates in evaluation decision making. Onset of liver disease 2013 she was encephalopathic and actually palliative and hospice care was considered at that time She continues on lactulose but her encephalopathy is well controlled. She tells me she has portal hypertension cirrhosis but does not have variceal bleeding. She has chronic edema and had been taking spironolactone. In the last 6 to 7 months she has developed much more shortness of breath and edema  after her last echocardiogram.  Its been attributed to her anemia. She is here today because of a proBNP level and concerns of heart failure. She has no underlying lung disease she is a non-smoker. She is short of breath with activities indoors more than activities of daily living but she has no orthopnea or PND. No chest pain palpitation or syncope. She does not cough or wheeze. Past Medical History:  Diagnosis Date  . Alcoholic cirrhosis of liver ([place])   . Anemia   . Bipolar disorder current episode depressed ([place])   . CKD (chronic kidney disease)   . Generalized anxiety disorder   . GERD (gastroesophageal reflux disease)   . Hypokalemia   . Intestinal malabsorption   . Korsakoff syndrome ([place])   . Other osteoporosis without current pathological fracture   . Other specified hypothyroidism   . Papillary thyroid carcinoma ([place]) 12/10/2015  . Portal hypertension ([place])   . Stroke [facility])     left basal ganglia stroke (hemorrhagic)  . Vitamin D deficiency     Past Surgical History:  Procedure Laterality Date  . CATARACT EXTRACTION    . GASTRIC BYPASS  2005  . HEMORRHOID  SURGERY    . HERNIA REPAIR    . metatarsus abductus surgery Left 1990  . PARTIAL HYSTERECTOMY  05/1997   BL Ovaries remain. Done for fibroids.  . THYROIDECTOMY  03/2013    Current Medications: Current Meds  Medication Sig  . ALPRAZolam (XANAX) 0.25 MG tablet TAKE 1 TABLET(0.25 MG) BY MOUTH TWICE DAILY AS NEEDED (Patient taking differently: 2 (two) times daily as needed for anxiety.)  . azithromycin (ZITHROMAX) 250 MG tablet 2 DAILY FOR FIRST DAY, THEN DECREASE TO ONE DAILY FOR 4 MORE DAYS.  . famotidine (PEPCID) 40 MG tablet Take 40 mg by mouth at bedtime.  . fluticasone (FLONASE) 50 MCG/ACT nasal spray Place 2 sprays into both nostrils daily.  . furosemide (LASIX) 40 MG tablet TAKE 2 TABLETS BY MOUTH TWICE DAILY  . Iron-FA-B Cmp-C-Biot-Probiotic (FUSION PLUS) CAPS Take by mouth 2 (two) times daily.   .  Iron-Vitamins (GERITOL COMPLETE) TABS Take 1 tablet by mouth daily.  Marland Kitchen lamoTRIgine (LAMICTAL) 200 MG tablet Take 200 mg by mouth at bedtime.  Marland Kitchen levothyroxine (SYNTHROID) 200 MCG tablet Patient takes 1 tablets M-F and 1/2 Sat and Sun  . magnesium oxide (MAG-OX) 400 MG tablet Take 400 mg by mouth daily.  . midodrine (PROAMATINE) 2.5 MG tablet TAKE 1 TABLET(2.5 MG) BY MOUTH THREE TIMES DAILY WITH MEALS  . nystatin (MYCOSTATIN) 100000 UNIT/ML suspension SHAKE LIQUID AND TAKE 5 ML BY MOUTH FOUR TIMES DAILY FOR 7 DAYS  . omeprazole (PRILOSEC) 40 MG capsule Take 1 capsule by mouth at bedtime.  . ondansetron (ZOFRAN ODT) 4 MG disintegrating tablet Take 1 tablet (4 mg total) by mouth every 8 (eight) hours as needed for nausea or vomiting.  . promethazine (PHENERGAN) 25 MG tablet TAKE 1 TABLET BY MOUTH FOUR TIMES DAILY AS NEEDED FOR NAUSEA OR VOMITING  . propranolol (INDERAL) 10 MG tablet Take 10 mg by mouth at bedtime.  Marland Kitchen QUEtiapine (SEROQUEL) 50 MG tablet Take 100 mg by mouth at bedtime.  . rifaximin (XIFAXAN) 550 MG TABS tablet Take 550 mg by mouth 2 (two) times daily.  . sertraline (ZOLOFT) 100 MG tablet Take 100 mg by mouth in the morning and at bedtime.  Marland Kitchen spironolactone (ALDACTONE) 50 MG tablet Take 1 tablet (50 mg total) by mouth 2 (two) times daily.  Marland Kitchen thiamine 100 MG tablet Take 100 mg by mouth daily.  . traMADol (ULTRAM) 50 MG tablet TAKE 2 TABLETS BY MOUTH TWICE DAILY FOR BACK PAIN     Allergies:   Cholestyramine and Trintellix [vortioxetine]   Social History   Socioeconomic History  . Marital status: Married    Spouse name: Not on file  . Number of children: 1  . Years of education: Not on file  . Highest education level: Not on file  Occupational History  . Occupation: Engineer, maintenance (IT)    Comment: Retired.  Tobacco Use  . Smoking status: Never Smoker  . Smokeless tobacco: Never Used  Substance and Sexual Activity  . Alcohol use: Not Currently    Comment: history of alcoholism. sober since  2014.  . Drug use: Never  . Sexual activity: Yes  Other Topics Concern  . Not on file  Social History Narrative   Right handed   Social Determinants of Health   Financial Resource Strain: Not on file  Food Insecurity: Not on file  Transportation Needs: Not on file  Physical Activity: Not on file  Stress: Not on file  Social Connections: Not on file     Family  History: The patient's family history includes Cancer in her mother; Osteoporosis in her mother.  ROS:   ROS Please see the history of present illness.     All other systems reviewed and are negative.  EKGs/Labs/Other Studies Reviewed:    The following studies were reviewed today:   EKG:  EKG is  ordered today.  The ekg ordered today is personally reviewed and demonstrates sinus rhythm normal EKG  Recent Labs: 04/16/2020: TSH 2.050 07/17/2020: Magnesium 1.8 11/01/2020: NT-Pro BNP 2,144 12/18/2020: ALT 17; BUN 25; Creatinine, Ser 0.99; Hemoglobin 9.0; Platelets 290; Potassium 5.2; Sodium 135  Recent Lipid Panel    Component Value Date/Time   CHOL 137 05/08/2020 1049   TRIG 55 05/08/2020 1049   HDL 93 05/08/2020 1049   CHOLHDL 1.5 05/08/2020 1049   LDLCALC 32 05/08/2020 1049    Physical Exam:    VS:  BP 125/80 (BP Location: Right Arm, Patient Position: Sitting, Cuff Size: Normal)   Pulse 92   Ht 5\' 6"  (1.676 m)   Wt 160 lb 12.8 oz (72.9 kg)   SpO2 96%   BMI 25.95 kg/m     Wt Readings from Last 3 Encounters:  12/19/20 160 lb 12.8 oz (72.9 kg)  12/18/20 163 lb (73.9 kg)  12/12/20 156 lb (70.8 kg)     GEN: She looks chronically ill well nourished, well developed in no acute distress HEENT: Normal NECK: No JVD; No carotid bruits LYMPHATICS: No lymphadenopathy CARDIAC: RRR, no murmurs, rubs, gallops RESPIRATORY:  Clear to auscultation without rales, wheezing or rhonchi  ABDOMEN: Soft, non-tender, non-distended MUSCULOSKELETAL: She has bilateral 2-3+ above the knee pitting edema; No deformity  SKIN:  Warm and dry NEUROLOGIC:  Alert and oriented x 3 PSYCHIATRIC:  Normal affect     Signed, Shirlee More, MD  12/19/2020 2:54 PM    [facility]

## 2020-12-18 NOTE — Progress Notes (Signed)
Subjective:  Patient ID: Molly Parrish, female    DOB: 09-25-1965  Age: 55 y.o. MRN: [id number]  Chief Complaint  Patient presents with  . Urinary Tract Infection  . Sinusitis  . Fatigue    HPI Chronic follow-up: Patient was seen for sinusitis last year in our office and was given amoxicillin and Flonase.  She still having some nasal congestion runny nose, sore throat, coughing, and shortness of breath.  She does feel a little better, but not a lot.  Bladder symptoms: Currently having increased urgency but very small volumes of urine.  Denies dysuria.  Denies fever. Fatigue: Patient has ongoing issues with fatigue.  Patient has known chronic kidney disease as well as liver cirrhosis.  She denies alcohol use although she does have a history of alcoholism.  She has a very poor appetite.  This has been going on since her gastric bypass many years ago.  She often will regurgitate her food.  Sometimes has nausea and vomiting.  Patient was given Zofran at her last visit here.  She is concerned about her weight which seems to fluctuate.  She is also having severe itching.  Current Outpatient Medications on File Prior to Visit  Medication Sig Dispense Refill  . ALPRAZolam (XANAX) 0.25 MG tablet TAKE 1 TABLET(0.25 MG) BY MOUTH TWICE DAILY AS NEEDED (Patient taking differently: 2 (two) times daily as needed for anxiety.) 60 tablet 1  . famotidine (PEPCID) 40 MG tablet Take 40 mg by mouth at bedtime.    . fluticasone (FLONASE) 50 MCG/ACT nasal spray Place 2 sprays into both nostrils daily. 16 g 6  . furosemide (LASIX) 40 MG tablet TAKE 2 TABLETS BY MOUTH TWICE DAILY 360 tablet 1  . Iron-FA-B Cmp-C-Biot-Probiotic (FUSION PLUS) CAPS Take by mouth 2 (two) times daily.     . Iron-Vitamins (GERITOL COMPLETE) TABS Take 1 tablet by mouth daily.    Marland Kitchen lamoTRIgine (LAMICTAL) 200 MG tablet Take 200 mg by mouth at bedtime.    Marland Kitchen levothyroxine (SYNTHROID) 200 MCG tablet Patient takes 1 tablets M-F and 1/2 Sat  and Sun    . magnesium oxide (MAG-OX) 400 MG tablet Take 400 mg by mouth daily.    . midodrine (PROAMATINE) 2.5 MG tablet TAKE 1 TABLET(2.5 MG) BY MOUTH THREE TIMES DAILY WITH MEALS 90 tablet 0  . nystatin (MYCOSTATIN) 100000 UNIT/ML suspension SHAKE LIQUID AND TAKE 5 ML BY MOUTH FOUR TIMES DAILY FOR 7 DAYS 473 mL 1  . omeprazole (PRILOSEC) 40 MG capsule Take 1 capsule by mouth at bedtime.    . ondansetron (ZOFRAN ODT) 4 MG disintegrating tablet Take 1 tablet (4 mg total) by mouth every 8 (eight) hours as needed for nausea or vomiting. 20 tablet 0  . promethazine (PHENERGAN) 25 MG tablet TAKE 1 TABLET BY MOUTH FOUR TIMES DAILY AS NEEDED FOR NAUSEA OR VOMITING 40 tablet 2  . propranolol (INDERAL) 10 MG tablet Take 10 mg by mouth at bedtime.    Marland Kitchen QUEtiapine (SEROQUEL) 50 MG tablet Take 100 mg by mouth at bedtime.    . rifaximin (XIFAXAN) 550 MG TABS tablet Take 550 mg by mouth 2 (two) times daily.    . sertraline (ZOLOFT) 100 MG tablet Take 100 mg by mouth in the morning and at bedtime.    Marland Kitchen spironolactone (ALDACTONE) 50 MG tablet Take 1 tablet (50 mg total) by mouth 2 (two) times daily. 60 tablet 0  . thiamine 100 MG tablet Take 100 mg by mouth daily.    Marland Kitchen  traMADol (ULTRAM) 50 MG tablet TAKE 2 TABLETS BY MOUTH TWICE DAILY FOR BACK PAIN 120 tablet 1   No current facility-administered medications on file prior to visit.   Past Medical History:  Diagnosis Date  . Alcoholic cirrhosis of liver ([place])   . Anemia   . Bipolar disorder current episode depressed ([place])   . CKD (chronic kidney disease)   . Generalized anxiety disorder   . GERD (gastroesophageal reflux disease)   . Hypokalemia   . Intestinal malabsorption   . Korsakoff syndrome ([place])   . Other osteoporosis without current pathological fracture   . Other specified hypothyroidism   . Papillary thyroid carcinoma ([place]) 12/10/2015  . Portal hypertension ([place])   . Stroke [facility])     left basal ganglia stroke (hemorrhagic)  . Vitamin D  deficiency    Past Surgical History:  Procedure Laterality Date  . CATARACT EXTRACTION    . GASTRIC BYPASS  2005  . HEMORRHOID SURGERY    . HERNIA REPAIR    . metatarsus abductus surgery Left 1990  . PARTIAL HYSTERECTOMY  05/1997   BL Ovaries remain. Done for fibroids.  . THYROIDECTOMY  03/2013    Family History  Problem Relation Age of Onset  . Osteoporosis Mother   . Cancer Mother        Breast, lung, skin.    Social History   Socioeconomic History  . Marital status: Married    Spouse name: Not on file  . Number of children: 1  . Years of education: Not on file  . Highest education level: Not on file  Occupational History  . Occupation: Engineer, maintenance (IT)    Comment: Retired.  Tobacco Use  . Smoking status: Never Smoker  . Smokeless tobacco: Never Used  Substance and Sexual Activity  . Alcohol use: Not Currently    Comment: history of alcoholism. sober since 2014.  . Drug use: Never  . Sexual activity: Yes  Other Topics Concern  . Not on file  Social History Narrative   Right handed   Social Determinants of Health   Financial Resource Strain: Not on file  Food Insecurity: Not on file  Transportation Needs: Not on file  Physical Activity: Not on file  Stress: Not on file  Social Connections: Not on file    Review of Systems  Constitutional: Positive for fatigue and unexpected weight change. Negative for chills and fever.  HENT: Positive for congestion, rhinorrhea and sore throat.   Respiratory: Positive for cough and shortness of breath.   Cardiovascular: Negative for chest pain.  Gastrointestinal: Positive for nausea and vomiting. Negative for abdominal pain and constipation.  Endocrine: Positive for polydipsia.  Genitourinary: Negative for dysuria and urgency.  Musculoskeletal: Positive for back pain. Negative for myalgias.  Skin: Positive for rash.       Itching   Neurological: Positive for weakness and headaches. Negative for dizziness and light-headedness.   Psychiatric/Behavioral: Negative for dysphoric mood. The patient is not nervous/anxious.      Objective:  BP 110/60   Pulse 96   Temp 98.2 F (36.8 C)   Resp 20   Ht 5\' 5"  (1.651 m)   Wt 163 lb (73.9 kg)   BMI 27.12 kg/m   BP/Weight 12/19/2020 12/18/2020 10/26/8117  Systolic BP 147 829 562  Diastolic BP 80 60 72  Wt. (Lbs) 160.8 163 156  BMI 25.95 27.12 25.96    Physical Exam Vitals reviewed.  Constitutional:      Appearance: Normal appearance. She is  normal weight.  HENT:     Right Ear: Tympanic membrane normal.     Left Ear: Tympanic membrane normal.     Nose: Congestion present.     Right Sinus: Maxillary sinus tenderness and frontal sinus tenderness present.     Left Sinus: Maxillary sinus tenderness and frontal sinus tenderness present.     Comments: Sinus tenderness    Mouth/Throat:     Mouth: Mucous membranes are moist.     Pharynx: No oropharyngeal exudate or posterior oropharyngeal erythema.  Neck:     Vascular: No carotid bruit.  Cardiovascular:     Rate and Rhythm: Normal rate and regular rhythm.     Pulses: Normal pulses.     Heart sounds: Normal heart sounds.  Pulmonary:     Effort: Pulmonary effort is normal.     Breath sounds: Normal breath sounds.  Abdominal:     General: Bowel sounds are normal.     Palpations: Abdomen is soft.     Tenderness: There is no abdominal tenderness.  Lymphadenopathy:     Cervical: No cervical adenopathy.  Neurological:     Mental Status: She is alert and oriented to person, place, and time.  Psychiatric:        Mood and Affect: Mood normal.        Behavior: Behavior normal.     Diabetic Foot Exam - Simple   No data filed      Lab Results  Component Value Date   WBC 11.4 (H) 12/18/2020   HGB 9.0 (L) 12/18/2020   HCT 28.2 (L) 12/18/2020   PLT 290 12/18/2020   GLUCOSE 73 12/18/2020   CHOL 137 05/08/2020   TRIG 55 05/08/2020   HDL 93 05/08/2020   LDLCALC 32 05/08/2020   ALT 17 12/18/2020   AST 21  12/18/2020   NA 135 12/18/2020   K 5.2 12/18/2020   CL 96 12/18/2020   CREATININE 0.99 12/18/2020   BUN 25 (H) 12/18/2020   CO2 24 12/18/2020   TSH 2.050 04/16/2020      Assessment & Plan:   1. Polyuria - Urine Culture - POCT urinalysis dipstick  2. Weakness Labs drawn today: - CBC with Differential/Platelet - Comprehensive metabolic panel  3. Other sinusitis, unspecified chronicity - azithromycin (ZITHROMAX) 250 MG tablet   Sig: 2 DAILY FOR FIRST DAY, THEN DECREASE TO ONE DAILY FOR      4 MORE DAYS. Stop amoxicillin   Meds ordered this encounter  Medications  . azithromycin (ZITHROMAX) 250 MG tablet    Sig: 2 DAILY FOR FIRST DAY, THEN DECREASE TO ONE DAILY FOR 4 MORE DAYS.    Dispense:  6 tablet    Refill:  0    Orders Placed This Encounter  Procedures  . Urine Culture  . CBC with Differential/Platelet  . Comprehensive metabolic panel  . POCT urinalysis dipstick    I,Marla I Leal-Borjas,acting as a scribe for Rochel Brome, MD.,have documented all relevant documentation on the behalf of Rochel Brome, MD,as directed by  Rochel Brome, MD while in the presence of Rochel Brome, MD.   Follow-up: No follow-ups on file.  An After Visit Summary was printed and given to the patient.  Rochel Brome, MD [facility] [phone]

## 2020-12-19 ENCOUNTER — Encounter: Payer: Self-pay | Admitting: Cardiology

## 2020-12-19 ENCOUNTER — Ambulatory Visit (INDEPENDENT_AMBULATORY_CARE_PROVIDER_SITE_OTHER): Payer: 59 | Admitting: Cardiology

## 2020-12-19 ENCOUNTER — Other Ambulatory Visit: Payer: Self-pay

## 2020-12-19 VITALS — BP 125/80 | HR 92 | Ht 66.0 in | Wt 160.8 lb

## 2020-12-19 DIAGNOSIS — R600 Localized edema: Secondary | ICD-10-CM | POA: Diagnosis not present

## 2020-12-19 DIAGNOSIS — I9589 Other hypotension: Secondary | ICD-10-CM

## 2020-12-19 DIAGNOSIS — R7989 Other specified abnormal findings of blood chemistry: Secondary | ICD-10-CM | POA: Diagnosis not present

## 2020-12-19 DIAGNOSIS — K7031 Alcoholic cirrhosis of liver with ascites: Secondary | ICD-10-CM | POA: Diagnosis not present

## 2020-12-19 DIAGNOSIS — K766 Portal hypertension: Secondary | ICD-10-CM

## 2020-12-19 LAB — CBC WITH DIFFERENTIAL/PLATELET
Basophils Absolute: 0.1 10*3/uL (ref 0.0–0.2)
Basos: 1 %
EOS (ABSOLUTE): 0.2 10*3/uL (ref 0.0–0.4)
Eos: 2 %
Hematocrit: 28.2 % — ABNORMAL LOW (ref 34.0–46.6)
Hemoglobin: 9 g/dL — ABNORMAL LOW (ref 11.1–15.9)
Immature Grans (Abs): 0.1 10*3/uL (ref 0.0–0.1)
Immature Granulocytes: 1 %
Lymphocytes Absolute: 1.2 10*3/uL (ref 0.7–3.1)
Lymphs: 11 %
MCH: 30.3 pg (ref 26.6–33.0)
MCHC: 31.9 g/dL (ref 31.5–35.7)
MCV: 95 fL (ref 79–97)
Monocytes Absolute: 0.9 10*3/uL (ref 0.1–0.9)
Monocytes: 8 %
Neutrophils Absolute: 9 10*3/uL — ABNORMAL HIGH (ref 1.4–7.0)
Neutrophils: 77 %
Platelets: 290 10*3/uL (ref 150–450)
RBC: 2.97 x10E6/uL — ABNORMAL LOW (ref 3.77–5.28)
RDW: 13.5 % (ref 11.7–15.4)
WBC: 11.4 10*3/uL — ABNORMAL HIGH (ref 3.4–10.8)

## 2020-12-19 LAB — COMPREHENSIVE METABOLIC PANEL
ALT: 17 IU/L (ref 0–32)
AST: 21 IU/L (ref 0–40)
Albumin/Globulin Ratio: 1.4 (ref 1.2–2.2)
Albumin: 3.3 g/dL — ABNORMAL LOW (ref 3.8–4.9)
Alkaline Phosphatase: 152 IU/L — ABNORMAL HIGH (ref 44–121)
BUN/Creatinine Ratio: 25 — ABNORMAL HIGH (ref 9–23)
BUN: 25 mg/dL — ABNORMAL HIGH (ref 6–24)
Bilirubin Total: <0.2 mg/dL (ref 0.0–1.2)
CO2: 24 mmol/L (ref 20–29)
Calcium: 7 mg/dL — ABNORMAL LOW (ref 8.7–10.2)
Chloride: 96 mmol/L (ref 96–106)
Creatinine, Ser: 0.99 mg/dL (ref 0.57–1.00)
Globulin, Total: 2.3 g/dL (ref 1.5–4.5)
Glucose: 73 mg/dL (ref 65–99)
Potassium: 5.2 mmol/L (ref 3.5–5.2)
Sodium: 135 mmol/L (ref 134–144)
Total Protein: 5.6 g/dL — ABNORMAL LOW (ref 6.0–8.5)
eGFR: 67 mL/min/{1.73_m2} (ref >59)

## 2020-12-19 NOTE — Patient Instructions (Addendum)
Medication Instructions:  Your physician recommends that you continue on your current medications as directed. Please refer to the Current Medication list given to you today.  *If you need a refill on your cardiac medications before your next appointment, please call your pharmacy*   Lab Work: None If you have labs (blood work) drawn today and your tests are completely normal, you will receive your results only by: Marland Kitchen MyChart Message (if you have MyChart) OR . A paper copy in the mail If you have any lab test that is abnormal or we need to change your treatment, we will call you to review the results.   Testing/Procedures: Your physician has requested that you have an echocardiogram. Echocardiography is a painless test that uses sound waves to create images of your heart. It provides your doctor with information about the size and shape of your heart and how well your heart's chambers and valves are working. This procedure takes approximately one hour. There are no restrictions for this procedure.     Follow-Up: At [facility], you and your health needs are our priority.  As part of our continuing mission to provide you with exceptional heart care, we have created designated Provider Care Teams.  These Care Teams include your primary Cardiologist (physician) and Advanced Practice Providers (APPs -  Physician Assistants and Nurse Practitioners) who all work together to provide you with the care you need, when you need it.  We recommend signing up for the patient portal called "MyChart".  Sign up information is provided on this After Visit Summary.  MyChart is used to connect with patients for Virtual Visits (Telemedicine).  Patients are able to view lab/test results, encounter notes, upcoming appointments, etc.  Non-urgent messages can be sent to your provider as well.   To learn more about what you can do with MyChart, go to [email or web address].    Your next appointment:   3  month(s)  The format for your next appointment:   In Person  Provider:   Shirlee More, MD   Other Instructions

## 2020-12-20 ENCOUNTER — Telehealth: Payer: Self-pay

## 2020-12-20 LAB — URINE CULTURE: Organism ID, Bacteria: NO GROWTH

## 2020-12-20 NOTE — Telephone Encounter (Signed)
Patient notified of lab results

## 2020-12-24 ENCOUNTER — Inpatient Hospital Stay: Payer: 59 | Admitting: Oncology

## 2020-12-24 ENCOUNTER — Inpatient Hospital Stay: Payer: 59 | Attending: Oncology

## 2020-12-24 ENCOUNTER — Encounter: Payer: Self-pay | Admitting: Family Medicine

## 2020-12-24 DIAGNOSIS — D72829 Elevated white blood cell count, unspecified: Secondary | ICD-10-CM | POA: Insufficient documentation

## 2020-12-24 DIAGNOSIS — D649 Anemia, unspecified: Secondary | ICD-10-CM | POA: Insufficient documentation

## 2020-12-26 NOTE — Progress Notes (Signed)
[place]  [address] [place],    [address] [phone]  Clinic Day:  12/30/2020  Referring physician: Rochel Brome, MD  This document serves as a record of services personally performed by Marice Potter, MD. It was created on their behalf by [facility] E, a trained medical scribe. The creation of this record is based on the scribe's personal observations and the provider's statements to them.  HISTORY OF PRESENT ILLNESS:  The patient is a 55 y.o. female who I recently began seeing for leukocytosis and anemia.  She comes in today to reassess her peripheral counts.  Since her last visit, she has been doing fairly well.  She denies having increased fatigue or any overt forms of blood loss to explain her anemia.  She denies having recent infections or other issues that could be exacerbating her leukocytosis.  PHYSICAL EXAM:  Blood pressure (!) 114/58, pulse 94, temperature 97.6 F (36.4 C), resp. rate 16, height 5\' 6"  (1.676 m), weight 164 lb 6.4 oz (74.6 kg), SpO2 96 %. Wt Readings from Last 3 Encounters:  12/30/20 164 lb 6.4 oz (74.6 kg)  12/19/20 160 lb 12.8 oz (72.9 kg)  12/18/20 163 lb (73.9 kg)   Body mass index is 26.53 kg/m. Performance status (ECOG): 2 - Symptomatic, <50% confined to bed Physical Exam Constitutional:      Appearance: Normal appearance. She is ill-appearing (chronically ill appearance; she looks better vs last visit).  HENT:     Mouth/Throat:     Mouth: Mucous membranes are moist.     Pharynx: Oropharynx is clear. No oropharyngeal exudate or posterior oropharyngeal erythema.  Cardiovascular:     Rate and Rhythm: Normal rate and regular rhythm.     Heart sounds: No murmur heard.   No friction rub. No gallop.  Pulmonary:     Effort: Pulmonary effort is normal. No respiratory distress.     Breath sounds: Normal breath sounds. No wheezing, rhonchi or rales.  Chest:  Breasts:    Right: No axillary adenopathy  or supraclavicular adenopathy.     Left: No axillary adenopathy or supraclavicular adenopathy.  Abdominal:     General: Bowel sounds are normal. There is no distension.     Palpations: Abdomen is soft. There is no mass.     Tenderness: There is no abdominal tenderness.  Musculoskeletal:        General: No swelling.     Right lower leg: No edema.     Left lower leg: No edema.  Lymphadenopathy:     Cervical: No cervical adenopathy.     Upper Body:     Right upper body: No supraclavicular or axillary adenopathy.     Left upper body: No supraclavicular or axillary adenopathy.     Lower Body: No right inguinal adenopathy. No left inguinal adenopathy.  Skin:    General: Skin is warm.     Coloration: Skin is not jaundiced.     Findings: No lesion or rash.  Neurological:     General: No focal deficit present.     Mental Status: She is alert and oriented to person, place, and time. Mental status is at baseline.     Cranial Nerves: Cranial nerves are intact.  Psychiatric:        Mood and Affect: Mood normal.        Behavior: Behavior normal.        Thought Content: Thought content normal.    LABS:   CBC  Latest Ref Rng & Units 12/30/2020 12/18/2020 11/25/2020  WBC - 10.6 11.4(H) 13.6(H)  Hemoglobin 12.0 - 16.0 9.7(A) 9.0(L) 9.4(L)  Hematocrit 36 - 46 29(A) 28.2(L) 29.4(L)  Platelets 150 - 399 355 290 374   CMP Latest Ref Rng & Units 12/30/2020 12/18/2020 11/25/2020  Glucose 65 - 99 mg/dL - 73 66  BUN 4 - 21 43(A) 25(H) 42(H)  Creatinine 0.5 - 1.1 1.1 0.99 1.37(H)  Sodium 137 - 147 132(A) 135 135  Potassium 3.4 - 5.3 4.1 5.2 4.3  Chloride 99 - 108 96(A) 96 95(L)  CO2 13 - 22 26(A) 24 24  Calcium 8.7 - 10.7 7.0(A) 7.0(L) 9.7  Total Protein 6.0 - 8.5 g/dL - 5.6(L) 5.7(L)  Total Bilirubin 0.0 - 1.2 mg/dL - <0.2 <0.2  Alkaline Phos 25 - 125 129(A) 152(H) 128(H)  AST 13 - 35 28 21 16   ALT 7 - 35 17 17 14     Ref. Range 12/30/2020 10:56  Iron Latest Ref Range: 28 - 170 ug/dL 45  UIBC Latest  Units: ug/dL 238  TIBC Latest Ref Range: 250 - 450 ug/dL 283  Saturation Ratios Latest Ref Range: 10.4 - 31.8 % 16  Ferritin Latest Ref Range: 11 - 307 ng/mL 512 (H)   ASSESSMENT & PLAN:  Assessment/Plan:  A 55 y.o. female with leukocytosis and anemia.  I am pleased as her white count is better.  It does not appear she has had any recent infections or other problems, which is likely why her white count is normal.  In the past Retacrit shots were discussed for her anemia, particularly as her kidney parameters were suboptimal.   Over time, her creatinine has improved.  Her BUN is elevated, likely due to her diuretic therapy giving her a prerenal azotemic picture.  Although her MCV has fallen over consecutive months, her iron parameters are fine.  Overall, she clearly appears to be doing better.  I will see her back in 3 months for repeat clinical assessment.  The patient understands all the plans discussed today and is in agreement with them.     I, Rita Ohara, am acting as scribe for Marice Potter, MD    I have reviewed this report as typed by the medical scribe, and it is complete and accurate.  Dequincy Macarthur Critchley, MD

## 2020-12-30 ENCOUNTER — Encounter: Payer: Self-pay | Admitting: Oncology

## 2020-12-30 ENCOUNTER — Inpatient Hospital Stay: Payer: 59

## 2020-12-30 ENCOUNTER — Other Ambulatory Visit: Payer: Self-pay | Admitting: Oncology

## 2020-12-30 ENCOUNTER — Telehealth: Payer: Self-pay | Admitting: Oncology

## 2020-12-30 ENCOUNTER — Inpatient Hospital Stay (HOSPITAL_BASED_OUTPATIENT_CLINIC_OR_DEPARTMENT_OTHER): Payer: 59 | Admitting: Oncology

## 2020-12-30 VITALS — BP 114/58 | HR 94 | Temp 97.6°F | Resp 16 | Ht 66.0 in | Wt 164.4 lb

## 2020-12-30 DIAGNOSIS — D649 Anemia, unspecified: Secondary | ICD-10-CM | POA: Diagnosis present

## 2020-12-30 DIAGNOSIS — D72829 Elevated white blood cell count, unspecified: Secondary | ICD-10-CM | POA: Diagnosis present

## 2020-12-30 DIAGNOSIS — D638 Anemia in other chronic diseases classified elsewhere: Secondary | ICD-10-CM

## 2020-12-30 DIAGNOSIS — D500 Iron deficiency anemia secondary to blood loss (chronic): Secondary | ICD-10-CM | POA: Diagnosis not present

## 2020-12-30 LAB — BASIC METABOLIC PANEL
BUN: 43 — AB (ref 4–21)
CO2: 26 — AB (ref 13–22)
Chloride: 96 — AB (ref 99–108)
Creatinine: 1.1 (ref 0.5–1.1)
Glucose: 108
Potassium: 4.1 (ref 3.4–5.3)
Sodium: 132 — AB (ref 137–147)

## 2020-12-30 LAB — CBC: RBC: 3.18 — AB (ref 3.87–5.11)

## 2020-12-30 LAB — CBC AND DIFFERENTIAL
HCT: 29 — AB (ref 36–46)
Hemoglobin: 9.7 — AB (ref 12.0–16.0)
Neutrophils Absolute: 8.06
Platelets: 355 (ref 150–399)
WBC: 10.6

## 2020-12-30 LAB — COMPREHENSIVE METABOLIC PANEL
Albumin: 3.6 (ref 3.5–5.0)
Calcium: 7 — AB (ref 8.7–10.7)

## 2020-12-30 LAB — IRON AND TIBC
Iron: 45 ug/dL (ref 28–170)
Saturation Ratios: 16 % (ref 10.4–31.8)
TIBC: 283 ug/dL (ref 250–450)
UIBC: 238 ug/dL

## 2020-12-30 LAB — FERRITIN: Ferritin: 512 ng/mL — ABNORMAL HIGH (ref 11–307)

## 2020-12-30 LAB — HEPATIC FUNCTION PANEL
ALT: 17 (ref 7–35)
AST: 28 (ref 13–35)
Alkaline Phosphatase: 129 — AB (ref 25–125)
Bilirubin, Total: 0.2

## 2020-12-30 NOTE — Telephone Encounter (Signed)
Per 6/13 LOS, patient scheduled for Sept Appt's.  Gave patient Appt Summary

## 2020-12-31 ENCOUNTER — Ambulatory Visit: Payer: Medicare Other | Admitting: Family Medicine

## 2021-01-01 ENCOUNTER — Encounter: Payer: Self-pay | Admitting: Family Medicine

## 2021-01-01 ENCOUNTER — Other Ambulatory Visit: Payer: Self-pay | Admitting: Family Medicine

## 2021-01-02 ENCOUNTER — Other Ambulatory Visit: Payer: Self-pay | Admitting: Legal Medicine

## 2021-01-02 MED ORDER — HYDROXYZINE PAMOATE 25 MG PO CAPS: 25.0000 mg | ORAL_CAPSULE | Freq: Three times a day (TID) | ORAL | 0 refills | Status: DC | PRN

## 2021-01-04 ENCOUNTER — Other Ambulatory Visit: Payer: Self-pay | Admitting: Family Medicine

## 2021-01-06 ENCOUNTER — Encounter: Payer: Self-pay | Admitting: Family Medicine

## 2021-01-13 ENCOUNTER — Other Ambulatory Visit: Payer: Self-pay | Admitting: Family Medicine

## 2021-01-13 DIAGNOSIS — E871 Hypo-osmolality and hyponatremia: Secondary | ICD-10-CM

## 2021-01-13 DIAGNOSIS — D638 Anemia in other chronic diseases classified elsewhere: Secondary | ICD-10-CM

## 2021-01-23 ENCOUNTER — Other Ambulatory Visit: Payer: Medicare Other

## 2021-01-26 ENCOUNTER — Other Ambulatory Visit: Payer: Self-pay | Admitting: Family Medicine

## 2021-01-26 ENCOUNTER — Encounter: Payer: Self-pay | Admitting: Family Medicine

## 2021-02-04 ENCOUNTER — Other Ambulatory Visit: Payer: Medicare Other

## 2021-02-11 ENCOUNTER — Other Ambulatory Visit: Payer: Self-pay | Admitting: Legal Medicine

## 2021-02-25 ENCOUNTER — Other Ambulatory Visit: Payer: Self-pay | Admitting: Legal Medicine

## 2021-03-04 ENCOUNTER — Ambulatory Visit (INDEPENDENT_AMBULATORY_CARE_PROVIDER_SITE_OTHER): Payer: 59

## 2021-03-04 ENCOUNTER — Other Ambulatory Visit: Payer: Self-pay

## 2021-03-04 DIAGNOSIS — R600 Localized edema: Secondary | ICD-10-CM | POA: Diagnosis not present

## 2021-03-04 DIAGNOSIS — R7989 Other specified abnormal findings of blood chemistry: Secondary | ICD-10-CM | POA: Diagnosis not present

## 2021-03-04 DIAGNOSIS — K7031 Alcoholic cirrhosis of liver with ascites: Secondary | ICD-10-CM | POA: Diagnosis not present

## 2021-03-04 LAB — ECHOCARDIOGRAM COMPLETE
Area-P 1/2: 5.88 cm2
S' Lateral: 3.5 cm

## 2021-03-05 ENCOUNTER — Telehealth: Payer: Self-pay

## 2021-03-05 NOTE — Telephone Encounter (Signed)
-----   Message from Berniece Salines, DO sent at 03/05/2021  8:36 AM EDT ----- This is Dr. Joya Gaskins patient: The echo showed that the heart is not fully relaxing like it should ( diastolic dysfunction) , there is slightly elevated pressures on the right side of the heart and small amount of leakage in the mitral valve.  Right now there is no further intervention during his visit with Dr. Bettina Gavia it will be discussed in details.

## 2021-03-05 NOTE — Telephone Encounter (Signed)
Spoke with patient regarding results and recommendation.  Patient verbalizes understanding and is agreeable to plan of care. Advised patient to call back with any issues or concerns.  

## 2021-03-21 ENCOUNTER — Ambulatory Visit (INDEPENDENT_AMBULATORY_CARE_PROVIDER_SITE_OTHER): Payer: 59

## 2021-03-21 ENCOUNTER — Encounter: Payer: Self-pay | Admitting: Family Medicine

## 2021-03-21 DIAGNOSIS — Z23 Encounter for immunization: Secondary | ICD-10-CM

## 2021-03-24 ENCOUNTER — Other Ambulatory Visit: Payer: Self-pay | Admitting: Legal Medicine

## 2021-03-25 ENCOUNTER — Ambulatory Visit (INDEPENDENT_AMBULATORY_CARE_PROVIDER_SITE_OTHER): Payer: 59 | Admitting: Family Medicine

## 2021-03-25 ENCOUNTER — Other Ambulatory Visit: Payer: Self-pay

## 2021-03-25 VITALS — BP 100/56 | HR 80 | Temp 97.8°F | Resp 16 | Ht 66.0 in | Wt 156.0 lb

## 2021-03-25 DIAGNOSIS — E43 Unspecified severe protein-calorie malnutrition: Secondary | ICD-10-CM

## 2021-03-25 DIAGNOSIS — E890 Postprocedural hypothyroidism: Secondary | ICD-10-CM | POA: Diagnosis not present

## 2021-03-25 DIAGNOSIS — K909 Intestinal malabsorption, unspecified: Secondary | ICD-10-CM

## 2021-03-25 DIAGNOSIS — K7031 Alcoholic cirrhosis of liver with ascites: Secondary | ICD-10-CM

## 2021-03-25 DIAGNOSIS — K766 Portal hypertension: Secondary | ICD-10-CM

## 2021-03-25 DIAGNOSIS — L650 Telogen effluvium: Secondary | ICD-10-CM

## 2021-03-25 DIAGNOSIS — F3131 Bipolar disorder, current episode depressed, mild: Secondary | ICD-10-CM

## 2021-03-25 DIAGNOSIS — F1021 Alcohol dependence, in remission: Secondary | ICD-10-CM

## 2021-03-25 DIAGNOSIS — F5101 Primary insomnia: Secondary | ICD-10-CM

## 2021-03-25 DIAGNOSIS — D638 Anemia in other chronic diseases classified elsewhere: Secondary | ICD-10-CM

## 2021-03-25 MED ORDER — MINOXIDIL 5 % EX SOLN: 1.0000 mL | Freq: Two times a day (BID) | CUTANEOUS | 2 refills | Status: DC

## 2021-03-25 MED ORDER — DAYVIGO 5 MG PO TABS: 5.0000 mg | ORAL_TABLET | Freq: Every day | ORAL | 0 refills | Status: DC

## 2021-03-25 NOTE — Progress Notes (Deleted)
Cardiology Office Note:    Date:  03/25/2021   ID:  Molly Parrish, DOB 1966-04-01, MRN [id number]  PCP:  Rochel Brome, MD  Cardiologist:  Shirlee More, MD    Referring MD: Rochel Brome, MD    ASSESSMENT:    No diagnosis found. PLAN:    In order of problems listed above:  ***   Next appointment: ***   Medication Adjustments/Labs and Tests Ordered: Current medicines are reviewed at length with the patient today.  Concerns regarding medicines are outlined above.  No orders of the defined types were placed in this encounter.  No orders of the defined types were placed in this encounter.   No chief complaint on file.   History of Present Illness:    Molly Parrish is a 55 y.o. female with a hx of bilateral lower extremity edema shortness of breath cirrhosis of the liver with ascites portal hypertension and arterial hypotension requiring midodrine support.  She was last seen 01/03/2021.  A literature search showed that BMP is often elevated in patients with cirrhosis especially presenting with volume overload and ascites and correlated with this severity of liver disease.  She was continued on a loop and distal diuretic and midodrine for blood pressure support. Compliance with diet, lifestyle and medications: ***  She underwent further evaluation echocardiogram 03/04/2021.  Left ventricle is normal in size EF 60 to 65% and she had elevated filling pressure the right ventricle is normal in size with mildly elevated pulmonary artery systolic pressure left atrium is moderately dilated she had mild mitral regurgitation. Past Medical History:  Diagnosis Date   Alcoholic cirrhosis of liver ([place])    Anemia    Bipolar disorder current episode depressed ([facility])    CKD (chronic kidney disease)    Generalized anxiety disorder    GERD (gastroesophageal reflux disease)    Hypokalemia    Intestinal malabsorption    Korsakoff syndrome ([place])    Other osteoporosis without current  pathological fracture    Other specified hypothyroidism    Papillary thyroid carcinoma ([place]) 12/10/2015   Portal hypertension ([facility])    Stroke ([place])     left basal ganglia stroke (hemorrhagic)   Vitamin D deficiency     Past Surgical History:  Procedure Laterality Date   CATARACT EXTRACTION     GASTRIC BYPASS  2005   HEMORRHOID SURGERY     HERNIA REPAIR     metatarsus abductus surgery Left 1990   PARTIAL HYSTERECTOMY  05/1997   BL Ovaries remain. Done for fibroids.   THYROIDECTOMY  03/2013    Current Medications: No outpatient medications have been marked as taking for the 03/26/21 encounter (Appointment) with Richardo Priest, MD.     Allergies:   Cholestyramine and Trintellix [vortioxetine]   Social History   Socioeconomic History   Marital status: Married    Spouse name: Not on file   Number of children: 1   Years of education: Not on file   Highest education level: Not on file  Occupational History   Occupation: Engineer, maintenance (IT)    Comment: Retired.  Tobacco Use   Smoking status: Never   Smokeless tobacco: Never  Substance and Sexual Activity   Alcohol use: Not Currently    Comment: history of alcoholism. sober since 2014.   Drug use: Never   Sexual activity: Yes  Other Topics Concern   Not on file  Social History Narrative   Right handed   Social Determinants of Health   Financial Resource  Strain: Not on file  Food Insecurity: Not on file  Transportation Needs: Not on file  Physical Activity: Not on file  Stress: Not on file  Social Connections: Not on file     Family History: The patient's ***family history includes Cancer in her mother; Osteoporosis in her mother. ROS:   Please see the history of present illness.    All other systems reviewed and are negative.  EKGs/Labs/Other Studies Reviewed:    The following studies were reviewed today:  EKG:  EKG ordered today and personally reviewed.  The ekg ordered today demonstrates ***  Recent Labs: 04/16/2020:  TSH 2.050 07/17/2020: Magnesium 1.8 11/01/2020: NT-Pro BNP 2,144 12/30/2020: ALT 17; BUN 43; Creatinine 1.1; Hemoglobin 9.7; Platelets 355; Potassium 4.1; Sodium 132  Recent Lipid Panel    Component Value Date/Time   CHOL 137 05/08/2020 1049   TRIG 55 05/08/2020 1049   HDL 93 05/08/2020 1049   CHOLHDL 1.5 05/08/2020 1049   LDLCALC 32 05/08/2020 1049    Physical Exam:    VS:  There were no vitals taken for this visit.    Wt Readings from Last 3 Encounters:  03/25/21 156 lb (70.8 kg)  12/30/20 164 lb 6.4 oz (74.6 kg)  12/19/20 160 lb 12.8 oz (72.9 kg)     GEN: *** Well nourished, well developed in no acute distress HEENT: Normal NECK: No JVD; No carotid bruits LYMPHATICS: No lymphadenopathy CARDIAC: ***RRR, no murmurs, rubs, gallops RESPIRATORY:  Clear to auscultation without rales, wheezing or rhonchi  ABDOMEN: Soft, non-tender, non-distended MUSCULOSKELETAL:  No edema; No deformity  SKIN: Warm and dry NEUROLOGIC:  Alert and oriented x 3 PSYCHIATRIC:  Normal affect    Signed, Shirlee More, MD  03/25/2021 11:55 AM    [place]

## 2021-03-25 NOTE — Progress Notes (Signed)
Subjective:  Patient ID: Molly Parrish, female    DOB: 1965-11-08  Age: 55 y.o. MRN: [id number]  Chief Complaint  Patient presents with   Insomnia   Hypertension   Fatigue    HPI Patient is a 55 year old white female with history of liver cirrhosis secondary to alcohol use, bipolar disorder, GERD, portal hypertension, pedal edema secondary to liver cirrhosis, hypothyroidism, and insomnia.  She is a recovering alcoholic and has not drank for years.  Liver cirrhosis and portal hypertension: Currently on Xifaxan 550 mg twice daily, spironolactone 50 mg 1 tablet twice daily, thiamine 100 mg once daily, propranolol 10 mg nightly.  Patient sees [facility].  Surgical hypothyroidism: Patient sees Dr. Tamala Julian.  She is on Synthroid 200 mcg daily.  Patient also has significant issues with hyponatremia and hypocalcemia at times.  GERD: Omeprazole 40 mg once daily at bedtime.  Patient has issues due to her gastric bypass and has to eat small meals.  She is eating mostly protein and vegetables she says.  Patient has frequent nausea and vomiting.  Bipolar disorder, depression: Managed by Dr. Erling Cruz.  Currently on Lamictal 200 mg once at bedtime. currenlty on zoloft 100 mg 2 daily, xanax 0.25 mg one twice daily prn, and seroquel 50 mg 2 at night, and hydroxyzine 25 mg one every 6 hrs prn. Insomnia has not improved with Rozerem.  Patient was previously on temazepam which her psychiatrist discontinued.  She reports she has not sleeping well at all.   Current Outpatient Medications on File Prior to Visit  Medication Sig Dispense Refill   ALPRAZolam (XANAX) 0.25 MG tablet TAKE 1 TABLET(0.25 MG) BY MOUTH TWICE DAILY AS NEEDED 60 tablet 1   famotidine (PEPCID) 40 MG tablet Take 40 mg by mouth at bedtime.     furosemide (LASIX) 40 MG tablet TAKE 2 TABLETS BY MOUTH TWICE DAILY 360 tablet 1   hydrOXYzine (VISTARIL) 25 MG capsule Take 1 capsule (25 mg total) by mouth every 8 (eight) hours as needed.  90 capsule 0   Iron-FA-B Cmp-C-Biot-Probiotic (FUSION PLUS) CAPS Take by mouth 2 (two) times daily.      Iron-Vitamins (GERITOL COMPLETE) TABS Take 1 tablet by mouth daily.     lamoTRIgine (LAMICTAL) 200 MG tablet Take 200 mg by mouth at bedtime.     levothyroxine (SYNTHROID) 200 MCG tablet Patient takes 1 tablets M-F and 1/2 Sat and Sun     magnesium oxide (MAG-OX) 400 MG tablet Take 400 mg by mouth daily.     omeprazole (PRILOSEC) 40 MG capsule Take 1 capsule by mouth at bedtime.     promethazine (PHENERGAN) 25 MG tablet TAKE 1 TABLET BY MOUTH FOUR TIMES DAILY AS NEEDED FOR NAUSEA OR VOMITING 40 tablet 2   propranolol (INDERAL) 10 MG tablet Take 10 mg by mouth at bedtime.     QUEtiapine (SEROQUEL) 50 MG tablet Take 100 mg by mouth at bedtime.     rifaximin (XIFAXAN) 550 MG TABS tablet Take 550 mg by mouth 2 (two) times daily.     sertraline (ZOLOFT) 100 MG tablet Take 100 mg by mouth in the morning and at bedtime.     spironolactone (ALDACTONE) 50 MG tablet Take 1 tablet (50 mg total) by mouth 2 (two) times daily. 60 tablet 0   thiamine 100 MG tablet Take 100 mg by mouth daily.     No current facility-administered medications on file prior to visit.   Past Medical History:  Diagnosis Date  Alcoholic cirrhosis of liver (HCC)    Anemia    Bipolar disorder current episode depressed (HCC)    CKD (chronic kidney disease)    Generalized anxiety disorder    GERD (gastroesophageal reflux disease)    Hypokalemia    Intestinal malabsorption    Korsakoff syndrome ([place])    Other osteoporosis without current pathological fracture    Other specified hypothyroidism    Papillary thyroid carcinoma ([place]) 12/10/2015   Papillary thyroid carcinoma ([place]) 12/10/2015   Portal hypertension (HCC)    Stroke ([place])     left basal ganglia stroke (hemorrhagic)   Vitamin D deficiency    Past Surgical History:  Procedure Laterality Date   CATARACT EXTRACTION     GASTRIC BYPASS  2005   HEMORRHOID SURGERY      HERNIA REPAIR     metatarsus abductus surgery Left 1990   PARTIAL HYSTERECTOMY  05/1997   BL Ovaries remain. Done for fibroids.   THYROIDECTOMY  03/2013    Family History  Problem Relation Age of Onset   Osteoporosis Mother    Cancer Mother        Breast, lung, skin.    Social History   Socioeconomic History   Marital status: Married    Spouse name: Not on file   Number of children: 1   Years of education: Not on file   Highest education level: Not on file  Occupational History   Occupation: Engineer, maintenance (IT)    Comment: Retired.  Tobacco Use   Smoking status: Never   Smokeless tobacco: Never  Substance and Sexual Activity   Alcohol use: Not Currently    Comment: history of alcoholism. sober since 2014.   Drug use: Never   Sexual activity: Yes  Other Topics Concern   Not on file  Social History Narrative   Right handed   Social Determinants of Health   Financial Resource Strain: Not on file  Food Insecurity: Not on file  Transportation Needs: Not on file  Physical Activity: Not on file  Stress: Not on file  Social Connections: Not on file    Review of Systems  Constitutional:  Positive for fatigue. Negative for chills and fever.  HENT:  Positive for ear pain. Negative for congestion, rhinorrhea and sore throat.   Respiratory:  Positive for shortness of breath. Negative for cough.   Cardiovascular:  Negative for chest pain.  Gastrointestinal:  Positive for nausea and vomiting. Negative for abdominal pain, constipation and diarrhea.  Endocrine: Positive for polydipsia.  Genitourinary:  Negative for dysuria and urgency.  Musculoskeletal:  Positive for back pain. Negative for arthralgias and myalgias.  Skin:        Dry skin  Neurological:  Positive for weakness and headaches. Negative for dizziness and light-headedness.  Psychiatric/Behavioral:  Negative for dysphoric mood. The patient is not nervous/anxious.     Objective:  BP (!) 100/56   Pulse 80   Temp 97.8 F  (36.6 C)   Resp 16   Ht '5\' 6"'$  (1.676 m)   Wt 156 lb (70.8 kg)   BMI 25.18 kg/m   BP/Weight 04/04/2021 03/25/2021 99991111  Systolic BP [id number]  Diastolic BP 62 56 58  Wt. (Lbs) 155.6 156 164.4  BMI 25.11 25.18 26.53    Physical Exam Vitals reviewed.  Constitutional:      Appearance: Normal appearance. She is normal weight.  HENT:     Right Ear: Tympanic membrane normal.     Left Ear: Tympanic membrane normal.  Neck:     Vascular: No carotid bruit.  Cardiovascular:     Rate and Rhythm: Normal rate and regular rhythm.     Heart sounds: Normal heart sounds.  Pulmonary:     Effort: Pulmonary effort is normal. No respiratory distress.     Breath sounds: Normal breath sounds.  Abdominal:     General: Abdomen is flat. Bowel sounds are normal.     Palpations: Abdomen is soft.     Tenderness: There is no abdominal tenderness.  Skin:    Comments: Thin hair.  Neurological:     Mental Status: She is alert and oriented to person, place, and time.  Psychiatric:        Mood and Affect: Mood normal.        Behavior: Behavior normal.    Diabetic Foot Exam - Simple   No data filed      Lab Results  Component Value Date   WBC 7.7 04/04/2021   HGB 10.7 (A) 04/04/2021   HCT 33 (A) 04/04/2021   PLT 306 04/04/2021   GLUCOSE 78 03/25/2021   CHOL 137 05/08/2020   TRIG 55 05/08/2020   HDL 93 05/08/2020   LDLCALC 32 05/08/2020   ALT 22 04/04/2021   AST 33 04/04/2021   NA 132 (A) 04/04/2021   K 3.1 (A) 04/04/2021   CL 91 (A) 04/04/2021   CREATININE 1.8 (A) 04/04/2021   BUN 38 (A) 04/04/2021   CO2 29 (A) 04/04/2021   TSH 5.890 (H) 03/25/2021      Assessment & Plan:   Problem List Items Addressed This Visit       Cardiovascular and Mediastinum   Portal hypertension (HCC)    Continue propranolol 10 mg once daily.       Relevant Orders   Comprehensive metabolic panel (Completed)     Digestive   Alcoholic cirrhosis of liver (HCC)    The current medical regimen  is effective;  continue present plan and medications. No longer drinking alcohol.      Intestinal malabsorption    Secondary to gastric bypass.  Has phenergan for nausea.         Endocrine   Post-surgical hypothyroidism    The current medical regimen is effective;  continue present plan and medications.       Relevant Orders   TSH (Completed)     Musculoskeletal and Integument   Telogen effluvium    Recommend rogaine otc.       Relevant Medications   MINOXIDIL, TOPICAL, 5 % SOLN     Other   Alcohol dependence in remission (HCC)    Continued abstinence.      Anemia of chronic disease    Continue to monitor cbc.      Relevant Orders   CBC with Differential/Platelet (Completed)   Bipolar disorder current episode depressed ([place])    Defer mgmt to psychiatry.       Primary insomnia    Start on dayvigo 5 mg daily. Stop rozerem.       Severe protein-calorie malnutrition ([place]) - Primary    Continue three meals per day.         Meds ordered this encounter  Medications   DISCONTD: Lemborexant (DAYVIGO) 5 MG TABS    Sig: Take 5 mg by mouth at bedtime.    Dispense:  7 tablet    Refill:  0   MINOXIDIL, TOPICAL, 5 % SOLN    Sig: Apply 1 mL topically in the morning  and at bedtime.    Dispense:  120 mL    Refill:  2    Orders Placed This Encounter  Procedures   CBC with Differential/Platelet   Comprehensive metabolic panel   TSH     Follow-up: No follow-ups on file.  An After Visit Summary was printed and given to the patient.  Rochel Brome, MD [facility] [phone]

## 2021-03-26 ENCOUNTER — Telehealth: Payer: Self-pay

## 2021-03-26 ENCOUNTER — Ambulatory Visit: Payer: Medicare Other | Admitting: Cardiology

## 2021-03-26 ENCOUNTER — Other Ambulatory Visit: Payer: Self-pay | Admitting: Family Medicine

## 2021-03-26 ENCOUNTER — Encounter: Payer: Self-pay | Admitting: Family Medicine

## 2021-03-26 DIAGNOSIS — E43 Unspecified severe protein-calorie malnutrition: Secondary | ICD-10-CM

## 2021-03-26 DIAGNOSIS — F5101 Primary insomnia: Secondary | ICD-10-CM

## 2021-03-26 DIAGNOSIS — L650 Telogen effluvium: Secondary | ICD-10-CM | POA: Insufficient documentation

## 2021-03-26 HISTORY — DX: Unspecified severe protein-calorie malnutrition: E43

## 2021-03-26 HISTORY — DX: Primary insomnia: F51.01

## 2021-03-26 HISTORY — DX: Telogen effluvium: L65.0

## 2021-03-26 LAB — CBC WITH DIFFERENTIAL/PLATELET
Basophils Absolute: 0.1 10*3/uL (ref 0.0–0.2)
Basos: 1 %
EOS (ABSOLUTE): 0.3 10*3/uL (ref 0.0–0.4)
Eos: 5 %
Hematocrit: 31.9 % — ABNORMAL LOW (ref 34.0–46.6)
Hemoglobin: 10.5 g/dL — ABNORMAL LOW (ref 11.1–15.9)
Immature Grans (Abs): 0 10*3/uL (ref 0.0–0.1)
Immature Granulocytes: 0 %
Lymphocytes Absolute: 1.3 10*3/uL (ref 0.7–3.1)
Lymphs: 19 %
MCH: 29.7 pg (ref 26.6–33.0)
MCHC: 32.9 g/dL (ref 31.5–35.7)
MCV: 90 fL (ref 79–97)
Monocytes Absolute: 0.6 10*3/uL (ref 0.1–0.9)
Monocytes: 9 %
Neutrophils Absolute: 4.5 10*3/uL (ref 1.4–7.0)
Neutrophils: 66 %
Platelets: 328 10*3/uL (ref 150–450)
RBC: 3.53 x10E6/uL — ABNORMAL LOW (ref 3.77–5.28)
RDW: 13.4 % (ref 11.7–15.4)
WBC: 6.8 10*3/uL (ref 3.4–10.8)

## 2021-03-26 LAB — COMPREHENSIVE METABOLIC PANEL
ALT: 19 IU/L (ref 0–32)
AST: 24 IU/L (ref 0–40)
Albumin/Globulin Ratio: 1.5 (ref 1.2–2.2)
Albumin: 4 g/dL (ref 3.8–4.9)
Alkaline Phosphatase: 131 IU/L — ABNORMAL HIGH (ref 44–121)
BUN/Creatinine Ratio: 20 (ref 9–23)
BUN: 32 mg/dL — ABNORMAL HIGH (ref 6–24)
Bilirubin Total: 0.3 mg/dL (ref 0.0–1.2)
CO2: 24 mmol/L (ref 20–29)
Calcium: 10.9 mg/dL — ABNORMAL HIGH (ref 8.7–10.2)
Chloride: 92 mmol/L — ABNORMAL LOW (ref 96–106)
Creatinine, Ser: 1.61 mg/dL — ABNORMAL HIGH (ref 0.57–1.00)
Globulin, Total: 2.6 g/dL (ref 1.5–4.5)
Glucose: 78 mg/dL (ref 65–99)
Potassium: 4.1 mmol/L (ref 3.5–5.2)
Sodium: 133 mmol/L — ABNORMAL LOW (ref 134–144)
Total Protein: 6.6 g/dL (ref 6.0–8.5)
eGFR: 37 mL/min/{1.73_m2} — ABNORMAL LOW (ref >59)

## 2021-03-26 LAB — TSH: TSH: 5.89 u[IU]/mL — ABNORMAL HIGH (ref 0.450–4.500)

## 2021-03-26 NOTE — Assessment & Plan Note (Signed)
Recommend rogaine otc.

## 2021-03-26 NOTE — Assessment & Plan Note (Signed)
The current medical regimen is effective;  continue present plan and medications.  

## 2021-03-26 NOTE — Assessment & Plan Note (Signed)
Continue propranolol 10 mg once daily.

## 2021-03-26 NOTE — Assessment & Plan Note (Signed)
Continue three meals per day.

## 2021-03-26 NOTE — Assessment & Plan Note (Signed)
The current medical regimen is effective;  continue present plan and medications. No longer drinking alcohol.

## 2021-03-26 NOTE — Assessment & Plan Note (Signed)
Start on dayvigo 5 mg daily. Stop rozerem.

## 2021-03-26 NOTE — Assessment & Plan Note (Signed)
Defer mgmt to psychiatry.

## 2021-03-26 NOTE — Telephone Encounter (Signed)
Pt called and states she forgot to ask at appointment. She has been having headaches and insomnia. She is questioning what she can take for headache as she has been told she cannot take ibuprofen, tylenol, and aspirin due to different lab levels. Please advise.   Harrell Lark 03/26/21 4:38 PM

## 2021-03-26 NOTE — Assessment & Plan Note (Signed)
Secondary to gastric bypass.  Has phenergan for nausea.

## 2021-03-26 NOTE — Assessment & Plan Note (Signed)
Continued abstinence.

## 2021-03-26 NOTE — Assessment & Plan Note (Signed)
Continue to monitor cbc.

## 2021-03-27 NOTE — Telephone Encounter (Signed)
Called pt. Pt VU.   Molly Parrish, [place] 03/27/21 8:48 AM

## 2021-03-27 NOTE — Telephone Encounter (Signed)
Pt returned call. States she understood she was to take dayvigo 5 mg x2 once at bedtime instead of only 5 mg once. She has been taking the 5 mg x2 for past two days and last night slept about 6 1/2 hours. She is asking if she can continue at this dose.   Royce Macadamia, [place] 03/27/21 10:36 AM

## 2021-03-28 ENCOUNTER — Other Ambulatory Visit: Payer: Self-pay | Admitting: Family Medicine

## 2021-03-28 ENCOUNTER — Other Ambulatory Visit: Payer: Self-pay | Admitting: Nurse Practitioner

## 2021-03-28 MED ORDER — DAYVIGO 10 MG PO TABS: 10.0000 mg | ORAL_TABLET | Freq: Every day | ORAL | 2 refills | Status: DC

## 2021-03-28 NOTE — Telephone Encounter (Signed)
Yes. Please send dayvigo 10 mg at night. 30/2.  Kc

## 2021-03-28 NOTE — Addendum Note (Signed)
Addended by: Billie Lade C on: 03/28/2021 12:31 PM   Modules accepted: Orders

## 2021-03-28 NOTE — Telephone Encounter (Signed)
Refill sent to pharmacy.   

## 2021-04-01 ENCOUNTER — Inpatient Hospital Stay: Payer: 59

## 2021-04-01 ENCOUNTER — Ambulatory Visit: Payer: Medicare Other | Admitting: Oncology

## 2021-04-03 NOTE — Progress Notes (Signed)
[facility]  [address] [place],  [place]  [address] [phone]  Clinic Day:  04/04/2021  Referring physician: Rochel Brome, MD  This document serves as a record of services personally performed by Marice Potter, MD. It was created on their behalf by [facility], a trained medical scribe. The creation of this record is based on the scribe's personal observations and the provider's statements to them.  HISTORY OF PRESENT ILLNESS:  The patient is a 55 y.o. female with leukocytosis and anemia.  She comes in today to reassess her peripheral counts.  Since her last visit, she has been doing fairly well.  She denies having increased fatigue or any overt forms of blood loss which concern her for progressive anemia.  She denies having recent infections, inflammatory problems or other issues that could be exacerbating her leukocytosis.  PHYSICAL EXAM:  Blood pressure 110/62, pulse 85, temperature 98 F (36.7 C), resp. rate 16, height '5\' 6"'$  (1.676 m), weight 155 lb 9.6 oz (70.6 kg), SpO2 99 %. Wt Readings from Last 3 Encounters:  04/04/21 155 lb 9.6 oz (70.6 kg)  03/25/21 156 lb (70.8 kg)  12/30/20 164 lb 6.4 oz (74.6 kg)   Body mass index is 25.11 kg/m. Performance status (ECOG): 2 - Symptomatic, <50% confined to bed Physical Exam Constitutional:      Appearance: Normal appearance. She is ill-appearing (chronically ill appearance, but  she looks better vs previous visits).  HENT:     Mouth/Throat:     Mouth: Mucous membranes are moist.     Pharynx: Oropharynx is clear. No oropharyngeal exudate or posterior oropharyngeal erythema.  Cardiovascular:     Rate and Rhythm: Normal rate and regular rhythm.     Heart sounds: No murmur heard.   No friction rub. No gallop.  Pulmonary:     Effort: Pulmonary effort is normal. No respiratory distress.     Breath sounds: Normal breath sounds. No wheezing, rhonchi or rales.  Abdominal:     General: Bowel  sounds are normal. There is no distension.     Palpations: Abdomen is soft. There is no mass.     Tenderness: There is no abdominal tenderness.  Musculoskeletal:        General: No swelling.     Right lower leg: No edema.     Left lower leg: No edema.  Lymphadenopathy:     Cervical: No cervical adenopathy.     Upper Body:     Right upper body: No supraclavicular or axillary adenopathy.     Left upper body: No supraclavicular or axillary adenopathy.     Lower Body: No right inguinal adenopathy. No left inguinal adenopathy.  Skin:    General: Skin is warm.     Coloration: Skin is not jaundiced.     Findings: No lesion or rash.  Neurological:     General: No focal deficit present.     Mental Status: She is alert and oriented to person, place, and time. Mental status is at baseline.     Cranial Nerves: Cranial nerves are intact.  Psychiatric:        Mood and Affect: Mood normal.        Behavior: Behavior normal.        Thought Content: Thought content normal.    LABS:   CBC Latest Ref Rng & Units 04/04/2021 03/25/2021 12/30/2020  WBC - 7.7 6.8 10.6  Hemoglobin 12.0 - 16.0 10.7(A) 10.5(L) 9.7(A)  Hematocrit 36 -  46 33(A) 31.9(L) 29(A)  Platelets 150 - 399 306 328 355   CMP Latest Ref Rng & Units 04/04/2021 03/25/2021 12/30/2020  Glucose 65 - 99 mg/dL - 78 -  BUN 4 - 21 38(A) 32(H) 43(A)  Creatinine 0.5 - 1.1 1.8(A) 1.61(H) 1.1  Sodium 137 - 147 132(A) 133(L) 132(A)  Potassium 3.4 - 5.3 3.1(A) 4.1 4.1  Chloride 99 - 108 91(A) 92(L) 96(A)  CO2 13 - 22 29(A) 24 26(A)  Calcium 8.7 - 10.7 10.1 10.9(H) 7.0(A)  Total Protein 6.0 - 8.5 g/dL - 6.6 -  Total Bilirubin 0.0 - 1.2 mg/dL - 0.3 -  Alkaline Phos 25 - 125 127(A) 131(H) 129(A)  AST 13 - 35 33 24 28  ALT 7 - 35 '22 19 17   '$ ASSESSMENT & PLAN:  Assessment/Plan:  A 55 y.o. female with leukocytosis and anemia.  I am pleased as her white count remains normal.  I am also pleased as her hemoglobin is above 10.  In the past Retacrit shots  were discussed for her anemia, particularly as her kidney function is suboptimal.   As he hemoglobin remains above 10, her anemia continue to be followed conservatively.  Overall, she appears to be doing fine.  As that is the case, I will see her back in 6 months for repeat clinical assessment.  The patient understands all the plans discussed today and is in agreement with them.     I, Rita Ohara, am acting as scribe for Marice Potter, MD    I have reviewed this report as typed by the medical scribe, and it is complete and accurate.  Dequincy Macarthur Critchley, MD

## 2021-04-04 ENCOUNTER — Other Ambulatory Visit: Payer: Self-pay | Admitting: Hematology and Oncology

## 2021-04-04 ENCOUNTER — Other Ambulatory Visit: Payer: Self-pay | Admitting: Oncology

## 2021-04-04 ENCOUNTER — Inpatient Hospital Stay: Payer: 59 | Attending: Oncology

## 2021-04-04 ENCOUNTER — Inpatient Hospital Stay (HOSPITAL_BASED_OUTPATIENT_CLINIC_OR_DEPARTMENT_OTHER): Payer: 59 | Admitting: Oncology

## 2021-04-04 ENCOUNTER — Telehealth: Payer: Self-pay | Admitting: Oncology

## 2021-04-04 VITALS — BP 110/62 | HR 85 | Temp 98.0°F | Resp 16 | Ht 66.0 in | Wt 155.6 lb

## 2021-04-04 DIAGNOSIS — D72829 Elevated white blood cell count, unspecified: Secondary | ICD-10-CM | POA: Insufficient documentation

## 2021-04-04 DIAGNOSIS — D649 Anemia, unspecified: Secondary | ICD-10-CM | POA: Diagnosis present

## 2021-04-04 DIAGNOSIS — D638 Anemia in other chronic diseases classified elsewhere: Secondary | ICD-10-CM

## 2021-04-04 DIAGNOSIS — Z79899 Other long term (current) drug therapy: Secondary | ICD-10-CM | POA: Insufficient documentation

## 2021-04-04 LAB — BASIC METABOLIC PANEL
BUN: 38 — AB (ref 4–21)
CO2: 29 — AB (ref 13–22)
Chloride: 91 — AB (ref 99–108)
Creatinine: 1.8 — AB (ref 0.5–1.1)
Glucose: 93
Potassium: 3.1 — AB (ref 3.4–5.3)
Sodium: 132 — AB (ref 137–147)

## 2021-04-04 LAB — CBC
MCV: 90 (ref 81–99)
RBC: 3.68 — AB (ref 3.87–5.11)

## 2021-04-04 LAB — CBC AND DIFFERENTIAL
HCT: 33 — AB (ref 36–46)
Hemoglobin: 10.7 — AB (ref 12.0–16.0)
Neutrophils Absolute: 5.31
Platelets: 306 (ref 150–399)
WBC: 7.7

## 2021-04-04 LAB — HEPATIC FUNCTION PANEL
ALT: 22 (ref 7–35)
AST: 33 (ref 13–35)
Alkaline Phosphatase: 127 — AB (ref 25–125)
Bilirubin, Total: 0.3

## 2021-04-04 LAB — IRON AND TIBC
Iron: 55 ug/dL (ref 28–170)
Saturation Ratios: 19 % (ref 10.4–31.8)
TIBC: 288 ug/dL (ref 250–450)
UIBC: 233 ug/dL

## 2021-04-04 LAB — COMPREHENSIVE METABOLIC PANEL
Albumin: 4 (ref 3.5–5.0)
Calcium: 10.1 (ref 8.7–10.7)

## 2021-04-04 LAB — FERRITIN: Ferritin: 460 ng/mL — ABNORMAL HIGH (ref 11–307)

## 2021-04-04 NOTE — Telephone Encounter (Signed)
Per 9/16 LOS, patient scheduled for March 2023 Appt's.  Gave patient Appt Calendar

## 2021-04-11 ENCOUNTER — Ambulatory Visit: Payer: Medicare Other

## 2021-04-12 ENCOUNTER — Other Ambulatory Visit: Payer: Self-pay | Admitting: Family Medicine

## 2021-04-14 ENCOUNTER — Ambulatory Visit: Payer: 59

## 2021-04-14 ENCOUNTER — Other Ambulatory Visit: Payer: Self-pay

## 2021-04-14 DIAGNOSIS — N289 Disorder of kidney and ureter, unspecified: Secondary | ICD-10-CM

## 2021-04-15 ENCOUNTER — Encounter: Payer: Self-pay | Admitting: Oncology

## 2021-04-15 LAB — COMPREHENSIVE METABOLIC PANEL
ALT: 18 IU/L (ref 0–32)
AST: 22 IU/L (ref 0–40)
Albumin/Globulin Ratio: 1.5 (ref 1.2–2.2)
Albumin: 4 g/dL (ref 3.8–4.9)
Alkaline Phosphatase: 140 IU/L — ABNORMAL HIGH (ref 44–121)
BUN/Creatinine Ratio: 22 (ref 9–23)
BUN: 34 mg/dL — ABNORMAL HIGH (ref 6–24)
Bilirubin Total: 0.4 mg/dL (ref 0.0–1.2)
CO2: 24 mmol/L (ref 20–29)
Calcium: 10.1 mg/dL (ref 8.7–10.2)
Chloride: 90 mmol/L — ABNORMAL LOW (ref 96–106)
Creatinine, Ser: 1.57 mg/dL — ABNORMAL HIGH (ref 0.57–1.00)
Globulin, Total: 2.7 g/dL (ref 1.5–4.5)
Glucose: 76 mg/dL (ref 70–99)
Potassium: 3.9 mmol/L (ref 3.5–5.2)
Sodium: 132 mmol/L — ABNORMAL LOW (ref 134–144)
Total Protein: 6.7 g/dL (ref 6.0–8.5)
eGFR: 38 mL/min/{1.73_m2} — ABNORMAL LOW (ref >59)

## 2021-04-15 NOTE — Progress Notes (Deleted)
NEUROLOGY FOLLOW UP OFFICE NOTE  Molly Parrish [id number]  Assessment/Plan:   Transient ischemic attack, recurrent Unsteady gait/balance disorder - chronic - has peripheral neuropathy (possibly from long-term alcohol abuse), congenital feet abnormalities and chronic low back pain with lumbar radiculopathy  Secondary stroke prevention as managed by PCP: ASA 81mg  daily LDL goal less than 70 Hgb A1c goal less than 7 Normotensive blood pressure ***   Subjective:  Molly Patricia. Parrish is a 55 year old right-handed female with alcoholic cirrhosis, Korsakoff syndrome, portal hypertension, Bipolar disorder and generalized anxiety disorder who follows up for TIA.  UPDATE: Current medications:  ***  Due to unsteady gait, she had an MRI of the cervical spine on 11/12/2020, personally reviewed, which showed mild multilevel degenerative changes but no significant canal stenosis or myelopathy.     HISTORY: She was admitted to [facility] from 02/04/2020 to 02/07/2020 for bilateral community-acquired pneumonia, chronic lower GI bleed and questionable TIA.  She presented with shortness of breath and  left sided facial numbness and tingling and left upper extremity numbness and weakness and left lower extremity numbness.  She had trouble speaking as well.  CT of head showed no acute intracranial abnormality.  Follow up MRI of the brain showed minimal chronic small vessel ischemic changes and remote lacunar infarct within the left basal ganglia..  CTA of head and neck showed no large vessel occlusion or hemodynamically significant stenosis.  2D echocardiogram showed normal EF with no cardiac source of emboli.  Telemetry revealed no atrial fibrillation.  She had a Hgb of 6.9 and received 2 units of packed RBC.  For pneumonia, she was treated with Rocephin and discharged on Zithromax and continued on home Omnicef.  She had a follow up outpatient colonoscopy and endoscopy which revealed no bleeding or  ulcers.     She has a history of 3 prior TIAs.  However, she never took any antiplatelet therapy.  She is not on a statin.  She says her cholesterol is good.  She is on antihypertensives for portal hypertension.  She monitors her blood pressure daily.  It usually runs around 112/60.     Since these TIAs, she continues to have lower extremity weakness and balance problems.  She uses a cane for a while, even before the last TIA.  She reports increased falls.  She has numbness in the feet.  She has a history of chronic lumbar radiculopathy and idiopathic small fiber peripheral neuropathy previously worked up by prior neurologist.   PAST MEDICAL HISTORY: Past Medical History:  Diagnosis Date   Alcoholic cirrhosis of liver ([place])    Anemia    Bipolar disorder current episode depressed (HCC)    CKD (chronic kidney disease)    Generalized anxiety disorder    GERD (gastroesophageal reflux disease)    Hypokalemia    Intestinal malabsorption    Korsakoff syndrome ([place])    Other osteoporosis without current pathological fracture    Other specified hypothyroidism    Papillary thyroid carcinoma ([place]) 12/10/2015   Papillary thyroid carcinoma ([place]) 12/10/2015   Portal hypertension (HCC)    Stroke ([place])     left basal ganglia stroke (hemorrhagic)   Vitamin D deficiency     MEDICATIONS: Current Outpatient Medications on File Prior to Visit  Medication Sig Dispense Refill   ALPRAZolam (XANAX) 0.25 MG tablet TAKE 1 TABLET BY MOUTH TWICE DAILY 60 tablet 1   famotidine (PEPCID) 40 MG tablet Take 40 mg by mouth at bedtime.  furosemide (LASIX) 40 MG tablet TAKE 2 TABLETS BY MOUTH TWICE DAILY 360 tablet 1   hydrOXYzine (VISTARIL) 25 MG capsule Take 1 capsule (25 mg total) by mouth every 8 (eight) hours as needed. 90 capsule 0   Iron-FA-B Cmp-C-Biot-Probiotic (FUSION PLUS) CAPS Take by mouth 2 (two) times daily.      Iron-Vitamins (GERITOL COMPLETE) TABS Take 1 tablet by mouth daily.     lamoTRIgine  (LAMICTAL) 200 MG tablet Take 200 mg by mouth at bedtime.     Lemborexant (DAYVIGO) 10 MG TABS Take 10 mg by mouth at bedtime. 30 tablet 2   levothyroxine (SYNTHROID) 200 MCG tablet Patient takes 1 tablets M-F and 1/2 Sat and Sun     magnesium oxide (MAG-OX) 400 MG tablet Take 400 mg by mouth daily.     MINOXIDIL, TOPICAL, 5 % SOLN Apply 1 mL topically in the morning and at bedtime. 120 mL 2   omeprazole (PRILOSEC) 40 MG capsule Take 1 capsule by mouth at bedtime.     promethazine (PHENERGAN) 25 MG tablet TAKE 1 TABLET BY MOUTH FOUR TIMES DAILY AS NEEDED FOR NAUSEA OR VOMITING 40 tablet 2   propranolol (INDERAL) 10 MG tablet Take 10 mg by mouth at bedtime.     QUEtiapine (SEROQUEL) 50 MG tablet Take 100 mg by mouth at bedtime.     rifaximin (XIFAXAN) 550 MG TABS tablet Take 550 mg by mouth 2 (two) times daily.     sertraline (ZOLOFT) 100 MG tablet Take 100 mg by mouth in the morning and at bedtime.     spironolactone (ALDACTONE) 50 MG tablet Take 1 tablet (50 mg total) by mouth 2 (two) times daily. 60 tablet 0   thiamine 100 MG tablet Take 100 mg by mouth daily.     traMADol (ULTRAM) 50 MG tablet TAKE 2 TABLETS BY MOUTH TWICE DAILY FOR BACK PAIN 120 tablet 0   No current facility-administered medications on file prior to visit.    ALLERGIES: Allergies  Allergen Reactions   Cholestyramine     Rash and itching   Trintellix [Vortioxetine]     FAMILY HISTORY: Family History  Problem Relation Age of Onset   Osteoporosis Mother    Cancer Mother        Breast, lung, skin.       Objective:  *** General: No acute distress.  Patient appears ***-groomed.   Head:  Normocephalic/atraumatic Eyes:  Fundi examined but not visualized Neck: supple, no paraspinal tenderness, full range of motion Heart:  Regular rate and rhythm Lungs:  Clear to auscultation bilaterally Back: No paraspinal tenderness Neurological Exam: alert and oriented to person, place, and time.  Speech fluent and not  dysarthric, language intact.  CN II-XII intact. Bulk and tone normal, muscle strength  5/5 bilateral upper extremities, 4+/5 left hip flexion, 5-/5 right hip flexion and bilateral knee extension/flexion, 5/5 ankle dorsiflexion/plantar flexion.  Pinprick and vibratory sensation reduced in feet.  Deep tendon reflexes 3+ throughout, toes downgoing.  Finger to nose testing intact.  Cautious gait.  Romberg with sway   Metta Clines, DO  CC: Rochel Brome, MD

## 2021-04-16 ENCOUNTER — Ambulatory Visit: Payer: Medicare Other | Admitting: Neurology

## 2021-04-16 DIAGNOSIS — N1831 Chronic kidney disease, stage 3a: Secondary | ICD-10-CM | POA: Insufficient documentation

## 2021-04-16 DIAGNOSIS — N179 Acute kidney failure, unspecified: Secondary | ICD-10-CM | POA: Insufficient documentation

## 2021-04-16 DIAGNOSIS — R3129 Other microscopic hematuria: Secondary | ICD-10-CM

## 2021-04-16 DIAGNOSIS — N1832 Chronic kidney disease, stage 3b: Secondary | ICD-10-CM

## 2021-04-16 HISTORY — DX: Chronic kidney disease, stage 3b: N18.32

## 2021-04-16 HISTORY — DX: Other microscopic hematuria: R31.29

## 2021-04-25 ENCOUNTER — Telehealth: Payer: Self-pay | Admitting: *Deleted

## 2021-04-25 NOTE — Chronic Care Management (AMB) (Signed)
  Chronic Care Management   Outreach Note  04/25/2021 Name: Molly Parrish MRN: [id number] DOB: [birth year]  Molly Parrish is a 55 y.o. year old female who is a primary care patient of Cox, Kirsten, MD. I reached out to Molly Parrish by phone today in response to a referral sent by [name] primary care provider.  An unsuccessful telephone outreach was attempted today. The patient was referred to the case management team for assistance with care management and care coordination.   Follow Up Plan: A HIPAA compliant phone message was left for the patient providing contact information and requesting a return call.  The care management team will reach out to the patient again over the next 7 days.  If patient returns call to provider office, please advise to call [place]* at [phone].*  [facility]  Direct Dial: [phone]

## 2021-04-29 ENCOUNTER — Other Ambulatory Visit: Payer: Self-pay | Admitting: Family Medicine

## 2021-04-29 NOTE — Telephone Encounter (Signed)
Refill sent to pharmacy.   

## 2021-05-02 NOTE — Chronic Care Management (AMB) (Signed)
  Chronic Care Management   Note  05/02/2021 Name: Molly Parrish MRN: [id number] DOB: Nov 16, 1965  Molly Parrish is a 55 y.o. year old female who is a primary care patient of Cox, Kirsten, MD. I reached out to Molly Parrish by phone today in response to a referral sent by Molly Parrish's PCP.  Molly Parrish was given information about Chronic Care Management services today including:  CCM service includes personalized support from designated clinical staff supervised by her physician, including individualized plan of care and coordination with other care providers 24/7 contact phone numbers for assistance for urgent and routine care needs. Service will only be billed when office clinical staff spend 20 minutes or more in a month to coordinate care. Only one practitioner may furnish and bill the service in a calendar month. The patient may stop CCM services at any time (effective at the end of the month) by phone call to the office staff. The patient is responsible for co-pay (up to 20% after annual deductible is met) if co-pay is required by the individual health plan.   Patient agreed to services and verbal consent obtained.   Follow up plan: Telephone appointment with care management team member scheduled for:05/20/21  [facility]  Direct Dial: [phone]

## 2021-05-04 ENCOUNTER — Other Ambulatory Visit: Payer: Self-pay | Admitting: Legal Medicine

## 2021-05-04 ENCOUNTER — Other Ambulatory Visit: Payer: Self-pay | Admitting: Family Medicine

## 2021-05-09 ENCOUNTER — Other Ambulatory Visit: Payer: Self-pay

## 2021-05-09 ENCOUNTER — Ambulatory Visit: Payer: 59

## 2021-05-09 DIAGNOSIS — E890 Postprocedural hypothyroidism: Secondary | ICD-10-CM

## 2021-05-10 LAB — TSH: TSH: 0.127 u[IU]/mL — ABNORMAL LOW (ref 0.450–4.500)

## 2021-05-20 ENCOUNTER — Ambulatory Visit (INDEPENDENT_AMBULATORY_CARE_PROVIDER_SITE_OTHER): Payer: 59

## 2021-05-20 VITALS — Ht 66.0 in | Wt 145.0 lb

## 2021-05-20 DIAGNOSIS — G459 Transient cerebral ischemic attack, unspecified: Secondary | ICD-10-CM

## 2021-05-20 DIAGNOSIS — D638 Anemia in other chronic diseases classified elsewhere: Secondary | ICD-10-CM

## 2021-05-20 DIAGNOSIS — D500 Iron deficiency anemia secondary to blood loss (chronic): Secondary | ICD-10-CM | POA: Diagnosis not present

## 2021-05-20 DIAGNOSIS — K7030 Alcoholic cirrhosis of liver without ascites: Secondary | ICD-10-CM

## 2021-05-20 DIAGNOSIS — N1832 Chronic kidney disease, stage 3b: Secondary | ICD-10-CM

## 2021-05-20 NOTE — Patient Instructions (Signed)
Visit Information   PATIENT GOALS/PLAN OF CARE:  Care Plan : Chronic Kidney (Adult)  Updates made by Thana Ates, RN since 05/20/2021 12:00 AM     Problem: Disease Progression   Priority: High  Onset Date: 05/20/2021  Note:   Current Barriers:  Ineffective Self Health Maintenance in a patient with CKD Stage and cirrhosis  Clinical Goal(s):  Collaboration with Cox, Kirsten, MD regarding development and update of comprehensive plan of care as evidenced by provider attestation and co-signature Inter-disciplinary care team collaboration (see longitudinal plan of care) patient will work with care management team to address care coordination and chronic disease management needs related to    Interventions:  Evaluation of current treatment plan related to  chronic kidney disease and cirrhosis,  self-management and patient's adherence to plan as established by provider. Collaboration with Cox, Elnita Maxwell, MD regarding development and update of comprehensive plan of care as evidenced by provider attestation       and co-signature Inter-disciplinary care team collaboration (see longitudinal plan of care) Discussed plans with patient for ongoing care management follow up and provided patient with direct contact information for care management team Encouraged patient to discuss concerns with MD abouther chronic  medical problems. Encouraged paitent to take all her medications as prescribed Encouraged paitent to follow up with specialist as requested.  Self Care Activities:  Calls pharmacy for medication refills Calls provider office for new concerns or questions Patient Goals:  - ask for help if I can't afford my medicines - call for medicine refill 2 or 3 days before it runs out - keep follow-up appointments - keep taking my medicines, even when I feel good  Follow Up Plan: Telephone follow up appointment with care management team member scheduled for: 07/01/2021    Care Plan : Altered  mobility related to wound on foot, falls and problems with balance  Updates made by Thana Ates, RN since 05/20/2021 12:00 AM     Problem: Improved quality of life   Priority: High  Onset Date: 05/20/2021  Note:   Current Barriers:  Frequent falls related to altered balance, wound on foot.  Currently in a wheel chair. Cast on leg below the knee since March of 2022. Wound infection - pending reconstructive surgery No advanced directives Clinical Goal(s):  patient will demonstrate improved adherence to prescribed treatment plan for decreasing falls as evidenced by patient reporting and review of EMR patient will verbalize using fall risk reduction strategies discussed patient will not experience additional falls for 6 months Interventions:  Collaboration with Cox, Kirsten, MD regarding development and update of comprehensive plan of care as evidenced by provider attestation and co-signature Inter-disciplinary care team collaboration (see longitudinal plan of care) Provided written and verbal education re: Potential causes of falls and Fall prevention strategies Reviewed medications Assessed for s/s of orthostatic hypotension Provided education to patient and/or caregiver about advanced directives Care Guide referral for affordable dental care  Patient Goals:  - Utilize wheelchair and scooter (assistive device) appropriately with all ambulation. Patient is non weight bearing - Change positions slowly - Utilize home lighting for dim lit areas - Demonstrate self awareness - be mindful of altered balance Follow Up Plan: Telephone follow up appointment with care management team member scheduled for: 07/01/2021     Consent to CCM Services: Molly Parrish was given information about Chronic Care Management services including:  CCM service includes personalized support from designated clinical staff supervised by her physician, including individualized plan of care and  coordination with  other care providers 24/7 contact phone numbers for assistance for urgent and routine care needs. Service will only be billed when office clinical staff spend 20 minutes or more in a month to coordinate care. Only one practitioner may furnish and bill the service in a calendar month. The patient may stop CCM services at any time (effective at the end of the month) by phone call to the office staff. The patient will be responsible for cost sharing (co-pay) of up to 20% of the service fee (after annual deductible is met).  Patient agreed to services and verbal consent obtained.   The patient verbalized understanding of instructions, educational materials, and care plan provided today and agreed to receive a mailed copy of patient instructions, educational materials, and care plan.   Telephone follow up appointment with care management team member scheduled for:07/01/2021  Tomasa Rand RN, BSN, CEN RN Case Manager - [facility] Mobile: [phone]    Visit Information   PATIENT GOALS/PLAN OF CARE:  Care Plan : Chronic Kidney (Adult)  Updates made by Thana Ates, RN since 05/20/2021 12:00 AM     Problem: Disease Progression   Priority: High  Onset Date: 05/20/2021  Note:   Current Barriers:  Ineffective Self Health Maintenance in a patient with CKD Stage and cirrhosis  Clinical Goal(s):  Collaboration with Cox, Kirsten, MD regarding development and update of comprehensive plan of care as evidenced by provider attestation and co-signature Inter-disciplinary care team collaboration (see longitudinal plan of care) patient will work with care management team to address care coordination and chronic disease management needs related to    Interventions:  Evaluation of current treatment plan related to  chronic kidney disease and cirrhosis,  self-management and patient's adherence to plan as established by provider. Collaboration with Cox, Elnita Maxwell, MD  regarding development and update of comprehensive plan of care as evidenced by provider attestation       and co-signature Inter-disciplinary care team collaboration (see longitudinal plan of care) Discussed plans with patient for ongoing care management follow up and provided patient with direct contact information for care management team Encouraged patient to discuss concerns with MD abouther chronic  medical problems. Encouraged paitent to take all her medications as prescribed Encouraged paitent to follow up with specialist as requested.  Self Care Activities:  Calls pharmacy for medication refills Calls provider office for new concerns or questions Patient Goals:  - ask for help if I can't afford my medicines - call for medicine refill 2 or 3 days before it runs out - keep follow-up appointments - keep taking my medicines, even when I feel good  Follow Up Plan: Telephone follow up appointment with care management team member scheduled for: 07/01/2021    Care Plan : Altered mobility related to wound on foot, falls and problems with balance  Updates made by Thana Ates, RN since 05/20/2021 12:00 AM     Problem: Improved quality of life   Priority: High  Onset Date: 05/20/2021  Note:   Current Barriers:  Frequent falls related to altered balance, wound on foot.  Currently in a wheel chair. Cast on leg below the knee since March of 2022. Wound infection - pending reconstructive surgery No advanced directives Clinical Goal(s):  patient will demonstrate improved adherence to prescribed treatment plan for decreasing falls as evidenced by patient reporting and review of EMR patient will verbalize using fall risk reduction strategies discussed patient will not experience additional falls  for 6 months Interventions:  Collaboration with Cox, Kirsten, MD regarding development and update of comprehensive plan of care as evidenced by provider attestation and  co-signature Inter-disciplinary care team collaboration (see longitudinal plan of care) Provided written and verbal education re: Potential causes of falls and Fall prevention strategies Reviewed medications Assessed for s/s of orthostatic hypotension Provided education to patient and/or caregiver about advanced directives Care Guide referral for affordable dental care  Patient Goals:  - Utilize wheelchair and scooter (assistive device) appropriately with all ambulation. Patient is non weight bearing - Change positions slowly - Utilize home lighting for dim lit areas - Demonstrate self awareness - be mindful of altered balance Follow Up Plan: Telephone follow up appointment with care management team member scheduled for: 07/01/2021     Consent to CCM Services: Molly Parrish was given information about Chronic Care Management services including:  CCM service includes personalized support from designated clinical staff supervised by her physician, including individualized plan of care and coordination with other care providers 24/7 contact phone numbers for assistance for urgent and routine care needs. Service will only be billed when office clinical staff spend 20 minutes or more in a month to coordinate care. Only one practitioner may furnish and bill the service in a calendar month. The patient may stop CCM services at any time (effective at the end of the month) by phone call to the office staff. The patient will be responsible for cost sharing (co-pay) of up to 20% of the service fee (after annual deductible is met).  Patient agreed to services and verbal consent obtained.   The patient verbalized understanding of instructions, educational materials, and care plan provided today and agreed to receive a mailed copy of patient instructions, educational materials, and care plan.   Telephone follow up appointment with care management team member scheduled for:  SignatureVisit  Information  The patient verbalized understanding of instructions, educational materials, and care plan provided today and agreed to receive a mailed copy of patient instructions, educational materials, and care plan.   Telephone follow up appointment with care management team member scheduled for:  07/01/2021  Tomasa Rand RN, BSN, CEN RN Case Manager - [facility] Mobile: [phone]

## 2021-05-20 NOTE — Chronic Care Management (AMB) (Signed)
Chronic Care Management   CCM RN Visit Note  05/20/2021 Name: Molly Parrish MRN: [id number] DOB: [birth year]  Subjective: Molly Parrish is a 56 y.o. year old female who is a primary care patient of Cox, Kirsten, MD. The care management team was consulted for assistance with disease management and care coordination needs.    Engaged with patient by telephone for initial visit in response to provider referral for case management and/or care coordination services.   Consent to Services:  The patient was given the following information about Chronic Care Management services today, agreed to services, and gave verbal consent: 1. CCM service includes personalized support from designated clinical staff supervised by the primary care provider, including individualized plan of care and coordination with other care providers 2. 24/7 contact phone numbers for assistance for urgent and routine care needs. 3. Service will only be billed when office clinical staff spend 20 minutes or more in a month to coordinate care. 4. Only one practitioner may furnish and bill the service in a calendar month. 5.The patient may stop CCM services at any time (effective at the end of the month) by phone call to the office staff. 6. The patient will be responsible for cost sharing (co-pay) of up to 20% of the service fee (after annual deductible is met). Patient agreed to services and consent obtained.  Patient agreed to services and verbal consent obtained.   Assessment: Review of patient past medical history, allergies, medications, health status, including review of consultants reports, laboratory and other test data, was performed as part of comprehensive evaluation and provision of chronic care management services.   SDOH (Social Determinants of Health) assessments and interventions performed:  SDOH Interventions    Flowsheet Row Most Recent Value  SDOH Interventions   Food Insecurity Interventions Intervention  Not Indicated  Financial Strain Interventions Intervention Not Indicated  Housing Interventions Intervention Not Indicated  Intimate Partner Violence Interventions Intervention Not Indicated  Transportation Interventions Intervention Not Indicated        CCM Care Plan  Allergies  Allergen Reactions   Cholestyramine     Rash and itching   Trintellix [Vortioxetine]     Outpatient Encounter Medications as of 05/20/2021  Medication Sig   ALPRAZolam (XANAX) 0.25 MG tablet TAKE 1 TABLET BY MOUTH TWICE DAILY   Cyanocobalamin (VITAMIN B 12 PO) Take 1,000 mcg by mouth daily.   famotidine (PEPCID) 40 MG tablet Take 40 mg by mouth at bedtime.   furosemide (LASIX) 40 MG tablet TAKE 2 TABLETS BY MOUTH TWICE DAILY   hydrOXYzine (VISTARIL) 25 MG capsule TAKE 1 CAPSULE(25 MG) BY MOUTH THREE TIMES DAILY   Iron-FA-B Cmp-C-Biot-Probiotic (FUSION PLUS) CAPS Take by mouth 2 (two) times daily.    Iron-Vitamins (GERITOL COMPLETE) TABS Take 1 tablet by mouth daily.   lamoTRIgine (LAMICTAL) 200 MG tablet Take 200 mg by mouth at bedtime.   Lemborexant (DAYVIGO) 10 MG TABS Take 10 mg by mouth at bedtime.   levothyroxine (SYNTHROID) 200 MCG tablet Patient takes 1 tablets M-F and 1/2 Sat and Sun   magnesium oxide (MAG-OX) 400 MG tablet Take 400 mg by mouth daily.   omeprazole (PRILOSEC) 40 MG capsule Take 1 capsule by mouth at bedtime.   promethazine (PHENERGAN) 25 MG tablet TAKE 1 TABLET BY MOUTH FOUR TIMES DAILY AS NEEDED FOR NAUSEA OR VOMITING   propranolol (INDERAL) 10 MG tablet Take 10 mg by mouth at bedtime.   QUEtiapine (SEROQUEL) 50 MG tablet Take 100 mg by mouth  at bedtime.   rifaximin (XIFAXAN) 550 MG TABS tablet Take 550 mg by mouth 2 (two) times daily.   sertraline (ZOLOFT) 100 MG tablet Take 100 mg by mouth in the morning and at bedtime.   spironolactone (ALDACTONE) 50 MG tablet Take 1 tablet (50 mg total) by mouth 2 (two) times daily.   thiamine 100 MG tablet Take 100 mg by mouth daily.    traMADol (ULTRAM) 50 MG tablet TAKE 2 TABLETS BY MOUTH TWICE DAILY FOR BACK PAIN   MINOXIDIL, TOPICAL, 5 % SOLN Apply 1 mL topically in the morning and at bedtime. (Patient not taking: Reported on 05/20/2021)   No facility-administered encounter medications on file as of 05/20/2021.    Patient Active Problem List   Diagnosis Date Noted   Primary insomnia 03/26/2021   Telogen effluvium 03/26/2021   Severe protein-calorie malnutrition ([place]) 03/26/2021   Vitamin D deficiency    Hypokalemia    GERD (gastroesophageal reflux disease)    CKD (chronic kidney disease)    Bipolar disorder current episode depressed (HCC)    Anemia    Peripheral edema 11/01/2020   Shortness of breath 11/01/2020   Ulcer of right foot, with fat layer exposed ([place]) 10/10/2020   Ulcer of left foot with fat layer exposed ([place]) 10/04/2020   Generalized weakness 07/11/2020   Polypharmacy 06/12/2020   H/O bariatric surgery 06/11/2020   Multiple closed fractures of ribs of left side 06/03/2020   Iron deficiency anemia due to chronic blood loss 05/08/2020   Arterial hypotension 05/08/2020   Mild recurrent major depression ([place]) 04/19/2020   Generalized anxiety disorder 04/19/2020   Brain TIA 02/19/2020   Hyponatremia 02/19/2020   Neuropathy 01/31/2020   Lumbar disc disease 10/19/2019   Korsakoff syndrome ([place])    Other osteoporosis without current pathological fracture    Portal hypertension ([place])    Alcoholic cirrhosis of liver (HCC)    Anemia of chronic disease 08/08/2016   Hypocalcemia 12/10/2015   Post-surgical hypothyroidism 12/10/2015   Migraine 04/20/2014   Stroke ([place]) 04/20/2014   Obsessive-compulsive disorder 11/07/2013   Alcohol dependence in remission ([place]) 10/04/2013   Intestinal malabsorption 10/04/2013    Conditions to be addressed/monitored:CKD Stage 3B, cirrhosis, anemia, altered balance, TIAs  Care Plan : Chronic Kidney (Adult)  Updates made by Thana Ates, RN since 05/20/2021 12:00 AM      Problem: Disease Progression   Priority: High  Onset Date: 05/20/2021  Note:   Current Barriers:  Ineffective Self Health Maintenance in a patient with CKD Stage and cirrhosis  Clinical Goal(s):  Collaboration with Cox, Kirsten, MD regarding development and update of comprehensive plan of care as evidenced by provider attestation and co-signature Inter-disciplinary care team collaboration (see longitudinal plan of care) patient will work with care management team to address care coordination and chronic disease management needs related to    Interventions:  Evaluation of current treatment plan related to  chronic kidney disease and cirrhosis,  self-management and patient's adherence to plan as established by provider. Collaboration with Cox, Elnita Maxwell, MD regarding development and update of comprehensive plan of care as evidenced by provider attestation       and co-signature Inter-disciplinary care team collaboration (see longitudinal plan of care) Discussed plans with patient for ongoing care management follow up and provided patient with direct contact information for care management team Encouraged patient to discuss concerns with MD abouther chronic  medical problems. Encouraged paitent to take all her medications as prescribed Encouraged paitent to follow up  with specialist as requested.  Self Care Activities:  Calls pharmacy for medication refills Calls provider office for new concerns or questions Patient Goals:  - ask for help if I can't afford my medicines - call for medicine refill 2 or 3 days before it runs out - keep follow-up appointments - keep taking my medicines, even when I feel good  Follow Up Plan: Telephone follow up appointment with care management team member scheduled for: 07/01/2021    Care Plan : Altered mobility related to wound on foot, falls and problems with balance  Updates made by Thana Ates, RN since 05/20/2021 12:00 AM     Problem: Improved  quality of life   Priority: High  Onset Date: 05/20/2021  Note:   Current Barriers:  Frequent falls related to altered balance, wound on foot.  Currently in a wheel chair. Cast on leg below the knee since March of 2022. Wound infection - pending reconstructive surgery No advanced directives Clinical Goal(s):  patient will demonstrate improved adherence to prescribed treatment plan for decreasing falls as evidenced by patient reporting and review of EMR patient will verbalize using fall risk reduction strategies discussed patient will not experience additional falls for 6 months Interventions:  Collaboration with Cox, Kirsten, MD regarding development and update of comprehensive plan of care as evidenced by provider attestation and co-signature Inter-disciplinary care team collaboration (see longitudinal plan of care) Provided written and verbal education re: Potential causes of falls and Fall prevention strategies Reviewed medications Assessed for s/s of orthostatic hypotension Provided education to patient and/or caregiver about advanced directives Care Guide referral for affordable dental care  Patient Goals:  - Utilize wheelchair and scooter (assistive device) appropriately with all ambulation. Patient is non weight bearing - Change positions slowly - Utilize home lighting for dim lit areas - Demonstrate self awareness - be mindful of altered balance Follow Up Plan: Telephone follow up appointment with care management team member scheduled for: 07/01/2021     Plan:Telephone follow up appointment with care management team member scheduled for:  07/01/2021  Tomasa Rand RN, BSN, CEN RN Case Manager - [facility] [employer] Mobile: [phone]

## 2021-05-23 ENCOUNTER — Other Ambulatory Visit: Payer: Self-pay | Admitting: Family Medicine

## 2021-05-26 ENCOUNTER — Telehealth: Payer: Self-pay | Admitting: *Deleted

## 2021-05-26 NOTE — Telephone Encounter (Signed)
   Telephone encounter was:  Unsuccessful.  05/26/2021 Name: Molly Parrish MRN: [id number] DOB: [birth year]  Unsuccessful outbound call made today to assist with:   Dental   Outreach Attempt:  1st Attempt  A HIPAA compliant voice message was left requesting a return call.  Instructed patient to call back at   Instructed patient to call back at [phone]  at their earliest convenience. .  [place], Care Management  [phone] [address] , [place] [address] Email : [email or web address]

## 2021-05-29 ENCOUNTER — Other Ambulatory Visit: Payer: Self-pay

## 2021-05-30 ENCOUNTER — Encounter: Payer: Self-pay | Admitting: Cardiology

## 2021-05-30 ENCOUNTER — Ambulatory Visit (INDEPENDENT_AMBULATORY_CARE_PROVIDER_SITE_OTHER): Payer: 59 | Admitting: Cardiology

## 2021-05-30 ENCOUNTER — Other Ambulatory Visit: Payer: Self-pay

## 2021-05-30 ENCOUNTER — Other Ambulatory Visit: Payer: Self-pay | Admitting: Family Medicine

## 2021-05-30 VITALS — BP 98/52 | HR 74 | Ht 66.0 in | Wt 141.6 lb

## 2021-05-30 DIAGNOSIS — K766 Portal hypertension: Secondary | ICD-10-CM

## 2021-05-30 DIAGNOSIS — I9589 Other hypotension: Secondary | ICD-10-CM | POA: Diagnosis not present

## 2021-05-30 DIAGNOSIS — M818 Other osteoporosis without current pathological fracture: Secondary | ICD-10-CM

## 2021-05-30 DIAGNOSIS — R600 Localized edema: Secondary | ICD-10-CM | POA: Diagnosis not present

## 2021-05-30 DIAGNOSIS — K7031 Alcoholic cirrhosis of liver with ascites: Secondary | ICD-10-CM | POA: Diagnosis not present

## 2021-05-30 NOTE — Progress Notes (Signed)
Cardiology Office Note:    Date:  05/30/2021   ID:  Molly Parrish, DOB [birth year], MRN [id number]  PCP:  Molly Brome, MD  Cardiologist:  Molly More, MD    Referring MD: Molly Brome, MD    ASSESSMENT:    1. Bilateral lower extremity edema   2. Alcoholic cirrhosis of liver with ascites ([place])   3. Portal hypertension (HCC)   4. Other specified hypotension   5. Other osteoporosis without current pathological fracture    PLAN:    In order of problems listed above:  She has had a marked improvement she has no fluid overload she is taking high dose loop diuretic decreased 50% recheck renal function continue MRA particularly helpful with her liver disease.  I independently reviewed her echocardiogram and I think her problem is either entirely or predominantly due to her liver disease and she will likely require less loop diuretic in the future only follow-up in my office as needed Seen gastroenterology in some distal diuretic for her portal hypertension and edema Will reduce the dose of her loop diuretic blood pressure remains less than 010 systolic Check vitamin D request of the wound care physician copy to him   Next appointment: See me back in the office as needed   Medication Adjustments/Labs and Tests Ordered: Current medicines are reviewed at length with the patient today.  Concerns regarding medicines are outlined above.  Orders Placed This Encounter  Procedures   Basic metabolic panel   VITAMIN D 25 Hydroxy (Vit-D Deficiency, Fractures)   No orders of the defined types were placed in this encounter.  Follow-up for edema in the setting of cirrhosis and portal hypertension she had an echocardiogram performed  History of Present Illness:    Molly Parrish is a 55 y.o. female with a hx of alcoholic cirrhosis of the liver with ascites portal hypertension and lower extremity edema with elevated BNP level and hypotension last seen 12/19/2020 with edema and shortness  of breath in the setting of her liver disease diminished proteins and anemia.  Compliance with diet, lifestyle and medications: Yes  She is functionally improved she has no edema she tells me her weight at home is down about 20 pounds and has taken both high-dose loop diuretic as well as distal MRA.  We will have her decrease her furosemide to 50%.  She is not short of breath no orthopnea palpitations syncope or chest pain. She has a pressure ulcer right foot is seeing the wound center [facility] surgery in January she tells me that she was asked to have her vitamin D level lab ordered it and result will go to Dr. Zigmund Molly  An echocardiogram performed 03/04/2021. Ejection fraction 60 to 65% with elevated left atrial pressure right ventricle normal size and function with mildly elevated pulmonary artery pressure left atrium is moderately enlarged there is mild mitral regurgitation.  I reviewed the echocardiogram myself and I would define diastolic function as indeterminate. Past Medical History:  Diagnosis Date   Alcohol dependence in remission ([place]) 2/72/5366   Alcoholic cirrhosis of liver (HCC)    Anemia    Anemia of chronic disease 08/08/2016   Arterial hypotension 05/08/2020   Bipolar disorder current episode depressed ([place])    Brain TIA 02/19/2020   CKD (chronic kidney disease)    Episodic mood disorder (HCC) 11/07/2013   Generalized anxiety disorder    Generalized weakness 07/11/2020   GERD (gastroesophageal reflux disease)  H/O bariatric surgery 06/11/2020   Hypocalcemia 12/10/2015   Hypokalemia    Hyponatremia 02/19/2020   Intestinal malabsorption    Iron deficiency anemia due to chronic blood loss 05/08/2020   Korsakoff syndrome ([place])    Lumbar disc disease 10/19/2019   Microscopic hematuria 04/16/2021   Migraine 04/20/2014   Mild recurrent major depression ([place]) 04/19/2020   Multiple closed fractures of ribs of left side 06/03/2020    Formatting of this note might be different from the original. Added automatically from request for surgery [id number]   Neuropathy 01/31/2020   Obsessive-compulsive disorder 11/07/2013   Other osteoporosis without current pathological fracture    Peripheral edema 11/01/2020   Polypharmacy 06/12/2020   Portal hypertension ([place])    Post-surgical hypothyroidism 12/10/2015   Primary insomnia 03/26/2021   Severe protein-calorie malnutrition ([place]) 03/26/2021   Shortness of breath 11/01/2020   Stage 3b chronic kidney disease ([place]) 04/16/2021   Stroke ([place])     left basal ganglia stroke (hemorrhagic)   Telogen effluvium 03/26/2021   Ulcer of left foot with fat layer exposed ([place]) 10/04/2020   Ulcer of right foot, with fat layer exposed ([place]) 10/10/2020   Vitamin D deficiency     Past Surgical History:  Procedure Laterality Date   CATARACT EXTRACTION     GASTRIC BYPASS  2005   HEMORRHOID SURGERY     HERNIA REPAIR     metatarsus abductus surgery Left 1990   PARTIAL HYSTERECTOMY  05/1997   BL Ovaries remain. Done for fibroids.   THYROIDECTOMY  03/2013    Current Medications: Current Meds  Medication Sig   ALPRAZolam (XANAX) 0.25 MG tablet TAKE 1 TABLET BY MOUTH TWICE DAILY   Cyanocobalamin (VITAMIN B 12 PO) Take 1,000 mcg by mouth daily.   famotidine (PEPCID) 40 MG tablet Take 40 mg by mouth at bedtime.   furosemide (LASIX) 40 MG tablet TAKE 2 TABLETS BY MOUTH TWICE DAILY   hydrOXYzine (VISTARIL) 25 MG capsule TAKE 1 CAPSULE(25 MG) BY MOUTH THREE TIMES DAILY   Iron-FA-B Cmp-C-Biot-Probiotic (FUSION PLUS) CAPS Take 1 capsule by mouth 2 (two) times daily.   Iron-Vitamins (GERITOL COMPLETE) TABS Take 1 tablet by mouth daily.   lamoTRIgine (LAMICTAL) 200 MG tablet Take 200 mg by mouth at bedtime.   Lemborexant (DAYVIGO) 10 MG TABS Take 10 mg by mouth at bedtime.   levothyroxine (SYNTHROID) 200 MCG tablet Take 200 mcg by mouth daily. M-F and takes 1/2 tablet by mouth Sat and Sun   magnesium oxide  (MAG-OX) 400 MG tablet Take 400 mg by mouth daily.   MINOXIDIL, TOPICAL, 5 % SOLN Apply 1 mL topically in the morning and at bedtime.   omeprazole (PRILOSEC) 40 MG capsule Take 1 capsule by mouth at bedtime.   promethazine (PHENERGAN) 25 MG tablet TAKE 1 TABLET BY MOUTH FOUR TIMES DAILY AS NEEDED FOR NAUSEA OR VOMITING   propranolol (INDERAL) 10 MG tablet Take 10 mg by mouth at bedtime.   QUEtiapine (SEROQUEL) 50 MG tablet Take 100 mg by mouth at bedtime.   rifaximin (XIFAXAN) 550 MG TABS tablet Take 550 mg by mouth 2 (two) times daily.   sertraline (ZOLOFT) 100 MG tablet Take 100 mg by mouth in the morning and at bedtime.   spironolactone (ALDACTONE) 50 MG tablet Take 1 tablet (50 mg total) by mouth 2 (two) times daily.   thiamine 100 MG tablet Take 100 mg by mouth daily.   traMADol (ULTRAM) 50 MG tablet TAKE 2 TABLETS BY MOUTH TWICE DAILY  FOR BACK PAIN     Allergies:   Cholestyramine and Trintellix [vortioxetine]   Social History   Socioeconomic History   Marital status: Married    Spouse name: Not on file   Number of children: 1   Years of education: Not on file   Highest education level: Not on file  Occupational History   Occupation: Engineer, maintenance (IT)    Comment: Retired.  Tobacco Use   Smoking status: Never   Smokeless tobacco: Never  Substance and Sexual Activity   Alcohol use: Not Currently    Comment: history of alcoholism. sober since 2014.   Drug use: Never   Sexual activity: Yes  Other Topics Concern   Not on file  Social History Narrative   Right handed   Social Determinants of Health   Financial Resource Strain: Low Risk    Difficulty of Paying Living Expenses: Not hard at all  Food Insecurity: No Food Insecurity   Worried About Charity fundraiser in the Last Year: Never true   [place] in the Last Year: Never true  Transportation Needs: No Transportation Needs   Lack of Transportation (Medical): No   Lack of Transportation (Non-Medical): No  Physical  Activity: Not on file  Stress: Not on file  Social Connections: Not on file     Family History: The patient's family history includes Cancer in her mother; Osteoporosis in her mother. ROS:   Please see the history of present illness.    All other systems reviewed and are negative.  EKGs/Labs/Other Studies Reviewed:    The following studies were reviewed today:    Recent Labs: 07/17/2020: Magnesium 1.8 11/01/2020: NT-Pro BNP 2,144 04/04/2021: Hemoglobin 10.7; Platelets 306 04/14/2021: ALT 18; BUN 34; Creatinine, Ser 1.57; Potassium 3.9; Sodium 132 05/09/2021: TSH 0.127  Recent Lipid Panel    Component Value Date/Time   CHOL 137 05/08/2020 1049   TRIG 55 05/08/2020 1049   HDL 93 05/08/2020 1049   CHOLHDL 1.5 05/08/2020 1049   LDLCALC 32 05/08/2020 1049    Physical Exam:    VS:  BP (!) 98/52   Pulse 74   Ht 5\' 6"  (1.676 m)   Wt 141 lb 9.6 oz (64.2 kg)   SpO2 96%   BMI 22.85 kg/m     Wt Readings from Last 3 Encounters:  05/30/21 141 lb 9.6 oz (64.2 kg)  05/20/21 145 lb (65.8 kg)  04/04/21 155 lb 9.6 oz (70.6 kg)     GEN: She is improved well nourished, well developed in no acute distress HEENT: Normal NECK: No JVD; No carotid bruits LYMPHATICS: No lymphadenopathy CARDIAC: RRR, no murmurs, rubs, gallops RESPIRATORY:  Clear to auscultation without rales, wheezing or rhonchi  ABDOMEN: Soft, non-tender, non-distended MUSCULOSKELETAL:  No edema; No deformity  SKIN: Warm and dry NEUROLOGIC:  Alert and oriented x 3 PSYCHIATRIC:  Normal affect    Signed, Molly More, MD  05/30/2021 11:54 AM    [place]

## 2021-05-30 NOTE — Patient Instructions (Signed)
Medication Instructions:  Your physician has recommended you make the following change in your medication:   Decrease your Lasix (Furosemide) to 40 mg twice daily.  *If you need a refill on your cardiac medications before your next appointment, please call your pharmacy*   Lab Work: Your physician recommends that you have labs done in the office today. Your test included  basic metabolic panel and vitamin D.  If you have labs (blood work) drawn today and your tests are completely normal, you will receive your results only by: [place] (if you have MyChart) OR A paper copy in the mail If you have any lab test that is abnormal or we need to change your treatment, we will call you to review the results.   Testing/Procedures: None ordered   Follow-Up: At [facility], you and your health needs are our priority.  As part of our continuing mission to provide you with exceptional heart care, we have created designated Provider Care Teams.  These Care Teams include your primary Cardiologist (physician) and Advanced Practice Providers (APPs -  Physician Assistants and Nurse Practitioners) who all work together to provide you with the care you need, when you need it.  We recommend signing up for the patient portal called "MyChart".  Sign up information is provided on this After Visit Summary.  MyChart is used to connect with patients for Virtual Visits (Telemedicine).  Patients are able to view lab/test results, encounter notes, upcoming appointments, etc.  Non-urgent messages can be sent to your provider as well.   To learn more about what you can do with MyChart, go to [email or web address].    Your next appointment:   As needed  The format for your next appointment:   In Person  Provider:   Shirlee More, MD   Other Instructions NA

## 2021-05-31 LAB — BASIC METABOLIC PANEL
BUN/Creatinine Ratio: 22 (ref 9–23)
BUN: 33 mg/dL — ABNORMAL HIGH (ref 6–24)
CO2: 26 mmol/L (ref 20–29)
Calcium: 8.8 mg/dL (ref 8.7–10.2)
Chloride: 95 mmol/L — ABNORMAL LOW (ref 96–106)
Creatinine, Ser: 1.53 mg/dL — ABNORMAL HIGH (ref 0.57–1.00)
Glucose: 84 mg/dL (ref 70–99)
Potassium: 3.5 mmol/L (ref 3.5–5.2)
Sodium: 135 mmol/L (ref 134–144)
eGFR: 39 mL/min/{1.73_m2} — ABNORMAL LOW (ref >59)

## 2021-05-31 LAB — VITAMIN D 25 HYDROXY (VIT D DEFICIENCY, FRACTURES): Vit D, 25-Hydroxy: 19 ng/mL — ABNORMAL LOW (ref 30.0–100.0)

## 2021-06-02 ENCOUNTER — Telehealth: Payer: Self-pay

## 2021-06-02 NOTE — Telephone Encounter (Signed)
Spoke with patient regarding results and recommendation.  Patient verbalizes understanding and is agreeable to plan of care. Advised patient to call back with any issues or concerns.  

## 2021-06-02 NOTE — Telephone Encounter (Signed)
-----   Message from Richardo Priest, MD sent at 06/02/2021  8:57 AM EST ----- Normal or stable result  Stable BMP  I drew the vitamin D at her request she needs to discuss the results with Dr. Zigmund Daniel

## 2021-06-05 ENCOUNTER — Telehealth: Payer: Self-pay | Admitting: *Deleted

## 2021-06-05 NOTE — Telephone Encounter (Signed)
   Telephone encounter was:  Unsuccessful.  06/05/2021 Name: Molly Parrish MRN: [id number] DOB: [birth year]  Unsuccessful outbound call made today to assist with:  Transportation Needs   Outreach Attempt:  2nd Attempt  A HIPAA compliant voice message was left requesting a return call.  Instructed patient to call back at   Instructed patient to call back at [phone]  at their earliest convenience.   .  [place], Care Management  [phone] [address] , [place] [address] Email : [email or web address]

## 2021-06-12 ENCOUNTER — Other Ambulatory Visit: Payer: Self-pay | Admitting: Family Medicine

## 2021-06-14 ENCOUNTER — Other Ambulatory Visit: Payer: Self-pay | Admitting: Family Medicine

## 2021-06-16 ENCOUNTER — Telehealth: Payer: Self-pay | Admitting: *Deleted

## 2021-06-16 ENCOUNTER — Other Ambulatory Visit: Payer: Self-pay

## 2021-06-16 DIAGNOSIS — K909 Intestinal malabsorption, unspecified: Secondary | ICD-10-CM

## 2021-06-16 DIAGNOSIS — E559 Vitamin D deficiency, unspecified: Secondary | ICD-10-CM

## 2021-06-16 DIAGNOSIS — K7030 Alcoholic cirrhosis of liver without ascites: Secondary | ICD-10-CM

## 2021-06-16 NOTE — Telephone Encounter (Signed)
   Telephone encounter was:  Unsuccessful.  06/16/2021 Name: Molly Parrish MRN: [id number] DOB: [birth year]  Unsuccessful outbound call made today to assist with:   Dental resources  Outreach Attempt:  3rd Attempt.  Referral closed unable to contact patient.  A HIPAA compliant voice message was left requesting a return call.  Instructed patient to call back at   Instructed patient to call back at [phone]  at their earliest convenience. .  [place], Care Management  [phone] [address] , [place] [address] Email : [email or web address]

## 2021-06-20 ENCOUNTER — Telehealth: Payer: Self-pay

## 2021-06-20 ENCOUNTER — Ambulatory Visit: Payer: 59

## 2021-06-20 ENCOUNTER — Other Ambulatory Visit: Payer: Self-pay | Admitting: Family Medicine

## 2021-06-20 DIAGNOSIS — R7989 Other specified abnormal findings of blood chemistry: Secondary | ICD-10-CM

## 2021-06-20 MED ORDER — TRIAMCINOLONE ACETONIDE 0.1 % MT PSTE: 1.0000 "application " | PASTE | Freq: Two times a day (BID) | OROMUCOSAL | 12 refills | Status: DC

## 2021-06-20 NOTE — Telephone Encounter (Signed)
Done

## 2021-06-20 NOTE — Telephone Encounter (Signed)
Molly Parrish called requesting oral dental paste for ulcers on her tongue and inside her cheek which has been going on for about 2 weeks.  She reports she has used the dental paste in the past with goo results.

## 2021-06-20 NOTE — Progress Notes (Deleted)
NEUROLOGY FOLLOW UP OFFICE NOTE  Molly Parrish [id number]  Assessment/Plan:   1.  Transient ischemic attack, recurrent 2.  Unsteady gait/balance disorder.  May be multifactorial due to underlying peripheral neuropathy (possibly from long-term alcohol abuse) and congenital feet deformities.  Based on MRI, unlikely to be related to cerebrovascular disease.  She does exhibit hyperreflexia and Hoffman sign.  Therefore, I want to rule out possible myelopathy due to cervical spinal stenosis or disc herniation.  1.  Secondary stroke prevention as managed by PCP:             -  Recommend starting ASA 81mg  daily for secondary stroke prevention if no contraindication (GI workup reportedly unremarkable).             -  LDL goal less than 70.  Consider statin.             -  Glycemic control.  Hgb A1c goal less than 7.             -  Blood pressure control 2.  MRI of cervical spine without contrast.  Further recommendations pending results. 3.  Follow up in 6 months.  Subjective:  Molly Parrish. Molly Parrish is a 55 year old right-handed female with alcoholic cirrhosis, Korsakoff syndrome, portal hypertension, Bipolar disorder and generalized anxiety disorder who follows up for TIAs and unsteady gait.  UPDATE: Last seen in October 2021. Due to gait instability, MRI of cervical spine was ordered to evaluate for myelopathy.  She did not reach out to have the MRI performed until April 2022.  MRI of cervical spine on 11/12/2020 personally reviewed showed multilevel degenerative changes with mild spinal canal stenosis at C4-5 and C5-6.  I had referred her to neurosurgery ***   HISTORY: She was admitted to [facility] from 02/04/2020 to 02/07/2020 for bilateral community-acquired pneumonia, chronic lower GI bleed and questionable TIA.  She presented with shortness of breath and  left sided facial numbness and tingling and left upper extremity numbness and weakness and left lower extremity numbness.  She had  trouble speaking as well.  CT of head showed no acute intracranial abnormality.  Follow up MRI of the brain showed minimal chronic small vessel ischemic changes and remote lacunar infarct within the left basal ganglia..  CTA of head and neck showed no large vessel occlusion or hemodynamically significant stenosis.  2D echocardiogram showed normal EF with no cardiac source of emboli.  Telemetry revealed no atrial fibrillation.  She had a Hgb of 6.9 and received 2 units of packed RBC.  For pneumonia, she was treated with Rocephin and discharged on Zithromax and continued on home Omnicef.  She had a follow up outpatient colonoscopy and endoscopy which revealed no bleeding or ulcers.     She has a history of 3 prior TIAs.  However, she never took any antiplatelet therapy.  She is not on a statin.  She says her cholesterol is good.  She is on antihypertensives for portal hypertension.  She monitors her blood pressure daily.  It usually runs around 112/60.     Since these TIAs, she continues to have lower extremity weakness and balance problems.  She uses a cane for a while, even before the last TIA.  She reports increased falls.  She has numbness in the feet.  PAST MEDICAL HISTORY: Past Medical History:  Diagnosis Date   Alcohol dependence in remission ([place]) 9/93/7169   Alcoholic cirrhosis of liver (HCC)    Anemia  Anemia of chronic disease 08/08/2016   Arterial hypotension 05/08/2020   Bipolar disorder current episode depressed ([place])    Brain TIA 02/19/2020   CKD (chronic kidney disease)    Episodic mood disorder (HCC) 11/07/2013   Generalized anxiety disorder    Generalized weakness 07/11/2020   GERD (gastroesophageal reflux disease)    H/O bariatric surgery 06/11/2020   Hypocalcemia 12/10/2015   Hypokalemia    Hyponatremia 02/19/2020   Intestinal malabsorption    Iron deficiency anemia due to chronic blood loss 05/08/2020   Korsakoff syndrome ([place])    Lumbar disc disease 10/19/2019   Microscopic  hematuria 04/16/2021   Migraine 04/20/2014   Mild recurrent major depression ([place]) 04/19/2020   Multiple closed fractures of ribs of left side 06/03/2020   Formatting of this note might be different from the original. Added automatically from request for surgery [id number]   Neuropathy 01/31/2020   Obsessive-compulsive disorder 11/07/2013   Other osteoporosis without current pathological fracture    Peripheral edema 11/01/2020   Polypharmacy 06/12/2020   Portal hypertension ([place])    Post-surgical hypothyroidism 12/10/2015   Primary insomnia 03/26/2021   Severe protein-calorie malnutrition ([place]) 03/26/2021   Shortness of breath 11/01/2020   Stage 3b chronic kidney disease ([place]) 04/16/2021   Stroke ([place])     left basal ganglia stroke (hemorrhagic)   Telogen effluvium 03/26/2021   Ulcer of left foot with fat layer exposed ([place]) 10/04/2020   Ulcer of right foot, with fat layer exposed ([place]) 10/10/2020   Vitamin D deficiency     MEDICATIONS: Current Outpatient Medications on File Prior to Visit  Medication Sig Dispense Refill   ALPRAZolam (XANAX) 0.25 MG tablet TAKE 1 TABLET BY MOUTH TWICE DAILY 60 tablet 2   Cyanocobalamin (VITAMIN B 12 PO) Take 1,000 mcg by mouth daily.     famotidine (PEPCID) 40 MG tablet Take 40 mg by mouth at bedtime.     furosemide (LASIX) 40 MG tablet TAKE 2 TABLETS BY MOUTH TWICE DAILY 360 tablet 1   hydrOXYzine (VISTARIL) 25 MG capsule TAKE 1 CAPSULE(25 MG) BY MOUTH THREE TIMES DAILY 90 capsule 2   Iron-FA-B Cmp-C-Biot-Probiotic (FUSION PLUS) CAPS Take 1 capsule by mouth 2 (two) times daily.     Iron-Vitamins (GERITOL COMPLETE) TABS Take 1 tablet by mouth daily.     lamoTRIgine (LAMICTAL) 200 MG tablet Take 200 mg by mouth at bedtime.     Lemborexant (DAYVIGO) 10 MG TABS Take 10 mg by mouth at bedtime. 30 tablet 2   levothyroxine (SYNTHROID) 200 MCG tablet Take 200 mcg by mouth daily. M-F and takes 1/2 tablet by mouth Sat and Sun     magnesium oxide (MAG-OX) 400 MG tablet Take  400 mg by mouth daily.     MINOXIDIL, TOPICAL, 5 % SOLN Apply 1 mL topically in the morning and at bedtime. 120 mL 2   omeprazole (PRILOSEC) 40 MG capsule Take 1 capsule by mouth at bedtime.     promethazine (PHENERGAN) 25 MG tablet TAKE 1 TABLET BY MOUTH FOUR TIMES DAILY AS NEEDED FOR NAUSEA OR VOMITING 40 tablet 2   propranolol (INDERAL) 10 MG tablet Take 10 mg by mouth at bedtime.     QUEtiapine (SEROQUEL) 50 MG tablet Take 100 mg by mouth at bedtime.     rifaximin (XIFAXAN) 550 MG TABS tablet Take 550 mg by mouth 2 (two) times daily.     sertraline (ZOLOFT) 100 MG tablet Take 100 mg by mouth in the morning and at bedtime.  spironolactone (ALDACTONE) 50 MG tablet Take 1 tablet (50 mg total) by mouth 2 (two) times daily. 60 tablet 0   thiamine 100 MG tablet Take 100 mg by mouth daily.     traMADol (ULTRAM) 50 MG tablet TAKE 2 TABLETS BY MOUTH TWICE DAILY FOR BACK PAIN 120 tablet 0   No current facility-administered medications on file prior to visit.    ALLERGIES: Allergies  Allergen Reactions   Cholestyramine     Rash and itching   Trintellix [Vortioxetine]     FAMILY HISTORY: Family History  Problem Relation Age of Onset   Osteoporosis Mother    Cancer Mother        Breast, lung, skin.       Objective:  *** General: No acute distress.  Patient appears ***-groomed.   Head:  Normocephalic/atraumatic Eyes:  Fundi examined but not visualized Neck: supple, no paraspinal tenderness, full range of motion Heart:  Regular rate and rhythm Lungs:  Clear to auscultation bilaterally Back: No paraspinal tenderness Neurological Exam: alert and oriented to person, place, and time.  Speech fluent and not dysarthric, language intact.  CN II-XII intact. Bulk and tone normal, muscle strength 5/5 throughout.  Sensation to light touch intact.  Deep tendon reflexes 2+ throughout, toes downgoing.  Finger to nose testing intact.  Gait normal, Romberg negative.   Metta Clines, DO  CC:  ***

## 2021-06-21 LAB — T4, FREE: Free T4: 1.34 ng/dL (ref 0.82–1.77)

## 2021-06-21 LAB — TSH: TSH: 0.444 u[IU]/mL — ABNORMAL LOW (ref 0.450–4.500)

## 2021-06-23 ENCOUNTER — Ambulatory Visit: Payer: Medicare Other | Admitting: Neurology

## 2021-06-23 NOTE — Progress Notes (Signed)
Recommend change prescription to 175 mcg once daily.  Recheck in 6 weeks. Tsh and free T4.  kc

## 2021-06-24 ENCOUNTER — Other Ambulatory Visit: Payer: Self-pay

## 2021-06-24 DIAGNOSIS — E890 Postprocedural hypothyroidism: Secondary | ICD-10-CM

## 2021-06-24 MED ORDER — LEVOTHYROXINE SODIUM 175 MCG PO TABS: 175.0000 ug | ORAL_TABLET | Freq: Every day | ORAL | 2 refills | Status: DC

## 2021-06-29 ENCOUNTER — Other Ambulatory Visit: Payer: Self-pay | Admitting: Physician Assistant

## 2021-06-30 ENCOUNTER — Other Ambulatory Visit: Payer: Self-pay | Admitting: Family Medicine

## 2021-07-01 ENCOUNTER — Telehealth: Payer: Self-pay

## 2021-07-01 ENCOUNTER — Telehealth: Payer: Medicare Other

## 2021-07-01 NOTE — Telephone Encounter (Signed)
°  Care Management   Follow Up Note   07/01/2021 Name: Molly Parrish MRN: [id number] DOB: 1966-02-14   Referred by: Rochel Brome, MD Reason for referral : Chronic Care Management   An unsuccessful telephone outreach was attempted today. The patient was referred to the case management team for assistance with care management and care coordination.   Placed call to patient for scheduled appointment. No answer. Left a message requesting a call back.  Follow Up Plan: The care management team will reach out to the patient again over the next 7 days.   Tomasa Rand RN, BSN, CEN RN Case Freight forwarder - [employer] exhibitions officer Mobile: [phone]

## 2021-07-03 NOTE — Progress Notes (Signed)
Subjective:  Patient ID: Molly Parrish, female    DOB: 08/31/1965  Age: 55 y.o. MRN: [id number]  Chief Complaint  Patient presents with   Hypothyroidism   Vitamin D deficiency    HPI: Hypothyroidism: She takes levothyroxine 175 mcg daily.  Vitamin D deficiency: She is currently taking Vitamin D 50,000 units weekly.  Liver cirrhosis with portal hypertension secondary to alcohol: spironolactone 50 mg 1 p.o. twice daily, thiamine 100 mg daily, Xifaxan 550 mg 1 p.o. twice daily, propranolol 10 mg nightly.  Has been stable.  Sees Molly Parrish.  Anemia of chronic disease: Patient has thorough Parrish work-up which was negative.  Currently on fusion plus.  Chronic back pain: Currently on tramadol 50 mg 2 tablets twice daily.  Postsurgical hypothyroidism: Currently on Synthroid 175 mcg daily.  Follows with endocrinology Dr. Tamala Parrish.  Hyponatremia: This is been a chronic issue for the patient.  Patient is currently seeing nephrology.  Current Outpatient Medications on File Prior to Visit  Medication Sig Dispense Refill   ergocalciferol (VITAMIN D2) 1.25 MG (50000 UT) capsule Take by mouth.     ALPRAZolam (XANAX) 0.25 MG tablet TAKE 1 TABLET BY MOUTH TWICE DAILY 60 tablet 2   Cyanocobalamin (VITAMIN B 12 PO) Take 1,000 mcg by mouth daily.     famotidine (PEPCID) 40 MG tablet Take 40 mg by mouth at bedtime.     furosemide (LASIX) 40 MG tablet TAKE 2 TABLETS BY MOUTH TWICE DAILY (Patient taking differently: 40 mg. 1 tablet twice a day) 360 tablet 1   hydrOXYzine (VISTARIL) 25 MG capsule TAKE 1 CAPSULE(25 MG) BY MOUTH THREE TIMES DAILY 90 capsule 2   Iron-FA-B Cmp-C-Biot-Probiotic (FUSION PLUS) CAPS Take 1 capsule by mouth 2 (two) times daily.     Iron-Vitamins (GERITOL COMPLETE) TABS Take 1 tablet by mouth daily.     lamoTRIgine (LAMICTAL) 200 MG tablet Take 200 mg by mouth at bedtime.     levothyroxine (SYNTHROID) 175 MCG tablet Take 1 tablet (175 mcg total) by mouth daily. 30 tablet 2    magnesium oxide (MAG-OX) 400 MG tablet Take 400 mg by mouth daily.     MINOXIDIL, TOPICAL, 5 % SOLN Apply 1 mL topically in the morning and at bedtime. 120 mL 2   omeprazole (PRILOSEC) 40 MG capsule Take 1 capsule by mouth at bedtime.     promethazine (PHENERGAN) 25 MG tablet TAKE 1 TABLET BY MOUTH FOUR TIMES DAILY AS NEEDED FOR NAUSEA OR VOMITING 40 tablet 2   propranolol (INDERAL) 10 MG tablet Take 10 mg by mouth at bedtime.     QUEtiapine (SEROQUEL) 50 MG tablet Take 100 mg by mouth at bedtime.     rifaximin (XIFAXAN) 550 MG TABS tablet Take 550 mg by mouth 2 (two) times daily.     sertraline (ZOLOFT) 100 MG tablet Take 100 mg by mouth in the morning and at bedtime.     spironolactone (ALDACTONE) 50 MG tablet Take 1 tablet (50 mg total) by mouth 2 (two) times daily. 60 tablet 0   thiamine 100 MG tablet Take 100 mg by mouth daily.     traMADol (ULTRAM) 50 MG tablet TAKE 2 TABLETS BY MOUTH TWICE DAILY FOR BACK PAIN 120 tablet 0   triamcinolone (KENALOG) 0.1 % paste Use as directed 1 application in the mouth or throat 2 (two) times daily. 5 g 12   No current facility-administered medications on file prior to visit.   Past Medical History:  Diagnosis Date  Alcohol dependence in remission ([place]) 1/95/0932   Alcoholic cirrhosis of liver (HCC)    Anemia    Anemia of chronic disease 08/08/2016   Arterial hypotension 05/08/2020   Bipolar disorder current episode depressed ([place])    Brain TIA 02/19/2020   CKD (chronic kidney disease)    Episodic mood disorder (HCC) 11/07/2013   Generalized anxiety disorder    Generalized weakness 07/11/2020   GERD (gastroesophageal reflux disease)    H/O bariatric surgery 06/11/2020   Hypocalcemia 12/10/2015   Hypokalemia    Hyponatremia 02/19/2020   Intestinal malabsorption    Iron deficiency anemia due to chronic blood loss 05/08/2020   Korsakoff syndrome ([place])    Lumbar disc disease 10/19/2019   Microscopic hematuria 04/16/2021   Migraine 04/20/2014   Mild  recurrent major depression ([place]) 04/19/2020   Multiple closed fractures of ribs of left side 06/03/2020   Formatting of this note might be different from the original. Added automatically from request for surgery [id number]   Neuropathy 01/31/2020   Obsessive-compulsive disorder 11/07/2013   Other osteoporosis without current pathological fracture    Peripheral edema 11/01/2020   Polypharmacy 06/12/2020   Portal hypertension ([place])    Post-surgical hypothyroidism 12/10/2015   Primary insomnia 03/26/2021   Severe protein-calorie malnutrition ([place]) 03/26/2021   Shortness of breath 11/01/2020   Stage 3b chronic kidney disease ([place]) 04/16/2021   Stroke ([place])     left basal ganglia stroke (hemorrhagic)   Telogen effluvium 03/26/2021   Ulcer of left foot with fat layer exposed ([place]) 10/04/2020   Ulcer of right foot, with fat layer exposed ([place]) 10/10/2020   Vitamin D deficiency    Past Surgical History:  Procedure Laterality Date   CATARACT EXTRACTION     GASTRIC BYPASS  2005   HEMORRHOID SURGERY     HERNIA REPAIR     metatarsus abductus surgery Left 1990   PARTIAL HYSTERECTOMY  05/1997   BL Ovaries remain. Done for fibroids.   THYROIDECTOMY  03/2013    Family History  Problem Relation Age of Onset   Osteoporosis Mother    Cancer Mother        Breast, lung, skin.    Social History   Socioeconomic History   Marital status: Married    Spouse name: Not on file   Number of children: 1   Years of education: Not on file   Highest education level: Not on file  Occupational History   Occupation: Engineer, maintenance (IT)    Comment: Retired.  Tobacco Use   Smoking status: Never   Smokeless tobacco: Never  Substance and Sexual Activity   Alcohol use: Not Currently    Comment: history of alcoholism. sober since 2014.   Drug use: Never   Sexual activity: Yes  Other Topics Concern   Not on file  Social History Narrative   Right handed   Social Determinants of Health   Financial Resource Strain: Low Risk     Difficulty of Paying Living Expenses: Not hard at all  Food Insecurity: No Food Insecurity   Worried About Charity fundraiser in the Last Year: Never true   [place] in the Last Year: Never true  Transportation Needs: No Transportation Needs   Lack of Transportation (Medical): No   Lack of Transportation (Non-Medical): No  Physical Activity: Not on file  Stress: Not on file  Social Connections: Not on file    Review of Systems  Constitutional:  Positive for fatigue. Negative for chills and  fever.  HENT:  Negative for congestion, ear pain and sore throat.   Eyes:  Positive for visual disturbance.  Respiratory:  Positive for shortness of breath. Negative for cough.   Cardiovascular:  Negative for chest pain and palpitations.  Gastrointestinal:  Positive for nausea. Negative for abdominal pain, constipation, diarrhea and vomiting.  Endocrine: Positive for polydipsia and polyphagia. Negative for polyuria.  Genitourinary:  Negative for difficulty urinating and dysuria.  Musculoskeletal:  Positive for back pain. Negative for arthralgias and myalgias.  Skin:  Negative for rash.  Neurological:  Positive for weakness and headaches.  Psychiatric/Behavioral:  Negative for dysphoric mood. The patient is not nervous/anxious.     Objective:  BP 104/68    Pulse 88    Temp 97.8 F (36.6 C)    Resp 16    Ht 5\' 6"  (1.676 m)    Wt 133 lb (60.3 kg)    BMI 21.47 kg/m   BP/Weight 07/25/2021 07/04/2021 75/64/3329  Systolic BP 518 841 98  Diastolic BP 60 68 52  Wt. (Lbs) 136 133 141.6  BMI 21.95 21.47 22.85    Physical Exam Vitals reviewed.  Constitutional:      Appearance: Normal appearance. She is normal weight.  Neck:     Vascular: No carotid bruit.  Cardiovascular:     Rate and Rhythm: Normal rate and regular rhythm.     Heart sounds: Normal heart sounds.  Pulmonary:     Effort: Pulmonary effort is normal. No respiratory distress.     Breath sounds: Normal breath sounds.   Abdominal:     General: Abdomen is flat. Bowel sounds are normal.     Palpations: Abdomen is soft.     Tenderness: There is no abdominal tenderness.  Skin:    Comments: Telogen effluvium   Neurological:     Mental Status: She is alert and oriented to person, place, and time.  Psychiatric:        Mood and Affect: Mood normal.        Behavior: Behavior normal.    Diabetic Foot Exam - Simple   No data filed      Lab Results  Component Value Date   WBC 6.5 07/04/2021   HGB 9.9 (L) 07/04/2021   HCT 31.1 (L) 07/04/2021   PLT 322 07/04/2021   GLUCOSE 79 07/04/2021   CHOL 137 05/08/2020   TRIG 55 05/08/2020   HDL 93 05/08/2020   LDLCALC 32 05/08/2020   ALT 21 07/04/2021   AST 20 07/04/2021   NA 137 07/04/2021   K 3.5 07/04/2021   CL 97 07/04/2021   CREATININE 2.05 (H) 07/04/2021   BUN 43 (H) 07/04/2021   CO2 24 07/04/2021   TSH 0.444 (L) 06/20/2021      Assessment & Plan:   Problem List Items Addressed This Visit       Digestive   Alcoholic cirrhosis of liver ([place])    Follow-up Don Bullae, PAC. Molly Parrish. Continue spironolactone 50 mg 1 p.o. twice daily, thiamine 100 mg daily, Xifaxan 550 mg 1 p.o. twice daily, propranolol 10 mg nightly.      Relevant Orders   Comprehensive metabolic panel (Completed)     Endocrine   Post-surgical hypothyroidism - Primary    Patient is taking levothyroxine 175 mcg daily.  The current medical regimen is effective;  continue present plan and medications.        Musculoskeletal and Integument   Other osteoporosis without current pathological fracture    The  current medical regimen is effective;  continue present plan and medications.      Relevant Medications   ergocalciferol (VITAMIN D2) 1.25 MG (50000 UT) capsule     Genitourinary   Stage 3b chronic kidney disease (HCC)    Stable.  Follow-up with nephrology.        Other   Alcohol dependence in remission ([place])    Continued abstinence      Anemia of chronic  disease    Stable.  Continue to monitor hemoglobin every 3 months.  Patient sees nephrology.  Patient also sees gastroenterology      Relevant Orders   CBC with Differential/Platelet (Completed)   Obsessive-compulsive disorder    The current medical regimen is effective;  continue present plan and medications. Follow-up with psychiatry      Generalized anxiety disorder    Continue current medications.  Follow-up with psychiatry.      Encounter for screening mammogram for malignant neoplasm of breast    Ordered Screening Bilateral mammogram.      Relevant Orders   MM DIGITAL SCREENING BILATERAL   Primary insomnia   Vitamin D deficiency   Relevant Orders   VITAMIN D 25 Hydroxy (Vit-D Deficiency, Fractures) (Completed)  .  No orders of the defined types were placed in this encounter.    Orders Placed This Encounter  Procedures   MM DIGITAL SCREENING BILATERAL   Comprehensive metabolic panel   CBC with Differential/Platelet   VITAMIN D 25 Hydroxy (Vit-D Deficiency, Fractures)   Deatra Robinson Borjas acting as a scribe for Rochel Brome, MD has helped with documentation on behalf of Rochel Brome, MD,as directed by  Rochel Brome, MD while in the presence of Rochel Brome, MD.    Follow-up: Return in about 3 months (around 10/02/2021) for chronic follow up.  An After Visit Summary was printed and given to the patient.  Rochel Brome, MD [facility] [phone]

## 2021-07-04 ENCOUNTER — Ambulatory Visit (INDEPENDENT_AMBULATORY_CARE_PROVIDER_SITE_OTHER): Payer: 59 | Admitting: Family Medicine

## 2021-07-04 ENCOUNTER — Encounter: Payer: Self-pay | Admitting: Family Medicine

## 2021-07-04 ENCOUNTER — Other Ambulatory Visit: Payer: Self-pay

## 2021-07-04 ENCOUNTER — Ambulatory Visit (INDEPENDENT_AMBULATORY_CARE_PROVIDER_SITE_OTHER): Payer: 59

## 2021-07-04 VITALS — BP 104/68 | HR 88 | Temp 97.8°F | Resp 16 | Ht 66.0 in | Wt 133.0 lb

## 2021-07-04 DIAGNOSIS — Z23 Encounter for immunization: Secondary | ICD-10-CM

## 2021-07-04 DIAGNOSIS — Z1231 Encounter for screening mammogram for malignant neoplasm of breast: Secondary | ICD-10-CM

## 2021-07-04 DIAGNOSIS — F428 Other obsessive-compulsive disorder: Secondary | ICD-10-CM

## 2021-07-04 DIAGNOSIS — M818 Other osteoporosis without current pathological fracture: Secondary | ICD-10-CM

## 2021-07-04 DIAGNOSIS — N1832 Chronic kidney disease, stage 3b: Secondary | ICD-10-CM

## 2021-07-04 DIAGNOSIS — D638 Anemia in other chronic diseases classified elsewhere: Secondary | ICD-10-CM

## 2021-07-04 DIAGNOSIS — E559 Vitamin D deficiency, unspecified: Secondary | ICD-10-CM

## 2021-07-04 DIAGNOSIS — F1021 Alcohol dependence, in remission: Secondary | ICD-10-CM

## 2021-07-04 DIAGNOSIS — F411 Generalized anxiety disorder: Secondary | ICD-10-CM

## 2021-07-04 DIAGNOSIS — F5101 Primary insomnia: Secondary | ICD-10-CM

## 2021-07-04 DIAGNOSIS — E890 Postprocedural hypothyroidism: Secondary | ICD-10-CM | POA: Diagnosis not present

## 2021-07-04 DIAGNOSIS — K7030 Alcoholic cirrhosis of liver without ascites: Secondary | ICD-10-CM

## 2021-07-04 NOTE — Progress Notes (Signed)
° °  Covid-19 Vaccination Clinic  Name:  Molly Parrish    MRN: [id number] DOB: [birth year]  07/04/2021  [name] was observed post Covid-19 immunization for 15 minutes without incident. She was provided with Vaccine Information Sheet and instruction to access the V-Safe system.   [name] was instructed to call 911 with any severe reactions post vaccine: Difficulty breathing  Swelling of face and throat  A fast heartbeat  A bad rash all over body  Dizziness and weakness   Immunizations Administered     Name Date Dose VIS Date Route   Pfizer Covid-19 Vaccine Bivalent Booster 07/04/2021 10:11 AM 0.3 mL 03/19/2021 Intramuscular   Manufacturer: [place]   Lot: [id number]   [place]: [phone]

## 2021-07-05 LAB — CBC WITH DIFFERENTIAL/PLATELET
Basophils Absolute: 0.1 10*3/uL (ref 0.0–0.2)
Basos: 1 %
EOS (ABSOLUTE): 0.1 10*3/uL (ref 0.0–0.4)
Eos: 2 %
Hematocrit: 31.1 % — ABNORMAL LOW (ref 34.0–46.6)
Hemoglobin: 9.9 g/dL — ABNORMAL LOW (ref 11.1–15.9)
Immature Grans (Abs): 0 10*3/uL (ref 0.0–0.1)
Immature Granulocytes: 1 %
Lymphocytes Absolute: 1.2 10*3/uL (ref 0.7–3.1)
Lymphs: 18 %
MCH: 27.7 pg (ref 26.6–33.0)
MCHC: 31.8 g/dL (ref 31.5–35.7)
MCV: 87 fL (ref 79–97)
Monocytes Absolute: 0.5 10*3/uL (ref 0.1–0.9)
Monocytes: 8 %
Neutrophils Absolute: 4.6 10*3/uL (ref 1.4–7.0)
Neutrophils: 70 %
Platelets: 322 10*3/uL (ref 150–450)
RBC: 3.58 x10E6/uL — ABNORMAL LOW (ref 3.77–5.28)
RDW: 13.7 % (ref 11.7–15.4)
WBC: 6.5 10*3/uL (ref 3.4–10.8)

## 2021-07-05 LAB — COMPREHENSIVE METABOLIC PANEL
ALT: 21 IU/L (ref 0–32)
AST: 20 IU/L (ref 0–40)
Albumin/Globulin Ratio: 1.6 (ref 1.2–2.2)
Albumin: 3.7 g/dL — ABNORMAL LOW (ref 3.8–4.9)
Alkaline Phosphatase: 220 IU/L — ABNORMAL HIGH (ref 44–121)
BUN/Creatinine Ratio: 21 (ref 9–23)
BUN: 43 mg/dL — ABNORMAL HIGH (ref 6–24)
Bilirubin Total: 0.3 mg/dL (ref 0.0–1.2)
CO2: 24 mmol/L (ref 20–29)
Calcium: 9.5 mg/dL (ref 8.7–10.2)
Chloride: 97 mmol/L (ref 96–106)
Creatinine, Ser: 2.05 mg/dL — ABNORMAL HIGH (ref 0.57–1.00)
Globulin, Total: 2.3 g/dL (ref 1.5–4.5)
Glucose: 79 mg/dL (ref 70–99)
Potassium: 3.5 mmol/L (ref 3.5–5.2)
Sodium: 137 mmol/L (ref 134–144)
Total Protein: 6 g/dL (ref 6.0–8.5)
eGFR: 28 mL/min/{1.73_m2} — ABNORMAL LOW (ref >59)

## 2021-07-05 LAB — VITAMIN D 25 HYDROXY (VIT D DEFICIENCY, FRACTURES): Vit D, 25-Hydroxy: 38.1 ng/mL (ref 30.0–100.0)

## 2021-07-06 DIAGNOSIS — Z1231 Encounter for screening mammogram for malignant neoplasm of breast: Secondary | ICD-10-CM | POA: Insufficient documentation

## 2021-07-06 NOTE — Progress Notes (Signed)
Blood count abnormal. Anemia is stable.  Liver function normal.  Kidney function worsened.  Vitamin D normal. Fax to nephrology. I believe she has an appointment coming up with nephrology.

## 2021-07-06 NOTE — Assessment & Plan Note (Signed)
The current medical regimen is effective;  continue present plan and medications.  

## 2021-07-06 NOTE — Assessment & Plan Note (Signed)
Patient is taking levothyroxine 175 mcg daily.  The current medical regimen is effective;  continue present plan and medications.

## 2021-07-06 NOTE — Assessment & Plan Note (Addendum)
The current medical regimen is effective;  continue present plan and medications. Follow-up with psychiatry

## 2021-07-06 NOTE — Assessment & Plan Note (Signed)
Ordered Screening Bilateral mammogram.

## 2021-07-07 DIAGNOSIS — G621 Alcoholic polyneuropathy: Secondary | ICD-10-CM | POA: Insufficient documentation

## 2021-07-07 DIAGNOSIS — Q66221 Congenital metatarsus adductus, right foot: Secondary | ICD-10-CM | POA: Insufficient documentation

## 2021-07-07 DIAGNOSIS — Q6671 Congenital pes cavus, right foot: Secondary | ICD-10-CM | POA: Insufficient documentation

## 2021-07-11 DIAGNOSIS — N179 Acute kidney failure, unspecified: Secondary | ICD-10-CM | POA: Insufficient documentation

## 2021-07-14 ENCOUNTER — Other Ambulatory Visit: Payer: Self-pay | Admitting: Family Medicine

## 2021-07-15 ENCOUNTER — Other Ambulatory Visit: Payer: Self-pay | Admitting: Family Medicine

## 2021-07-24 ENCOUNTER — Ambulatory Visit (INDEPENDENT_AMBULATORY_CARE_PROVIDER_SITE_OTHER): Payer: 59

## 2021-07-24 ENCOUNTER — Telehealth: Payer: Self-pay

## 2021-07-24 DIAGNOSIS — F04 Amnestic disorder due to known physiological condition: Secondary | ICD-10-CM

## 2021-07-24 DIAGNOSIS — E43 Unspecified severe protein-calorie malnutrition: Secondary | ICD-10-CM

## 2021-07-24 DIAGNOSIS — L97512 Non-pressure chronic ulcer of other part of right foot with fat layer exposed: Secondary | ICD-10-CM

## 2021-07-24 DIAGNOSIS — L97522 Non-pressure chronic ulcer of other part of left foot with fat layer exposed: Secondary | ICD-10-CM

## 2021-07-24 DIAGNOSIS — K7030 Alcoholic cirrhosis of liver without ascites: Secondary | ICD-10-CM

## 2021-07-24 DIAGNOSIS — D638 Anemia in other chronic diseases classified elsewhere: Secondary | ICD-10-CM

## 2021-07-24 DIAGNOSIS — M818 Other osteoporosis without current pathological fracture: Secondary | ICD-10-CM

## 2021-07-24 NOTE — Chronic Care Management (AMB) (Signed)
Chronic Care Management   CCM RN Visit Note  07/24/2021 Name: Molly Parrish MRN: [id number] DOB: 09-Jul-1966  Subjective: Molly Parrish is a 56 y.o. year old female who is a primary care patient of Cox, Kirsten, MD. The care management team was consulted for assistance with disease management and care coordination needs.    Engaged with patient by telephone for follow up visit in response to provider referral for case management and/or care coordination services.   Consent to Services:  The patient was given information about Chronic Care Management services, agreed to services, and gave verbal consent prior to initiation of services.  Please see initial visit note for detailed documentation.   Patient agreed to services and verbal consent obtained.   Assessment: Review of patient past medical history, allergies, medications, health status, including review of consultants reports, laboratory and other test data, was performed as part of comprehensive evaluation and provision of chronic care management services.   SDOH (Social Determinants of Health) assessments and interventions performed:    CCM Care Plan  Allergies  Allergen Reactions   Cholestyramine     Rash and itching   Trintellix [Vortioxetine]     Outpatient Encounter Medications as of 07/24/2021  Medication Sig   ALPRAZolam (XANAX) 0.25 MG tablet TAKE 1 TABLET BY MOUTH TWICE DAILY   Cyanocobalamin (VITAMIN B 12 PO) Take 1,000 mcg by mouth daily.   DAYVIGO 10 MG TABS TAKE 1 TABLET BY MOUTH AT BEDTIME   ergocalciferol (VITAMIN D2) 1.25 MG (50000 UT) capsule Take by mouth.   famotidine (PEPCID) 40 MG tablet Take 40 mg by mouth at bedtime.   furosemide (LASIX) 40 MG tablet TAKE 2 TABLETS BY MOUTH TWICE DAILY (Patient taking differently: 40 mg. 1 tablet twice a day)   hydrOXYzine (VISTARIL) 25 MG capsule TAKE 1 CAPSULE(25 MG) BY MOUTH THREE TIMES DAILY   Iron-Vitamins (GERITOL COMPLETE) TABS Take 1 tablet by mouth daily.    lamoTRIgine (LAMICTAL) 200 MG tablet Take 200 mg by mouth at bedtime.   levothyroxine (SYNTHROID) 175 MCG tablet Take 1 tablet (175 mcg total) by mouth daily.   magnesium oxide (MAG-OX) 400 MG tablet Take 400 mg by mouth daily.   MINOXIDIL, TOPICAL, 5 % SOLN Apply 1 mL topically in the morning and at bedtime.   omeprazole (PRILOSEC) 40 MG capsule Take 1 capsule by mouth at bedtime.   promethazine (PHENERGAN) 25 MG tablet TAKE 1 TABLET BY MOUTH FOUR TIMES DAILY AS NEEDED FOR NAUSEA OR VOMITING   propranolol (INDERAL) 10 MG tablet Take 10 mg by mouth at bedtime.   QUEtiapine (SEROQUEL) 50 MG tablet Take 100 mg by mouth at bedtime.   rifaximin (XIFAXAN) 550 MG TABS tablet Take 550 mg by mouth 2 (two) times daily.   sertraline (ZOLOFT) 100 MG tablet Take 100 mg by mouth in the morning and at bedtime.   sevelamer carbonate (RENVELA) 800 MG tablet Take 800 mg by mouth 3 (three) times daily with meals.   spironolactone (ALDACTONE) 50 MG tablet Take 1 tablet (50 mg total) by mouth 2 (two) times daily.   thiamine 100 MG tablet Take 100 mg by mouth daily.   traMADol (ULTRAM) 50 MG tablet TAKE 2 TABLETS BY MOUTH TWICE DAILY FOR BACK PAIN   triamcinolone (KENALOG) 0.1 % paste Use as directed 1 application in the mouth or throat 2 (two) times daily.   Iron-FA-B Cmp-C-Biot-Probiotic (FUSION PLUS) CAPS Take 1 capsule by mouth 2 (two) times daily.   No facility-administered  encounter medications on file as of 07/24/2021.    Patient Active Problem List   Diagnosis Date Noted   Encounter for screening mammogram for malignant neoplasm of breast 07/06/2021   Stage 3b chronic kidney disease ([place]) 04/16/2021   Primary insomnia 03/26/2021   Telogen effluvium 03/26/2021   Severe protein-calorie malnutrition ([place]) 03/26/2021   Vitamin D deficiency    GERD (gastroesophageal reflux disease)    CKD (chronic kidney disease)    Bipolar disorder current episode depressed ([place])    Anemia    Peripheral edema  11/01/2020   Ulcer of right foot, with fat layer exposed ([place]) 10/10/2020   Ulcer of left foot with fat layer exposed ([place]) 10/04/2020   Multiple closed fractures of ribs of left side 06/03/2020   Iron deficiency anemia due to chronic blood loss 05/08/2020   Arterial hypotension 05/08/2020   Mild recurrent major depression ([place]) 04/19/2020   Generalized anxiety disorder 04/19/2020   Neuropathy 01/31/2020   Lumbar disc disease 10/19/2019   Korsakoff syndrome ([place])    Other osteoporosis without current pathological fracture    Portal hypertension ([place])    Alcoholic cirrhosis of liver (HCC)    Anemia of chronic disease 08/08/2016   Hypocalcemia 12/10/2015   Post-surgical hypothyroidism 12/10/2015   Migraine 04/20/2014   Stroke ([place]) 04/20/2014   Obsessive-compulsive disorder 11/07/2013   Alcohol dependence in remission ([place]) 10/04/2013    Conditions to be addressed/monitored:CKD Stage 3 and anemia, cirrhosis, wound on foot and foot deformity, falls   Care Plan : RN Care Manager plan of care  Updates made by Thana Ates, RN since 07/24/2021 12:00 AM     Problem: No plan of care established for management of chronic disease states ( cirrhosis, CKD stage 3, falls, foot ulcer and infection, anemia)   Priority: High     Long-Range Goal: Development of plan of care for chronic disease management. (cirrhosis, CKD stage 3, falls, foot ulcer and infection, anemia)   Start Date: 07/24/2021  Expected End Date: 07/24/2022  Priority: High  Note:   Current Barriers:  Chronic Disease Management support and education needs related to CKD Stage 3 and cirrhosis, anemia, chronic wound on foot, falls No Advanced Directives in place 07/24/2021  Patient reports she is having worsening kidney function. Reports change of medication ( see medication list) Reports she has had 2 falls. Reports that she now has blistered areas and warmth to her foot beside of scabbed area, Reports pending reconstruction  surgery on 08/01/2021 by Dr. Prince Rome. Has a planned ultrasound of kidneys scheduled for 1/60/2023.  Reports when she was at nephrology office her BP was 79/52. Reports that her blood pressure continues to run low. Self monitors daily.   RNCM Clinical Goal(s):  Patient will verbalize basic understanding of CKD Stage 3 and cirrhosis, anemia, chronic wound on foot, falls and disease process and self health management plan as evidenced by patient report. demonstrate improved adherence to prescribed treatment plan for CKD Stage 3, anemia chronic wound on foot  and cirrhosis as evidenced by patient report. collaborate with the care management team towards completion of advanced directives ASAP as evidenced by patient report  through collaboration with RN Care manager, provider, and care team.   Interventions: 1:1 collaboration with primary care provider regarding development and update of comprehensive plan of care as evidenced by provider attestation and co-signature Inter-disciplinary care team collaboration (see longitudinal plan of care) Evaluation of current treatment plan related to  self management and patient's adherence to  plan as established by provider   Chronic Kidney Disease (Status: New goal.)  Long Term Goal  Last practice recorded BP readings:  BP Readings from Last 3 Encounters:  07/04/21 104/68  05/30/21 (!) 98/52  04/04/21 110/62  Most recent eGFR/CrCl:  Lab Results  Component Value Date   EGFR 28 (L) 07/04/2021    No components found for: CRCL  Evaluation of current treatment plan related to chronic kidney disease self management and patient's adherence to plan as established by provider      Reviewed medications with patient and discussed importance of compliance    Encouraged patient to continue to take her BP daily and call MD for high or low readings.    Falls:  (Status: New goal.) Long Term Goal  Reviewed medications and discussed potential side effects of medications  such as dizziness and frequent urination Advised patient of importance of notifying provider of falls Assessed for signs and symptoms of orthostatic hypotension Assessed for falls since last encounter  Foot deformity and infection  (Status: New goal.) Long Term Goal  Evaluation of current treatment plan related to  foot deformity and planned surgery , self-management and patient's adherence to plan as established by provider. Discussed plans with patient for ongoing care management follow up and provided patient with direct contact information for care management team Advised patient to call Dr. Prince Rome [facility] MD who is planning to do foot surgery on 08/01/2021) to discuss new signs of infection as reported to me; Provided education to patient and/or caregiver about advanced directives; Reviewed medications with patient and discussed importance of taking all medications as prescribed; Reviewed scheduled/upcoming provider appointments including PCP, oncology and this case manager;  Oncology:  (New goal.) Long Term Goal  Assessment of understanding of anemia diagnosis:  Reviewed upcoming provider appointments and treatment appointments  Cirrhosis:  (New Goal) Long Term Assessed for changes in condition Reviewed medications Encouraged timely follow up for labs and PCP appointments  Patient Goals/Self-Care Activities: Take medications as prescribed   Attend all scheduled provider appointments Call pharmacy for medication refills 3-7 days in advance of running out of medications check blood pressure daily call doctor for signs and symptoms of high blood pressure or low blood pressure keep all doctor appointments Call Dr. Prince Rome today to report symptoms. Continue to be careful changing positions Be careful not to fall.     Plan:Telephone follow up appointment with care management team member scheduled for:  08/26/2021  at 2:15pm Tomasa Rand RN, BSN, CEN RN Case Manager - [facility] Mobile: [phone]

## 2021-07-24 NOTE — Patient Instructions (Addendum)
Visit Information  Thank you for taking time to visit with me today. Please don't hesitate to contact me if I can be of assistance to you before our next scheduled telephone appointment.  Following are the goals we discussed today:  Patient Goals/Self-Care Activities: Take medications as prescribed   Attend all scheduled provider appointments Call pharmacy for medication refills 3-7 days in advance of running out of medications check blood pressure daily call doctor for signs and symptoms of high blood pressure or low blood pressure keep all doctor appointments Call Dr. Prince Rome today to report symptoms. Continue to be careful changing positions Be careful not to fall.  Our next appointment is by telephone on 08/26/2021 at 2:15pm  Please call the care guide team at [phone] if you need to cancel or reschedule your appointment.   If you are experiencing a Mental Health or [place] or need someone to talk to, please call the Suicide and Crisis Lifeline: 988 call the [place] National Suicide Prevention Lifeline: 734-830-1279 or TTY: 865-414-7216 TTY (908)599-4998) to talk to a trained counselor call 1-800-273-TALK (toll free, 24 hour hotline) call 911   The patient verbalized understanding of instructions, educational materials, and care plan provided today and declined offer to receive copy of patient instructions, educational materials, and care plan.   Tomasa Rand RN, BSN, CEN RN Case Freight forwarder - [facility] [employer] officer Mobile: [phone]

## 2021-07-24 NOTE — Telephone Encounter (Signed)
Left a message asking for the pt to call back to see about getting an appt for tomorrow. Pt is requesting an appt for cellulitis.

## 2021-07-25 ENCOUNTER — Other Ambulatory Visit: Payer: Self-pay

## 2021-07-25 ENCOUNTER — Ambulatory Visit (INDEPENDENT_AMBULATORY_CARE_PROVIDER_SITE_OTHER): Payer: 59 | Admitting: Nurse Practitioner

## 2021-07-25 ENCOUNTER — Encounter: Payer: Self-pay | Admitting: Nurse Practitioner

## 2021-07-25 VITALS — BP 110/60 | HR 81 | Temp 96.6°F | Ht 66.0 in | Wt 136.0 lb

## 2021-07-25 DIAGNOSIS — M21961 Unspecified acquired deformity of right lower leg: Secondary | ICD-10-CM | POA: Diagnosis not present

## 2021-07-25 NOTE — Progress Notes (Signed)
Acute Office Visit  Subjective:    Patient ID: Molly Parrish, female    DOB: [birth year], 56 y.o.   MRN: [id number]  Chief Complaint  Patient presents with   Foot Pain    HPI: Patient is in today for right foot blister.Onset was four days ago. Treatment has included washing foot twice daily with soap and water, applying Vaseline, and covering foot with gauze. States she does not bear weight on right foot. She has congenital right foot deformity. She tells me she has been followed by wound clinic in the past for right foot ulcerations. She is currently followed by Dr Prince Rome, podiatry.  She is scheduled to see Dr Prince Rome on 07/28/21 for surgical reconstruction of right foot to prevent future ulcerations to 5th metatarsal.  Past Medical History:  Diagnosis Date   Alcohol dependence in remission ([place]) 10/26/8117   Alcoholic cirrhosis of liver (HCC)    Anemia    Anemia of chronic disease 08/08/2016   Arterial hypotension 05/08/2020   Bipolar disorder current episode depressed ([place])    Brain TIA 02/19/2020   CKD (chronic kidney disease)    Episodic mood disorder (HCC) 11/07/2013   Generalized anxiety disorder    Generalized weakness 07/11/2020   GERD (gastroesophageal reflux disease)    H/O bariatric surgery 06/11/2020   Hypocalcemia 12/10/2015   Hypokalemia    Hyponatremia 02/19/2020   Intestinal malabsorption    Iron deficiency anemia due to chronic blood loss 05/08/2020   Korsakoff syndrome ([place])    Lumbar disc disease 10/19/2019   Microscopic hematuria 04/16/2021   Migraine 04/20/2014   Mild recurrent major depression ([place]) 04/19/2020   Multiple closed fractures of ribs of left side 06/03/2020   Formatting of this note might be different from the original. Added automatically from request for surgery [id number]   Neuropathy 01/31/2020   Obsessive-compulsive disorder 11/07/2013   Other osteoporosis without current pathological fracture    Peripheral edema 11/01/2020   Polypharmacy  06/12/2020   Portal hypertension ([place])    Post-surgical hypothyroidism 12/10/2015   Primary insomnia 03/26/2021   Severe protein-calorie malnutrition ([place]) 03/26/2021   Shortness of breath 11/01/2020   Stage 3b chronic kidney disease ([place]) 04/16/2021   Stroke ([place])     left basal ganglia stroke (hemorrhagic)   Telogen effluvium 03/26/2021   Ulcer of left foot with fat layer exposed ([place]) 10/04/2020   Ulcer of right foot, with fat layer exposed ([place]) 10/10/2020   Vitamin D deficiency     Past Surgical History:  Procedure Laterality Date   CATARACT EXTRACTION     GASTRIC BYPASS  2005   HEMORRHOID SURGERY     HERNIA REPAIR     metatarsus abductus surgery Left 1990   PARTIAL HYSTERECTOMY  05/1997   BL Ovaries remain. Done for fibroids.   THYROIDECTOMY  03/2013    Family History  Problem Relation Age of Onset   Osteoporosis Mother    Cancer Mother        Breast, lung, skin.     Social History   Socioeconomic History   Marital status: Married    Spouse name: Not on file   Number of children: 1   Years of education: Not on file   Highest education level: Not on file  Occupational History   Occupation: Engineer, maintenance (IT)    Comment: Retired.  Tobacco Use   Smoking status: Never   Smokeless tobacco: Never  Substance and Sexual Activity   Alcohol use: Not Currently  Comment: history of alcoholism. sober since 2014.   Drug use: Never   Sexual activity: Yes  Other Topics Concern   Not on file  Social History Narrative   Right handed   Social Determinants of Health   Financial Resource Strain: Low Risk    Difficulty of Paying Living Expenses: Not hard at all  Food Insecurity: No Food Insecurity   Worried About Charity fundraiser in the Last Year: Never true   [place] in the Last Year: Never true  Transportation Needs: No Transportation Needs   Lack of Transportation (Medical): No   Lack of Transportation (Non-Medical): No  Physical Activity: Not on file  Stress: Not on file   Social Connections: Not on file  Intimate Partner Violence: Not At Risk   Fear of Current or Ex-Partner: No   Emotionally Abused: No   Physically Abused: No   Sexually Abused: No    Outpatient Medications Prior to Visit  Medication Sig Dispense Refill   ALPRAZolam (XANAX) 0.25 MG tablet TAKE 1 TABLET BY MOUTH TWICE DAILY 60 tablet 2   Cyanocobalamin (VITAMIN B 12 PO) Take 1,000 mcg by mouth daily.     DAYVIGO 10 MG TABS TAKE 1 TABLET BY MOUTH AT BEDTIME 30 tablet 2   ergocalciferol (VITAMIN D2) 1.25 MG (50000 UT) capsule Take by mouth.     famotidine (PEPCID) 40 MG tablet Take 40 mg by mouth at bedtime.     furosemide (LASIX) 40 MG tablet TAKE 2 TABLETS BY MOUTH TWICE DAILY (Patient taking differently: 40 mg. 1 tablet twice a day) 360 tablet 1   hydrOXYzine (VISTARIL) 25 MG capsule TAKE 1 CAPSULE(25 MG) BY MOUTH THREE TIMES DAILY 90 capsule 2   Iron-FA-B Cmp-C-Biot-Probiotic (FUSION PLUS) CAPS Take 1 capsule by mouth 2 (two) times daily.     Iron-Vitamins (GERITOL COMPLETE) TABS Take 1 tablet by mouth daily.     lamoTRIgine (LAMICTAL) 200 MG tablet Take 200 mg by mouth at bedtime.     levothyroxine (SYNTHROID) 175 MCG tablet Take 1 tablet (175 mcg total) by mouth daily. 30 tablet 2   magnesium oxide (MAG-OX) 400 MG tablet Take 400 mg by mouth daily.     MINOXIDIL, TOPICAL, 5 % SOLN Apply 1 mL topically in the morning and at bedtime. 120 mL 2   omeprazole (PRILOSEC) 40 MG capsule Take 1 capsule by mouth at bedtime.     promethazine (PHENERGAN) 25 MG tablet TAKE 1 TABLET BY MOUTH FOUR TIMES DAILY AS NEEDED FOR NAUSEA OR VOMITING 40 tablet 2   propranolol (INDERAL) 10 MG tablet Take 10 mg by mouth at bedtime.     QUEtiapine (SEROQUEL) 50 MG tablet Take 100 mg by mouth at bedtime.     rifaximin (XIFAXAN) 550 MG TABS tablet Take 550 mg by mouth 2 (two) times daily.     sertraline (ZOLOFT) 100 MG tablet Take 100 mg by mouth in the morning and at bedtime.     sevelamer carbonate (RENVELA) 800  MG tablet Take 800 mg by mouth 3 (three) times daily with meals.     spironolactone (ALDACTONE) 50 MG tablet Take 1 tablet (50 mg total) by mouth 2 (two) times daily. 60 tablet 0   thiamine 100 MG tablet Take 100 mg by mouth daily.     traMADol (ULTRAM) 50 MG tablet TAKE 2 TABLETS BY MOUTH TWICE DAILY FOR BACK PAIN 120 tablet 0   triamcinolone (KENALOG) 0.1 % paste Use as directed 1  application in the mouth or throat 2 (two) times daily. 5 g 12   No facility-administered medications prior to visit.    Allergies  Allergen Reactions   Cholestyramine     Rash and itching   Trintellix [Vortioxetine]     Review of Systems  Musculoskeletal:  Positive for arthralgias (right foot) and gait problem.       Right foot pain  Skin:        Blister right foot  All other systems reviewed and are negative.     Objective:    Physical Exam Vitals reviewed.  Musculoskeletal:     Right foot: Deformity present.  Feet:     Right foot:     Skin integrity: Blister and callus present.     Comments: Small healing blister approximately 2 mm in diameter to right 5th metatarsal, no erythema, drainage, or odor present Skin:    General: Skin is warm.     Capillary Refill: Capillary refill takes less than 2 seconds.  Neurological:     Mental Status: She is alert. Mental status is at baseline.    BP 110/60    Pulse 81    Temp (!) 96.6 F (35.9 C)    Ht $R'5\' 6"'tM$  (1.676 m)    Wt 136 lb (61.7 kg)    SpO2 99%    BMI 21.95 kg/m  Wt Readings from Last 3 Encounters:  07/25/21 136 lb (61.7 kg)  07/04/21 133 lb (60.3 kg)  05/30/21 141 lb 9.6 oz (64.2 kg)    Health Maintenance Due  Topic Date Due   HIV Screening  Never done   Hepatitis C Screening  Never done   PAP SMEAR-Modifier  Never done   MAMMOGRAM  Never done   Pneumococcal Vaccine 31-65 Years old (2 - PCV) 03/20/2014   Zoster Vaccines- Shingrix (2 of 2) 10/21/2020    Lab Results  Component Value Date   TSH 0.444 (L) 06/20/2021   Lab Results   Component Value Date   WBC 6.5 07/04/2021   HGB 9.9 (L) 07/04/2021   HCT 31.1 (L) 07/04/2021   MCV 87 07/04/2021   PLT 322 07/04/2021   Lab Results  Component Value Date   NA 137 07/04/2021   K 3.5 07/04/2021   CO2 24 07/04/2021   GLUCOSE 79 07/04/2021   BUN 43 (H) 07/04/2021   CREATININE 2.05 (H) 07/04/2021   BILITOT 0.3 07/04/2021   ALKPHOS 220 (H) 07/04/2021   AST 20 07/04/2021   ALT 21 07/04/2021   PROT 6.0 07/04/2021   ALBUMIN 3.7 (L) 07/04/2021   CALCIUM 9.5 07/04/2021   EGFR 28 (L) 07/04/2021   Lab Results  Component Value Date   CHOL 137 05/08/2020   Lab Results  Component Value Date   HDL 93 05/08/2020   Lab Results  Component Value Date   LDLCALC 32 05/08/2020   Lab Results  Component Value Date   TRIG 55 05/08/2020   Lab Results  Component Value Date   CHOLHDL 1.5 05/08/2020        Assessment & Plan:   1. Foot deformity, right  -continue follow-up with podiatrist Dr Prince Rome as scheduled -continue skin care to right foot as directed per podiatrist -follow-up as needed      Follow-up: PRN  An After Visit Summary was printed and given to the patient. I, Rip Harbour, NP, have reviewed all documentation for this visit. The documentation on 07/25/21 for the exam, diagnosis, procedures, and orders are  all accurate and complete.   Signed, Rip Harbour, NP [place] [phone]

## 2021-08-03 NOTE — Assessment & Plan Note (Signed)
Follow-up [name], PAC. [facility]. Continue spironolactone 50 mg 1 p.o. twice daily, thiamine 100 mg daily, Xifaxan 550 mg 1 p.o. twice daily, propranolol 10 mg nightly.

## 2021-08-03 NOTE — Assessment & Plan Note (Signed)
Stable.  Follow-up with nephrology.

## 2021-08-03 NOTE — Assessment & Plan Note (Signed)
Continue current medications.  Follow-up with psychiatry.

## 2021-08-03 NOTE — Assessment & Plan Note (Signed)
Stable.  Continue to monitor hemoglobin every 3 months.  Patient sees nephrology.  Patient also sees gastroenterology

## 2021-08-03 NOTE — Assessment & Plan Note (Signed)
Continued abstinence

## 2021-08-08 ENCOUNTER — Other Ambulatory Visit: Payer: 59

## 2021-08-15 ENCOUNTER — Other Ambulatory Visit: Payer: 59

## 2021-08-15 ENCOUNTER — Other Ambulatory Visit: Payer: Self-pay | Admitting: Family Medicine

## 2021-08-15 DIAGNOSIS — E890 Postprocedural hypothyroidism: Secondary | ICD-10-CM

## 2021-08-16 LAB — T4, FREE: Free T4: 1.05 ng/dL (ref 0.82–1.77)

## 2021-08-16 LAB — TSH: TSH: 0.859 u[IU]/mL (ref 0.450–4.500)

## 2021-08-18 ENCOUNTER — Other Ambulatory Visit: Payer: Self-pay | Admitting: Family Medicine

## 2021-08-18 DIAGNOSIS — Z1231 Encounter for screening mammogram for malignant neoplasm of breast: Secondary | ICD-10-CM

## 2021-08-19 DIAGNOSIS — M818 Other osteoporosis without current pathological fracture: Secondary | ICD-10-CM

## 2021-08-19 DIAGNOSIS — D638 Anemia in other chronic diseases classified elsewhere: Secondary | ICD-10-CM | POA: Diagnosis not present

## 2021-08-19 DIAGNOSIS — F04 Amnestic disorder due to known physiological condition: Secondary | ICD-10-CM

## 2021-08-25 ENCOUNTER — Other Ambulatory Visit: Payer: Self-pay | Admitting: Family Medicine

## 2021-08-26 ENCOUNTER — Ambulatory Visit (INDEPENDENT_AMBULATORY_CARE_PROVIDER_SITE_OTHER): Payer: 59

## 2021-08-26 DIAGNOSIS — L97512 Non-pressure chronic ulcer of other part of right foot with fat layer exposed: Secondary | ICD-10-CM

## 2021-08-26 DIAGNOSIS — D500 Iron deficiency anemia secondary to blood loss (chronic): Secondary | ICD-10-CM

## 2021-08-26 DIAGNOSIS — N189 Chronic kidney disease, unspecified: Secondary | ICD-10-CM

## 2021-08-26 DIAGNOSIS — D638 Anemia in other chronic diseases classified elsewhere: Secondary | ICD-10-CM

## 2021-08-26 DIAGNOSIS — N1832 Chronic kidney disease, stage 3b: Secondary | ICD-10-CM

## 2021-08-26 NOTE — Patient Instructions (Addendum)
Visit Information  Thank you for taking time to visit with me today. Please don't hesitate to contact me if I can be of assistance to you before our next scheduled telephone appointment.  Following are the goals we discussed today:  Patient Goals/Self-Care Activities: Take medications as prescribed   Attend all scheduled provider appointments Call pharmacy for medication refills 3-7 days in advance of running out of medications check blood pressure daily call doctor for signs and symptoms of high blood pressure or low blood pressure keep all doctor appointments Call Dr. Prince Rome today to report any changes in condition or new symptoms. Continue to be careful changing positions Be careful not to fall.  Our next appointment is by telephone on  10/09/2021 at 1:30pm  Please call the care guide team at [phone] if you need to cancel or reschedule your appointment.   If you are experiencing a Mental Health or [place] or need someone to talk to, please call the Suicide and Crisis Lifeline: 988 call the [place] National Suicide Prevention Lifeline: 939-414-3900 or TTY: 720-699-9645 TTY 718-345-2412) to talk to a trained counselor call 1-800-273-TALK (toll free, 24 hour hotline) call 911   Patient verbalizes understanding of instructions and care plan provided today and agrees to view in [place]. Active MyChart status confirmed with patient.    Tomasa Rand RN, BSN, CEN RN Case Freight forwarder - [facility] [employer] exhibitions officer Mobile: [phone]

## 2021-08-26 NOTE — Chronic Care Management (AMB) (Signed)
Chronic Care Management   CCM RN Visit Note  08/26/2021 Name: Molly Parrish MRN: [id number] DOB: 1966/06/28  Subjective: Molly Parrish is a 56 y.o. year old female who is a primary care patient of [facility], Kirsten, MD. The care management team was consulted for assistance with disease management and care coordination needs.    Engaged with patient by telephone for follow up visit in response to provider referral for case management and/or care coordination services.   Consent to Services:  The patient was given information about Chronic Care Management services, agreed to services, and gave verbal consent prior to initiation of services.  Please see initial visit note for detailed documentation.   Patient agreed to services and verbal consent obtained.   Assessment: Review of patient past medical history, allergies, medications, health status, including review of consultants reports, laboratory and other test data, was performed as part of comprehensive evaluation and provision of chronic care management services.   SDOH (Social Determinants of Health) assessments and interventions performed:    CCM Care Plan  Allergies  Allergen Reactions   Cholestyramine     Rash and itching   Trintellix [Vortioxetine]     Outpatient Encounter Medications as of 08/26/2021  Medication Sig   ALPRAZolam (XANAX) 0.25 MG tablet TAKE 1 TABLET BY MOUTH TWICE DAILY   Cyanocobalamin (VITAMIN B 12 PO) Take 1,000 mcg by mouth daily.   DAYVIGO 10 MG TABS TAKE 1 TABLET BY MOUTH AT BEDTIME   ergocalciferol (VITAMIN D2) 1.25 MG (50000 UT) capsule Take by mouth.   famotidine (PEPCID) 40 MG tablet Take 40 mg by mouth at bedtime.   furosemide (LASIX) 40 MG tablet TAKE 2 TABLETS BY MOUTH TWICE DAILY (Patient taking differently: 40 mg. 1 tablet twice a day)   hydrOXYzine (VISTARIL) 25 MG capsule TAKE 1 CAPSULE(25 MG) BY MOUTH THREE TIMES DAILY   Iron-FA-B Cmp-C-Biot-Probiotic (FUSION PLUS) CAPS Take 1 capsule by  mouth 2 (two) times daily.   Iron-Vitamins (GERITOL COMPLETE) TABS Take 1 tablet by mouth daily.   lamoTRIgine (LAMICTAL) 200 MG tablet Take 200 mg by mouth at bedtime.   levothyroxine (SYNTHROID) 175 MCG tablet Take 1 tablet (175 mcg total) by mouth daily.   magnesium oxide (MAG-OX) 400 MG tablet Take 400 mg by mouth daily.   MINOXIDIL, TOPICAL, 5 % SOLN Apply 1 mL topically in the morning and at bedtime.   omeprazole (PRILOSEC) 40 MG capsule Take 1 capsule by mouth at bedtime.   promethazine (PHENERGAN) 25 MG tablet TAKE 1 TABLET BY MOUTH FOUR TIMES DAILY AS NEEDED FOR NAUSEA OR VOMITING   propranolol (INDERAL) 10 MG tablet Take 10 mg by mouth at bedtime.   QUEtiapine (SEROQUEL) 50 MG tablet Take 100 mg by mouth at bedtime.   rifaximin (XIFAXAN) 550 MG TABS tablet Take 550 mg by mouth 2 (two) times daily.   sertraline (ZOLOFT) 100 MG tablet Take 100 mg by mouth in the morning and at bedtime.   sevelamer carbonate (RENVELA) 800 MG tablet Take 800 mg by mouth 3 (three) times daily with meals.   spironolactone (ALDACTONE) 50 MG tablet Take 1 tablet (50 mg total) by mouth 2 (two) times daily.   thiamine 100 MG tablet Take 100 mg by mouth daily.   traMADol (ULTRAM) 50 MG tablet TAKE 2 TABLETS BY MOUTH TWICE DAILY FOR BACK PAIN   triamcinolone (KENALOG) 0.1 % paste Use as directed 1 application in the mouth or throat 2 (two) times daily.   No facility-administered  encounter medications on file as of 08/26/2021.    Patient Active Problem List   Diagnosis Date Noted   Cavus deformity of right foot 38/93/7342   Alcoholic peripheral neuropathy ([place]) 07/07/2021   Metatarsus adductus of right foot 07/07/2021   Encounter for screening mammogram for malignant neoplasm of breast 07/06/2021   Stage 3b chronic kidney disease ([place]) 04/16/2021   Primary insomnia 03/26/2021   Telogen effluvium 03/26/2021   Severe protein-calorie malnutrition ([place]) 03/26/2021   Vitamin D deficiency    GERD  (gastroesophageal reflux disease)    CKD (chronic kidney disease)    Bipolar disorder current episode depressed ([place])    Anemia    Peripheral edema 11/01/2020   Ulcer of right foot, with fat layer exposed ([place]) 10/10/2020   Ulcer of left foot with fat layer exposed ([place]) 10/04/2020   Multiple closed fractures of ribs of left side 06/03/2020   Iron deficiency anemia due to chronic blood loss 05/08/2020   Arterial hypotension 05/08/2020   Mild recurrent major depression ([place]) 04/19/2020   Generalized anxiety disorder 04/19/2020   Neuropathy 01/31/2020   Lumbar disc disease 10/19/2019   Korsakoff syndrome ([place])    Other osteoporosis without current pathological fracture    Portal hypertension ([place])    Alcoholic cirrhosis of liver (HCC)    Anemia of chronic disease 08/08/2016   Hypocalcemia 12/10/2015   Post-surgical hypothyroidism 12/10/2015   Migraine 04/20/2014   Stroke ([place]) 04/20/2014   Obsessive-compulsive disorder 11/07/2013   Alcohol dependence in remission ([place]) 10/04/2013    Conditions to be addressed/monitored:CKD Stage cirrhosis, falls and foot deformity  Care Plan : RN Care Manager plan of care  Updates made by Thana Ates, RN since 08/26/2021 12:00 AM     Problem: No plan of care established for management of chronic disease states ( cirrhosis, CKD stage 3, falls, foot ulcer and infection, anemia)   Priority: High     Long-Range Goal: Development of plan of care for chronic disease management. (cirrhosis, CKD stage 3, falls, foot ulcer and infection, anemia)   Start Date: 07/24/2021  Expected End Date: 07/24/2022  Priority: High  Note:   Current Barriers:  Chronic Disease Management support and education needs related to CKD Stage 3 and cirrhosis, anemia, chronic wound on foot, falls No Advanced Directives in place 07/24/2021  Patient reports she is having worsening kidney function. Reports change of medication ( see medication list) Reports she has had 2 falls.  Reports that she now has blistered areas and warmth to her foot beside of scabbed area, Reports pending reconstruction surgery on 08/01/2021 by Dr. Prince Rome. Has a planned ultrasound of kidneys scheduled for 07/25/2021.  Reports when she was at nephrology office her BP was 79/52. Reports that her blood pressure continues to run low. Self monitors daily.  08/26/2021  Foot-  Patient reports that she had her surgery. Reports surgery will happen in stages. Reports she had 63 stitches.  Reports she is using a scooter for ambulation. Reports she has had 2 falls since using the scooter.  Reports that scooter hit uneven pavement and she has fallen off.  Advanced directives- reports that she has received packet in the mail but has not completed them at this time.  Blood pressure- patient reports that she continues to have low blood pressure. Reports range of 90-100/60-80.  Denies any other new issues or concerns.  RNCM Clinical Goal(s):  Patient will verbalize basic understanding of CKD Stage 3 and cirrhosis, anemia, chronic wound on foot,  falls and disease process and self health management plan as evidenced by patient report. demonstrate improved adherence to prescribed treatment plan for CKD Stage 3, anemia chronic wound on foot  and cirrhosis as evidenced by patient report. collaborate with the care management team towards completion of advanced directives ASAP as evidenced by patient report  through collaboration with RN Care manager, provider, and care team.   Interventions: 1:1 collaboration with primary care provider regarding development and update of comprehensive plan of care as evidenced by provider attestation and co-signature Inter-disciplinary care team collaboration (see longitudinal plan of care) Evaluation of current treatment plan related to  self management and patient's adherence to plan as established by provider   Chronic Kidney Disease (Status: Goal on Track (progressing): YES.)  Long Term  Goal  Last practice recorded BP readings:  BP Readings from Last 3 Encounters:  07/04/21 104/68  05/30/21 (!) 98/52  04/04/21 110/62  Most recent eGFR/CrCl:  Lab Results  Component Value Date   EGFR 28 (L) 07/04/2021    No components found for: CRCL  Evaluation of current treatment plan related to chronic kidney disease self management and patient's adherence to plan as established by provider      Reviewed medications with patient and discussed importance of compliance    Encouraged patient to continue to take her BP daily and call MD for high or low readings.    Falls:  (Status: Goal on Track (progressing): YES.) Long Term Goal  Reviewed medications and discussed potential side effects of medications such as dizziness and frequent urination Advised patient of importance of notifying provider of falls Assessed for signs and symptoms of orthostatic hypotension Assessed for falls since last encounter  Foot deformity and infection  (Status: Goal on Track (progressing): YES.) Long Term Goal  Evaluation of current treatment plan related to  foot deformity and planned surgery , self-management and patient's adherence to plan as established by provider. Discussed plans with patient for ongoing care management follow up and provided patient with direct contact information for care management team Provided education to patient and/or caregiver about advanced directives; Reviewed medications with patient and discussed importance of taking all medications as prescribed; Reviewed scheduled/upcoming provider appointments including PCP, oncology and this case manager; Encouraged patient to call surgeon for any concerns with her foot. Reviewed fall prevention.  Oncology:  (New goal.) Long Term Goal  Assessment of understanding of anemia diagnosis:  Reviewed upcoming provider appointments and treatment appointments  Cirrhosis:  (New Goal) Long Term Assessed for changes in condition Reviewed  medications Encouraged timely follow up for labs and PCP appointments  Patient Goals/Self-Care Activities: Take medications as prescribed   Attend all scheduled provider appointments Call pharmacy for medication refills 3-7 days in advance of running out of medications check blood pressure daily call doctor for signs and symptoms of high blood pressure or low blood pressure keep all doctor appointments Call Dr. Prince Rome today to report any changes in condition or new symptoms. Continue to be careful changing positions Be careful not to fall.     Plan:Telephone follow up appointment with care management team member scheduled for:  10/09/2021  at 1:30pm Tomasa Rand RN, BSN, CEN RN Case Manager - [facility] Mobile: [phone]

## 2021-09-01 ENCOUNTER — Other Ambulatory Visit: Payer: Self-pay | Admitting: Family Medicine

## 2021-09-05 ENCOUNTER — Other Ambulatory Visit: Payer: Self-pay | Admitting: Family Medicine

## 2021-09-05 DIAGNOSIS — K140 Glossitis: Secondary | ICD-10-CM

## 2021-09-15 ENCOUNTER — Other Ambulatory Visit: Payer: Self-pay | Admitting: Family Medicine

## 2021-09-16 DIAGNOSIS — D638 Anemia in other chronic diseases classified elsewhere: Secondary | ICD-10-CM

## 2021-09-16 DIAGNOSIS — N189 Chronic kidney disease, unspecified: Secondary | ICD-10-CM

## 2021-09-16 DIAGNOSIS — D500 Iron deficiency anemia secondary to blood loss (chronic): Secondary | ICD-10-CM

## 2021-09-16 DIAGNOSIS — N1832 Chronic kidney disease, stage 3b: Secondary | ICD-10-CM | POA: Diagnosis not present

## 2021-09-17 ENCOUNTER — Ambulatory Visit
Admission: RE | Admit: 2021-09-17 | Discharge: 2021-09-17 | Disposition: A | Discharge status: Home / Self Care | Payer: 59 | Source: Ambulatory Visit | Attending: Family Medicine | Admitting: Family Medicine

## 2021-09-17 ENCOUNTER — Other Ambulatory Visit: Payer: Self-pay

## 2021-09-17 DIAGNOSIS — Z1231 Encounter for screening mammogram for malignant neoplasm of breast: Secondary | ICD-10-CM

## 2021-09-19 ENCOUNTER — Other Ambulatory Visit: Payer: Self-pay | Admitting: Family Medicine

## 2021-09-19 DIAGNOSIS — R928 Other abnormal and inconclusive findings on diagnostic imaging of breast: Secondary | ICD-10-CM

## 2021-09-23 ENCOUNTER — Other Ambulatory Visit: Payer: Self-pay | Admitting: Family Medicine

## 2021-09-25 NOTE — Progress Notes (Incomplete)
[place]  [address] [place],  [place]  [address] [phone]  Clinic Day:  09/25/2021  Referring physician: Rochel Brome, MD  This document serves as a record of services personally performed by Marice Potter, MD. It was created on their behalf by [facility] E, a trained medical scribe. The creation of this record is based on the scribe's personal observations and the provider's statements to them.  HISTORY OF PRESENT ILLNESS:  The patient is a 56 y.o. female with leukocytosis and anemia.  She comes in today to reassess her peripheral counts.  Since her last visit, she has been doing fairly well.  She denies having increased fatigue or any overt forms of blood loss which concern her for progressive anemia.  She denies having recent infections, inflammatory problems or other issues that could be exacerbating her leukocytosis.  PHYSICAL EXAM:  There were no vitals taken for this visit. Wt Readings from Last 3 Encounters:  07/25/21 136 lb (61.7 kg)  07/04/21 133 lb (60.3 kg)  05/30/21 141 lb 9.6 oz (64.2 kg)   There is no height or weight on file to calculate BMI. Performance status (ECOG): 2 - Symptomatic, <50% confined to bed Physical Exam Constitutional:      Appearance: Normal appearance. She is ill-appearing (chronically ill appearance, but  she looks better vs previous visits).  HENT:     Mouth/Throat:     Mouth: Mucous membranes are moist.     Pharynx: Oropharynx is clear. No oropharyngeal exudate or posterior oropharyngeal erythema.  Cardiovascular:     Rate and Rhythm: Normal rate and regular rhythm.     Heart sounds: No murmur heard.   No friction rub. No gallop.  Pulmonary:     Effort: Pulmonary effort is normal. No respiratory distress.     Breath sounds: Normal breath sounds. No wheezing, rhonchi or rales.  Abdominal:     General: Bowel sounds are normal. There is no distension.     Palpations: Abdomen is soft. There is no  mass.     Tenderness: There is no abdominal tenderness.  Musculoskeletal:        General: No swelling.     Right lower leg: No edema.     Left lower leg: No edema.  Lymphadenopathy:     Cervical: No cervical adenopathy.     Upper Body:     Right upper body: No supraclavicular or axillary adenopathy.     Left upper body: No supraclavicular or axillary adenopathy.     Lower Body: No right inguinal adenopathy. No left inguinal adenopathy.  Skin:    General: Skin is warm.     Coloration: Skin is not jaundiced.     Findings: No lesion or rash.  Neurological:     General: No focal deficit present.     Mental Status: She is alert and oriented to person, place, and time. Mental status is at baseline.  Psychiatric:        Mood and Affect: Mood normal.        Behavior: Behavior normal.        Thought Content: Thought content normal.    LABS:   CBC Latest Ref Rng & Units 07/04/2021 04/04/2021 03/25/2021  WBC 3.4 - 10.8 x10E3/uL 6.5 7.7 6.8  Hemoglobin 11.1 - 15.9 g/dL 9.9(L) 10.7(A) 10.5(L)  Hematocrit 34.0 - 46.6 % 31.1(L) 33(A) 31.9(L)  Platelets 150 - 450 x10E3/uL 322 306 328   CMP Latest Ref Rng & Units  07/04/2021 05/30/2021 04/14/2021  Glucose 70 - 99 mg/dL 79 84 76  BUN 6 - 24 mg/dL 43(H) 33(H) 34(H)  Creatinine 0.57 - 1.00 mg/dL 2.05(H) 1.53(H) 1.57(H)  Sodium 134 - 144 mmol/L 137 135 132(L)  Potassium 3.5 - 5.2 mmol/L 3.5 3.5 3.9  Chloride 96 - 106 mmol/L 97 95(L) 90(L)  CO2 20 - 29 mmol/L '24 26 24  '$ Calcium 8.7 - 10.2 mg/dL 9.5 8.8 10.1  Total Protein 6.0 - 8.5 g/dL 6.0 - 6.7  Total Bilirubin 0.0 - 1.2 mg/dL 0.3 - 0.4  Alkaline Phos 44 - 121 IU/L 220(H) - 140(H)  AST 0 - 40 IU/L 20 - 22  ALT 0 - 32 IU/L 21 - 18   ASSESSMENT & PLAN:  Assessment/Plan:  A 56 y.o. female with leukocytosis and anemia.  I am pleased as her white count remains normal.  I am also pleased as her hemoglobin is above 10.  In the past Retacrit shots were discussed for her anemia, particularly as her  kidney function is suboptimal.   As he hemoglobin remains above 10, her anemia continue to be followed conservatively.  Overall, she appears to be doing fine.  As that is the case, I will see her back in 6 months for repeat clinical assessment.  The patient understands all the plans discussed today and is in agreement with them.     I, Rita Ohara, am acting as scribe for Marice Potter, MD    I have reviewed this report as typed by the medical scribe, and it is complete and accurate.  Dequincy Macarthur Critchley, MD

## 2021-10-02 ENCOUNTER — Inpatient Hospital Stay (HOSPITAL_BASED_OUTPATIENT_CLINIC_OR_DEPARTMENT_OTHER): Payer: 59 | Admitting: Oncology

## 2021-10-02 ENCOUNTER — Other Ambulatory Visit: Payer: Self-pay

## 2021-10-02 ENCOUNTER — Other Ambulatory Visit: Payer: Self-pay | Admitting: Oncology

## 2021-10-02 ENCOUNTER — Inpatient Hospital Stay: Payer: 59 | Attending: Oncology

## 2021-10-02 VITALS — BP 117/66 | HR 86 | Temp 97.7°F | Resp 14 | Ht 66.0 in | Wt 137.7 lb

## 2021-10-02 DIAGNOSIS — D72829 Elevated white blood cell count, unspecified: Secondary | ICD-10-CM | POA: Insufficient documentation

## 2021-10-02 DIAGNOSIS — D649 Anemia, unspecified: Secondary | ICD-10-CM | POA: Diagnosis present

## 2021-10-02 DIAGNOSIS — D638 Anemia in other chronic diseases classified elsewhere: Secondary | ICD-10-CM

## 2021-10-02 LAB — IRON AND TIBC
Iron: 38 ug/dL (ref 28–170)
Saturation Ratios: 16 % (ref 10.4–31.8)
TIBC: 231 ug/dL — ABNORMAL LOW (ref 250–450)
UIBC: 193 ug/dL

## 2021-10-02 LAB — BASIC METABOLIC PANEL
BUN: 28 — AB (ref 4–21)
CO2: 29 — AB (ref 13–22)
Chloride: 94 — AB (ref 99–108)
Creatinine: 1.1 (ref 0.5–1.1)
Glucose: 77
Potassium: 2.8 mEq/L — AB (ref 3.5–5.1)
Sodium: 130 — AB (ref 137–147)

## 2021-10-02 LAB — CBC AND DIFFERENTIAL
HCT: 32 — AB (ref 36–46)
Hemoglobin: 10 — AB (ref 12.0–16.0)
Neutrophils Absolute: 9.92
Platelets: 416 10*3/uL — AB (ref 150–400)
WBC: 12.4

## 2021-10-02 LAB — COMPREHENSIVE METABOLIC PANEL
Albumin: 3.3 — AB (ref 3.5–5.0)
Calcium: 7.4 — AB (ref 8.7–10.7)

## 2021-10-02 LAB — CBC: RBC: 3.74 — AB (ref 3.87–5.11)

## 2021-10-02 LAB — HEPATIC FUNCTION PANEL
ALT: 26 U/L (ref 7–35)
AST: 30 (ref 13–35)
Alkaline Phosphatase: 282 — AB (ref 25–125)
Bilirubin, Total: 0.5

## 2021-10-02 LAB — FERRITIN: Ferritin: 578 ng/mL — ABNORMAL HIGH (ref 11–307)

## 2021-10-03 ENCOUNTER — Ambulatory Visit: Payer: Medicare Other | Admitting: Family Medicine

## 2021-10-03 ENCOUNTER — Telehealth: Payer: Self-pay

## 2021-10-03 ENCOUNTER — Encounter: Payer: Self-pay | Admitting: Oncology

## 2021-10-03 ENCOUNTER — Telehealth: Payer: Self-pay | Admitting: *Deleted

## 2021-10-03 NOTE — Telephone Encounter (Signed)
Per Dr. Bobby Rumpf - labs are consistent with Anemia of chronic disease, not iron deficiency.  He will reassess in 4 months.  Pt aware and states she already has follow up appointment. ?

## 2021-10-03 NOTE — Chronic Care Management (AMB) (Signed)
?  Care Management  ? ?Note ? ?10/03/2021 ?Name: [name] MRN: [id number] DOB: February 06, 1966 ? ?Molly Parrish is a 56 y.o. year old female who is a primary care patient of Cox, Kirsten, MD and is actively engaged with the care management team. I reached out to Lajean Saver by phone today to assist with re-scheduling a follow up visit with the RN Case Manager ? ?Follow up plan: ?Unsuccessful telephone outreach attempt made. A HIPAA compliant phone message was left for the patient providing contact information and requesting a return call.  ?The care management team will reach out to the patient again over the next 7 days.  ?If patient returns call to provider office, please advise to call [facility] at [phone]. ? ?Laverda Sorenson  ?Care Guide, Embedded Care Coordination ?  Care Management  ?Direct Dial: [phone] ? ?

## 2021-10-06 ENCOUNTER — Other Ambulatory Visit: Payer: Self-pay | Admitting: Family Medicine

## 2021-10-07 ENCOUNTER — Ambulatory Visit
Admission: RE | Admit: 2021-10-07 | Discharge: 2021-10-07 | Disposition: A | Discharge status: Home / Self Care | Payer: 59 | Source: Ambulatory Visit | Attending: Family Medicine | Admitting: Family Medicine

## 2021-10-07 ENCOUNTER — Other Ambulatory Visit: Payer: Self-pay | Admitting: Family Medicine

## 2021-10-07 ENCOUNTER — Other Ambulatory Visit: Payer: Self-pay

## 2021-10-07 DIAGNOSIS — R928 Other abnormal and inconclusive findings on diagnostic imaging of breast: Secondary | ICD-10-CM

## 2021-10-09 ENCOUNTER — Telehealth: Payer: Medicare Other

## 2021-10-15 ENCOUNTER — Other Ambulatory Visit: Payer: Self-pay | Admitting: Family Medicine

## 2021-10-15 NOTE — Telephone Encounter (Signed)
Refill sent to pharmacy.   

## 2021-10-15 NOTE — Chronic Care Management (AMB) (Signed)
?  Care Management  ? ?Note ? ?10/15/2021 ?Name: Molly Parrish MRN: [id number] DOB: 01/12/1966 ? ?Molly Parrish is a 56 y.o. year old female who is a primary care patient of Cox, Kirsten, MD and is actively engaged with the care management team. I reached out to Lajean Saver by phone today to assist with re-scheduling a follow up visit with the RN Case Manager ? ?Follow up plan: ?Unsuccessful telephone outreach attempt made. A HIPAA compliant phone message was left for the patient providing contact information and requesting a return call.  ?The care management team will reach out to the patient again over the next 7 days.  ?If patient returns call to provider office, please advise to call [facility] at [phone]. ? ?Laverda Sorenson  ?Care Guide, Embedded Care Coordination ?[facility]  Care Management  ?Direct Dial: [phone] ? ?

## 2021-10-16 ENCOUNTER — Other Ambulatory Visit: Payer: Self-pay | Admitting: Family Medicine

## 2021-10-22 ENCOUNTER — Ambulatory Visit (INDEPENDENT_AMBULATORY_CARE_PROVIDER_SITE_OTHER): Payer: 59 | Admitting: Family Medicine

## 2021-10-22 ENCOUNTER — Encounter: Payer: Self-pay | Admitting: Family Medicine

## 2021-10-22 VITALS — BP 98/52 | HR 71 | Temp 97.4°F | Ht 66.0 in | Wt 135.0 lb

## 2021-10-22 DIAGNOSIS — F428 Other obsessive-compulsive disorder: Secondary | ICD-10-CM | POA: Diagnosis not present

## 2021-10-22 DIAGNOSIS — D638 Anemia in other chronic diseases classified elsewhere: Secondary | ICD-10-CM | POA: Diagnosis not present

## 2021-10-22 DIAGNOSIS — F5101 Primary insomnia: Secondary | ICD-10-CM

## 2021-10-22 DIAGNOSIS — R110 Nausea: Secondary | ICD-10-CM

## 2021-10-22 DIAGNOSIS — E890 Postprocedural hypothyroidism: Secondary | ICD-10-CM | POA: Diagnosis not present

## 2021-10-22 DIAGNOSIS — F411 Generalized anxiety disorder: Secondary | ICD-10-CM

## 2021-10-22 DIAGNOSIS — E559 Vitamin D deficiency, unspecified: Secondary | ICD-10-CM

## 2021-10-22 DIAGNOSIS — M79672 Pain in left foot: Secondary | ICD-10-CM

## 2021-10-22 DIAGNOSIS — K7030 Alcoholic cirrhosis of liver without ascites: Secondary | ICD-10-CM

## 2021-10-22 DIAGNOSIS — Z13220 Encounter for screening for lipoid disorders: Secondary | ICD-10-CM

## 2021-10-22 DIAGNOSIS — N1832 Chronic kidney disease, stage 3b: Secondary | ICD-10-CM

## 2021-10-22 DIAGNOSIS — K766 Portal hypertension: Secondary | ICD-10-CM

## 2021-10-22 DIAGNOSIS — M818 Other osteoporosis without current pathological fracture: Secondary | ICD-10-CM

## 2021-10-22 DIAGNOSIS — F1021 Alcohol dependence, in remission: Secondary | ICD-10-CM

## 2021-10-22 MED ORDER — ONDANSETRON HCL 4 MG PO TABS: 4.0000 mg | ORAL_TABLET | Freq: Three times a day (TID) | ORAL | 0 refills | Status: DC | PRN

## 2021-10-22 NOTE — Progress Notes (Signed)
? ?Subjective:  ?Patient ID: Molly Parrish, female    DOB: [birth year]  Age: 56 y.o. MRN: [id number] ? ?Chief Complaint  ?Patient presents with  ? Hypothyroidism  ? CKD stage 3  ? ?HPI: ?Hypothyroidism: ?Patient is currently taking synthroid 175 mcg once daily in am.  ? ?Chronic Kidney disease: seeing nephrology.  ? ?OCD: sees psychiatry.  ?GAD: Patient is currently taking Xanax 1 tablet twice daily PRN anxiety. Sertraline 100 mg 2 daily.  ?Liver cirrhosis: on xifaxin, spironolactone, renvela, and hydroxyzine.Lasix 40 mg one twice a day. ?History of AOCD.  ?Chronic back pain: Tramadol 50 mg 1-2 tablets a couple times per week.  ? ?Current Outpatient Medications on File Prior to Visit  ?Medication Sig Dispense Refill  ? ALPRAZolam (XANAX) 0.25 MG tablet TAKE 1 TABLET BY MOUTH TWICE DAILY 60 tablet 2  ? Cyanocobalamin (VITAMIN B 12 PO) Take 1,000 mcg by mouth daily.    ? DAYVIGO 10 MG TABS TAKE 1 TABLET BY MOUTH AT BEDTIME 30 tablet 3  ? famotidine (PEPCID) 40 MG tablet Take 40 mg by mouth at bedtime.    ? furosemide (LASIX) 40 MG tablet TAKE 2 TABLETS BY MOUTH TWICE DAILY (Patient taking differently: 40 mg. 1 tablet twice a day) 360 tablet 1  ? hydrOXYzine (VISTARIL) 25 MG capsule TAKE 1 CAPSULE(25 MG) BY MOUTH THREE TIMES DAILY 90 capsule 2  ? Iron-FA-B Cmp-C-Biot-Probiotic (FUSION PLUS) CAPS Take 1 capsule by mouth 2 (two) times daily.    ? Iron-Vitamins (GERITOL COMPLETE) TABS Take 1 tablet by mouth daily.    ? lamoTRIgine (LAMICTAL) 200 MG tablet Take 200 mg by mouth at bedtime.    ? levothyroxine (SYNTHROID) 175 MCG tablet TAKE 1 TABLET(175 MCG) BY MOUTH DAILY 30 tablet 2  ? magnesium oxide (MAG-OX) 400 MG tablet Take 400 mg by mouth daily.    ? MINOXIDIL, TOPICAL, 5 % SOLN Apply 1 mL topically in the morning and at bedtime. 120 mL 2  ? omeprazole (PRILOSEC) 40 MG capsule Take 1 capsule by mouth at bedtime.    ? Potassium Gluconate 2.5 MEQ TABS Potassium    ? promethazine (PHENERGAN) 25 MG tablet TAKE 1 TABLET  BY MOUTH FOUR TIMES DAILY AS NEEDED FOR NAUSEA OR VOMITING 40 tablet 2  ? propranolol (INDERAL) 10 MG tablet Take 10 mg by mouth at bedtime.    ? QUEtiapine (SEROQUEL) 50 MG tablet Take 100 mg by mouth at bedtime.    ? rifaximin (XIFAXAN) 550 MG TABS tablet Take 550 mg by mouth 2 (two) times daily.    ? sertraline (ZOLOFT) 100 MG tablet Take 100 mg by mouth in the morning and at bedtime.    ? sevelamer carbonate (RENVELA) 800 MG tablet Take 800 mg by mouth 3 (three) times daily with meals.    ? spironolactone (ALDACTONE) 50 MG tablet Take 1 tablet (50 mg total) by mouth 2 (two) times daily. 60 tablet 0  ? thiamine 100 MG tablet Take 100 mg by mouth daily.    ? traMADol (ULTRAM) 50 MG tablet TAKE 2 TABLETS BY MOUTH TWICE DAILY FOR BACK PAIN 120 tablet 2  ? triamcinolone (KENALOG) 0.1 % paste Use as directed 1 application in the mouth or throat 2 (two) times daily. 5 g 12  ? ?No current facility-administered medications on file prior to visit.  ? ?Past Medical History:  ?Diagnosis Date  ? Alcohol dependence in remission ([place]) 10/04/2013  ? Alcoholic cirrhosis of liver ([place])   ? Anemia   ?  Anemia of chronic disease 08/08/2016  ? Arterial hypotension 05/08/2020  ? Bipolar disorder current episode depressed ([place])   ? Brain TIA 02/19/2020  ? CKD (chronic kidney disease)   ? Episodic mood disorder ([place]) 11/07/2013  ? Generalized anxiety disorder   ? Generalized weakness 07/11/2020  ? GERD (gastroesophageal reflux disease)   ? H/O bariatric surgery 06/11/2020  ? Hypocalcemia 12/10/2015  ? Hypokalemia   ? Hyponatremia 02/19/2020  ? Intestinal malabsorption   ? Iron deficiency anemia due to chronic blood loss 05/08/2020  ? Korsakoff syndrome ([place])   ? Lumbar disc disease 10/19/2019  ? Microscopic hematuria 04/16/2021  ? Migraine 04/20/2014  ? Mild recurrent major depression ([place]) 04/19/2020  ? Multiple closed fractures of ribs of left side 06/03/2020  ? Formatting of this note might be different from the original. Added automatically from  request for surgery [phone]  ? Neuropathy 01/31/2020  ? Obsessive-compulsive disorder 11/07/2013  ? Other osteoporosis without current pathological fracture   ? Peripheral edema 11/01/2020  ? Polypharmacy 06/12/2020  ? Portal hypertension ([facility])   ? Post-surgical hypothyroidism 12/10/2015  ? Primary insomnia 03/26/2021  ? Severe protein-calorie malnutrition ([place]) 03/26/2021  ? Shortness of breath 11/01/2020  ? Stage 3b chronic kidney disease ([place]) 04/16/2021  ? Stroke [facility])   ?  left basal ganglia stroke (hemorrhagic)  ? Telogen effluvium 03/26/2021  ? Ulcer of left foot with fat layer exposed ([place]) 10/04/2020  ? Ulcer of right foot, with fat layer exposed ([place]) 10/10/2020  ? Vitamin D deficiency   ? ?Past Surgical History:  ?Procedure Laterality Date  ? BREAST EXCISIONAL BIOPSY Left   ? CATARACT EXTRACTION    ? GASTRIC BYPASS  2005  ? HEMORRHOID SURGERY    ? HERNIA REPAIR    ? metatarsus abductus surgery Left 1990  ? PARTIAL HYSTERECTOMY  05/1997  ? BL Ovaries remain. Done for fibroids.  ? THYROIDECTOMY  03/2013  ?  ?Family History  ?Problem Relation Age of Onset  ? Breast cancer Mother   ? Osteoporosis Mother   ? Cancer Mother   ?     Breast, lung, skin.   ? Breast cancer Maternal Grandmother   ? ?Social History  ? ?Socioeconomic History  ? Marital status: Married  ?  Spouse name: Not on file  ? Number of children: 1  ? Years of education: Not on file  ? Highest education level: Not on file  ?Occupational History  ? Occupation: Engineer, maintenance (IT)  ?  Comment: Retired.  ?Tobacco Use  ? Smoking status: Never  ? Smokeless tobacco: Never  ?Substance and Sexual Activity  ? Alcohol use: Not Currently  ?  Comment: history of alcoholism. sober since 2014.  ? Drug use: Never  ? Sexual activity: Yes  ?Other Topics Concern  ? Not on file  ?Social History Narrative  ? Right handed  ? ?Social Determinants of Health  ? ?Financial Resource Strain: Low Risk   ? Difficulty of Paying Living Expenses: Not hard at all  ?Food Insecurity: No Food Insecurity  ?  Worried About Charity fundraiser in the Last Year: Never true  ? Ran Out of Food in the Last Year: Never true  ?Transportation Needs: No Transportation Needs  ? Lack of Transportation (Medical): No  ? Lack of Transportation (Non-Medical): No  ?Physical Activity: Not on file  ?Stress: Not on file  ?Social Connections: Not on file  ? ? ?Review of Systems  ?Constitutional:  Negative for appetite change, fatigue and fever.  ?  HENT:  Negative for congestion, ear pain, sinus pressure and sore throat.   ?Respiratory:  Negative for cough, chest tightness, shortness of breath and wheezing.   ?Cardiovascular:  Negative for chest pain and palpitations.  ?Gastrointestinal:  Positive for nausea and vomiting. Negative for abdominal pain, constipation and diarrhea.  ?Genitourinary:  Negative for dysuria and hematuria.  ?Musculoskeletal:  Negative for arthralgias, back pain, joint swelling and myalgias.  ?Skin:  Negative for rash.  ?Neurological:  Negative for dizziness, weakness and headaches.  ?Psychiatric/Behavioral:  Negative for dysphoric mood. The patient is not nervous/anxious.   ? ? ?Objective:  ?BP (!) 98/52 (BP Location: Right Arm, Patient Position: Sitting)   Pulse 71   Temp (!) 97.4 ?F (36.3 ?C) (Oral)   Ht '5\' 6"'$  (1.676 m)   Wt 135 lb (61.2 kg)   SpO2 98%   BMI 21.79 kg/m?  ? ? ?  10/22/2021  ? 10:33 AM 10/02/2021  ? 10:21 AM 07/25/2021  ? 11:05 AM  ?BP/Weight  ?Systolic BP 98 509 326  ?Diastolic BP 52 66 60  ?Wt. (Lbs) 135 137.7 136  ?BMI 21.79 kg/m2 22.23 kg/m2 21.95 kg/m2  ? ? ?Physical Exam ?Vitals reviewed.  ?Constitutional:   ?   Appearance: Normal appearance. She is normal weight.  ?Neck:  ?   Vascular: No carotid bruit.  ?Cardiovascular:  ?   Rate and Rhythm: Normal rate and regular rhythm.  ?   Heart sounds: Normal heart sounds.  ?Pulmonary:  ?   Effort: Pulmonary effort is normal. No respiratory distress.  ?   Breath sounds: Normal breath sounds.  ?Abdominal:  ?   General: Abdomen is flat. Bowel sounds are  normal.  ?   Palpations: Abdomen is soft.  ?   Tenderness: There is no abdominal tenderness.  ?Neurological:  ?   Mental Status: She is alert and oriented to person, place, and time.  ?Psychiatric:     ?   Mood

## 2021-10-23 LAB — CBC WITH DIFFERENTIAL/PLATELET
Basophils Absolute: 0.1 10*3/uL (ref 0.0–0.2)
Basos: 1 %
EOS (ABSOLUTE): 0.1 10*3/uL (ref 0.0–0.4)
Eos: 1 %
Hematocrit: 34.5 % (ref 34.0–46.6)
Hemoglobin: 10.7 g/dL — ABNORMAL LOW (ref 11.1–15.9)
Immature Grans (Abs): 0.1 10*3/uL (ref 0.0–0.1)
Immature Granulocytes: 1 %
Lymphocytes Absolute: 1.5 10*3/uL (ref 0.7–3.1)
Lymphs: 10 %
MCH: 26.8 pg (ref 26.6–33.0)
MCHC: 31 g/dL — ABNORMAL LOW (ref 31.5–35.7)
MCV: 86 fL (ref 79–97)
Monocytes Absolute: 0.7 10*3/uL (ref 0.1–0.9)
Monocytes: 5 %
Neutrophils Absolute: 12.4 10*3/uL — ABNORMAL HIGH (ref 1.4–7.0)
Neutrophils: 82 %
Platelets: 452 10*3/uL — ABNORMAL HIGH (ref 150–450)
RBC: 4 x10E6/uL (ref 3.77–5.28)
RDW: 14.1 % (ref 11.7–15.4)
WBC: 15 10*3/uL — ABNORMAL HIGH (ref 3.4–10.8)

## 2021-10-23 LAB — COMPREHENSIVE METABOLIC PANEL
ALT: 19 IU/L (ref 0–32)
AST: 19 IU/L (ref 0–40)
Albumin/Globulin Ratio: 1.3 (ref 1.2–2.2)
Albumin: 3.2 g/dL — ABNORMAL LOW (ref 3.8–4.9)
Alkaline Phosphatase: 367 IU/L — ABNORMAL HIGH (ref 44–121)
BUN/Creatinine Ratio: 19 (ref 9–23)
BUN: 27 mg/dL — ABNORMAL HIGH (ref 6–24)
Bilirubin Total: 0.3 mg/dL (ref 0.0–1.2)
CO2: 22 mmol/L (ref 20–29)
Calcium: 8.5 mg/dL — ABNORMAL LOW (ref 8.7–10.2)
Chloride: 93 mmol/L — ABNORMAL LOW (ref 96–106)
Creatinine, Ser: 1.43 mg/dL — ABNORMAL HIGH (ref 0.57–1.00)
Globulin, Total: 2.5 g/dL (ref 1.5–4.5)
Glucose: 62 mg/dL — ABNORMAL LOW (ref 70–99)
Potassium: 4.3 mmol/L (ref 3.5–5.2)
Sodium: 130 mmol/L — ABNORMAL LOW (ref 134–144)
Total Protein: 5.7 g/dL — ABNORMAL LOW (ref 6.0–8.5)
eGFR: 43 mL/min/{1.73_m2} — ABNORMAL LOW (ref >59)

## 2021-10-23 LAB — LIPID PANEL
Chol/HDL Ratio: 2 ratio (ref 0.0–4.4)
Cholesterol, Total: 141 mg/dL (ref 100–199)
HDL: 71 mg/dL (ref >39)
LDL Chol Calc (NIH): 59 mg/dL (ref 0–99)
Triglycerides: 51 mg/dL (ref 0–149)
VLDL Cholesterol Cal: 11 mg/dL (ref 5–40)

## 2021-10-23 LAB — CALCIUM, IONIZED: Calcium, Ion: 4.6 mg/dL (ref 4.5–5.6)

## 2021-10-23 LAB — TSH: TSH: 0.297 u[IU]/mL — ABNORMAL LOW (ref 0.450–4.500)

## 2021-10-23 LAB — PARATHYROID HORMONE, INTACT (NO CA): PTH: 17 pg/mL (ref 15–65)

## 2021-10-23 LAB — VITAMIN D 25 HYDROXY (VIT D DEFICIENCY, FRACTURES): Vit D, 25-Hydroxy: 24.5 ng/mL — ABNORMAL LOW (ref 30.0–100.0)

## 2021-10-23 NOTE — Chronic Care Management (AMB) (Signed)
?  Care Management  ? ?Note ? ?10/23/2021 ?Name: SCOTTI MOTTER MRN: [id number] DOB: [birth year] ? ?Molly Parrish is a 56 y.o. year old female who is a primary care patient of Cox, Kirsten, MD and is actively engaged with the care management team. I reached out to Lajean Saver by phone today to assist with re-scheduling a follow up visit with the RN Case Manager ? ?Follow up plan: ?Telephone appointment with care management team member scheduled for:11/25/21 ? ?Laverda Sorenson  ?Care Guide, Embedded Care Coordination ?[place]  Care Management  ?Direct Dial: [phone] ? ?

## 2021-10-23 NOTE — Progress Notes (Signed)
Blood count abnormal. Wbc elevated. Please ask if patient is feeling sick. Recommend come for stat cbc Friday. Anemia is improved. Platelets little up  ?Liver function normal.  ?Kidney function abnormal. Worsened since last draw.  Repeat cmp stat on Friday also.  ?Sodium low, but stable.  ?Calcium (ionized) normal which is the most accurate level ?Thyroid function normal.  ?PTH normal. Vitamin D is low. Recommend vitamin D 50K twice weekly.  ?Cholesterol: very good. ? ?

## 2021-10-24 ENCOUNTER — Other Ambulatory Visit (INDEPENDENT_AMBULATORY_CARE_PROVIDER_SITE_OTHER): Payer: 59

## 2021-10-24 ENCOUNTER — Other Ambulatory Visit: Payer: Self-pay

## 2021-10-24 DIAGNOSIS — N3001 Acute cystitis with hematuria: Secondary | ICD-10-CM | POA: Diagnosis not present

## 2021-10-24 DIAGNOSIS — K7030 Alcoholic cirrhosis of liver without ascites: Secondary | ICD-10-CM

## 2021-10-24 LAB — CBC WITH DIFFERENTIAL/PLATELET
Basophils Absolute: 0.1 10*3/uL (ref 0.0–0.2)
Basos: 0 %
EOS (ABSOLUTE): 0.2 10*3/uL (ref 0.0–0.4)
Eos: 2 %
Hematocrit: 32.1 % — ABNORMAL LOW (ref 34.0–46.6)
Hemoglobin: 10.3 g/dL — ABNORMAL LOW (ref 11.1–15.9)
Lymphocytes Absolute: 2 10*3/uL (ref 0.7–3.1)
Lymphs: 14 %
MCH: 26.8 pg (ref 26.6–33.0)
MCHC: 32.1 g/dL (ref 31.5–35.7)
MCV: 84 fL (ref 79–97)
Monocytes Absolute: 1 10*3/uL — ABNORMAL HIGH (ref 0.1–0.9)
Monocytes: 7 %
Neutrophils Absolute: 11 10*3/uL — ABNORMAL HIGH (ref 1.4–7.0)
Neutrophils: 77 %
Platelets: 459 10*3/uL — ABNORMAL HIGH (ref 150–450)
RBC: 3.84 x10E6/uL (ref 3.77–5.28)
RDW: 15.3 % (ref 11.7–15.4)
WBC: 14.3 10*3/uL — ABNORMAL HIGH (ref 3.4–10.8)

## 2021-10-24 LAB — COMPREHENSIVE METABOLIC PANEL
ALT: 17 IU/L (ref 0–32)
AST: 16 IU/L (ref 0–40)
Albumin/Globulin Ratio: 1.2 (ref 1.2–2.2)
Albumin: 3.1 g/dL — ABNORMAL LOW (ref 3.8–4.9)
Alkaline Phosphatase: 363 IU/L — ABNORMAL HIGH (ref 44–121)
BUN/Creatinine Ratio: 26 — ABNORMAL HIGH (ref 9–23)
BUN: 27 mg/dL — ABNORMAL HIGH (ref 6–24)
Bilirubin Total: 0.2 mg/dL (ref 0.0–1.2)
CO2: 27 mmol/L (ref 20–29)
Calcium: 8.6 mg/dL — ABNORMAL LOW (ref 8.7–10.2)
Chloride: 98 mmol/L (ref 96–106)
Creatinine, Ser: 1.05 mg/dL — ABNORMAL HIGH (ref 0.57–1.00)
Globulin, Total: 2.5 g/dL (ref 1.5–4.5)
Glucose: 99 mg/dL (ref 70–99)
Potassium: 4 mmol/L (ref 3.5–5.2)
Sodium: 133 mmol/L — ABNORMAL LOW (ref 134–144)
Total Protein: 5.6 g/dL — ABNORMAL LOW (ref 6.0–8.5)
eGFR: 62 mL/min/{1.73_m2} (ref >59)

## 2021-10-24 LAB — POCT URINALYSIS DIP (CLINITEK)
Bilirubin, UA: NEGATIVE
Glucose, UA: NEGATIVE mg/dL
Ketones, POC UA: NEGATIVE mg/dL
Nitrite, UA: NEGATIVE
POC PROTEIN,UA: NEGATIVE
Spec Grav, UA: 1.01 (ref 1.010–1.025)
Urobilinogen, UA: 0.2 E.U./dL
pH, UA: 5.5 (ref 5.0–8.0)

## 2021-10-24 MED ORDER — ERGOCALCIFEROL 1.25 MG (50000 UT) PO CAPS: 50000.0000 [IU] | ORAL_CAPSULE | ORAL | 0 refills | Status: DC

## 2021-10-24 MED ORDER — NITROFURANTOIN MONOHYD MACRO 100 MG PO CAPS: 100.0000 mg | ORAL_CAPSULE | Freq: Two times a day (BID) | ORAL | 0 refills | Status: DC

## 2021-10-24 NOTE — Progress Notes (Signed)
Patient in for STAT labs and requests UA due to elevated WBC.  She c/o pressure when urinating. Patient aware that Dr Tobie Poet will be sending in an antibiotic. ? ?Results for orders placed or performed in visit on 10/24/21 (from the past 24 hour(s))  ?POCT URINALYSIS DIP (CLINITEK)     Status: Abnormal  ? Collection Time: 10/24/21  9:30 AM  ?Result Value Ref Range  ? Color, UA yellow yellow  ? Clarity, UA clear clear  ? Glucose, UA negative negative mg/dL  ? Bilirubin, UA negative negative  ? Ketones, POC UA negative negative mg/dL  ? Spec Grav, UA 1.010 1.010 - 1.025  ? Blood, UA trace-intact (A) negative  ? pH, UA 5.5 5.0 - 8.0  ? POC PROTEIN,UA negative negative, trace  ? Urobilinogen, UA 0.2 0.2 or 1.0 E.U./dL  ? Nitrite, UA Negative Negative  ? Leukocytes, UA Moderate (2+) (A) Negative  ?  ?

## 2021-10-28 LAB — URINE CULTURE

## 2021-10-29 ENCOUNTER — Other Ambulatory Visit: Payer: Self-pay

## 2021-10-29 DIAGNOSIS — M79672 Pain in left foot: Secondary | ICD-10-CM

## 2021-10-31 ENCOUNTER — Other Ambulatory Visit: Payer: Self-pay | Admitting: Family Medicine

## 2021-10-31 ENCOUNTER — Telehealth: Payer: Self-pay

## 2021-10-31 DIAGNOSIS — K140 Glossitis: Secondary | ICD-10-CM

## 2021-10-31 MED ORDER — FLUCONAZOLE 150 MG PO TABS: 150.0000 mg | ORAL_TABLET | Freq: Every day | ORAL | 0 refills | Status: DC

## 2021-10-31 NOTE — Telephone Encounter (Signed)
Molly Parrish called to report that she continues to have some urinary urgency and pressure.   She has no fever or chills.  She also is complaining of thrush from the antibiotics.  She is requesting diflucan which works better for her than the nystatin.  Molly Parrish approved diflucan 150 mg daily X 2.  Molly Parrish was instructed to call for an appointment on Monday if symptoms have not resolved.   ?

## 2021-10-31 NOTE — Telephone Encounter (Signed)
Refill sent to pharmacy.   

## 2021-11-03 ENCOUNTER — Telehealth: Payer: Self-pay

## 2021-11-03 NOTE — Telephone Encounter (Signed)
Patient calling with ongoing UTI symptoms. She complains of new fever, lightheadedness, ShOB, lower back pain, and chills. Patient states BP is 67/59. Advised patient go to ED for evaluation. Patient requesting a provider make advisement. Spoke with [name], she advises patient proceed to ED as well. Patient made aware and she VU. She did state she has had UTI previously she was hospitalized for.  ? ?She is to return call if she has difficulties proceeding to ED.  ? ?Molly Parrish 11/03/21 1:42 PM ? ?

## 2021-11-05 ENCOUNTER — Telehealth: Payer: Self-pay

## 2021-11-05 NOTE — Telephone Encounter (Signed)
Molly Parrish was notified that her ankle xray reveals osteoporosis.  She currently is in the hospital with sepsis from a UTI.  She will schedule DEXA and follow-up after discharge.   ?

## 2021-11-05 NOTE — Assessment & Plan Note (Signed)
Continue prolia.  

## 2021-11-05 NOTE — Assessment & Plan Note (Signed)
Continue dayvigo ?

## 2021-11-05 NOTE — Assessment & Plan Note (Signed)
Remains sober. Has not drank alcohol in 10 yrs.  ? ?

## 2021-11-05 NOTE — Assessment & Plan Note (Signed)
-  Continue propranolol 

## 2021-11-05 NOTE — Assessment & Plan Note (Signed)
The current medical regimen is effective;  continue present plan and medications. ? Follow up with psychiatry.  ?

## 2021-11-05 NOTE — Assessment & Plan Note (Signed)
Management per specialist. 

## 2021-11-05 NOTE — Assessment & Plan Note (Signed)
The current medical regimen is effective;  continue present plan and medications. ?Management per specialist.  ?Remains sober. Has not drank alcohol in 10 yrs.  ?

## 2021-11-05 NOTE — Assessment & Plan Note (Signed)
Check cmp 

## 2021-11-06 ENCOUNTER — Encounter: Payer: Self-pay | Admitting: Family Medicine

## 2021-11-07 ENCOUNTER — Telehealth: Payer: Self-pay

## 2021-11-07 ENCOUNTER — Other Ambulatory Visit: Payer: Self-pay | Admitting: Family Medicine

## 2021-11-07 MED ORDER — ZOLPIDEM TARTRATE 10 MG PO TABS: 10.0000 mg | ORAL_TABLET | Freq: Every evening | ORAL | 1 refills | Status: DC | PRN

## 2021-11-07 NOTE — Telephone Encounter (Signed)
Molly Parrish called to report that she is being discharged from the hospital today.  She is requesting that Dr. Tobie Poet send in Ambien 10 mg for sleep.  This medication was given to her in the hospital and she tolerated this very well.   ?

## 2021-11-10 ENCOUNTER — Telehealth: Payer: Self-pay | Admitting: Family Medicine

## 2021-11-10 NOTE — Telephone Encounter (Signed)
PT CALLED WANTING TO [place] FOLLOW UP APPT. YOUR NEXT OPENING IS 11-25-21. HOSPITAL REQUESTED TO BE SEEN WITHIN A WEEK. DO YOU WANT ME TO PUT HER WITH ANOTHER PROVIDER OR DO YOU WANT TO LOOK AT YOUR SCHEDULE TO FIT HER IN?  ?

## 2021-11-14 ENCOUNTER — Encounter: Payer: Self-pay | Admitting: Physician Assistant

## 2021-11-14 ENCOUNTER — Ambulatory Visit (INDEPENDENT_AMBULATORY_CARE_PROVIDER_SITE_OTHER): Payer: 59 | Admitting: Physician Assistant

## 2021-11-14 VITALS — BP 90/62 | HR 89 | Temp 97.9°F | Ht 66.0 in | Wt 135.0 lb

## 2021-11-14 DIAGNOSIS — N12 Tubulo-interstitial nephritis, not specified as acute or chronic: Secondary | ICD-10-CM | POA: Diagnosis not present

## 2021-11-14 DIAGNOSIS — E871 Hypo-osmolality and hyponatremia: Secondary | ICD-10-CM

## 2021-11-14 DIAGNOSIS — K7030 Alcoholic cirrhosis of liver without ascites: Secondary | ICD-10-CM

## 2021-11-14 DIAGNOSIS — Z8619 Personal history of other infectious and parasitic diseases: Secondary | ICD-10-CM

## 2021-11-14 DIAGNOSIS — D500 Iron deficiency anemia secondary to blood loss (chronic): Secondary | ICD-10-CM

## 2021-11-14 LAB — COMPREHENSIVE METABOLIC PANEL
ALT: 14 IU/L (ref 0–32)
AST: 17 IU/L (ref 0–40)
Albumin/Globulin Ratio: 1.3 (ref 1.2–2.2)
Albumin: 3.4 g/dL — ABNORMAL LOW (ref 3.8–4.9)
Alkaline Phosphatase: 183 IU/L — ABNORMAL HIGH (ref 44–121)
BUN/Creatinine Ratio: 30 — ABNORMAL HIGH (ref 9–23)
BUN: 39 mg/dL — ABNORMAL HIGH (ref 6–24)
Bilirubin Total: 0.3 mg/dL (ref 0.0–1.2)
CO2: 18 mmol/L — ABNORMAL LOW (ref 20–29)
Calcium: 7.8 mg/dL — ABNORMAL LOW (ref 8.7–10.2)
Chloride: 98 mmol/L (ref 96–106)
Creatinine, Ser: 1.31 mg/dL — ABNORMAL HIGH (ref 0.57–1.00)
Globulin, Total: 2.6 g/dL (ref 1.5–4.5)
Glucose: 71 mg/dL (ref 70–99)
Potassium: 4.5 mmol/L (ref 3.5–5.2)
Sodium: 132 mmol/L — ABNORMAL LOW (ref 134–144)
Total Protein: 6 g/dL (ref 6.0–8.5)
eGFR: 47 mL/min/{1.73_m2} — ABNORMAL LOW (ref >59)

## 2021-11-14 LAB — CBC WITH DIFFERENTIAL/PLATELET
Basophils Absolute: 0.1 10*3/uL (ref 0.0–0.2)
Basos: 1 %
EOS (ABSOLUTE): 0.3 10*3/uL (ref 0.0–0.4)
Eos: 2 %
Hematocrit: 27.8 % — ABNORMAL LOW (ref 34.0–46.6)
Hemoglobin: 8.7 g/dL — ABNORMAL LOW (ref 11.1–15.9)
Immature Grans (Abs): 0.4 10*3/uL — ABNORMAL HIGH (ref 0.0–0.1)
Immature Granulocytes: 3 %
Lymphocytes Absolute: 1.9 10*3/uL (ref 0.7–3.1)
Lymphs: 13 %
MCH: 28.2 pg (ref 26.6–33.0)
MCHC: 31.3 g/dL — ABNORMAL LOW (ref 31.5–35.7)
MCV: 90 fL (ref 79–97)
Monocytes Absolute: 1 10*3/uL — ABNORMAL HIGH (ref 0.1–0.9)
Monocytes: 7 %
Neutrophils Absolute: 10.3 10*3/uL — ABNORMAL HIGH (ref 1.4–7.0)
Neutrophils: 74 %
Platelets: 484 10*3/uL — ABNORMAL HIGH (ref 150–450)
RBC: 3.09 x10E6/uL — ABNORMAL LOW (ref 3.77–5.28)
RDW: 17.1 % — ABNORMAL HIGH (ref 11.7–15.4)
WBC: 14 10*3/uL — ABNORMAL HIGH (ref 3.4–10.8)

## 2021-11-14 LAB — POCT URINALYSIS DIP (CLINITEK)
Bilirubin, UA: NEGATIVE
Blood, UA: NEGATIVE
Glucose, UA: NEGATIVE mg/dL
Ketones, POC UA: NEGATIVE mg/dL
Nitrite, UA: NEGATIVE
POC PROTEIN,UA: NEGATIVE
Spec Grav, UA: 1.015 (ref 1.010–1.025)
Urobilinogen, UA: 0.2 E.U./dL
pH, UA: 5 (ref 5.0–8.0)

## 2021-11-14 MED ORDER — CIPROFLOXACIN HCL 500 MG PO TABS: 500.0000 mg | ORAL_TABLET | Freq: Two times a day (BID) | ORAL | 0 refills | Status: DC

## 2021-11-14 NOTE — Progress Notes (Signed)
? ?Subjective:  ?Patient ID: Molly Parrish, female    DOB: 1965-10-08  Age: 56 y.o. MRN: [id number] ? ?Chief Complaint  ?Patient presents with  ? Transitions Of Care  ? ? ?HPI ?Follow up Hospitalization ? ?Patient was admitted to [facility] on 11/04/21 and discharged on 11/07/21. ?She was treated for pyelonephritis,sepsis and acute iron deficiency (with chronic history) ?Treatment for this included IV antibiotics, fluids and was sent home on oral cipro.- she also received blood transfusion while inpatient and is to follow up with hematologist next week (Dr Bobby Rumpf) and is making a follow up with her GI specialist at [facility] ?Pt was found to have hyponatremia associated with dehydration - did receive IV fluids and is due to repeat labwork ?Pt had hypotension while in the hospital and they decreased dosing for her cirrhosis down to spironolactone '25mg'$  (instead of '50mg'$ ) and decreased lasix to '20mg'$  (instead of '40mg'$ ) ? ?She reports good compliance with treatment. ?She reports this condition is improved.but still having some urinary burning and frequency ? ?Current Outpatient Medications on File Prior to Visit  ?Medication Sig Dispense Refill  ? ALPRAZolam (XANAX) 0.25 MG tablet TAKE 1 TABLET BY MOUTH TWICE DAILY 60 tablet 2  ? Cyanocobalamin (VITAMIN B 12 PO) Take 1,000 mcg by mouth daily.    ? ergocalciferol (VITAMIN D2) 1.25 MG (50000 UT) capsule Take 1 capsule (50,000 Units total) by mouth 2 (two) times a week. 24 capsule 0  ? famotidine (PEPCID) 40 MG tablet Take 40 mg by mouth at bedtime.    ? furosemide (LASIX) 20 MG tablet Take 20 mg by mouth daily.    ? hydrOXYzine (VISTARIL) 25 MG capsule TAKE 1 CAPSULE(25 MG) BY MOUTH THREE TIMES DAILY 90 capsule 2  ? Iron-Vitamins (GERITOL COMPLETE) TABS Take 1 tablet by mouth daily.    ? lamoTRIgine (LAMICTAL) 200 MG tablet Take 200 mg by mouth at bedtime.    ? levothyroxine (SYNTHROID) 175 MCG tablet TAKE 1 TABLET(175 MCG) BY MOUTH DAILY 30 tablet 2  ? magnesium  oxide (MAG-OX) 400 MG tablet Take 400 mg by mouth daily.    ? nystatin (MYCOSTATIN) 100000 UNIT/ML suspension SHAKE LIQUID AND TAKE 5 ML BY MOUTH FOUR TIMES DAILY FOR 7 DAYS 473 mL 1  ? omeprazole (PRILOSEC) 40 MG capsule Take 1 capsule by mouth at bedtime.    ? ondansetron (ZOFRAN) 4 MG tablet Take 1 tablet (4 mg total) by mouth every 8 (eight) hours as needed for nausea or vomiting. 60 tablet 0  ? Potassium Gluconate 2.5 MEQ TABS Potassium    ? promethazine (PHENERGAN) 25 MG tablet TAKE 1 TABLET BY MOUTH FOUR TIMES DAILY AS NEEDED FOR NAUSEA OR VOMITING 40 tablet 2  ? propranolol (INDERAL) 10 MG tablet Take 10 mg by mouth at bedtime.    ? QUEtiapine (SEROQUEL) 50 MG tablet Take 100 mg by mouth at bedtime.    ? rifaximin (XIFAXAN) 550 MG TABS tablet Take 550 mg by mouth 2 (two) times daily.    ? sertraline (ZOLOFT) 100 MG tablet Take 100 mg by mouth in the morning and at bedtime.    ? sevelamer carbonate (RENVELA) 800 MG tablet Take 800 mg by mouth 3 (three) times daily with meals.    ? spironolactone (ALDACTONE) 25 MG tablet Take 25 mg by mouth daily.    ? thiamine 100 MG tablet Take 100 mg by mouth daily.    ? traMADol (ULTRAM) 50 MG tablet TAKE 2 TABLETS BY MOUTH TWICE  DAILY FOR BACK PAIN 120 tablet 2  ? triamcinolone (KENALOG) 0.1 % paste Use as directed 1 application in the mouth or throat 2 (two) times daily. 5 g 12  ? zolpidem (AMBIEN) 10 MG tablet Take 1 tablet (10 mg total) by mouth at bedtime as needed for sleep. STOP DAYVIGO 30 tablet 1  ? ?No current facility-administered medications on file prior to visit.  ? ?Past Medical History:  ?Diagnosis Date  ? Alcohol dependence in remission ([place]) 10/04/2013  ? Alcoholic cirrhosis of liver ([place])   ? Anemia   ? Anemia of chronic disease 08/08/2016  ? Arterial hypotension 05/08/2020  ? Bipolar disorder current episode depressed ([place])   ? Brain TIA 02/19/2020  ? CKD (chronic kidney disease)   ? Episodic mood disorder ([place]) 11/07/2013  ? Generalized anxiety disorder   ?  Generalized weakness 07/11/2020  ? GERD (gastroesophageal reflux disease)   ? H/O bariatric surgery 06/11/2020  ? Hypocalcemia 12/10/2015  ? Hypokalemia   ? Hyponatremia 02/19/2020  ? Intestinal malabsorption   ? Iron deficiency anemia due to chronic blood loss 05/08/2020  ? Korsakoff syndrome ([place])   ? Lumbar disc disease 10/19/2019  ? Microscopic hematuria 04/16/2021  ? Migraine 04/20/2014  ? Mild recurrent major depression ([place]) 04/19/2020  ? Multiple closed fractures of ribs of left side 06/03/2020  ? Formatting of this note might be different from the original. Added automatically from request for surgery [id number]  ? Neuropathy 01/31/2020  ? Obsessive-compulsive disorder 11/07/2013  ? Other osteoporosis without current pathological fracture   ? Peripheral edema 11/01/2020  ? Polypharmacy 06/12/2020  ? Portal hypertension (HCC)   ? Post-surgical hypothyroidism 12/10/2015  ? Primary insomnia 03/26/2021  ? Severe protein-calorie malnutrition ([place]) 03/26/2021  ? Shortness of breath 11/01/2020  ? Stage 3b chronic kidney disease ([place]) 04/16/2021  ? Stroke [facility])   ?  left basal ganglia stroke (hemorrhagic)  ? Telogen effluvium 03/26/2021  ? Ulcer of left foot with fat layer exposed ([place]) 10/04/2020  ? Ulcer of right foot, with fat layer exposed ([place]) 10/10/2020  ? Vitamin D deficiency   ? ?Past Surgical History:  ?Procedure Laterality Date  ? BREAST EXCISIONAL BIOPSY Left   ? CATARACT EXTRACTION    ? GASTRIC BYPASS  2005  ? HEMORRHOID SURGERY    ? HERNIA REPAIR    ? metatarsus abductus surgery Left 1990  ? PARTIAL HYSTERECTOMY  05/1997  ? BL Ovaries remain. Done for fibroids.  ? THYROIDECTOMY  03/2013  ?  ?Family History  ?Problem Relation Age of Onset  ? Breast cancer Mother   ? Osteoporosis Mother   ? Cancer Mother   ?     Breast, lung, skin.   ? Breast cancer Maternal Grandmother   ? ?Social History  ? ?Socioeconomic History  ? Marital status: Married  ?  Spouse name: Not on file  ? Number of children: 1  ? Years of education: Not on  file  ? Highest education level: Not on file  ?Occupational History  ? Occupation: Engineer, maintenance (IT)  ?  Comment: Retired.  ?Tobacco Use  ? Smoking status: Never  ? Smokeless tobacco: Never  ?Substance and Sexual Activity  ? Alcohol use: Not Currently  ?  Comment: history of alcoholism. sober since 2014.  ? Drug use: Never  ? Sexual activity: Yes  ?Other Topics Concern  ? Not on file  ?Social History Narrative  ? Right handed  ? ?Social Determinants of Health  ? ?Financial Resource Strain: Low  Risk   ? Difficulty of Paying Living Expenses: Not hard at all  ?Food Insecurity: No Food Insecurity  ? Worried About Charity fundraiser in the Last Year: Never true  ? Ran Out of Food in the Last Year: Never true  ?Transportation Needs: No Transportation Needs  ? Lack of Transportation (Medical): No  ? Lack of Transportation (Non-Medical): No  ?Physical Activity: Not on file  ?Stress: Not on file  ?Social Connections: Not on file  ? ? ?Review of Systems ?CONSTITUTIONAL: Negative for chills, fatigue, fever, unintentional weight gain and unintentional weight loss.  ? ?CARDIOVASCULAR: Negative for chest pain, dizziness, palpitations and pedal edema.  ?RESPIRATORY: Negative for recent cough and dyspnea.  ?GASTROINTESTINAL: Negative for abdominal pain, acid reflux symptoms, constipation, diarrhea, nausea and vomiting.  ?GU - see HPI ?MSK: Negative for arthralgias and myalgias.  ?INTEGUMENTARY: Negative for rash.  ?NEUROLOGICAL: Negative for dizziness and headaches.  ?PSYCHIATRIC: Negative for sleep disturbance and to question depression screen.  Negative for depression, negative for anhedonia.  ?   ? ? ?Objective:  ?BP 90/62   Pulse 89   Temp 97.9 ?F (36.6 ?C)   Ht '5\' 6"'$  (1.676 m)   Wt 135 lb (61.2 kg)   SpO2 98%   BMI 21.79 kg/m?  ? ? ?  11/14/2021  ? 11:04 AM 10/22/2021  ? 10:33 AM 10/02/2021  ? 10:21 AM  ?BP/Weight  ?Systolic BP 90 98 482  ?Diastolic BP 62 52 66  ?Wt. (Lbs) 135 135 137.7  ?BMI 21.79 kg/m2 21.79 kg/m2 22.23 kg/m2   ? ? ?Physical Exam ?PHYSICAL EXAM:  ? ?VS: BP 90/62   Pulse 89   Temp 97.9 ?F (36.6 ?C)   Ht '5\' 6"'$  (1.676 m)   Wt 135 lb (61.2 kg)   SpO2 98%   BMI 21.79 kg/m?  ? ?GEN: Well nourished, thin - walks with cane ?Cardiac:

## 2021-11-16 ENCOUNTER — Other Ambulatory Visit: Payer: Self-pay | Admitting: Physician Assistant

## 2021-11-16 LAB — URINE CULTURE

## 2021-11-17 ENCOUNTER — Telehealth: Payer: Self-pay

## 2021-11-17 NOTE — Telephone Encounter (Signed)
I put notes on her lab for results - please notify pt ?However if she is still feeling bad/etc she needs to make her follow up appt with Dr Tobie Poet this week instead of next week ?

## 2021-11-17 NOTE — Telephone Encounter (Signed)
Patient called stating that she seen her results on my chart stating that her urine culture came back saying mixed flora. Patient states that her Urine Urgency has gotten worse, and the patient also states that her lower abdominal pressure that she has been feeling is now worse. She is wanting to know if you want her to schedule to come back in to the office or what would you advise patient to do?  ?

## 2021-11-17 NOTE — Telephone Encounter (Signed)
Patient aware of labs and plan. However Dr Tobie Poet is full this week. Will speak with Dr Tobie Poet to get patient scheduled earlier.  ? ?Molly Parrish 11/17/21 1:52 PM ? ?

## 2021-11-18 ENCOUNTER — Telehealth: Payer: Self-pay

## 2021-11-18 NOTE — Telephone Encounter (Signed)
Patient left VM stating she is having increased urgency and still having pressure.  ? ?Called patient. Patient is unable to come today and Dr Tobie Poet is full. Scheduled with Gay Filler 5/3.  ? ?Harrell Lark 11/18/21 11:52 AM ? ?

## 2021-11-18 NOTE — Telephone Encounter (Signed)
Per Dr. Tobie Poet to call the patient to assess the pts symptoms for UTI/bladder infection. If it is something symple then she can see Dr. Tobie Poet at the opening of 1:45 however, if it is something that the patient will need more time with the provider then she will need to see a different provider for 40 mins. With that being said as of right now Dr. Henrene Pastor has an opening at 12. I left a message on the pts phone asking her to call the office back. ?

## 2021-11-19 ENCOUNTER — Ambulatory Visit (INDEPENDENT_AMBULATORY_CARE_PROVIDER_SITE_OTHER): Payer: 59 | Admitting: Family Medicine

## 2021-11-19 ENCOUNTER — Other Ambulatory Visit: Payer: Self-pay | Admitting: Family Medicine

## 2021-11-19 ENCOUNTER — Encounter: Payer: Self-pay | Admitting: Family Medicine

## 2021-11-19 VITALS — BP 128/72 | HR 91 | Temp 97.9°F | Ht 66.0 in | Wt 145.0 lb

## 2021-11-19 DIAGNOSIS — R3915 Urgency of urination: Secondary | ICD-10-CM | POA: Diagnosis not present

## 2021-11-19 DIAGNOSIS — R0602 Shortness of breath: Secondary | ICD-10-CM | POA: Diagnosis not present

## 2021-11-19 DIAGNOSIS — R609 Edema, unspecified: Secondary | ICD-10-CM

## 2021-11-19 DIAGNOSIS — N1832 Chronic kidney disease, stage 3b: Secondary | ICD-10-CM

## 2021-11-19 DIAGNOSIS — E43 Unspecified severe protein-calorie malnutrition: Secondary | ICD-10-CM

## 2021-11-19 DIAGNOSIS — D631 Anemia in chronic kidney disease: Secondary | ICD-10-CM

## 2021-11-19 DIAGNOSIS — Q66221 Congenital metatarsus adductus, right foot: Secondary | ICD-10-CM

## 2021-11-19 DIAGNOSIS — F3132 Bipolar disorder, current episode depressed, moderate: Secondary | ICD-10-CM

## 2021-11-19 LAB — CBC WITH DIFFERENTIAL/PLATELET
Basophils Absolute: 0.1 10*3/uL (ref 0.0–0.2)
Basos: 1 %
EOS (ABSOLUTE): 0 10*3/uL (ref 0.0–0.4)
Eos: 0 %
Hematocrit: 29.3 % — ABNORMAL LOW (ref 34.0–46.6)
Hemoglobin: 9.5 g/dL — ABNORMAL LOW (ref 11.1–15.9)
Immature Grans (Abs): 0.2 10*3/uL — ABNORMAL HIGH (ref 0.0–0.1)
Immature Granulocytes: 1 %
Lymphocytes Absolute: 1.1 10*3/uL (ref 0.7–3.1)
Lymphs: 5 %
MCH: 29.4 pg (ref 26.6–33.0)
MCHC: 32.4 g/dL (ref 31.5–35.7)
MCV: 91 fL (ref 79–97)
Monocytes Absolute: 1 10*3/uL — ABNORMAL HIGH (ref 0.1–0.9)
Monocytes: 5 %
Neutrophils Absolute: 18.9 10*3/uL — ABNORMAL HIGH (ref 1.4–7.0)
Neutrophils: 88 %
Platelets: 468 10*3/uL — ABNORMAL HIGH (ref 150–450)
RBC: 3.23 x10E6/uL — ABNORMAL LOW (ref 3.77–5.28)
RDW: 17.3 % — ABNORMAL HIGH (ref 11.7–15.4)
WBC: 21.4 10*3/uL (ref 3.4–10.8)

## 2021-11-19 LAB — COMPREHENSIVE METABOLIC PANEL
ALT: 20 IU/L (ref 0–32)
AST: 19 IU/L (ref 0–40)
Albumin/Globulin Ratio: 1.5 (ref 1.2–2.2)
Albumin: 3.7 g/dL — ABNORMAL LOW (ref 3.8–4.9)
Alkaline Phosphatase: 172 IU/L — ABNORMAL HIGH (ref 44–121)
BUN/Creatinine Ratio: 26 — ABNORMAL HIGH (ref 9–23)
BUN: 40 mg/dL — ABNORMAL HIGH (ref 6–24)
Bilirubin Total: 0.3 mg/dL (ref 0.0–1.2)
CO2: 16 mmol/L — ABNORMAL LOW (ref 20–29)
Calcium: 7.3 mg/dL — ABNORMAL LOW (ref 8.7–10.2)
Chloride: 99 mmol/L (ref 96–106)
Creatinine, Ser: 1.56 mg/dL — ABNORMAL HIGH (ref 0.57–1.00)
Globulin, Total: 2.4 g/dL (ref 1.5–4.5)
Glucose: 125 mg/dL — ABNORMAL HIGH (ref 70–99)
Potassium: 4.2 mmol/L (ref 3.5–5.2)
Sodium: 132 mmol/L — ABNORMAL LOW (ref 134–144)
Total Protein: 6.1 g/dL (ref 6.0–8.5)
eGFR: 38 mL/min/{1.73_m2} — ABNORMAL LOW (ref >59)

## 2021-11-19 LAB — POCT URINALYSIS DIPSTICK
Bilirubin, UA: NEGATIVE
Blood, UA: NEGATIVE
Glucose, UA: NEGATIVE
Ketones, UA: NEGATIVE
Leukocytes, UA: NEGATIVE
Nitrite, UA: NEGATIVE
Protein, UA: NEGATIVE
Spec Grav, UA: 1.015 (ref 1.010–1.025)
Urobilinogen, UA: NEGATIVE E.U./dL — AB
pH, UA: 5.5 (ref 5.0–8.0)

## 2021-11-19 LAB — PRO B NATRIURETIC PEPTIDE: NT-Pro BNP: 758 pg/mL — ABNORMAL HIGH (ref 0–287)

## 2021-11-19 NOTE — Progress Notes (Signed)
Acute Office Visit  Subjective:    Patient ID: Molly Parrish, female    DOB: 02-26-1966, 56 y.o.   MRN: [id number]  Chief Complaint  Patient presents with   Urinary urgency    Lower abdominal pressure    HPI Patient is in today for Lower abdominal pressure, weakness, and urgency. Less urine output. Increasing dysnea on exertion. Patient is lightheaded with getting up and if leans over. Gained 10 lbs in the last week. Patient is very swollen. Complaining of leg and hand cramps. BP has been 90/60. Patient has numerous complaints.  See review of systems below. Past Medical History:  Diagnosis Date   Alcohol dependence in remission ([place]) 03/13/538   Alcoholic cirrhosis of liver (HCC)    Anemia    Anemia of chronic disease 08/08/2016   Arterial hypotension 05/08/2020   Bipolar disorder current episode depressed ([place])    Brain TIA 02/19/2020   CKD (chronic kidney disease)    Episodic mood disorder (HCC) 11/07/2013   Generalized anxiety disorder    Generalized weakness 07/11/2020   GERD (gastroesophageal reflux disease)    H/O bariatric surgery 06/11/2020   Hypocalcemia 12/10/2015   Hypokalemia    Hyponatremia 02/19/2020   Intestinal malabsorption    Iron deficiency anemia due to chronic blood loss 05/08/2020   Korsakoff syndrome ([place])    Lumbar disc disease 10/19/2019   Microscopic hematuria 04/16/2021   Migraine 04/20/2014   Mild recurrent major depression ([place]) 04/19/2020   Multiple closed fractures of ribs of left side 06/03/2020   Formatting of this note might be different from the original. Added automatically from request for surgery [id number]   Neuropathy 01/31/2020   Obsessive-compulsive disorder 11/07/2013   Other osteoporosis without current pathological fracture    Peripheral edema 11/01/2020   Polypharmacy 06/12/2020   Portal hypertension ([place])    Post-surgical hypothyroidism 12/10/2015   Primary insomnia 03/26/2021   Severe protein-calorie malnutrition ([place]) 03/26/2021    Shortness of breath 11/01/2020   Stage 3b chronic kidney disease ([place]) 04/16/2021   Stroke ([place])     left basal ganglia stroke (hemorrhagic)   Telogen effluvium 03/26/2021   Ulcer of left foot with fat layer exposed ([place]) 10/04/2020   Ulcer of right foot, with fat layer exposed ([place]) 10/10/2020   Vitamin D deficiency     Past Surgical History:  Procedure Laterality Date   BREAST EXCISIONAL BIOPSY Left    CATARACT EXTRACTION     GASTRIC BYPASS  2005   HEMORRHOID SURGERY     HERNIA REPAIR     metatarsus abductus surgery Left 1990   PARTIAL HYSTERECTOMY  05/1997   BL Ovaries remain. Done for fibroids.   THYROIDECTOMY  03/2013    Family History  Problem Relation Age of Onset   Breast cancer Mother    Osteoporosis Mother    Cancer Mother        Breast, lung, skin.    Breast cancer Maternal Grandmother     Social History   Socioeconomic History   Marital status: Married    Spouse name: Not on file   Number of children: 1   Years of education: Not on file   Highest education level: Not on file  Occupational History   Occupation: Engineer, maintenance (IT)    Comment: Retired.  Tobacco Use   Smoking status: Never   Smokeless tobacco: Never  Substance and Sexual Activity   Alcohol use: Not Currently    Comment: history of alcoholism. sober since 2014.  Drug use: Never   Sexual activity: Yes  Other Topics Concern   Not on file  Social History Narrative   Right handed   Social Determinants of Health   Financial Resource Strain: Low Risk    Difficulty of Paying Living Expenses: Not hard at all  Food Insecurity: No Food Insecurity   Worried About Charity fundraiser in the Last Year: Never true   [place] in the Last Year: Never true  Transportation Needs: No Transportation Needs   Lack of Transportation (Medical): No   Lack of Transportation (Non-Medical): No  Physical Activity: Not on file  Stress: Not on file  Social Connections: Not on file  Intimate Partner Violence: Not At  Risk   Fear of Current or Ex-Partner: No   Emotionally Abused: No   Physically Abused: No   Sexually Abused: No    Outpatient Medications Prior to Visit  Medication Sig Dispense Refill   ALPRAZolam (XANAX) 0.25 MG tablet TAKE 1 TABLET BY MOUTH TWICE DAILY 60 tablet 2   Cyanocobalamin (VITAMIN B 12 PO) Take 1,000 mcg by mouth daily.     ergocalciferol (VITAMIN D2) 1.25 MG (50000 UT) capsule Take 1 capsule (50,000 Units total) by mouth 2 (two) times a week. 24 capsule 0   famotidine (PEPCID) 40 MG tablet Take 40 mg by mouth at bedtime.     hydrOXYzine (VISTARIL) 25 MG capsule TAKE 1 CAPSULE(25 MG) BY MOUTH THREE TIMES DAILY 90 capsule 2   Iron-Vitamins (GERITOL COMPLETE) TABS Take 1 tablet by mouth daily.     lamoTRIgine (LAMICTAL) 200 MG tablet Take 200 mg by mouth at bedtime.     levothyroxine (SYNTHROID) 175 MCG tablet TAKE 1 TABLET(175 MCG) BY MOUTH DAILY 30 tablet 2   magnesium oxide (MAG-OX) 400 MG tablet Take 400 mg by mouth daily.     nystatin (MYCOSTATIN) 100000 UNIT/ML suspension SHAKE LIQUID AND TAKE 5 ML BY MOUTH FOUR TIMES DAILY FOR 7 DAYS 473 mL 1   omeprazole (PRILOSEC) 40 MG capsule Take 1 capsule by mouth at bedtime.     ondansetron (ZOFRAN) 4 MG tablet Take 1 tablet (4 mg total) by mouth every 8 (eight) hours as needed for nausea or vomiting. 60 tablet 0   propranolol (INDERAL) 10 MG tablet Take 10 mg by mouth at bedtime.     QUEtiapine (SEROQUEL) 50 MG tablet Take 100 mg by mouth at bedtime.     rifaximin (XIFAXAN) 550 MG TABS tablet Take 550 mg by mouth 2 (two) times daily.     sertraline (ZOLOFT) 100 MG tablet Take 100 mg by mouth in the morning and at bedtime.     sevelamer carbonate (RENVELA) 800 MG tablet Take 800 mg by mouth 3 (three) times daily with meals.     thiamine 100 MG tablet Take 100 mg by mouth daily.     traMADol (ULTRAM) 50 MG tablet TAKE 2 TABLETS BY MOUTH TWICE DAILY FOR BACK PAIN 120 tablet 2   triamcinolone (KENALOG) 0.1 % paste Use as directed 1  application in the mouth or throat 2 (two) times daily. 5 g 12   zolpidem (AMBIEN) 10 MG tablet Take 1 tablet (10 mg total) by mouth at bedtime as needed for sleep. STOP DAYVIGO 30 tablet 1   ciprofloxacin (CIPRO) 500 MG tablet Take 1 tablet (500 mg total) by mouth 2 (two) times daily for 7 days. 14 tablet 0   furosemide (LASIX) 20 MG tablet Take 40 mg by  mouth daily.     Potassium Gluconate 2.5 MEQ TABS Potassium     promethazine (PHENERGAN) 25 MG tablet TAKE 1 TABLET BY MOUTH FOUR TIMES DAILY AS NEEDED FOR NAUSEA OR VOMITING 40 tablet 2   spironolactone (ALDACTONE) 25 MG tablet Take 25 mg by mouth daily.     No facility-administered medications prior to visit.   Allergies  Allergen Reactions   Cholestyramine     Rash and itching   Trintellix [Vortioxetine]     Review of Systems  Constitutional:  Positive for appetite change (poor.). Negative for chills, diaphoresis, fatigue and fever.  HENT:  Positive for congestion and ear pain (right ear pain constant.). Negative for sinus pressure and sore throat.   Respiratory:  Positive for shortness of breath. Negative for cough, chest tightness and wheezing.   Cardiovascular:  Negative for chest pain and palpitations.  Gastrointestinal:  Positive for abdominal pain (Lower abdominal pressure), nausea and vomiting (once daily.). Negative for constipation and diarrhea.  Genitourinary:  Positive for frequency and urgency. Negative for dysuria and hematuria.  Musculoskeletal:  Positive for back pain (lumbar). Negative for arthralgias and myalgias.       Pain sensitivity over entire body.   Skin:  Negative for rash.  Neurological:  Positive for light-headedness and headaches (daily. not able to take tylenol or nsaids.). Negative for dizziness and weakness.  Psychiatric/Behavioral:  Negative for dysphoric mood. The patient is nervous/anxious.       Objective:    Physical Exam Vitals reviewed.  Constitutional:      Appearance: Normal appearance.  She is normal weight.     Comments: Frail  HENT:     Right Ear: Tympanic membrane normal.     Left Ear: Tympanic membrane normal.     Nose: Nose normal.     Mouth/Throat:     Pharynx: No oropharyngeal exudate or posterior oropharyngeal erythema.  Neck:     Vascular: No carotid bruit.  Cardiovascular:     Rate and Rhythm: Normal rate and regular rhythm.     Heart sounds: Normal heart sounds.  Pulmonary:     Effort: Pulmonary effort is normal.     Breath sounds: Normal breath sounds.  Abdominal:     General: Abdomen is flat. Bowel sounds are normal.     Palpations: Abdomen is soft.     Tenderness: There is abdominal tenderness (Generalized).  Musculoskeletal:     Right lower leg: Edema present.     Left lower leg: Edema present.  Neurological:     Mental Status: She is alert and oriented to person, place, and time.  Psychiatric:        Mood and Affect: Mood normal.        Behavior: Behavior normal.    BP 128/72 (BP Location: Left Arm, Patient Position: Sitting)   Pulse 91   Temp 97.9 F (36.6 C) (Temporal)   Ht _0  (1.676 m)   Wt 145 lb (65.8 kg)   SpO2 96%   BMI 23.40 kg/m  Wt Readings from Last 3 Encounters:  12/05/21 146 lb (66.2 kg)  11/26/21 135 lb (61.2 kg)  11/19/21 145 lb (65.8 kg)    Health Maintenance Due  Topic Date Due   PAP SMEAR-Modifier  Never done   Zoster Vaccines- Shingrix (2 of 2) 10/21/2020    There are no preventive care reminders to display for this patient.   Lab Results  Component Value Date   TSH 0.297 (L) 10/22/2021   Lab  Results  Component Value Date   WBC 11.9 11/26/2021   HGB 8.8 (A) 11/26/2021   HCT 29 (A) 11/26/2021   MCV 91 11/19/2021   PLT 414 (A) 11/26/2021   Lab Results  Component Value Date   NA 136 12/05/2021   K 3.8 12/05/2021   CO2 26 12/05/2021   GLUCOSE 86 12/05/2021   BUN 31 (H) 12/05/2021   CREATININE 1.04 (H) 12/05/2021   BILITOT 0.2 12/05/2021   ALKPHOS 214 (H) 12/05/2021   AST 19 12/05/2021    ALT 17 12/05/2021   PROT 6.0 12/05/2021   ALBUMIN 3.7 (L) 12/05/2021   CALCIUM 8.9 12/05/2021   EGFR 62 12/05/2021   Lab Results  Component Value Date   CHOL 141 10/22/2021   Lab Results  Component Value Date   HDL 71 10/22/2021   Lab Results  Component Value Date   LDLCALC 59 10/22/2021   Lab Results  Component Value Date   TRIG 51 10/22/2021   Lab Results  Component Value Date   CHOLHDL 2.0 10/22/2021   No results found for: HGBA1C       Assessment & Plan:   Problem List Items Addressed This Visit       Musculoskeletal and Integument   Metatarsus adductus of right foot    Currently on furosemide and spironolactone.         Genitourinary   Stage 3b chronic kidney disease ([employer])    Sent chemistry panel.  Follows up with nephrology.         Other   Peripheral edema    Secondary to liver cirrhosis.       Shortness of breath    Labs drawn today.  Check proBNP. Ordered chest x-ray.  Based on this may increase Lasix.       Relevant Orders   CBC with Differential/Platelet (Completed)   Comprehensive metabolic panel (Completed)   Pro b natriuretic peptide (Completed)   Bipolar disorder current episode depressed ([place])    Management per psychiatry       Anemia    Multifactorial.  Patient has had an EGD and a colonoscopy both of which showed no source of bleeding. Patient has been referred to hematology.       Severe protein-calorie malnutrition ([place])    Secondary to gastric bypass and liver cirrhosis.        Urinary urgency - Primary    Ordered UA.       Relevant Orders   POCT urinalysis dipstick (Completed)    I,Lauren M Auman,acting as a scribe for Rochel Brome, MD.,have documented all relevant documentation on the behalf of Rochel Brome, MD,as directed by  Rochel Brome, MD while in the presence of Rochel Brome, MD.   Follow up: 1 month  Rochel Brome, MD

## 2021-11-20 ENCOUNTER — Other Ambulatory Visit: Payer: Self-pay | Admitting: Family Medicine

## 2021-11-20 ENCOUNTER — Encounter: Payer: Self-pay | Admitting: Family Medicine

## 2021-11-20 ENCOUNTER — Telehealth: Payer: Self-pay

## 2021-11-20 MED ORDER — AMOXICILLIN-POT CLAVULANATE 875-125 MG PO TABS: 1.0000 | ORAL_TABLET | Freq: Two times a day (BID) | ORAL | 0 refills | Status: DC

## 2021-11-20 MED ORDER — AZITHROMYCIN 250 MG PO TABS: ORAL_TABLET | ORAL | 0 refills | Status: AC

## 2021-11-20 NOTE — Telephone Encounter (Signed)
Patient  called and she wanted you know that she has chills for one week on and off, and also lower part of her right sided of the throat and right ear are hurting. She is going to get the Xray. Please advice. ?

## 2021-11-20 NOTE — Telephone Encounter (Signed)
Dxd with pneumonia. Started on antibiotics. Dr Tobie Poet  ?

## 2021-11-21 ENCOUNTER — Ambulatory Visit: Payer: Medicare Other | Admitting: Oncology

## 2021-11-21 ENCOUNTER — Other Ambulatory Visit: Payer: Medicare Other

## 2021-11-24 DIAGNOSIS — R3915 Urgency of urination: Secondary | ICD-10-CM | POA: Insufficient documentation

## 2021-11-24 NOTE — Assessment & Plan Note (Signed)
Ordered UA

## 2021-11-24 NOTE — Assessment & Plan Note (Addendum)
Labs drawn today.  Check proBNP. Ordered chest x-ray.  Based on this may increase Lasix.

## 2021-11-25 ENCOUNTER — Ambulatory Visit (INDEPENDENT_AMBULATORY_CARE_PROVIDER_SITE_OTHER): Payer: 59

## 2021-11-25 DIAGNOSIS — N189 Chronic kidney disease, unspecified: Secondary | ICD-10-CM

## 2021-11-25 DIAGNOSIS — D500 Iron deficiency anemia secondary to blood loss (chronic): Secondary | ICD-10-CM

## 2021-11-25 DIAGNOSIS — D638 Anemia in other chronic diseases classified elsewhere: Secondary | ICD-10-CM

## 2021-11-25 DIAGNOSIS — N1832 Chronic kidney disease, stage 3b: Secondary | ICD-10-CM

## 2021-11-25 DIAGNOSIS — L97512 Non-pressure chronic ulcer of other part of right foot with fat layer exposed: Secondary | ICD-10-CM

## 2021-11-25 NOTE — Chronic Care Management (AMB) (Signed)
Chronic Care Management   CCM RN Visit Note  11/25/2021 Name: Molly Parrish MRN: [id number] DOB: 07-Jan-1966  Subjective: Molly Parrish is a 56 y.o. year old female who is a primary care patient of Cox, Kirsten, MD. The care management team was consulted for assistance with disease management and care coordination needs.    Engaged with patient by telephone for follow up visit in response to provider referral for case management and/or care coordination services.   Consent to Services:  The patient was given information about Chronic Care Management services, agreed to services, and gave verbal consent prior to initiation of services.  Please see initial visit note for detailed documentation.   Patient agreed to services and verbal consent obtained.   Assessment: Review of patient past medical history, allergies, medications, health status, including review of consultants reports, laboratory and other test data, was performed as part of comprehensive evaluation and provision of chronic care management services.   SDOH (Social Determinants of Health) assessments and interventions performed:  No   CCM Care Plan  Allergies  Allergen Reactions   Cholestyramine     Rash and itching   Trintellix [Vortioxetine]     Outpatient Encounter Medications as of 11/25/2021  Medication Sig   ALPRAZolam (XANAX) 0.25 MG tablet TAKE 1 TABLET BY MOUTH TWICE DAILY   amoxicillin-clavulanate (AUGMENTIN) 875-125 MG tablet Take 1 tablet by mouth 2 (two) times daily.   azithromycin (ZITHROMAX) 250 MG tablet Take 2 tablets on day 1, then 1 tablet daily on days 2 through 5   Cyanocobalamin (VITAMIN B 12 PO) Take 1,000 mcg by mouth daily.   ergocalciferol (VITAMIN D2) 1.25 MG (50000 UT) capsule Take 1 capsule (50,000 Units total) by mouth 2 (two) times a week.   famotidine (PEPCID) 40 MG tablet Take 40 mg by mouth at bedtime.   furosemide (LASIX) 20 MG tablet Take 20 mg by mouth daily.   hydrOXYzine  (VISTARIL) 25 MG capsule TAKE 1 CAPSULE(25 MG) BY MOUTH THREE TIMES DAILY   Iron-Vitamins (GERITOL COMPLETE) TABS Take 1 tablet by mouth daily.   lamoTRIgine (LAMICTAL) 200 MG tablet Take 200 mg by mouth at bedtime.   levothyroxine (SYNTHROID) 175 MCG tablet TAKE 1 TABLET(175 MCG) BY MOUTH DAILY   magnesium oxide (MAG-OX) 400 MG tablet Take 400 mg by mouth daily.   nystatin (MYCOSTATIN) 100000 UNIT/ML suspension SHAKE LIQUID AND TAKE 5 ML BY MOUTH FOUR TIMES DAILY FOR 7 DAYS   omeprazole (PRILOSEC) 40 MG capsule Take 1 capsule by mouth at bedtime.   ondansetron (ZOFRAN) 4 MG tablet Take 1 tablet (4 mg total) by mouth every 8 (eight) hours as needed for nausea or vomiting.   Potassium Gluconate 2.5 MEQ TABS Potassium   promethazine (PHENERGAN) 25 MG tablet TAKE 1 TABLET BY MOUTH FOUR TIMES DAILY AS NEEDED FOR NAUSEA OR VOMITING   propranolol (INDERAL) 10 MG tablet Take 10 mg by mouth at bedtime.   QUEtiapine (SEROQUEL) 50 MG tablet Take 100 mg by mouth at bedtime.   rifaximin (XIFAXAN) 550 MG TABS tablet Take 550 mg by mouth 2 (two) times daily.   sertraline (ZOLOFT) 100 MG tablet Take 100 mg by mouth in the morning and at bedtime.   sevelamer carbonate (RENVELA) 800 MG tablet Take 800 mg by mouth 3 (three) times daily with meals.   spironolactone (ALDACTONE) 25 MG tablet Take 25 mg by mouth daily.   thiamine 100 MG tablet Take 100 mg by mouth daily.   traMADol Veatrice Bourbon)  50 MG tablet TAKE 2 TABLETS BY MOUTH TWICE DAILY FOR BACK PAIN   triamcinolone (KENALOG) 0.1 % paste Use as directed 1 application in the mouth or throat 2 (two) times daily.   zolpidem (AMBIEN) 10 MG tablet Take 1 tablet (10 mg total) by mouth at bedtime as needed for sleep. STOP DAYVIGO   No facility-administered encounter medications on file as of 11/25/2021.    Patient Active Problem List   Diagnosis Date Noted   Urinary urgency 11/24/2021   Cavus deformity of right foot 84/16/6063   Alcoholic peripheral neuropathy ([place])  07/07/2021   Metatarsus adductus of right foot 07/07/2021   Encounter for screening mammogram for malignant neoplasm of breast 07/06/2021   Stage 3b chronic kidney disease ([place]) 04/16/2021   Primary insomnia 03/26/2021   Telogen effluvium 03/26/2021   Severe protein-calorie malnutrition ([place]) 03/26/2021   Vitamin D deficiency    GERD (gastroesophageal reflux disease)    CKD (chronic kidney disease)    Bipolar disorder current episode depressed ([place])    Anemia    Peripheral edema 11/01/2020   Shortness of breath 11/01/2020   Ulcer of right foot, with fat layer exposed ([place]) 10/10/2020   Ulcer of left foot with fat layer exposed ([place]) 10/04/2020   Multiple closed fractures of ribs of left side 06/03/2020   Iron deficiency anemia due to chronic blood loss 05/08/2020   Arterial hypotension 05/08/2020   Mild recurrent major depression ([place]) 04/19/2020   Generalized anxiety disorder 04/19/2020   Neuropathy 01/31/2020   Lumbar disc disease 10/19/2019   Korsakoff syndrome ([place])    Other osteoporosis without current pathological fracture    Portal hypertension ([place])    Alcoholic cirrhosis of liver ([facility])    Anemia of chronic disease 08/08/2016   Hypocalcemia 12/10/2015   Post-surgical hypothyroidism 12/10/2015   Migraine 04/20/2014   Stroke ([place]) 04/20/2014   Obsessive-compulsive disorder 11/07/2013   Alcohol dependence in remission ([place]) 10/04/2013    Conditions to be addressed/monitored:CKD Stage Falls Anemia Cirrhosis  and Foot ulcer and Infection  Care Plan : RN Care Manager plan of care  Updates made by Lazaro Arms, RN since 11/25/2021 12:00 AM     Problem: No plan of care established for management of chronic disease states ( cirrhosis, CKD stage 3, falls, foot ulcer and infection, anemia)   Priority: High     Long-Range Goal: Development of plan of care for chronic disease management. (cirrhosis, CKD stage 3, falls, foot ulcer and infection, anemia)   Start Date: 07/24/2021   Expected End Date: 07/24/2022  Priority: High  Note:   Current Barriers:  Chronic Disease Management support and education needs related to CKD Stage 3 and cirrhosis, anemia, chronic wound on foot, falls No Advanced Directives in place 07/24/2021  Patient reports she is having worsening kidney function. Reports change of medication ( see medication list) Reports she has had 2 falls. Reports that she now has blistered areas and warmth to her foot beside of scabbed area, Reports pending reconstruction surgery on 08/01/2021 by Dr. Prince Rome. Has a planned ultrasound of kidneys scheduled for 07/25/2021.  Reports when she was at nephrology office her BP was 79/52. Reports that her blood pressure continues to run low. Self monitors daily.  08/26/2021  Foot-  Patient reports that she had her surgery. Reports surgery will happen in stages. Reports she had 63 stitches.  Reports she is using a scooter for ambulation. Reports she has had 2 falls since using the scooter.  Reports that  scooter hit uneven pavement and she has fallen off.  Advanced directives- reports that she has received packet in the mail but has not completed them at this time.  Blood pressure- patient reports that she continues to have low blood pressure. Reports range of 90-100/60-80.  Denies any other new issues or concerns. 11/25/21: I am standing in for Tomasa Rand, RN, and I had a follow-up call with Molly Parrish. She mentioned feeling weak and tired after being discharged from the hospital, but her HGB level is 9.0, which is good news. Molly Parrish no longer uses the manual scooter and instead has a boot on her foot. She mentioned using a cane but still experiences some balance issues. I suggested she use her rollator, which can provide more stability and allow her to take a seat if she feels dizzy or tired. She agreed and seemed to understand. Molly Parrish remains positive and motivated by her grandchildren. She also mentioned experiencing itching  issues all over her body, which she has tried to alleviate with various creams. I advised her to contact her PCP, who knows her condition and can offer helpful information. Lastly, her next call with Tomasa Rand, RN, is scheduled for June 8th, 2023, at 2 pm.  RNCM Clinical Goal(s):  Patient will verbalize basic understanding of CKD Stage 3 and cirrhosis, anemia, chronic wound on foot, falls and disease process and self health management plan as evidenced by patient report. demonstrate improved adherence to prescribed treatment plan for CKD Stage 3, anemia chronic wound on foot  and cirrhosis as evidenced by patient report. collaborate with the care management team towards completion of advanced directives ASAP as evidenced by patient report  through collaboration with RN Care manager, provider, and care team.   Interventions: 1:1 collaboration with primary care provider regarding development and update of comprehensive plan of care as evidenced by provider attestation and co-signature Inter-disciplinary care team collaboration (see longitudinal plan of care) Evaluation of current treatment plan related to  self management and patient's adherence to plan as established by provider   Chronic Kidney Disease (Status: Goal on Track (progressing): YES.)  Long Term Goal  Last practice recorded BP readings:  BP Readings from Last 3 Encounters:  11/19/21 128/72  11/14/21 90/62  10/22/21 (!) 98/52  Most recent eGFR/CrCl:  Lab Results  Component Value Date   EGFR 38 (L) 11/19/2021    No components found for: CRCL  Evaluation of current treatment plan related to chronic kidney disease self management and patient's adherence to plan as established by provider      Reviewed medications with patient and discussed importance of compliance    Encouraged patient to continue to take her BP daily and call MD for high or low readings.    Falls:  (Status: Goal on Track (progressing): YES.) Long Term Goal   Reviewed medications and discussed potential side effects of medications such as dizziness and frequent urination Advised patient of importance of notifying provider of falls Assessed for signs and symptoms of orthostatic hypotension Assessed for falls since last encounter  Foot deformity and infection  (Status: Goal on Track (progressing): YES.) Long Term Goal  Evaluation of current treatment plan related to  foot deformity and planned surgery , self-management and patient's adherence to plan as established by provider. Reviewed medications with patient and discussed importance of taking all medications as prescribed; Reviewed scheduled/upcoming provider appointments including PCP, oncology and this case manager; Encouraged patient to call surgeon for any concerns with her  foot. Reviewed fall prevention.  Oncology:  (Goal on Track (progressing): YES.) Long Term Goal  Assessment of understanding of anemia diagnosis:  Reviewed upcoming provider appointments and treatment appointments  Cirrhosis:  (Goal on Track (progressing): YES. Long Term Assessed for changes in condition Reviewed medications Encouraged timely follow up for labs and PCP appointments  Patient Goals/Self-Care Activities: Take medications as prescribed   Attend all scheduled provider appointments Call pharmacy for medication refills 3-7 days in advance of running out of medications check blood pressure daily call doctor for signs and symptoms of high blood pressure or low blood pressure keep all doctor appointments Call Dr. Prince Rome today to report any changes in condition or new symptoms. Continue to be careful changing positions Be careful not to fall.    Lazaro Arms RN, BSN, [facility] Care Management Coordinator CM Registered Nurse Coverage  for  Tomasa Rand RN   Phone: [phone]

## 2021-11-25 NOTE — Patient Instructions (Signed)
Visit Information ? ?Ms. Tailor  it was nice speaking with you. Please call me directly [phone] if you have questions about the goals we discussed. ? ?  ?Patient Goals/Self-Care Activities: ?Take medications as prescribed   ?Attend all scheduled provider appointments ?Call pharmacy for medication refills 3-7 days in advance of running out of medications ?check blood pressure daily ?call doctor for signs and symptoms of high blood pressure or low blood pressure ?keep all doctor appointments ?Call Dr. Prince Rome today to report any changes in condition or new symptoms. ?Continue to be careful changing positions ?Be careful not to fall. ? ? ?Patient verbalizes understanding of instructions and care plan provided today and agrees to view in [place]. Active MyChart status confirmed with patient.   ? ?Follow up Plan: Patient would like continued follow-up.  CCM RNCM will outreach the patient within the next 5 weeks  Patient will call office if needed prior to next encounter ? ?Lazaro Arms, RN ? ?[phone]  ?

## 2021-11-26 ENCOUNTER — Inpatient Hospital Stay: Payer: 59 | Attending: Oncology

## 2021-11-26 ENCOUNTER — Other Ambulatory Visit: Payer: Self-pay | Admitting: Oncology

## 2021-11-26 ENCOUNTER — Inpatient Hospital Stay (HOSPITAL_BASED_OUTPATIENT_CLINIC_OR_DEPARTMENT_OTHER): Payer: 59 | Admitting: Oncology

## 2021-11-26 VITALS — BP 115/67 | HR 96 | Temp 98.5°F | Resp 16 | Ht 66.0 in | Wt 135.0 lb

## 2021-11-26 DIAGNOSIS — D72829 Elevated white blood cell count, unspecified: Secondary | ICD-10-CM

## 2021-11-26 DIAGNOSIS — D649 Anemia, unspecified: Secondary | ICD-10-CM | POA: Diagnosis present

## 2021-11-26 DIAGNOSIS — D638 Anemia in other chronic diseases classified elsewhere: Secondary | ICD-10-CM

## 2021-11-26 DIAGNOSIS — Z79899 Other long term (current) drug therapy: Secondary | ICD-10-CM | POA: Diagnosis not present

## 2021-11-26 DIAGNOSIS — R531 Weakness: Secondary | ICD-10-CM | POA: Diagnosis not present

## 2021-11-26 DIAGNOSIS — D508 Other iron deficiency anemias: Secondary | ICD-10-CM

## 2021-11-26 LAB — IRON AND TIBC
Iron: 26 ug/dL — ABNORMAL LOW (ref 28–170)
Saturation Ratios: 10 % — ABNORMAL LOW (ref 10.4–31.8)
TIBC: 262 ug/dL (ref 250–450)
UIBC: 236 ug/dL

## 2021-11-26 LAB — CBC AND DIFFERENTIAL
HCT: 29 — AB (ref 36–46)
Hemoglobin: 8.8 — AB (ref 12.0–16.0)
Neutrophils Absolute: 9.64
Platelets: 414 10*3/uL — AB (ref 150–400)
WBC: 11.9

## 2021-11-26 LAB — FERRITIN: Ferritin: 710 ng/mL — ABNORMAL HIGH (ref 11–307)

## 2021-11-26 LAB — CBC: RBC: 3.2 — AB (ref 3.87–5.11)

## 2021-11-26 NOTE — Progress Notes (Signed)
Molly Parrish  [address] [place],  [place]  [address] [phone]  Clinic Day:  11/26/2021  Referring physician: Rochel Brome, MD   HISTORY OF PRESENT ILLNESS:  The patient is a 56 y.o. female with a history of both leukocytosis and anemia.  She comes in today to reassess her peripheral counts.  Just last week, the patient was hospitalized for urosepsis.  Furthermore, her hemoglobin was low at 6.6.  This led to her receiving both 2 units of blood and IV iron.  A repeat GI evaluation was not done due to her having both a EGD and colonoscopy within the past year.  Overall, the patient claims to feel better today.  She still feels moderately weak; however, she continues to deny having any overt forms of blood loss.   PHYSICAL EXAM:  There were no vitals taken for this visit. Wt Readings from Last 3 Encounters:  11/19/21 145 lb (65.8 kg)  11/14/21 135 lb (61.2 kg)  10/22/21 135 lb (61.2 kg)   There is no height or weight on file to calculate BMI. Performance status (ECOG): 2 - Symptomatic, <50% confined to bed Physical Exam Constitutional:      Appearance: Normal appearance. She is ill-appearing (chronically ill appearance, but  she looks better vs previous visits).  HENT:     Mouth/Throat:     Mouth: Mucous membranes are moist.     Pharynx: Oropharynx is clear. No oropharyngeal exudate or posterior oropharyngeal erythema.  Cardiovascular:     Rate and Rhythm: Normal rate and regular rhythm.     Heart sounds: No murmur heard.   No friction rub. No gallop.  Pulmonary:     Effort: Pulmonary effort is normal. No respiratory distress.     Breath sounds: Normal breath sounds. No wheezing, rhonchi or rales.  Abdominal:     General: Bowel sounds are normal. There is no distension.     Palpations: Abdomen is soft. There is no mass.     Tenderness: There is no abdominal tenderness.  Musculoskeletal:        General: No swelling.     Right lower leg: No  edema.     Left lower leg: No edema.  Lymphadenopathy:     Cervical: No cervical adenopathy.     Upper Body:     Right upper body: No supraclavicular or axillary adenopathy.     Left upper body: No supraclavicular or axillary adenopathy.     Lower Body: No right inguinal adenopathy. No left inguinal adenopathy.  Skin:    General: Skin is warm.     Coloration: Skin is not jaundiced.     Findings: No lesion or rash.  Neurological:     General: No focal deficit present.     Mental Status: She is alert and oriented to person, place, and time. Mental status is at baseline.  Psychiatric:        Mood and Affect: Mood normal.        Behavior: Behavior normal.        Thought Content: Thought content normal.    LABS:       Latest Ref Rng & Units 11/19/2021    3:52 PM 11/14/2021   11:35 AM 10/24/2021    9:20 AM  CBC  WBC 3.4 - 10.8 x10E3/uL 21.4   14.0   14.3    Hemoglobin 11.1 - 15.9 g/dL 9.5   8.7   10.3    Hematocrit 34.0 - 46.6 %  29.3   27.8   32.1    Platelets 150 - 450 x10E3/uL 468   484   459      Latest Reference Range & Units 11/26/21 16:24  Iron 28 - 170 ug/dL 26 (L)  UIBC ug/dL 236  TIBC 250 - 450 ug/dL 262  Saturation Ratios 10.4 - 31.8 % 10 (L)  Ferritin 11 - 307 ng/mL 710 (H)  (L): Data is abnormally low (H): Data is abnormally high  ASSESSMENT & PLAN:  Assessment/Plan:  A 56 y.o. female with leukocytosis and anemia.  I am pleased as her white count has already significantly improved since being discharged from the hospital.  This likely reflects her previous infection has essentially been cleared.  As mentioned previously, she was severely anemic upon her recent hospital admission.  Her iron studies are currently pending.  The patient understands I would consider another course of IV iron if her iron parameters remain suboptimal.  Despite her anemia, she appears to clinically be doing well.  I will see her back in 3 months for repeat clinical assessment.  The patient  understands all the plans discussed today and is in agreement with them.     Molly Donathan Macarthur Critchley, MD

## 2021-12-02 ENCOUNTER — Encounter: Payer: Self-pay | Admitting: Family Medicine

## 2021-12-05 ENCOUNTER — Ambulatory Visit (INDEPENDENT_AMBULATORY_CARE_PROVIDER_SITE_OTHER): Payer: 59 | Admitting: Family Medicine

## 2021-12-05 VITALS — BP 104/64 | HR 80 | Temp 97.4°F | Resp 14 | Ht 66.0 in | Wt 146.0 lb

## 2021-12-05 DIAGNOSIS — D500 Iron deficiency anemia secondary to blood loss (chronic): Secondary | ICD-10-CM

## 2021-12-05 DIAGNOSIS — E871 Hypo-osmolality and hyponatremia: Secondary | ICD-10-CM | POA: Diagnosis not present

## 2021-12-05 DIAGNOSIS — Z8619 Personal history of other infectious and parasitic diseases: Secondary | ICD-10-CM | POA: Diagnosis not present

## 2021-12-05 DIAGNOSIS — N12 Tubulo-interstitial nephritis, not specified as acute or chronic: Secondary | ICD-10-CM | POA: Diagnosis not present

## 2021-12-05 LAB — COMPREHENSIVE METABOLIC PANEL
ALT: 17 IU/L (ref 0–32)
AST: 19 IU/L (ref 0–40)
Albumin/Globulin Ratio: 1.6 (ref 1.2–2.2)
Albumin: 3.7 g/dL — ABNORMAL LOW (ref 3.8–4.9)
Alkaline Phosphatase: 214 IU/L — ABNORMAL HIGH (ref 44–121)
BUN/Creatinine Ratio: 30 — ABNORMAL HIGH (ref 9–23)
BUN: 31 mg/dL — ABNORMAL HIGH (ref 6–24)
Bilirubin Total: 0.2 mg/dL (ref 0.0–1.2)
CO2: 26 mmol/L (ref 20–29)
Calcium: 8.9 mg/dL (ref 8.7–10.2)
Chloride: 99 mmol/L (ref 96–106)
Creatinine, Ser: 1.04 mg/dL — ABNORMAL HIGH (ref 0.57–1.00)
Globulin, Total: 2.3 g/dL (ref 1.5–4.5)
Glucose: 86 mg/dL (ref 70–99)
Potassium: 3.8 mmol/L (ref 3.5–5.2)
Sodium: 136 mmol/L (ref 134–144)
Total Protein: 6 g/dL (ref 6.0–8.5)
eGFR: 62 mL/min/{1.73_m2} (ref >59)

## 2021-12-05 MED ORDER — FUROSEMIDE 20 MG PO TABS: 40.0000 mg | ORAL_TABLET | Freq: Two times a day (BID) | ORAL | 0 refills | Status: DC

## 2021-12-05 NOTE — Patient Instructions (Signed)
INCREASE LASIX TO 40 MG ONE TWICE DAILY

## 2021-12-05 NOTE — Progress Notes (Unsigned)
Subjective:  Patient ID: Molly Parrish, female    DOB: 1966-06-10  Age: 56 y.o. MRN: [id number]  Chief Complaint  Patient presents with   Anemia    3 week follow up per [name]    HPI Hb dropped from 9.5    Patient has put on 10 lbs since she was seen on 11/12/2021.  I increased lasix to 20 mg twice daily and spirnolactone 2 twice a day.   CXR: pneumonia. Given antibiotics.   Current Outpatient Medications on File Prior to Visit  Medication Sig Dispense Refill   spironolactone (ALDACTONE) 50 MG tablet Take 100 mg by mouth 2 (two) times daily.     ALPRAZolam (XANAX) 0.25 MG tablet TAKE 1 TABLET BY MOUTH TWICE DAILY 60 tablet 2   Cyanocobalamin (VITAMIN B 12 PO) Take 1,000 mcg by mouth daily.     ergocalciferol (VITAMIN D2) 1.25 MG (50000 UT) capsule Take 1 capsule (50,000 Units total) by mouth 2 (two) times a week. 24 capsule 0   famotidine (PEPCID) 40 MG tablet Take 40 mg by mouth at bedtime.     hydrOXYzine (VISTARIL) 25 MG capsule TAKE 1 CAPSULE(25 MG) BY MOUTH THREE TIMES DAILY 90 capsule 2   Iron-Vitamins (GERITOL COMPLETE) TABS Take 1 tablet by mouth daily.     lamoTRIgine (LAMICTAL) 200 MG tablet Take 200 mg by mouth at bedtime.     levothyroxine (SYNTHROID) 175 MCG tablet TAKE 1 TABLET(175 MCG) BY MOUTH DAILY 30 tablet 2   magnesium oxide (MAG-OX) 400 MG tablet Take 400 mg by mouth daily.     nystatin (MYCOSTATIN) 100000 UNIT/ML suspension SHAKE LIQUID AND TAKE 5 ML BY MOUTH FOUR TIMES DAILY FOR 7 DAYS 473 mL 1   omeprazole (PRILOSEC) 40 MG capsule Take 1 capsule by mouth at bedtime.     promethazine (PHENERGAN) 25 MG tablet TAKE 1 TABLET BY MOUTH FOUR TIMES DAILY AS NEEDED FOR NAUSEA OR VOMITING 40 tablet 2   propranolol (INDERAL) 10 MG tablet Take 10 mg by mouth at bedtime.     QUEtiapine (SEROQUEL) 50 MG tablet Take 100 mg by mouth at bedtime.     rifaximin (XIFAXAN) 550 MG TABS tablet Take 550 mg by mouth 2 (two) times daily.     sertraline (ZOLOFT) 100 MG tablet  Take 100 mg by mouth in the morning and at bedtime.     sevelamer carbonate (RENVELA) 800 MG tablet Take 800 mg by mouth 3 (three) times daily with meals.     thiamine 100 MG tablet Take 100 mg by mouth daily.     traMADol (ULTRAM) 50 MG tablet TAKE 2 TABLETS BY MOUTH TWICE DAILY FOR BACK PAIN 120 tablet 2   triamcinolone (KENALOG) 0.1 % paste Use as directed 1 application in the mouth or throat 2 (two) times daily. 5 g 12   zolpidem (AMBIEN) 10 MG tablet Take 1 tablet (10 mg total) by mouth at bedtime as needed for sleep. STOP DAYVIGO 30 tablet 1   No current facility-administered medications on file prior to visit.   Past Medical History:  Diagnosis Date   Alcohol dependence in remission ([place]) 3/55/7322   Alcoholic cirrhosis of liver (HCC)    Anemia    Anemia of chronic disease 08/08/2016   Arterial hypotension 05/08/2020   Bipolar disorder current episode depressed ([place])    Brain TIA 02/19/2020   CKD (chronic kidney disease)    Episodic mood disorder (HCC) 11/07/2013   Generalized anxiety disorder  Generalized weakness 07/11/2020   GERD (gastroesophageal reflux disease)    H/O bariatric surgery 06/11/2020   Hypocalcemia 12/10/2015   Hypokalemia    Hyponatremia 02/19/2020   Intestinal malabsorption    Iron deficiency anemia due to chronic blood loss 05/08/2020   Korsakoff syndrome ([place])    Lumbar disc disease 10/19/2019   Microscopic hematuria 04/16/2021   Migraine 04/20/2014   Mild recurrent major depression ([place]) 04/19/2020   Multiple closed fractures of ribs of left side 06/03/2020   Formatting of this note might be different from the original. Added automatically from request for surgery [id number]   Neuropathy 01/31/2020   Obsessive-compulsive disorder 11/07/2013   Other osteoporosis without current pathological fracture    Peripheral edema 11/01/2020   Polypharmacy 06/12/2020   Portal hypertension ([place])    Post-surgical hypothyroidism 12/10/2015   Primary insomnia 03/26/2021   Severe  protein-calorie malnutrition ([place]) 03/26/2021   Shortness of breath 11/01/2020   Stage 3b chronic kidney disease ([place]) 04/16/2021   Stroke ([place])     left basal ganglia stroke (hemorrhagic)   Telogen effluvium 03/26/2021   Ulcer of left foot with fat layer exposed ([place]) 10/04/2020   Ulcer of right foot, with fat layer exposed ([place]) 10/10/2020   Vitamin D deficiency    Past Surgical History:  Procedure Laterality Date   BREAST EXCISIONAL BIOPSY Left    CATARACT EXTRACTION     GASTRIC BYPASS  2005   HEMORRHOID SURGERY     HERNIA REPAIR     metatarsus abductus surgery Left 1990   PARTIAL HYSTERECTOMY  05/1997   BL Ovaries remain. Done for fibroids.   THYROIDECTOMY  03/2013    Family History  Problem Relation Age of Onset   Breast cancer Mother    Osteoporosis Mother    Cancer Mother        Breast, lung, skin.    Breast cancer Maternal Grandmother    Social History   Socioeconomic History   Marital status: Married    Spouse name: Not on file   Number of children: 1   Years of education: Not on file   Highest education level: Not on file  Occupational History   Occupation: Engineer, maintenance (IT)    Comment: Retired.  Tobacco Use   Smoking status: Never   Smokeless tobacco: Never  Substance and Sexual Activity   Alcohol use: Not Currently    Comment: history of alcoholism. sober since 2014.   Drug use: Never   Sexual activity: Yes  Other Topics Concern   Not on file  Social History Narrative   Right handed   Social Determinants of Health   Financial Resource Strain: Low Risk    Difficulty of Paying Living Expenses: Not hard at all  Food Insecurity: No Food Insecurity   Worried About Charity fundraiser in the Last Year: Never true   [place] in the Last Year: Never true  Transportation Needs: No Transportation Needs   Lack of Transportation (Medical): No   Lack of Transportation (Non-Medical): No  Physical Activity: Not on file  Stress: Not on file  Social Connections: Not on  file    Review of Systems  Constitutional:  Negative for appetite change, fatigue and fever.  HENT:  Negative for congestion, ear pain, sinus pressure and sore throat.   Respiratory:  Positive for shortness of breath. Negative for cough, chest tightness and wheezing.   Cardiovascular:  Negative for chest pain and palpitations.  Gastrointestinal:  Positive for nausea and  vomiting. Negative for abdominal pain, constipation and diarrhea.  Genitourinary:  Negative for dysuria and hematuria.       Pressure discomfort  Musculoskeletal:  Positive for back pain and neck pain. Negative for arthralgias, joint swelling and myalgias.  Skin:  Negative for rash.  Neurological:  Positive for light-headedness and headaches. Negative for dizziness and weakness.  Psychiatric/Behavioral:  Negative for dysphoric mood. The patient is not nervous/anxious.     Objective:  BP 104/64   Pulse 80   Temp (!) 97.4 F (36.3 C)   Resp 14   Ht '5\' 6"'$  (1.676 m)   Wt 146 lb (66.2 kg)   BMI 23.57 kg/m      12/05/2021   10:56 AM 11/26/2021   11:30 AM 11/19/2021    3:08 PM  BP/Weight  Systolic BP 793 903 009  Diastolic BP 64 67 72  Wt. (Lbs) 146 135 145  BMI 23.57 kg/m2 21.79 kg/m2 23.4 kg/m2    Physical Exam Vitals reviewed.  Constitutional:      Appearance: Normal appearance. She is normal weight.  Cardiovascular:     Rate and Rhythm: Normal rate and regular rhythm.     Pulses: Normal pulses.     Heart sounds: Normal heart sounds.  Pulmonary:     Effort: Pulmonary effort is normal.     Breath sounds: Normal breath sounds.  Abdominal:     General: Abdomen is flat. Bowel sounds are normal.     Palpations: Abdomen is soft.  Neurological:     Mental Status: She is alert and oriented to person, place, and time.  Psychiatric:        Mood and Affect: Mood normal.        Behavior: Behavior normal.    Diabetic Foot Exam - Simple   No data filed      Lab Results  Component Value Date   WBC 11.9  11/26/2021   HGB 8.8 (A) 11/26/2021   HCT 29 (A) 11/26/2021   PLT 414 (A) 11/26/2021   GLUCOSE 86 12/05/2021   CHOL 141 10/22/2021   TRIG 51 10/22/2021   HDL 71 10/22/2021   LDLCALC 59 10/22/2021   ALT 17 12/05/2021   AST 19 12/05/2021   NA 136 12/05/2021   K 3.8 12/05/2021   CL 99 12/05/2021   CREATININE 1.04 (H) 12/05/2021   BUN 31 (H) 12/05/2021   CO2 26 12/05/2021   TSH 0.297 (L) 10/22/2021      Assessment & Plan:   Problem List Items Addressed This Visit       Genitourinary   Pyelonephritis    Antibiotics was given.         Other   Hyponatremia    Check CMP       Relevant Orders   Comprehensive metabolic panel (Completed)   History of sepsis - Primary   Iron deficiency anemia due to chronic blood loss     Total time spent on today's visit was greater than 30 minutes, including both face-to-face time and nonface-to-face time personally spent on review of chart (labs and imaging), discussing labs and goals, discussing further work-up, treatment options, referrals to specialist if needed, reviewing outside records of pertinent, answering patient's questions, and coordinating care.   Follow-up: Return in about 4 weeks (around 01/02/2022) for chronic follow up.  An After Visit Summary was printed and given to the patient.    I,Lauren M Auman,acting as a scribe for Rochel Brome, MD.,have documented all relevant documentation on  the behalf of Rochel Brome, MD,as directed by  Rochel Brome, MD while in the presence of Rochel Brome, MD.   I,Decorey Wahlert,acting as a scribe for Rochel Brome, MD.,have documented all relevant documentation on the behalf of Rochel Brome, MD,as directed by  Rochel Brome, MD while in the presence of Rochel Brome, MD.  Rochel Brome, MD [place] [phone]

## 2021-12-07 ENCOUNTER — Other Ambulatory Visit: Payer: Self-pay | Admitting: Family Medicine

## 2021-12-07 DIAGNOSIS — R110 Nausea: Secondary | ICD-10-CM

## 2021-12-07 NOTE — Assessment & Plan Note (Signed)
Multifactorial.  Patient has had an EGD and a colonoscopy both of which showed no source of bleeding. Patient has been referred to hematology.

## 2021-12-07 NOTE — Assessment & Plan Note (Signed)
Sent chemistry panel.  Follows up with nephrology.

## 2021-12-07 NOTE — Assessment & Plan Note (Signed)
Secondary to liver cirrhosis.

## 2021-12-07 NOTE — Assessment & Plan Note (Signed)
Management per psychiatry 

## 2021-12-07 NOTE — Assessment & Plan Note (Signed)
Secondary to gastric bypass and liver cirrhosis.

## 2021-12-07 NOTE — Assessment & Plan Note (Signed)
Currently on furosemide and spironolactone.

## 2021-12-10 ENCOUNTER — Encounter: Payer: Self-pay | Admitting: Oncology

## 2021-12-11 DIAGNOSIS — E892 Postprocedural hypoparathyroidism: Secondary | ICD-10-CM | POA: Insufficient documentation

## 2021-12-13 DIAGNOSIS — N12 Tubulo-interstitial nephritis, not specified as acute or chronic: Secondary | ICD-10-CM | POA: Insufficient documentation

## 2021-12-13 DIAGNOSIS — Z8619 Personal history of other infectious and parasitic diseases: Secondary | ICD-10-CM | POA: Insufficient documentation

## 2021-12-13 NOTE — Assessment & Plan Note (Signed)
Check CMP.  ?

## 2021-12-13 NOTE — Assessment & Plan Note (Signed)
Antibiotics was given.

## 2021-12-15 ENCOUNTER — Encounter: Payer: Self-pay | Admitting: Family Medicine

## 2021-12-17 DIAGNOSIS — N1832 Chronic kidney disease, stage 3b: Secondary | ICD-10-CM

## 2021-12-17 DIAGNOSIS — K7460 Unspecified cirrhosis of liver: Secondary | ICD-10-CM

## 2021-12-25 ENCOUNTER — Telehealth: Payer: Medicare Other

## 2021-12-26 ENCOUNTER — Other Ambulatory Visit: Payer: Self-pay | Admitting: Family Medicine

## 2021-12-28 ENCOUNTER — Other Ambulatory Visit: Payer: Self-pay | Admitting: Family Medicine

## 2022-01-02 ENCOUNTER — Ambulatory Visit (INDEPENDENT_AMBULATORY_CARE_PROVIDER_SITE_OTHER): Payer: 59 | Admitting: Family Medicine

## 2022-01-02 ENCOUNTER — Encounter: Payer: Self-pay | Admitting: Family Medicine

## 2022-01-02 VITALS — BP 100/64 | HR 89 | Resp 18 | Ht 65.0 in | Wt 147.0 lb

## 2022-01-02 DIAGNOSIS — R601 Generalized edema: Secondary | ICD-10-CM | POA: Diagnosis not present

## 2022-01-02 DIAGNOSIS — K7030 Alcoholic cirrhosis of liver without ascites: Secondary | ICD-10-CM | POA: Diagnosis not present

## 2022-01-02 DIAGNOSIS — R635 Abnormal weight gain: Secondary | ICD-10-CM

## 2022-01-02 DIAGNOSIS — R252 Cramp and spasm: Secondary | ICD-10-CM | POA: Diagnosis not present

## 2022-01-02 NOTE — Patient Instructions (Signed)
Increase lasix 40 mg 2 tablets three times a day.

## 2022-01-02 NOTE — Progress Notes (Unsigned)
Subjective:  Patient ID: Molly Parrish, female    DOB: 13-Dec-1965  Age: 56 y.o. MRN: [id number]  Chief Complaint  Patient presents with   weight change   HPI   Current Outpatient Medications on File Prior to Visit  Medication Sig Dispense Refill   ALPRAZolam (XANAX) 0.25 MG tablet TAKE 1 TABLET BY MOUTH TWICE DAILY 60 tablet 0   Cyanocobalamin (VITAMIN B 12 PO) Take 1,000 mcg by mouth daily.     ergocalciferol (VITAMIN D2) 1.25 MG (50000 UT) capsule Take 1 capsule (50,000 Units total) by mouth 2 (two) times a week. 24 capsule 0   famotidine (PEPCID) 40 MG tablet Take 40 mg by mouth at bedtime.     furosemide (LASIX) 40 MG tablet TAKE 2 TABLETS BY MOUTH TWICE DAILY 360 tablet 1   hydrOXYzine (VISTARIL) 25 MG capsule TAKE 1 CAPSULE(25 MG) BY MOUTH THREE TIMES DAILY 90 capsule 2   Iron-Vitamins (GERITOL COMPLETE) TABS Take 1 tablet by mouth daily.     lamoTRIgine (LAMICTAL) 200 MG tablet Take 200 mg by mouth at bedtime.     levothyroxine (SYNTHROID) 175 MCG tablet TAKE 1 TABLET(175 MCG) BY MOUTH DAILY 30 tablet 2   magnesium oxide (MAG-OX) 400 MG tablet Take 400 mg by mouth daily.     nystatin (MYCOSTATIN) 100000 UNIT/ML suspension SHAKE LIQUID AND TAKE 5 ML BY MOUTH FOUR TIMES DAILY FOR 7 DAYS 473 mL 1   omeprazole (PRILOSEC) 40 MG capsule Take 1 capsule by mouth at bedtime.     ondansetron (ZOFRAN) 4 MG tablet TAKE 1 TABLET(4 MG) BY MOUTH EVERY 8 HOURS AS NEEDED FOR NAUSEA OR VOMITING 60 tablet 0   promethazine (PHENERGAN) 25 MG tablet TAKE 1 TABLET BY MOUTH FOUR TIMES DAILY AS NEEDED FOR NAUSEA OR VOMITING 40 tablet 2   propranolol (INDERAL) 10 MG tablet Take 10 mg by mouth at bedtime.     QUEtiapine (SEROQUEL) 50 MG tablet Take 100 mg by mouth at bedtime.     rifaximin (XIFAXAN) 550 MG TABS tablet Take 550 mg by mouth 2 (two) times daily.     sertraline (ZOLOFT) 100 MG tablet Take 100 mg by mouth in the morning and at bedtime.     sevelamer carbonate (RENVELA) 800 MG tablet Take  800 mg by mouth 3 (three) times daily with meals.     spironolactone (ALDACTONE) 50 MG tablet Take 100 mg by mouth 2 (two) times daily.     thiamine 100 MG tablet Take 100 mg by mouth daily.     traMADol (ULTRAM) 50 MG tablet TAKE 2 TABLETS BY MOUTH TWICE DAILY FOR BACK PAIN 120 tablet 2   triamcinolone (KENALOG) 0.1 % paste Use as directed 1 application in the mouth or throat 2 (two) times daily. 5 g 12   zolpidem (AMBIEN) 10 MG tablet Take 1 tablet (10 mg total) by mouth at bedtime as needed for sleep. STOP DAYVIGO 30 tablet 1   No current facility-administered medications on file prior to visit.   Past Medical History:  Diagnosis Date   Alcohol dependence in remission ([employer]) 8/93/8101   Alcoholic cirrhosis of liver (HCC)    Anemia    Anemia of chronic disease 08/08/2016   Arterial hypotension 05/08/2020   Bipolar disorder current episode depressed ([place])    Brain TIA 02/19/2020   CKD (chronic kidney disease)    Episodic mood disorder (HCC) 11/07/2013   Generalized anxiety disorder    Generalized weakness 07/11/2020   GERD (gastroesophageal  reflux disease)    H/O bariatric surgery 06/11/2020   Hypocalcemia 12/10/2015   Hypokalemia    Hyponatremia 02/19/2020   Intestinal malabsorption    Iron deficiency anemia due to chronic blood loss 05/08/2020   Korsakoff syndrome ([place])    Lumbar disc disease 10/19/2019   Microscopic hematuria 04/16/2021   Migraine 04/20/2014   Mild recurrent major depression ([place]) 04/19/2020   Multiple closed fractures of ribs of left side 06/03/2020   Formatting of this note might be different from the original. Added automatically from request for surgery [id number]   Neuropathy 01/31/2020   Obsessive-compulsive disorder 11/07/2013   Other osteoporosis without current pathological fracture    Peripheral edema 11/01/2020   Polypharmacy 06/12/2020   Portal hypertension ([place])    Post-surgical hypothyroidism 12/10/2015   Primary insomnia 03/26/2021   Severe protein-calorie  malnutrition ([place]) 03/26/2021   Shortness of breath 11/01/2020   Stage 3b chronic kidney disease ([place]) 04/16/2021   Stroke ([place])     left basal ganglia stroke (hemorrhagic)   Telogen effluvium 03/26/2021   Ulcer of left foot with fat layer exposed ([place]) 10/04/2020   Ulcer of right foot, with fat layer exposed ([place]) 10/10/2020   Vitamin D deficiency    Past Surgical History:  Procedure Laterality Date   BREAST EXCISIONAL BIOPSY Left    CATARACT EXTRACTION     GASTRIC BYPASS  2005   HEMORRHOID SURGERY     HERNIA REPAIR     metatarsus abductus surgery Left 1990   PARTIAL HYSTERECTOMY  05/1997   BL Ovaries remain. Done for fibroids.   THYROIDECTOMY  03/2013    Family History  Problem Relation Age of Onset   Breast cancer Mother    Osteoporosis Mother    Cancer Mother        Breast, lung, skin.    Breast cancer Maternal Grandmother    Social History   Socioeconomic History   Marital status: Married    Spouse name: Not on file   Number of children: 1   Years of education: Not on file   Highest education level: Not on file  Occupational History   Occupation: Engineer, maintenance (IT)    Comment: Retired.  Tobacco Use   Smoking status: Never   Smokeless tobacco: Never  Substance and Sexual Activity   Alcohol use: Not Currently    Comment: history of alcoholism. sober since 2014.   Drug use: Never   Sexual activity: Yes  Other Topics Concern   Not on file  Social History Narrative   Right handed   Social Determinants of Health   Financial Resource Strain: Low Risk  (05/20/2021)   Overall Financial Resource Strain (CARDIA)    Difficulty of Paying Living Expenses: Not hard at all  Food Insecurity: No Food Insecurity (05/20/2021)   Hunger Vital Sign    Worried About Running Out of Food in the Last Year: Never true    Ran Out of Food in the Last Year: Never true  Transportation Needs: No Transportation Needs (05/20/2021)   PRAPARE - Hydrologist (Medical): No    Lack  of Transportation (Non-Medical): No  Physical Activity: Not on file  Stress: Not on file  Social Connections: Not on file    Review of Systems  Constitutional:  Positive for appetite change and unexpected weight change (increase). Negative for fatigue and fever.  HENT:  Negative for congestion, ear pain, sinus pressure and sore throat.   Eyes:  Negative for pain.  Respiratory:  Positive for shortness of breath. Negative for cough and wheezing.   Cardiovascular:  Negative for chest pain and palpitations.  Gastrointestinal:  Positive for nausea. Negative for abdominal pain, constipation, diarrhea and vomiting.  Genitourinary:  Negative for dysuria and frequency.  Musculoskeletal:  Positive for myalgias (cramping). Negative for arthralgias, back pain and joint swelling.  Skin:  Negative for rash.  Neurological:  Negative for dizziness, weakness and headaches.  Psychiatric/Behavioral:  Negative for dysphoric mood. The patient is not nervous/anxious.      Objective:  BP 100/64   Pulse 89   Resp 18   Ht '5\' 5"'$  (1.651 m)   Wt 147 lb (66.7 kg)   SpO2 96%   BMI 24.46 kg/m      01/02/2022   11:12 AM 12/05/2021   10:56 AM 11/26/2021   11:30 AM  BP/Weight  Systolic BP 119 417 408  Diastolic BP 64 64 67  Wt. (Lbs) 147 146 135  BMI 24.46 kg/m2 23.57 kg/m2 21.79 kg/m2    Physical Exam Vitals reviewed.  Constitutional:      Appearance: Normal appearance. She is normal weight.  HENT:     Right Ear: Tympanic membrane normal.     Left Ear: Tympanic membrane normal.     Nose: Nose normal.  Cardiovascular:     Rate and Rhythm: Normal rate and regular rhythm.     Pulses: Normal pulses.     Heart sounds: Normal heart sounds.  Pulmonary:     Effort: Pulmonary effort is normal.     Breath sounds: Normal breath sounds.  Abdominal:     General: Bowel sounds are normal.  Neurological:     Mental Status: She is oriented to person, place, and time. Mental status is at baseline.   Psychiatric:        Mood and Affect: Mood normal.        Behavior: Behavior normal.     Diabetic Foot Exam - Simple   No data filed      Lab Results  Component Value Date   WBC 11.9 11/26/2021   HGB 8.8 (A) 11/26/2021   HCT 29 (A) 11/26/2021   PLT 414 (A) 11/26/2021   GLUCOSE 86 12/05/2021   CHOL 141 10/22/2021   TRIG 51 10/22/2021   HDL 71 10/22/2021   LDLCALC 59 10/22/2021   ALT 17 12/05/2021   AST 19 12/05/2021   NA 136 12/05/2021   K 3.8 12/05/2021   CL 99 12/05/2021   CREATININE 1.04 (H) 12/05/2021   BUN 31 (H) 12/05/2021   CO2 26 12/05/2021   TSH 0.297 (L) 10/22/2021      Assessment & Plan:   Problem List Items Addressed This Visit   None .  No orders of the defined types were placed in this encounter.   No orders of the defined types were placed in this encounter.    Follow-up: No follow-ups on file.  An After Visit Summary was printed and given to the patient.  I,Equan Cogbill C Teller [place],acting as a scribe for Rochel Brome, MD.,have documented all relevant documentation on the behalf of Rochel Brome, MD,as directed by  Rochel Brome, MD while in the presence of Rochel Brome, MD.   Rochel Brome, MD [place] [phone]

## 2022-01-03 LAB — CBC WITH DIFFERENTIAL/PLATELET
Basophils Absolute: 0.1 10*3/uL (ref 0.0–0.2)
Basos: 1 %
EOS (ABSOLUTE): 0.3 10*3/uL (ref 0.0–0.4)
Eos: 2 %
Hematocrit: 32.5 % — ABNORMAL LOW (ref 34.0–46.6)
Hemoglobin: 10.7 g/dL — ABNORMAL LOW (ref 11.1–15.9)
Immature Grans (Abs): 0 10*3/uL (ref 0.0–0.1)
Immature Granulocytes: 0 %
Lymphocytes Absolute: 1.5 10*3/uL (ref 0.7–3.1)
Lymphs: 9 %
MCH: 28.6 pg (ref 26.6–33.0)
MCHC: 32.9 g/dL (ref 31.5–35.7)
MCV: 87 fL (ref 79–97)
Monocytes Absolute: 1 10*3/uL — ABNORMAL HIGH (ref 0.1–0.9)
Monocytes: 6 %
Neutrophils Absolute: 13.2 10*3/uL — ABNORMAL HIGH (ref 1.4–7.0)
Neutrophils: 82 %
Platelets: 306 10*3/uL (ref 150–450)
RBC: 3.74 x10E6/uL — ABNORMAL LOW (ref 3.77–5.28)
RDW: 14.2 % (ref 11.7–15.4)
WBC: 16.2 10*3/uL — ABNORMAL HIGH (ref 3.4–10.8)

## 2022-01-03 LAB — COMPREHENSIVE METABOLIC PANEL
ALT: 19 IU/L (ref 0–32)
AST: 21 IU/L (ref 0–40)
Albumin/Globulin Ratio: 2 (ref 1.2–2.2)
Albumin: 4 g/dL (ref 3.8–4.9)
Alkaline Phosphatase: 189 IU/L — ABNORMAL HIGH (ref 44–121)
BUN/Creatinine Ratio: 28 — ABNORMAL HIGH (ref 9–23)
BUN: 40 mg/dL — ABNORMAL HIGH (ref 6–24)
Bilirubin Total: 0.3 mg/dL (ref 0.0–1.2)
CO2: 23 mmol/L (ref 20–29)
Calcium: 8.6 mg/dL — ABNORMAL LOW (ref 8.7–10.2)
Chloride: 96 mmol/L (ref 96–106)
Creatinine, Ser: 1.42 mg/dL — ABNORMAL HIGH (ref 0.57–1.00)
Globulin, Total: 2 g/dL (ref 1.5–4.5)
Glucose: 89 mg/dL (ref 70–99)
Potassium: 4.5 mmol/L (ref 3.5–5.2)
Sodium: 135 mmol/L (ref 134–144)
Total Protein: 6 g/dL (ref 6.0–8.5)
eGFR: 43 mL/min/{1.73_m2} — ABNORMAL LOW (ref >59)

## 2022-01-03 LAB — TSH: TSH: 0.046 u[IU]/mL — ABNORMAL LOW (ref 0.450–4.500)

## 2022-01-03 LAB — MAGNESIUM: Magnesium: 2.4 mg/dL — ABNORMAL HIGH (ref 1.6–2.3)

## 2022-01-03 LAB — PHOSPHORUS: Phosphorus: 5.2 mg/dL — ABNORMAL HIGH (ref 3.0–4.3)

## 2022-01-05 ENCOUNTER — Other Ambulatory Visit: Payer: Self-pay | Admitting: Family Medicine

## 2022-01-05 ENCOUNTER — Other Ambulatory Visit: Payer: Self-pay

## 2022-01-05 DIAGNOSIS — K140 Glossitis: Secondary | ICD-10-CM

## 2022-01-05 MED ORDER — LEVOTHYROXINE SODIUM 150 MCG PO TABS: 150.0000 ug | ORAL_TABLET | Freq: Every day | ORAL | 0 refills | Status: DC

## 2022-01-11 DIAGNOSIS — R635 Abnormal weight gain: Secondary | ICD-10-CM | POA: Insufficient documentation

## 2022-01-11 DIAGNOSIS — R252 Cramp and spasm: Secondary | ICD-10-CM | POA: Insufficient documentation

## 2022-01-11 NOTE — Assessment & Plan Note (Signed)
Check labs 

## 2022-01-12 DIAGNOSIS — R601 Generalized edema: Secondary | ICD-10-CM | POA: Insufficient documentation

## 2022-01-12 NOTE — Assessment & Plan Note (Signed)
Increase lasix 40 mg 2 tablets three times a day.

## 2022-01-15 ENCOUNTER — Other Ambulatory Visit: Payer: Self-pay | Admitting: Family Medicine

## 2022-01-19 ENCOUNTER — Telehealth: Payer: Self-pay

## 2022-01-19 NOTE — Telephone Encounter (Signed)
Called patient to let her know she should go be seen today, being that we are closed tomorrow. Provider doesn't want patient waiting till Wednesday to be seen for UTI. Left detail message for patient.

## 2022-01-19 NOTE — Telephone Encounter (Signed)
Patient called stated she took an at home UA and it showed positive for leukocytes, and Nitrite. Patient wanted to know if something could be called in for her or should she go to urgent care? Please advise. Patient uses [facility] in [place].

## 2022-01-21 ENCOUNTER — Ambulatory Visit (INDEPENDENT_AMBULATORY_CARE_PROVIDER_SITE_OTHER): Payer: 59 | Admitting: Family Medicine

## 2022-01-21 VITALS — BP 106/50 | HR 84 | Temp 97.8°F | Resp 14 | Ht 66.0 in | Wt 149.0 lb

## 2022-01-21 DIAGNOSIS — N3000 Acute cystitis without hematuria: Secondary | ICD-10-CM | POA: Diagnosis not present

## 2022-01-21 LAB — POCT URINALYSIS DIPSTICK
Bilirubin, UA: NEGATIVE
Blood, UA: NEGATIVE
Glucose, UA: NEGATIVE
Ketones, UA: NEGATIVE
Nitrite, UA: NEGATIVE
Protein, UA: NEGATIVE
Spec Grav, UA: 1.01 (ref 1.010–1.025)
Urobilinogen, UA: 0.2 E.U./dL
pH, UA: 6 (ref 5.0–8.0)

## 2022-01-21 MED ORDER — NITROFURANTOIN MONOHYD MACRO 100 MG PO CAPS: 100.0000 mg | ORAL_CAPSULE | Freq: Two times a day (BID) | ORAL | 0 refills | Status: DC

## 2022-01-21 NOTE — Telephone Encounter (Signed)
Appt made Wednesday. (Today.)

## 2022-01-21 NOTE — Progress Notes (Unsigned)
Acute Office Visit  Subjective:    Patient ID: Molly Parrish, female    DOB: 10-12-1965, 56 y.o.   MRN: [id number]  Chief Complaint  Patient presents with   Urinary Urgency   Back Pain   Nausea    HPI: Patient is in today for urinary urgency, back pain and nausea.  She also has been experiencing low pelvic pressure.  Her symptoms started on Sunday.    Past Medical History:  Diagnosis Date   Alcohol dependence in remission ([place]) 10/16/5186   Alcoholic cirrhosis of liver (HCC)    Anemia    Anemia of chronic disease 08/08/2016   Arterial hypotension 05/08/2020   Bipolar disorder current episode depressed ([place])    Brain TIA 02/19/2020   CKD (chronic kidney disease)    Episodic mood disorder (HCC) 11/07/2013   Generalized anxiety disorder    Generalized weakness 07/11/2020   GERD (gastroesophageal reflux disease)    H/O bariatric surgery 06/11/2020   Hypocalcemia 12/10/2015   Hypokalemia    Hyponatremia 02/19/2020   Intestinal malabsorption    Iron deficiency anemia due to chronic blood loss 05/08/2020   Korsakoff syndrome ([place])    Lumbar disc disease 10/19/2019   Microscopic hematuria 04/16/2021   Migraine 04/20/2014   Mild recurrent major depression ([place]) 04/19/2020   Multiple closed fractures of ribs of left side 06/03/2020   Formatting of this note might be different from the original. Added automatically from request for surgery [id number]   Neuropathy 01/31/2020   Obsessive-compulsive disorder 11/07/2013   Other osteoporosis without current pathological fracture    Peripheral edema 11/01/2020   Polypharmacy 06/12/2020   Portal hypertension ([place])    Post-surgical hypothyroidism 12/10/2015   Primary insomnia 03/26/2021   Severe protein-calorie malnutrition ([place]) 03/26/2021   Shortness of breath 11/01/2020   Stage 3b chronic kidney disease ([place]) 04/16/2021   Stroke ([place])     left basal ganglia stroke (hemorrhagic)   Telogen effluvium 03/26/2021   Ulcer of left foot with fat layer  exposed ([place]) 10/04/2020   Ulcer of right foot, with fat layer exposed ([place]) 10/10/2020   Vitamin D deficiency     Past Surgical History:  Procedure Laterality Date   BREAST EXCISIONAL BIOPSY Left    CATARACT EXTRACTION     GASTRIC BYPASS  2005   HEMORRHOID SURGERY     HERNIA REPAIR     metatarsus abductus surgery Left 1990   PARTIAL HYSTERECTOMY  05/1997   BL Ovaries remain. Done for fibroids.   THYROIDECTOMY  03/2013    Family History  Problem Relation Age of Onset   Breast cancer Mother    Osteoporosis Mother    Cancer Mother        Breast, lung, skin.    Breast cancer Maternal Grandmother     Social History   Socioeconomic History   Marital status: Married    Spouse name: Not on file   Number of children: 1   Years of education: Not on file   Highest education level: Not on file  Occupational History   Occupation: Engineer, maintenance (IT)    Comment: Retired.  Tobacco Use   Smoking status: Never   Smokeless tobacco: Never  Substance and Sexual Activity   Alcohol use: Not Currently    Comment: history of alcoholism. sober since 2014.   Drug use: Never   Sexual activity: Yes  Other Topics Concern   Not on file  Social History Narrative   Right handed   Social Determinants  of Health   Financial Resource Strain: Low Risk  (05/20/2021)   Overall Financial Resource Strain (CARDIA)    Difficulty of Paying Living Expenses: Not hard at all  Food Insecurity: No Food Insecurity (05/20/2021)   Hunger Vital Sign    Worried About Running Out of Food in the Last Year: Never true    Ran Out of Food in the Last Year: Never true  Transportation Needs: No Transportation Needs (05/20/2021)   PRAPARE - Hydrologist (Medical): No    Lack of Transportation (Non-Medical): No  Physical Activity: Not on file  Stress: Not on file  Social Connections: Not on file  Intimate Partner Violence: Not At Risk (05/20/2021)   Humiliation, Afraid, Rape, and Kick questionnaire     Fear of Current or Ex-Partner: No    Emotionally Abused: No    Physically Abused: No    Sexually Abused: No    Outpatient Medications Prior to Visit  Medication Sig Dispense Refill   ALPRAZolam (XANAX) 0.25 MG tablet TAKE 1 TABLET BY MOUTH TWICE DAILY 60 tablet 0   Cyanocobalamin (VITAMIN B 12 PO) Take 1,000 mcg by mouth daily.     famotidine (PEPCID) 40 MG tablet Take 40 mg by mouth at bedtime.     furosemide (LASIX) 40 MG tablet TAKE 2 TABLETS BY MOUTH TWICE DAILY 360 tablet 1   hydrOXYzine (VISTARIL) 25 MG capsule TAKE 1 CAPSULE(25 MG) BY MOUTH THREE TIMES DAILY 90 capsule 2   Iron-Vitamins (GERITOL COMPLETE) TABS Take 1 tablet by mouth daily.     lamoTRIgine (LAMICTAL) 200 MG tablet Take 200 mg by mouth at bedtime.     levothyroxine (SYNTHROID) 150 MCG tablet Take 1 tablet (150 mcg total) by mouth daily. 90 tablet 0   magnesium oxide (MAG-OX) 400 MG tablet Take 400 mg by mouth daily.     nystatin (MYCOSTATIN) 100000 UNIT/ML suspension SHAKE LIQUID AND TAKE 5 ML BY MOUTH FOUR TIMES DAILY FOR 7 DAYS 473 mL 1   omeprazole (PRILOSEC) 40 MG capsule Take 1 capsule by mouth at bedtime.     ondansetron (ZOFRAN) 4 MG tablet TAKE 1 TABLET(4 MG) BY MOUTH EVERY 8 HOURS AS NEEDED FOR NAUSEA OR VOMITING 60 tablet 0   promethazine (PHENERGAN) 25 MG tablet TAKE 1 TABLET BY MOUTH FOUR TIMES DAILY AS NEEDED FOR NAUSEA OR VOMITING 40 tablet 2   propranolol (INDERAL) 10 MG tablet Take 10 mg by mouth at bedtime.     QUEtiapine (SEROQUEL) 50 MG tablet Take 100 mg by mouth at bedtime.     rifaximin (XIFAXAN) 550 MG TABS tablet Take 550 mg by mouth 2 (two) times daily.     sertraline (ZOLOFT) 100 MG tablet Take 100 mg by mouth in the morning and at bedtime.     sevelamer carbonate (RENVELA) 800 MG tablet Take 800 mg by mouth 3 (three) times daily with meals.     spironolactone (ALDACTONE) 50 MG tablet Take 100 mg by mouth 2 (two) times daily.     thiamine 100 MG tablet Take 100 mg by mouth daily.      traMADol (ULTRAM) 50 MG tablet TAKE 2 TABLETS BY MOUTH TWICE DAILY FOR BACK PAIN 120 tablet 2   triamcinolone (KENALOG) 0.1 % paste Use as directed 1 application in the mouth or throat 2 (two) times daily. 5 g 12   zolpidem (AMBIEN) 10 MG tablet TAKE 1 TABLET(10 MG) BY MOUTH AT BEDTIME AS NEEDED FOR SLEEP. STOP DAYVIGO  30 tablet 2   ergocalciferol (VITAMIN D2) 1.25 MG (50000 UT) capsule Take 1 capsule (50,000 Units total) by mouth 2 (two) times a week. 24 capsule 0   No facility-administered medications prior to visit.    Allergies  Allergen Reactions   Cholestyramine     Rash and itching   Trintellix [Vortioxetine]     Review of Systems  Constitutional:  Negative for fever.  Gastrointestinal:  Positive for abdominal distention, abdominal pain (pressure) and nausea.  Genitourinary:  Positive for urgency. Negative for dysuria.  Musculoskeletal:  Positive for back pain.       Objective:    Physical Exam Vitals reviewed.  Constitutional:      Appearance: Normal appearance. She is normal weight.  Neck:     Vascular: No carotid bruit.  Cardiovascular:     Rate and Rhythm: Normal rate and regular rhythm.     Heart sounds: Murmur heard.  Pulmonary:     Effort: Pulmonary effort is normal. No respiratory distress.     Breath sounds: Normal breath sounds.  Abdominal:     General: Abdomen is flat. Bowel sounds are normal.     Palpations: Abdomen is soft.     Tenderness: There is no abdominal tenderness.  Neurological:     Mental Status: She is alert and oriented to person, place, and time.  Psychiatric:        Mood and Affect: Mood normal.        Behavior: Behavior normal.     BP (!) 106/50   Pulse 84   Temp 97.8 F (36.6 C)   Resp 14   Ht 5' 6"  (1.676 m)   Wt 149 lb (67.6 kg)   BMI 24.05 kg/m  Wt Readings from Last 3 Encounters:  01/21/22 149 lb (67.6 kg)  01/02/22 147 lb (66.7 kg)  12/05/21 146 lb (66.2 kg)    Health Maintenance Due  Topic Date Due   PAP  SMEAR-Modifier  Never done   Zoster Vaccines- Shingrix (2 of 2) 10/21/2020    There are no preventive care reminders to display for this patient.   Lab Results  Component Value Date   TSH 0.046 (L) 01/02/2022   Lab Results  Component Value Date   WBC 16.2 (H) 01/02/2022   HGB 10.7 (L) 01/02/2022   HCT 32.5 (L) 01/02/2022   MCV 87 01/02/2022   PLT 306 01/02/2022   Lab Results  Component Value Date   NA 135 01/02/2022   K 4.5 01/02/2022   CO2 23 01/02/2022   GLUCOSE 89 01/02/2022   BUN 40 (H) 01/02/2022   CREATININE 1.42 (H) 01/02/2022   BILITOT 0.3 01/02/2022   ALKPHOS 189 (H) 01/02/2022   AST 21 01/02/2022   ALT 19 01/02/2022   PROT 6.0 01/02/2022   ALBUMIN 4.0 01/02/2022   CALCIUM 8.6 (L) 01/02/2022   EGFR 43 (L) 01/02/2022   Lab Results  Component Value Date   CHOL 141 10/22/2021   Lab Results  Component Value Date   HDL 71 10/22/2021   Lab Results  Component Value Date   LDLCALC 59 10/22/2021   Lab Results  Component Value Date   TRIG 51 10/22/2021   Lab Results  Component Value Date   CHOLHDL 2.0 10/22/2021   No results found for: "HGBA1C"     Assessment & Plan:   Problem List Items Addressed This Visit       Genitourinary   Acute cystitis without hematuria - Primary  Check UA and urine culture. Started Macrobid 100 mg BID      Relevant Orders   POCT urinalysis dipstick (Completed)   Urine Culture (Completed)   Meds ordered this encounter  Medications   nitrofurantoin, macrocrystal-monohydrate, (MACROBID) 100 MG capsule    Sig: Take 1 capsule (100 mg total) by mouth 2 (two) times daily.    Dispense:  14 capsule    Refill:  0    Orders Placed This Encounter  Procedures   Urine Culture   POCT urinalysis dipstick    I,Marla I Leal-Borjas,acting as a scribe for Rochel Brome, MD.,have documented all relevant documentation on the behalf of Rochel Brome, MD,as directed by  Rochel Brome, MD while in the presence of Rochel Brome, MD.    Follow-up: No follow-ups on file.  An After Visit Summary was printed and given to the patient.  Rochel Brome, MD [facility] [phone]

## 2022-01-22 ENCOUNTER — Telehealth: Payer: Medicare Other

## 2022-01-22 ENCOUNTER — Other Ambulatory Visit: Payer: Self-pay | Admitting: Family Medicine

## 2022-01-22 LAB — URINE CULTURE

## 2022-01-24 NOTE — Assessment & Plan Note (Signed)
Check UA and urine culture. Started Macrobid 100 mg BID

## 2022-01-26 ENCOUNTER — Encounter: Payer: Self-pay | Admitting: Family Medicine

## 2022-01-28 ENCOUNTER — Ambulatory Visit (INDEPENDENT_AMBULATORY_CARE_PROVIDER_SITE_OTHER): Payer: 59 | Admitting: Family Medicine

## 2022-01-28 VITALS — BP 114/70 | HR 72 | Temp 97.5°F | Resp 18 | Ht 66.0 in | Wt 153.0 lb

## 2022-01-28 DIAGNOSIS — T402X5A Adverse effect of other opioids, initial encounter: Secondary | ICD-10-CM

## 2022-01-28 DIAGNOSIS — K5904 Chronic idiopathic constipation: Secondary | ICD-10-CM | POA: Diagnosis not present

## 2022-01-28 DIAGNOSIS — Z8679 Personal history of other diseases of the circulatory system: Secondary | ICD-10-CM | POA: Diagnosis not present

## 2022-01-28 DIAGNOSIS — R601 Generalized edema: Secondary | ICD-10-CM

## 2022-01-28 DIAGNOSIS — E875 Hyperkalemia: Secondary | ICD-10-CM

## 2022-01-28 DIAGNOSIS — N3000 Acute cystitis without hematuria: Secondary | ICD-10-CM

## 2022-01-28 DIAGNOSIS — D500 Iron deficiency anemia secondary to blood loss (chronic): Secondary | ICD-10-CM

## 2022-01-28 DIAGNOSIS — K5903 Drug induced constipation: Secondary | ICD-10-CM

## 2022-01-28 LAB — POCT URINALYSIS DIPSTICK
Bilirubin, UA: NEGATIVE
Blood, UA: NEGATIVE
Glucose, UA: NEGATIVE
Ketones, UA: NEGATIVE
Nitrite, UA: NEGATIVE
Protein, UA: NEGATIVE
Spec Grav, UA: 1.01 (ref 1.010–1.025)
Urobilinogen, UA: 1 E.U./dL
pH, UA: 7 (ref 5.0–8.0)

## 2022-01-28 MED ORDER — LUBIPROSTONE 24 MCG PO CAPS: 24.0000 ug | ORAL_CAPSULE | Freq: Two times a day (BID) | ORAL | 0 refills | Status: DC

## 2022-01-28 MED ORDER — CYCLOBENZAPRINE HCL 10 MG PO TABS: 10.0000 mg | ORAL_TABLET | Freq: Three times a day (TID) | ORAL | 0 refills | Status: DC | PRN

## 2022-01-28 NOTE — Progress Notes (Unsigned)
Subjective:  Patient ID: Molly Parrish, female    DOB: 1966/04/08  Age: 56 y.o. MRN: [id number]  Chief Complaint  Patient presents with   Hospitalization Follow-up    HPI Patient was seen at ED at [facility] on 01/25/2022. She presented at the emergency room  with generalized malaise and weakness x 1 week. She has gained approx. 12 lbs of fluid in the abdomen, over the past 3 days. She is currently being treated for UTI. Family's patient  appears to be a bit confused/goofy.  Patient c/o abnormal body movements as well as paresthesias in the left lower arm and both feet. She denied fever, chest pain, SOB, vomiting or diarrhea. Patient is having severe muscle cramps. They diagnosed her with Abdominal distention, paroysmal  a-fib, paresthesias, malaise, hyperkalemia with normal acid-base balance.  CT abdomen and pelvis wo contrast showed: Prominent retention of stool within the tortuous colon. Small bilateral renal calculi. Patient was taking lactulose 60 ml three times a day without BM. On xifaxin. Failed to have bm with enema. Has failed on otc stool softeners and miralax.  CT Head wo contrast:  Stable Head CT. No acute finding.  Current Outpatient Medications on File Prior to Visit  Medication Sig Dispense Refill   calcitRIOL (ROCALTROL) 0.5 MCG capsule Take 0.5 mcg by mouth 2 (two) times daily.     Cyanocobalamin (VITAMIN B 12 PO) Take 1,000 mcg by mouth daily.     famotidine (PEPCID) 40 MG tablet Take 40 mg by mouth at bedtime.     furosemide (LASIX) 40 MG tablet TAKE 2 TABLETS BY MOUTH TWICE DAILY 360 tablet 1   hydrOXYzine (VISTARIL) 25 MG capsule TAKE 1 CAPSULE(25 MG) BY MOUTH THREE TIMES DAILY 90 capsule 2   Iron-Vitamins (GERITOL COMPLETE) TABS Take 1 tablet by mouth daily.     lamoTRIgine (LAMICTAL) 200 MG tablet Take 200 mg by mouth at bedtime.     levothyroxine (SYNTHROID) 150 MCG tablet Take 1 tablet (150 mcg total) by mouth daily. 90 tablet 0   magnesium oxide  (MAG-OX) 400 MG tablet Take 400 mg by mouth daily.     nitrofurantoin, macrocrystal-monohydrate, (MACROBID) 100 MG capsule Take 1 capsule (100 mg total) by mouth 2 (two) times daily. 14 capsule 0   nystatin (MYCOSTATIN) 100000 UNIT/ML suspension SHAKE LIQUID AND TAKE 5 ML BY MOUTH FOUR TIMES DAILY FOR 7 DAYS 473 mL 1   omeprazole (PRILOSEC) 40 MG capsule Take 1 capsule by mouth at bedtime.     ondansetron (ZOFRAN) 4 MG tablet TAKE 1 TABLET(4 MG) BY MOUTH EVERY 8 HOURS AS NEEDED FOR NAUSEA OR VOMITING 60 tablet 0   promethazine (PHENERGAN) 25 MG tablet TAKE 1 TABLET BY MOUTH FOUR TIMES DAILY AS NEEDED FOR NAUSEA OR VOMITING 40 tablet 2   propranolol (INDERAL) 10 MG tablet Take 10 mg by mouth at bedtime.     QUEtiapine (SEROQUEL) 50 MG tablet Take 100 mg by mouth at bedtime.     ramelteon (ROZEREM) 8 MG tablet Take 8 mg by mouth at bedtime.     rifaximin (XIFAXAN) 550 MG TABS tablet Take 550 mg by mouth 2 (two) times daily.     rOPINIRole (REQUIP) 0.5 MG tablet Take by mouth.     sertraline (ZOLOFT) 100 MG tablet Take 100 mg by mouth in the morning and at bedtime.     sevelamer carbonate (RENVELA) 800 MG tablet Take 800 mg by mouth 3 (three) times daily with meals.  spironolactone (ALDACTONE) 50 MG tablet Take 100 mg by mouth 2 (two) times daily.     thiamine 100 MG tablet Take 100 mg by mouth daily.     traMADol (ULTRAM) 50 MG tablet TAKE 2 TABLETS BY MOUTH TWICE DAILY FOR BACK PAIN 120 tablet 2   triamcinolone (KENALOG) 0.1 % paste Use as directed 1 application in the mouth or throat 2 (two) times daily. 5 g 12   Vitamin D, Ergocalciferol, (DRISDOL) 1.25 MG (50000 UNIT) CAPS capsule TAKE 1 CAPSULE BY MOUTH 2 TIMES A WEEK 24 capsule 0   zolpidem (AMBIEN) 10 MG tablet TAKE 1 TABLET(10 MG) BY MOUTH AT BEDTIME AS NEEDED FOR SLEEP. STOP DAYVIGO 30 tablet 2   No current facility-administered medications on file prior to visit.   Past Medical History:  Diagnosis Date   Alcohol dependence in  remission ([place]) 9/76/7341   Alcoholic cirrhosis of liver (HCC)    Anemia    Anemia of chronic disease 08/08/2016   Arterial hypotension 05/08/2020   Bipolar disorder current episode depressed ([place])    Brain TIA 02/19/2020   CKD (chronic kidney disease)    Episodic mood disorder (HCC) 11/07/2013   Generalized anxiety disorder    Generalized weakness 07/11/2020   GERD (gastroesophageal reflux disease)    H/O bariatric surgery 06/11/2020   Hypocalcemia 12/10/2015   Hypokalemia    Hyponatremia 02/19/2020   Intestinal malabsorption    Iron deficiency anemia due to chronic blood loss 05/08/2020   Korsakoff syndrome ([place])    Lumbar disc disease 10/19/2019   Microscopic hematuria 04/16/2021   Migraine 04/20/2014   Mild recurrent major depression ([place]) 04/19/2020   Multiple closed fractures of ribs of left side 06/03/2020   Formatting of this note might be different from the original. Added automatically from request for surgery [id number]   Neuropathy 01/31/2020   Obsessive-compulsive disorder 11/07/2013   Other osteoporosis without current pathological fracture    Peripheral edema 11/01/2020   Polypharmacy 06/12/2020   Portal hypertension ([place])    Post-surgical hypothyroidism 12/10/2015   Primary insomnia 03/26/2021   Severe protein-calorie malnutrition ([place]) 03/26/2021   Shortness of breath 11/01/2020   Stage 3b chronic kidney disease ([place]) 04/16/2021   Stroke ([place])     left basal ganglia stroke (hemorrhagic)   Telogen effluvium 03/26/2021   Ulcer of left foot with fat layer exposed ([place]) 10/04/2020   Ulcer of right foot, with fat layer exposed ([place]) 10/10/2020   Vitamin D deficiency    Past Surgical History:  Procedure Laterality Date   BREAST EXCISIONAL BIOPSY Left    CATARACT EXTRACTION     GASTRIC BYPASS  2005   HEMORRHOID SURGERY     HERNIA REPAIR     metatarsus abductus surgery Left 1990   PARTIAL HYSTERECTOMY  05/1997   BL Ovaries remain. Done for fibroids.   THYROIDECTOMY  03/2013     Family History  Problem Relation Age of Onset   Breast cancer Mother    Osteoporosis Mother    Cancer Mother        Breast, lung, skin.    Breast cancer Maternal Grandmother    Social History   Socioeconomic History   Marital status: Married    Spouse name: Not on file   Number of children: 1   Years of education: Not on file   Highest education level: Not on file  Occupational History   Occupation: Engineer, maintenance (IT)    Comment: Retired.  Tobacco Use   Smoking status: Never  Smokeless tobacco: Never  Substance and Sexual Activity   Alcohol use: Not Currently    Comment: history of alcoholism. sober since 2014.   Drug use: Never   Sexual activity: Yes  Other Topics Concern   Not on file  Social History Narrative   Right handed   Social Determinants of Health   Financial Resource Strain: Low Risk  (05/20/2021)   Overall Financial Resource Strain (CARDIA)    Difficulty of Paying Living Expenses: Not hard at all  Food Insecurity: No Food Insecurity (05/20/2021)   Hunger Vital Sign    Worried About Running Out of Food in the Last Year: Never true    Ran Out of Food in the Last Year: Never true  Transportation Needs: No Transportation Needs (05/20/2021)   PRAPARE - Hydrologist (Medical): No    Lack of Transportation (Non-Medical): No  Physical Activity: Not on file  Stress: Not on file  Social Connections: Not on file    Review of Systems  Constitutional:  Positive for fatigue. Negative for chills and fever.  HENT:  Negative for congestion, rhinorrhea and sore throat.   Respiratory:  Positive for shortness of breath. Negative for cough.   Cardiovascular:  Positive for palpitations. Negative for chest pain.  Gastrointestinal:  Positive for abdominal distention, nausea and vomiting. Negative for abdominal pain, constipation and diarrhea.  Genitourinary:  Positive for dysuria. Negative for urgency.  Musculoskeletal:  Negative for back pain and myalgias  (unable to sleep because of cramps).  Neurological:  Positive for dizziness, weakness and headaches. Negative for light-headedness.  Psychiatric/Behavioral:  Negative for dysphoric mood. The patient is not nervous/anxious.      Objective:  BP 114/70   Pulse 72   Temp (!) 97.5 F (36.4 C)   Resp 18   Ht '5\' 6"'$  (1.676 m)   Wt 153 lb (69.4 kg)   BMI 24.69 kg/m      01/28/2022    4:14 PM 01/21/2022   11:45 AM 01/02/2022   11:12 AM  BP/Weight  Systolic BP 341 962 229  Diastolic BP 70 50 64  Wt. (Lbs) 153 149 147  BMI 24.69 kg/m2 24.05 kg/m2 24.46 kg/m2    Physical Exam  Diabetic Foot Exam - Simple   No data filed      Lab Results  Component Value Date   WBC 13.4 (H) 01/28/2022   HGB 8.5 (L) 01/28/2022   HCT 26.1 (L) 01/28/2022   PLT 361 01/28/2022   GLUCOSE 81 01/28/2022   CHOL 141 10/22/2021   TRIG 51 10/22/2021   HDL 71 10/22/2021   LDLCALC 59 10/22/2021   ALT 20 01/28/2022   AST 26 01/28/2022   NA 131 (L) 01/28/2022   K 4.4 01/28/2022   CL 91 (L) 01/28/2022   CREATININE 1.29 (H) 01/28/2022   BUN 26 (H) 01/28/2022   CO2 26 01/28/2022   TSH 0.046 (L) 01/02/2022      Assessment & Plan:   Problem List Items Addressed This Visit       Digestive   Chronic idiopathic constipation    Started Amitiza 24 mcg twice a day. Continue miralax 17 gm dose twice a day.  I gave linzess samples as it was all we had. The patient has failed on this previously.  Failed on OTC stool softeners, linzess, miralax, lactulose, and enemas/suppositories.  Patient has chronic idiopathic constipation, but is also on tramadol, so may be element of opioid induced constipation.  Relevant Orders   Ambulatory referral to Gastroenterology   Therapeutic opioid induced constipation    Recommend amitiza 24 mcg one twice daily. See assessment and plan under [facility].      Relevant Orders   Ambulatory referral to Gastroenterology     Genitourinary   Acute cystitis without hematuria -  Primary    Ordered UA and urine culture.      Relevant Orders   POCT urinalysis dipstick (Completed)   Urine Culture (Completed)     Other   Iron deficiency anemia due to chronic blood loss    Check labs.      Relevant Orders   CBC with Differential/Platelet (Completed)   Generalized edema    ON diuretics already. Elevate legs.       Hyperkalemia    Check labs.      Relevant Orders   Comprehensive metabolic panel (Completed)   History of cardiac arrhythmia    Ordered EKG.      Relevant Orders   EKG 12-Lead (Completed)  .  Meds ordered this encounter  Medications   lubiprostone (AMITIZA) 24 MCG capsule    Sig: Take 1 capsule (24 mcg total) by mouth 2 (two) times daily with a meal.    Dispense:  60 capsule    Refill:  0   cyclobenzaprine (FLEXERIL) 10 MG tablet    Sig: Take 1 tablet (10 mg total) by mouth 3 (three) times daily as needed for muscle spasms.    Dispense:  90 tablet    Refill:  0    Orders Placed This Encounter  Procedures   Urine Culture   Comprehensive metabolic panel   CBC with Differential/Platelet   Ambulatory referral to Gastroenterology   POCT urinalysis dipstick   EKG 12-Lead    I,Tynisa Vohs,acting as a scribe for Rochel Brome, MD.,have documented all relevant documentation on the behalf of Rochel Brome, MD,as directed by  Rochel Brome, MD while in the presence of Rochel Brome, MD.   Follow-up: Return in about 4 weeks (around 02/25/2022).  An After Visit Summary was printed and given to the patient.  Rochel Brome, MD [facility] [phone]

## 2022-01-28 NOTE — Patient Instructions (Signed)
Start on amitiza 24 mcg one oral twice daily  Continue miralax 17 gm dose twice a day.  We will call [facility] to get you an appointment with gastroenterologist (not [name], PAC.)

## 2022-01-29 ENCOUNTER — Other Ambulatory Visit: Payer: Self-pay | Admitting: Family Medicine

## 2022-01-29 ENCOUNTER — Ambulatory Visit (INDEPENDENT_AMBULATORY_CARE_PROVIDER_SITE_OTHER): Payer: 59

## 2022-01-29 DIAGNOSIS — K766 Portal hypertension: Secondary | ICD-10-CM

## 2022-01-29 DIAGNOSIS — D638 Anemia in other chronic diseases classified elsewhere: Secondary | ICD-10-CM

## 2022-01-29 DIAGNOSIS — D500 Iron deficiency anemia secondary to blood loss (chronic): Secondary | ICD-10-CM

## 2022-01-29 LAB — COMPREHENSIVE METABOLIC PANEL
ALT: 20 IU/L (ref 0–32)
AST: 26 IU/L (ref 0–40)
Albumin/Globulin Ratio: 1.7 (ref 1.2–2.2)
Albumin: 4 g/dL (ref 3.8–4.9)
Alkaline Phosphatase: 213 IU/L — ABNORMAL HIGH (ref 44–121)
BUN/Creatinine Ratio: 20 (ref 9–23)
BUN: 26 mg/dL — ABNORMAL HIGH (ref 6–24)
Bilirubin Total: 1.2 mg/dL (ref 0.0–1.2)
CO2: 26 mmol/L (ref 20–29)
Calcium: 7.7 mg/dL — ABNORMAL LOW (ref 8.7–10.2)
Chloride: 91 mmol/L — ABNORMAL LOW (ref 96–106)
Creatinine, Ser: 1.29 mg/dL — ABNORMAL HIGH (ref 0.57–1.00)
Globulin, Total: 2.4 g/dL (ref 1.5–4.5)
Glucose: 81 mg/dL (ref 70–99)
Potassium: 4.4 mmol/L (ref 3.5–5.2)
Sodium: 131 mmol/L — ABNORMAL LOW (ref 134–144)
Total Protein: 6.4 g/dL (ref 6.0–8.5)
eGFR: 48 mL/min/{1.73_m2} — ABNORMAL LOW (ref >59)

## 2022-01-29 LAB — CBC WITH DIFFERENTIAL/PLATELET
Basophils Absolute: 0.1 10*3/uL (ref 0.0–0.2)
Basos: 1 %
EOS (ABSOLUTE): 0.3 10*3/uL (ref 0.0–0.4)
Eos: 2 %
Hematocrit: 26.1 % — ABNORMAL LOW (ref 34.0–46.6)
Hemoglobin: 8.5 g/dL — ABNORMAL LOW (ref 11.1–15.9)
Immature Grans (Abs): 0.1 10*3/uL (ref 0.0–0.1)
Immature Granulocytes: 1 %
Lymphocytes Absolute: 1.7 10*3/uL (ref 0.7–3.1)
Lymphs: 12 %
MCH: 30.2 pg (ref 26.6–33.0)
MCHC: 32.6 g/dL (ref 31.5–35.7)
MCV: 93 fL (ref 79–97)
Monocytes Absolute: 1.2 10*3/uL — ABNORMAL HIGH (ref 0.1–0.9)
Monocytes: 9 %
Neutrophils Absolute: 10.2 10*3/uL — ABNORMAL HIGH (ref 1.4–7.0)
Neutrophils: 75 %
Platelets: 361 10*3/uL (ref 150–450)
RBC: 2.81 x10E6/uL — ABNORMAL LOW (ref 3.77–5.28)
RDW: 17.3 % — ABNORMAL HIGH (ref 11.7–15.4)
WBC: 13.4 10*3/uL — ABNORMAL HIGH (ref 3.4–10.8)

## 2022-01-29 LAB — URINE CULTURE

## 2022-01-29 NOTE — Patient Instructions (Signed)
Visit Information  Thank you for taking time to visit with me today. Please don't hesitate to contact me if I can be of assistance to you before our next scheduled telephone appointment.  Following are the goals we discussed today:  Follow up with MD's as planned. Call MD for changes in condition    If you are experiencing a Mental Health or [place] or need someone to talk to, please call the Suicide and Crisis Lifeline: 988 call the [place] National Suicide Prevention Lifeline: 351-876-0854 or TTY: 307-782-0851 TTY 9544862763) to talk to a trained counselor call 1-800-273-TALK (toll free, 24 hour hotline) call 911   Patient verbalizes understanding of instructions and care plan provided today and agrees to view in [place]. Active MyChart status and patient understanding of how to access instructions and care plan via MyChart confirmed with patient.     Tomasa Rand RN, BSN, CEN RN Case Freight forwarder - [employer] exhibitions officer Mobile: [phone]

## 2022-01-29 NOTE — Chronic Care Management (AMB) (Signed)
Chronic Care Management   CCM RN Visit Note  01/29/2022 Name: Molly Parrish MRN: [id number] DOB: 1965-11-09  Subjective: Molly Parrish is a 56 y.o. year old female who is a primary care patient of Cox, Kirsten, MD. The care management team was consulted for assistance with disease management and care coordination needs.    Engaged with patient by telephone for follow up visit in response to provider referral for case management and/or care coordination services.   Consent to Services:  The patient was given information about Chronic Care Management services, agreed to services, and gave verbal consent prior to initiation of services.  Please see initial visit note for detailed documentation.   Patient agreed to services and verbal consent obtained.   Assessment: Review of patient past medical history, allergies, medications, health status, including review of consultants reports, laboratory and other test data, was performed as part of comprehensive evaluation and provision of chronic care management services.   SDOH (Social Determinants of Health) assessments and interventions performed:    CCM Care Plan  Allergies  Allergen Reactions   Cholestyramine     Rash and itching   Trintellix [Vortioxetine]     Outpatient Encounter Medications as of 01/29/2022  Medication Sig   ALPRAZolam (XANAX) 0.25 MG tablet TAKE 1 TABLET BY MOUTH TWICE DAILY   Cyanocobalamin (VITAMIN B 12 PO) Take 1,000 mcg by mouth daily.   cyclobenzaprine (FLEXERIL) 10 MG tablet Take 1 tablet (10 mg total) by mouth 3 (three) times daily as needed for muscle spasms.   famotidine (PEPCID) 40 MG tablet Take 40 mg by mouth at bedtime.   furosemide (LASIX) 40 MG tablet TAKE 2 TABLETS BY MOUTH TWICE DAILY   hydrOXYzine (VISTARIL) 25 MG capsule TAKE 1 CAPSULE(25 MG) BY MOUTH THREE TIMES DAILY   Iron-Vitamins (GERITOL COMPLETE) TABS Take 1 tablet by mouth daily.   lamoTRIgine (LAMICTAL) 200 MG tablet Take 200 mg  by mouth at bedtime.   levothyroxine (SYNTHROID) 150 MCG tablet Take 1 tablet (150 mcg total) by mouth daily.   lubiprostone (AMITIZA) 24 MCG capsule Take 1 capsule (24 mcg total) by mouth 2 (two) times daily with a meal.   magnesium oxide (MAG-OX) 400 MG tablet Take 400 mg by mouth daily.   nitrofurantoin, macrocrystal-monohydrate, (MACROBID) 100 MG capsule Take 1 capsule (100 mg total) by mouth 2 (two) times daily.   nystatin (MYCOSTATIN) 100000 UNIT/ML suspension SHAKE LIQUID AND TAKE 5 ML BY MOUTH FOUR TIMES DAILY FOR 7 DAYS   omeprazole (PRILOSEC) 40 MG capsule Take 1 capsule by mouth at bedtime.   ondansetron (ZOFRAN) 4 MG tablet TAKE 1 TABLET(4 MG) BY MOUTH EVERY 8 HOURS AS NEEDED FOR NAUSEA OR VOMITING   promethazine (PHENERGAN) 25 MG tablet TAKE 1 TABLET BY MOUTH FOUR TIMES DAILY AS NEEDED FOR NAUSEA OR VOMITING   propranolol (INDERAL) 10 MG tablet Take 10 mg by mouth at bedtime.   QUEtiapine (SEROQUEL) 50 MG tablet Take 100 mg by mouth at bedtime.   rifaximin (XIFAXAN) 550 MG TABS tablet Take 550 mg by mouth 2 (two) times daily.   sertraline (ZOLOFT) 100 MG tablet Take 100 mg by mouth in the morning and at bedtime.   sevelamer carbonate (RENVELA) 800 MG tablet Take 800 mg by mouth 3 (three) times daily with meals.   spironolactone (ALDACTONE) 50 MG tablet Take 100 mg by mouth 2 (two) times daily.   thiamine 100 MG tablet Take 100 mg by mouth daily.   traMADol (ULTRAM) 50  MG tablet TAKE 2 TABLETS BY MOUTH TWICE DAILY FOR BACK PAIN   triamcinolone (KENALOG) 0.1 % paste Use as directed 1 application in the mouth or throat 2 (two) times daily.   Vitamin D, Ergocalciferol, (DRISDOL) 1.25 MG (50000 UNIT) CAPS capsule TAKE 1 CAPSULE BY MOUTH 2 TIMES A WEEK   zolpidem (AMBIEN) 10 MG tablet TAKE 1 TABLET(10 MG) BY MOUTH AT BEDTIME AS NEEDED FOR SLEEP. STOP DAYVIGO   No facility-administered encounter medications on file as of 01/29/2022.    Patient Active Problem List   Diagnosis Date Noted    Generalized edema 01/12/2022   Muscle cramps 01/11/2022   Weight gain 01/11/2022   History of sepsis 12/13/2021   Urinary urgency 11/24/2021   Cavus deformity of right foot 35/45/6256   Alcoholic peripheral neuropathy ([place]) 07/07/2021   Metatarsus adductus of right foot 07/07/2021   Encounter for screening mammogram for malignant neoplasm of breast 07/06/2021   Stage 3b chronic kidney disease ([place]) 04/16/2021   Primary insomnia 03/26/2021   Telogen effluvium 03/26/2021   Severe protein-calorie malnutrition ([place]) 03/26/2021   Vitamin D deficiency    GERD (gastroesophageal reflux disease)    CKD (chronic kidney disease)    Bipolar disorder current episode depressed ([place])    Anemia    Peripheral edema 11/01/2020   Shortness of breath 11/01/2020   Ulcer of right foot, with fat layer exposed ([place]) 10/10/2020   Ulcer of left foot with fat layer exposed ([place]) 10/04/2020   Multiple closed fractures of ribs of left side 06/03/2020   Iron deficiency anemia due to chronic blood loss 05/08/2020   Arterial hypotension 05/08/2020   Mild recurrent major depression ([place]) 04/19/2020   Generalized anxiety disorder 04/19/2020   Hyponatremia 02/19/2020   Neuropathy 01/31/2020   Acute cystitis without hematuria 11/01/2019   Lumbar disc disease 10/19/2019   Korsakoff syndrome ([place])    Other osteoporosis without current pathological fracture    Portal hypertension ([place])    Alcoholic cirrhosis of liver ([facility])    Anemia of chronic disease 08/08/2016   Hypocalcemia 12/10/2015   Post-surgical hypothyroidism 12/10/2015   Migraine 04/20/2014   Stroke ([place]) 04/20/2014   Obsessive-compulsive disorder 11/07/2013   Alcohol dependence in remission ([place]) 10/04/2013    Conditions to be addressed/monitored: CKD, Cirrhosis, foot infecton, anemia  Care Plan : RN Care Manager plan of care  Updates made by Thana Ates, RN since 01/29/2022 12:00 AM     Problem: No plan of care established for management  of chronic disease states ( cirrhosis, CKD stage 3, falls, foot ulcer and infection, anemia)   Priority: High     Long-Range Goal: Development of plan of care for chronic disease management. (cirrhosis, CKD stage 3, falls, foot ulcer and infection, anemia)   Start Date: 07/24/2021  Expected End Date: 07/24/2022  Priority: High  Note:   Current Barriers:  Chronic Disease Management support and education needs related to CKD Stage 3 and cirrhosis, anemia, chronic wound on foot, falls No Advanced Directives in place 07/24/2021  Patient reports she is having worsening kidney function. Reports change of medication ( see medication list) Reports she has had 2 falls. Reports that she now has blistered areas and warmth to her foot beside of scabbed area, Reports pending reconstruction surgery on 08/01/2021 by Dr. Prince Rome. Has a planned ultrasound of kidneys scheduled for 07/25/2021.  Reports when she was at nephrology office her BP was 79/52. Reports that her blood pressure continues to run low. Self monitors  daily.  08/26/2021  Foot-  Patient reports that she had her surgery. Reports surgery will happen in stages. Reports she had 63 stitches.  Reports she is using a scooter for ambulation. Reports she has had 2 falls since using the scooter.  Reports that scooter hit uneven pavement and she has fallen off.  Advanced directives- reports that she has received packet in the mail but has not completed them at this time.  Blood pressure- patient reports that she continues to have low blood pressure. Reports range of 90-100/60-80.  Denies any other new issues or concerns. 11/25/21: I am standing in for Tomasa Rand, RN, and I had a follow-up call with Molly Parrish. She mentioned feeling weak and tired after being discharged from the hospital, but her HGB level is 9.0, which is good news. Molly Parrish no longer uses the manual scooter and instead has a boot on her foot. She mentioned using a cane but still experiences some  balance issues. I suggested she use her rollator, which can provide more stability and allow her to take a seat if she feels dizzy or tired. She agreed and seemed to understand. Molly Parrish remains positive and motivated by her grandchildren. She also mentioned experiencing itching issues all over her body, which she has tried to alleviate with various creams. I advised her to contact her PCP, who knows her condition and can offer helpful information. Lastly, her next call with Tomasa Rand, RN, is scheduled for June 8th, 2023, at 2 pm. 01/29/2022  Placed call to patient for follow up. Patient reports to me that she is doing well. Reports she was having numbness to her limbs 5 days ago and went to the ED. States she had a high potassium level. Patient followed up with MD yesterday and potassium level was normal.  Pending urine culture.  Cirrhosis:  Patient reports that her liver is stable. Falls: denies any recent falls.  Chronic wound: Patient reports that her would is healed, states she has a callus  that she is keeping protected. Reports she is getting a custom brace and shoes made.  Advanced directives: Patient reports she has not completed her advanced directives as of yet. Denies any new concerns today.  RNCM Clinical Goal(s):  Patient will verbalize basic understanding of CKD Stage 3 and cirrhosis, anemia, chronic wound on foot, falls and disease process and self health management plan as evidenced by patient report. demonstrate improved adherence to prescribed treatment plan for CKD Stage 3, anemia chronic wound on foot  and cirrhosis as evidenced by patient report. collaborate with the care management team towards completion of advanced directives ASAP as evidenced by patient report  through collaboration with RN Care manager, provider, and care team.   Interventions: 1:1 collaboration with primary care provider regarding development and update of comprehensive plan of care as evidenced by  provider attestation and co-signature Inter-disciplinary care team collaboration (see longitudinal plan of care) Evaluation of current treatment plan related to  self management and patient's adherence to plan as established by provider   Chronic Kidney Disease (Status:  Reports she is managing and following up with MD )  Long Term Goal  Last practice recorded BP readings:  BP Readings from Last 3 Encounters:  01/21/22 (!) 106/50  01/02/22 100/64  12/05/21 104/64  Most recent eGFR/CrCl:  Lab Results  Component Value Date   EGFR 48 (L) 01/28/2022    No components found for: "CRCL"  Evaluation of current treatment plan related to chronic  kidney disease self management and patient's adherence to plan as established by provider      Reviewed medications with patient and discussed importance of compliance    Encouraged patient to continue to take her BP daily and call MD for high or low readings.    Falls:  (Status: Goal Met.) Long Term Goal  Reviewed medications and discussed potential side effects of medications such as dizziness and frequent urination Advised patient of importance of notifying provider of falls Assessed for signs and symptoms of orthostatic hypotension Assessed for falls since last encounter  Foot deformity and infection  (Status: Goal Met.) Long Term Goal  Evaluation of current treatment plan related to  foot deformity and planned surgery , self-management and patient's adherence to plan as established by provider. Reviewed medications with patient and discussed importance of taking all medications as prescribed; Reviewed scheduled/upcoming provider appointments including PCP, oncology and this case manager; Encouraged patient to call surgeon for any concerns with her foot. Reviewed fall prevention. Infection is cleared and is pending custom braces and shoes.  Oncology:  (Goal Met.) Long Term Goal  Assessment of understanding of anemia diagnosis:  Reviewed  upcoming provider appointments and treatment appointments  Cirrhosis:  (Goal Met YES. Long Term Assessed for changes in condition Reviewed medications Encouraged timely follow up for labs and PCP appointments  Patient Goals/Self-Care Activities: Take medications as prescribed   Attend all scheduled provider appointments Call pharmacy for medication refills 3-7 days in advance of running out of medications check blood pressure daily call doctor for signs and symptoms of high blood pressure or low blood pressure keep all doctor appointments Continue to be careful changing positions Be careful not to fall.     Plan:No further follow up required: with case manager Tomasa Rand RN, BSN, CEN RN Case Manager - [facility] [employer] exhibitions officer Mobile: [phone]

## 2022-01-30 ENCOUNTER — Ambulatory Visit: Payer: Medicare Other | Admitting: Oncology

## 2022-01-30 ENCOUNTER — Other Ambulatory Visit: Payer: Medicare Other

## 2022-01-30 DIAGNOSIS — E875 Hyperkalemia: Secondary | ICD-10-CM | POA: Insufficient documentation

## 2022-01-30 DIAGNOSIS — K5904 Chronic idiopathic constipation: Secondary | ICD-10-CM | POA: Insufficient documentation

## 2022-01-30 DIAGNOSIS — Z8679 Personal history of other diseases of the circulatory system: Secondary | ICD-10-CM | POA: Insufficient documentation

## 2022-01-30 NOTE — Assessment & Plan Note (Signed)
Check labs 

## 2022-01-30 NOTE — Assessment & Plan Note (Signed)
Ordered UA and urine culture.

## 2022-01-30 NOTE — Assessment & Plan Note (Signed)
Ordered EKG

## 2022-01-30 NOTE — Assessment & Plan Note (Signed)
Started Amitiza 24 mcg twice a day.

## 2022-02-04 ENCOUNTER — Encounter: Payer: Self-pay | Admitting: Family Medicine

## 2022-02-04 DIAGNOSIS — T402X5A Adverse effect of other opioids, initial encounter: Secondary | ICD-10-CM | POA: Insufficient documentation

## 2022-02-04 NOTE — Assessment & Plan Note (Signed)
Recommend amitiza 24 mcg one twice daily. See assessment and plan under CIC.

## 2022-02-04 NOTE — Assessment & Plan Note (Signed)
ON diuretics already. Elevate legs.

## 2022-02-06 ENCOUNTER — Telehealth: Payer: Self-pay

## 2022-02-06 NOTE — Telephone Encounter (Signed)
Patient made aware, verbalized Understanding 

## 2022-02-06 NOTE — Telephone Encounter (Signed)
Patient calling as she was given linzess 72 mcg samples. She has taken this for 8 days along with miralax. Has only been able to go two days. She is requesting an alternative or increase as she waits on amitiza approval.   Callback: [phone] Royce Macadamia, [place] 02/06/22 10:44 AM

## 2022-02-09 ENCOUNTER — Other Ambulatory Visit: Payer: Self-pay | Admitting: Family Medicine

## 2022-02-16 DIAGNOSIS — N1832 Chronic kidney disease, stage 3b: Secondary | ICD-10-CM

## 2022-02-16 DIAGNOSIS — D500 Iron deficiency anemia secondary to blood loss (chronic): Secondary | ICD-10-CM

## 2022-02-16 DIAGNOSIS — D638 Anemia in other chronic diseases classified elsewhere: Secondary | ICD-10-CM | POA: Diagnosis not present

## 2022-02-17 ENCOUNTER — Other Ambulatory Visit: Payer: Self-pay | Admitting: Physician Assistant

## 2022-02-23 ENCOUNTER — Other Ambulatory Visit: Payer: Self-pay | Admitting: Family Medicine

## 2022-02-23 DIAGNOSIS — R110 Nausea: Secondary | ICD-10-CM

## 2022-02-25 NOTE — Progress Notes (Signed)
[facility]  [address] [place],  [place]  [address] [phone]  Clinic Day:  02/25/2022  Referring physician: Rochel Brome, MD   HISTORY OF PRESENT ILLNESS:  The patient is a 56 y.o. female with a history of both leukocytosis and anemia.  She comes in today to reassess her peripheral counts.  Just last week, the patient was hospitalized for urosepsis.  Furthermore, her hemoglobin was low at 6.6.  This led to her receiving both 2 units of blood and IV iron.  A repeat GI evaluation was not done due to her having both a EGD and colonoscopy within the past year.  Overall, the patient claims to feel better today.  She still feels moderately weak; however, she continues to deny having any overt forms of blood loss.   PHYSICAL EXAM:  There were no vitals taken for this visit. Wt Readings from Last 3 Encounters:  01/28/22 153 lb (69.4 kg)  01/21/22 149 lb (67.6 kg)  01/02/22 147 lb (66.7 kg)   There is no height or weight on file to calculate BMI. Performance status (ECOG): 2 - Symptomatic, <50% confined to bed Physical Exam Constitutional:      Appearance: Normal appearance. She is ill-appearing (chronically ill appearance, but  she looks better vs previous visits).  HENT:     Mouth/Throat:     Mouth: Mucous membranes are moist.     Pharynx: Oropharynx is clear. No oropharyngeal exudate or posterior oropharyngeal erythema.  Cardiovascular:     Rate and Rhythm: Normal rate and regular rhythm.     Heart sounds: No murmur heard.    No friction rub. No gallop.  Pulmonary:     Effort: Pulmonary effort is normal. No respiratory distress.     Breath sounds: Normal breath sounds. No wheezing, rhonchi or rales.  Abdominal:     General: Bowel sounds are normal. There is no distension.     Palpations: Abdomen is soft. There is no mass.     Tenderness: There is no abdominal tenderness.  Musculoskeletal:        General: No swelling.     Right lower leg: No  edema.     Left lower leg: No edema.  Lymphadenopathy:     Cervical: No cervical adenopathy.     Upper Body:     Right upper body: No supraclavicular or axillary adenopathy.     Left upper body: No supraclavicular or axillary adenopathy.     Lower Body: No right inguinal adenopathy. No left inguinal adenopathy.  Skin:    General: Skin is warm.     Coloration: Skin is not jaundiced.     Findings: No lesion or rash.  Neurological:     General: No focal deficit present.     Mental Status: She is alert and oriented to person, place, and time. Mental status is at baseline.  Psychiatric:        Mood and Affect: Mood normal.        Behavior: Behavior normal.        Thought Content: Thought content normal.    LABS:       Latest Ref Rng & Units 01/28/2022    1:42 PM 01/02/2022   11:59 AM 11/26/2021   12:00 AM  CBC  WBC 3.4 - 10.8 x10E3/uL 13.4  16.2  11.9      Hemoglobin 11.1 - 15.9 g/dL 8.5  10.7  8.8      Hematocrit 34.0 - 46.6 %  26.1  32.5  29      Platelets 150 - 450 x10E3/uL 361  306  414         This result is from an external source.     Latest Reference Range & Units 11/26/21 16:24  Iron 28 - 170 ug/dL 26 (L)  UIBC ug/dL 236  TIBC 250 - 450 ug/dL 262  Saturation Ratios 10.4 - 31.8 % 10 (L)  Ferritin 11 - 307 ng/mL 710 (H)  (L): Data is abnormally low (H): Data is abnormally high  ASSESSMENT & PLAN:  Assessment/Plan:  A 56 y.o. female with leukocytosis and anemia.  I am pleased as her white count has already significantly improved since being discharged from the hospital.  This likely reflects her previous infection has essentially been cleared.  As mentioned previously, she was severely anemic upon her recent hospital admission.  Her iron panel is much more consistent with anemia of chronic disease, much more so than iron deficiency anemia.  Despite her anemia, she appears to clinically be doing well.  I will see her back in 3 months for repeat clinical assessment.  The  patient understands all the plans discussed today and is in agreement with them.     Nader Boys Macarthur Critchley, MD

## 2022-02-26 ENCOUNTER — Inpatient Hospital Stay: Payer: 59 | Attending: Oncology

## 2022-02-26 ENCOUNTER — Inpatient Hospital Stay (HOSPITAL_BASED_OUTPATIENT_CLINIC_OR_DEPARTMENT_OTHER): Payer: 59 | Admitting: Oncology

## 2022-02-26 ENCOUNTER — Other Ambulatory Visit: Payer: Self-pay | Admitting: Oncology

## 2022-02-26 VITALS — BP 121/68 | HR 89 | Temp 98.0°F | Resp 16 | Ht 66.0 in | Wt 153.0 lb

## 2022-02-26 DIAGNOSIS — Z79899 Other long term (current) drug therapy: Secondary | ICD-10-CM | POA: Insufficient documentation

## 2022-02-26 DIAGNOSIS — D72829 Elevated white blood cell count, unspecified: Secondary | ICD-10-CM | POA: Diagnosis not present

## 2022-02-26 DIAGNOSIS — D649 Anemia, unspecified: Secondary | ICD-10-CM | POA: Diagnosis present

## 2022-02-26 DIAGNOSIS — D500 Iron deficiency anemia secondary to blood loss (chronic): Secondary | ICD-10-CM | POA: Diagnosis not present

## 2022-02-26 DIAGNOSIS — D508 Other iron deficiency anemias: Secondary | ICD-10-CM

## 2022-02-26 DIAGNOSIS — D638 Anemia in other chronic diseases classified elsewhere: Secondary | ICD-10-CM

## 2022-02-26 LAB — CBC: RBC: 3.62 — AB (ref 3.87–5.11)

## 2022-02-26 LAB — BASIC METABOLIC PANEL
BUN: 39 — AB (ref 4–21)
CO2: 25 — AB (ref 13–22)
Chloride: 98 — AB (ref 99–108)
Creatinine: 1.4 — AB (ref 0.5–1.1)
Glucose: 79
Potassium: 4.1 mEq/L (ref 3.5–5.1)
Sodium: 133 — AB (ref 137–147)

## 2022-02-26 LAB — HEPATIC FUNCTION PANEL
ALT: 24 U/L (ref 7–35)
AST: 31 (ref 13–35)
Alkaline Phosphatase: 217 — AB (ref 25–125)
Bilirubin, Total: 0.6

## 2022-02-26 LAB — CBC AND DIFFERENTIAL
HCT: 32 — AB (ref 36–46)
Hemoglobin: 10.3 — AB (ref 12.0–16.0)
Neutrophils Absolute: 6.72
Platelets: 416 10*3/uL — AB (ref 150–400)
WBC: 9.2

## 2022-02-26 LAB — FERRITIN: Ferritin: 442 ng/mL — ABNORMAL HIGH (ref 11–307)

## 2022-02-26 LAB — COMPREHENSIVE METABOLIC PANEL
Albumin: 4.1 (ref 3.5–5.0)
Calcium: 8.5 — AB (ref 8.7–10.7)

## 2022-02-26 LAB — IRON AND TIBC
Iron: 40 ug/dL (ref 28–170)
Saturation Ratios: 12 % (ref 10.4–31.8)
TIBC: 338 ug/dL (ref 250–450)
UIBC: 298 ug/dL

## 2022-02-27 ENCOUNTER — Encounter: Payer: Self-pay | Admitting: Oncology

## 2022-02-27 ENCOUNTER — Telehealth: Payer: Self-pay

## 2022-02-27 NOTE — Telephone Encounter (Signed)
Patient needs a refill in furosemide. She was taking 2 tablets twice a day. Then her dose was changing to 2 tablets TID. Can I send a new prescription?

## 2022-03-01 ENCOUNTER — Encounter: Payer: Self-pay | Admitting: Family Medicine

## 2022-03-01 ENCOUNTER — Other Ambulatory Visit: Payer: Self-pay | Admitting: Family Medicine

## 2022-03-01 MED ORDER — FUROSEMIDE 40 MG PO TABS: 80.0000 mg | ORAL_TABLET | Freq: Three times a day (TID) | ORAL | 0 refills | Status: DC

## 2022-03-02 ENCOUNTER — Other Ambulatory Visit: Payer: Self-pay | Admitting: Family Medicine

## 2022-03-02 NOTE — Telephone Encounter (Signed)
Mychart message sent to patient.   Molly Parrish, [name] 03/02/22 8:22 AM

## 2022-03-03 ENCOUNTER — Ambulatory Visit: Payer: Medicare Other | Admitting: Family Medicine

## 2022-03-04 ENCOUNTER — Ambulatory Visit: Payer: Medicare Other | Admitting: Family Medicine

## 2022-03-04 ENCOUNTER — Other Ambulatory Visit: Payer: Self-pay | Admitting: Family Medicine

## 2022-03-05 NOTE — Progress Notes (Signed)
Subjective:  Patient ID: Molly Parrish, female    DOB: Sep 11, 1965  Age: 56 y.o. MRN: [id number]  Chief Complaint  Patient presents with   Constipation    4 week follow up   HPI: Constipation:  Patient presents for follow up on constipation. Patient was started on Amitiza 24 mcg take 1 capsule twice daily 4 weeks ago. Patient states medication is working great for here. Takes miralax once daily and benefiber daily.   Patient c/o of pressure when she urinates. No dysuria. Going on since yesterday.   Weight is unchanged. Has some swelling in her hands and feet by the night.    Anemia: sees Dr. Lavera Guise. Hb improved and iron studies have improved.   Patient sees Dr. Prince Rome in [facility]. She is getting hard mold for shoes to prevent pressure on large callused.  Had foot surgery January 2023.   Current Outpatient Medications on File Prior to Visit  Medication Sig Dispense Refill   calcitRIOL (ROCALTROL) 0.5 MCG capsule Take 0.5 mcg by mouth 2 (two) times daily.     Cyanocobalamin (VITAMIN B 12 PO) Take 1,000 mcg by mouth daily.     cyclobenzaprine (FLEXERIL) 10 MG tablet Take 1 tablet (10 mg total) by mouth 3 (three) times daily as needed for muscle spasms. 90 tablet 0   famotidine (PEPCID) 40 MG tablet Take 40 mg by mouth at bedtime.     furosemide (LASIX) 40 MG tablet Take 2 tablets (80 mg total) by mouth 3 (three) times daily between meals. 540 tablet 0   hydrOXYzine (VISTARIL) 25 MG capsule TAKE 1 CAPSULE(25 MG) BY MOUTH THREE TIMES DAILY 90 capsule 2   Iron-Vitamins (GERITOL COMPLETE) TABS Take 1 tablet by mouth daily.     lamoTRIgine (LAMICTAL) 200 MG tablet Take 200 mg by mouth at bedtime.     levothyroxine (SYNTHROID) 150 MCG tablet Take 1 tablet (150 mcg total) by mouth daily. 90 tablet 0   lubiprostone (AMITIZA) 24 MCG capsule TAKE 1 CAPSULE(24 MCG) BY MOUTH TWICE DAILY WITH A MEAL 60 capsule 5   magnesium oxide (MAG-OX) 400 MG tablet Take 400 mg by mouth daily.      nystatin (MYCOSTATIN) 100000 UNIT/ML suspension SHAKE LIQUID AND TAKE 5 ML BY MOUTH FOUR TIMES DAILY FOR 7 DAYS 473 mL 1   ondansetron (ZOFRAN) 4 MG tablet TAKE 1 TABLET(4 MG) BY MOUTH EVERY 8 HOURS AS NEEDED FOR NAUSEA OR VOMITING 60 tablet 0   promethazine (PHENERGAN) 25 MG tablet TAKE 1 TABLET BY MOUTH FOUR TIMES DAILY AS NEEDED FOR NAUSEA OR VOMITING 40 tablet 2   propranolol (INDERAL) 10 MG tablet Take 10 mg by mouth at bedtime.     QUEtiapine (SEROQUEL) 50 MG tablet Take 100 mg by mouth at bedtime.     ramelteon (ROZEREM) 8 MG tablet Take 8 mg by mouth at bedtime.     rifaximin (XIFAXAN) 550 MG TABS tablet Take 550 mg by mouth 2 (two) times daily.     rOPINIRole (REQUIP) 0.5 MG tablet Take by mouth.     sertraline (ZOLOFT) 100 MG tablet Take 100 mg by mouth in the morning and at bedtime.     sevelamer carbonate (RENVELA) 800 MG tablet Take 800 mg by mouth 3 (three) times daily with meals.     spironolactone (ALDACTONE) 50 MG tablet Take 100 mg by mouth 2 (two) times daily.     thiamine 100 MG tablet Take 100 mg by mouth daily.  traMADol (ULTRAM) 50 MG tablet TAKE 2 TABLETS BY MOUTH TWICE DAILY FOR BACK PAIN 120 tablet 2   triamcinolone (KENALOG) 0.1 % paste APPLY TO MOUTH OR THROAT TWICE DAILY 5 g 3   Vitamin D, Ergocalciferol, (DRISDOL) 1.25 MG (50000 UNIT) CAPS capsule TAKE 1 CAPSULE BY MOUTH 2 TIMES A WEEK 24 capsule 0   zolpidem (AMBIEN) 10 MG tablet TAKE 1 TABLET(10 MG) BY MOUTH AT BEDTIME AS NEEDED FOR SLEEP. STOP DAYVIGO 30 tablet 2   No current facility-administered medications on file prior to visit.   Past Medical History:  Diagnosis Date   Alcohol dependence in remission ([place]) 0/62/6948   Alcoholic cirrhosis of liver (HCC)    Anemia    Anemia of chronic disease 08/08/2016   Arterial hypotension 05/08/2020   Bipolar disorder current episode depressed ([place])    Brain TIA 02/19/2020   CKD (chronic kidney disease)    Episodic mood disorder (HCC) 11/07/2013   Generalized  anxiety disorder    Generalized weakness 07/11/2020   GERD (gastroesophageal reflux disease)    H/O bariatric surgery 06/11/2020   Hypocalcemia 12/10/2015   Hypokalemia    Hyponatremia 02/19/2020   Intestinal malabsorption    Iron deficiency anemia due to chronic blood loss 05/08/2020   Korsakoff syndrome ([place])    Lumbar disc disease 10/19/2019   Microscopic hematuria 04/16/2021   Migraine 04/20/2014   Mild recurrent major depression ([place]) 04/19/2020   Multiple closed fractures of ribs of left side 06/03/2020   Formatting of this note might be different from the original. Added automatically from request for surgery [id number]   Neuropathy 01/31/2020   Obsessive-compulsive disorder 11/07/2013   Other osteoporosis without current pathological fracture    Peripheral edema 11/01/2020   Polypharmacy 06/12/2020   Portal hypertension ([place])    Post-surgical hypothyroidism 12/10/2015   Primary insomnia 03/26/2021   Severe protein-calorie malnutrition ([place]) 03/26/2021   Shortness of breath 11/01/2020   Stage 3b chronic kidney disease ([place]) 04/16/2021   Stroke ([place])     left basal ganglia stroke (hemorrhagic)   Telogen effluvium 03/26/2021   Ulcer of left foot with fat layer exposed ([place]) 10/04/2020   Ulcer of right foot, with fat layer exposed ([place]) 10/10/2020   Vitamin D deficiency    Past Surgical History:  Procedure Laterality Date   BREAST EXCISIONAL BIOPSY Left    CATARACT EXTRACTION     GASTRIC BYPASS  2005   HEMORRHOID SURGERY     HERNIA REPAIR     metatarsus abductus surgery Left 1990   PARTIAL HYSTERECTOMY  05/1997   BL Ovaries remain. Done for fibroids.   THYROIDECTOMY  03/2013    Family History  Problem Relation Age of Onset   Breast cancer Mother    Osteoporosis Mother    Cancer Mother        Breast, lung, skin.    Breast cancer Maternal Grandmother    Social History   Socioeconomic History   Marital status: Married    Spouse name: Not on file   Number of children: 1   Years of  education: Not on file   Highest education level: Not on file  Occupational History   Occupation: Engineer, maintenance (IT)    Comment: Retired.  Tobacco Use   Smoking status: Never   Smokeless tobacco: Never  Substance and Sexual Activity   Alcohol use: Not Currently    Comment: history of alcoholism. sober since 2014.   Drug use: Never   Sexual activity: Yes  Other Topics  Concern   Not on file  Social History Narrative   Right handed   Social Determinants of Health   Financial Resource Strain: Low Risk  (05/20/2021)   Overall Financial Resource Strain (CARDIA)    Difficulty of Paying Living Expenses: Not hard at all  Food Insecurity: No Food Insecurity (05/20/2021)   Hunger Vital Sign    Worried About Running Out of Food in the Last Year: Never true    Ran Out of Food in the Last Year: Never true  Transportation Needs: No Transportation Needs (05/20/2021)   PRAPARE - Hydrologist (Medical): No    Lack of Transportation (Non-Medical): No  Physical Activity: Not on file  Stress: Not on file  Social Connections: Not on file    Review of Systems  Constitutional:  Negative for appetite change, fatigue and fever.  HENT:  Negative for congestion, ear pain, sinus pressure and sore throat.   Respiratory:  Positive for shortness of breath. Negative for cough, chest tightness and wheezing.   Cardiovascular:  Negative for chest pain and palpitations.  Gastrointestinal:  Negative for abdominal pain, constipation, diarrhea, nausea and vomiting.       Heart burn/indigestion. On famotidine 40 mg once daily and omeprazole 40 mg once daily Tried mylanta per recommendations from [name], PAC>   Genitourinary:  Negative for dysuria and hematuria.  Musculoskeletal:  Negative for arthralgias, back pain, joint swelling and myalgias.  Skin:  Negative for rash.  Neurological:  Negative for dizziness, weakness and headaches.  Psychiatric/Behavioral:  Negative for dysphoric mood. The  patient is not nervous/anxious.      Objective:  BP 98/60 (BP Location: Right Arm, Patient Position: Sitting, Cuff Size: Small)   Pulse 98   Temp (!) 97.2 F (36.2 C) (Temporal)   Wt 151 lb (68.5 kg)   SpO2 93%   BMI 24.37 kg/m      03/06/2022    8:50 AM 02/26/2022   10:04 AM 01/28/2022    4:14 PM  BP/Weight  Systolic BP 98 784 696  Diastolic BP 60 68 70  Wt. (Lbs) 151 153 153  BMI 24.37 kg/m2 24.69 kg/m2 24.69 kg/m2    Physical Exam Vitals reviewed.  Constitutional:      Appearance: Normal appearance. She is normal weight.  Neck:     Vascular: No carotid bruit.  Cardiovascular:     Rate and Rhythm: Normal rate and regular rhythm.     Heart sounds: Murmur heard.  Pulmonary:     Effort: Pulmonary effort is normal.     Breath sounds: Normal breath sounds.     Comments: Patient did 6 minutes walk and her oxygen at rest was 93%.  Walking went down to 90% and then after walking went up to 92% Abdominal:     General: Abdomen is flat. Bowel sounds are normal.     Palpations: Abdomen is soft.     Tenderness: There is no abdominal tenderness.  Neurological:     Mental Status: She is alert and oriented to person, place, and time.  Psychiatric:        Mood and Affect: Mood normal.        Behavior: Behavior normal.    Diabetic Foot Exam - Simple   No data filed      Lab Results  Component Value Date   WBC 9.2 02/26/2022   HGB 10.3 (A) 02/26/2022   HCT 32 (A) 02/26/2022   PLT 416 (A) 02/26/2022  GLUCOSE 81 01/28/2022   CHOL 141 10/22/2021   TRIG 51 10/22/2021   HDL 71 10/22/2021   LDLCALC 59 10/22/2021   ALT 24 02/26/2022   AST 31 02/26/2022   NA 133 (A) 02/26/2022   K 4.1 02/26/2022   CL 98 (A) 02/26/2022   CREATININE 1.4 (A) 02/26/2022   BUN 39 (A) 02/26/2022   CO2 25 (A) 02/26/2022   TSH 1.040 03/06/2022      Assessment & Plan:   Problem List Items Addressed This Visit       Digestive   GERD (gastroesophageal reflux disease)    Change  omeprazole to pantoprazole 40 mg daily. Continue famotidine 40 mg daily.       Relevant Medications   pantoprazole (PROTONIX) 40 MG tablet   Chronic idiopathic constipation - Primary     Continue amitiza 24 mcg twice daily and miralax and benefiber.         Endocrine   Post-surgical hypothyroidism    Previously well controlled Continue Synthroid at current dose  Recheck TSH and adjust Synthroid as indicated       Relevant Orders   TSH (Completed)     Musculoskeletal and Integument   Localized osteoporosis without current pathological fracture    Ordered Bone density.      Relevant Orders   DG Bone Density     Other   Vitamin D deficiency    Check labs.      Relevant Orders   VITAMIN D 25 Hydroxy (Vit-D Deficiency, Fractures) (Completed)   Urinary urgency    Ordered UA and urine culture. OTC AZO.      Relevant Orders   Urine Culture (Completed)   POCT URINALYSIS DIP (CLINITEK) (Completed)   Dyspnea on exertion    Ordered PFT and it was normal. Ordered  6 minutes walk and her oxygen at rest was 93%. Walking went down to 90% and then after walking went up to 92%.      Relevant Orders   Pulmonary function test   6 minute walk   ECHOCARDIOGRAM COMPLETE   Pro b natriuretic peptide (BNP)   Heart murmur    Ordered Echocardiogram.      Relevant Orders   ECHOCARDIOGRAM COMPLETE  .  Meds ordered this encounter  Medications   pantoprazole (PROTONIX) 40 MG tablet    Sig: Take 1 tablet (40 mg total) by mouth daily.    Dispense:  90 tablet    Refill:  0    Orders Placed This Encounter  Procedures   Urine Culture   DG Bone Density   TSH   VITAMIN D 25 Hydroxy (Vit-D Deficiency, Fractures)   Pro b natriuretic peptide (BNP)   POCT URINALYSIS DIP (CLINITEK)   ECHOCARDIOGRAM COMPLETE   Pulmonary function test   6 minute walk    [employer], acting as a Education administrator for Rochel Brome, MD.,have documented all relevant documentation on the behalf of Rochel Brome, MD,as directed by  Rochel Brome, MD while in the presence of Rochel Brome, MD.   Follow-up: Return in about 3 months (around 06/06/2022) for chronic fasting.  An After Visit Summary was printed and given to the patient.  Rochel Brome, MD [facility] [phone]

## 2022-03-06 ENCOUNTER — Other Ambulatory Visit: Payer: Self-pay | Admitting: Family Medicine

## 2022-03-06 ENCOUNTER — Encounter: Payer: Self-pay | Admitting: Family Medicine

## 2022-03-06 ENCOUNTER — Ambulatory Visit (INDEPENDENT_AMBULATORY_CARE_PROVIDER_SITE_OTHER): Payer: 59 | Admitting: Family Medicine

## 2022-03-06 VITALS — BP 98/60 | HR 98 | Temp 97.2°F | Wt 151.0 lb

## 2022-03-06 DIAGNOSIS — E890 Postprocedural hypothyroidism: Secondary | ICD-10-CM | POA: Diagnosis not present

## 2022-03-06 DIAGNOSIS — R3915 Urgency of urination: Secondary | ICD-10-CM | POA: Diagnosis not present

## 2022-03-06 DIAGNOSIS — K5904 Chronic idiopathic constipation: Secondary | ICD-10-CM

## 2022-03-06 DIAGNOSIS — M816 Localized osteoporosis [Lequesne]: Secondary | ICD-10-CM

## 2022-03-06 DIAGNOSIS — R0609 Other forms of dyspnea: Secondary | ICD-10-CM

## 2022-03-06 DIAGNOSIS — K219 Gastro-esophageal reflux disease without esophagitis: Secondary | ICD-10-CM

## 2022-03-06 DIAGNOSIS — E559 Vitamin D deficiency, unspecified: Secondary | ICD-10-CM | POA: Diagnosis not present

## 2022-03-06 DIAGNOSIS — R011 Cardiac murmur, unspecified: Secondary | ICD-10-CM

## 2022-03-06 LAB — POCT URINALYSIS DIP (CLINITEK)
Bilirubin, UA: NEGATIVE
Blood, UA: NEGATIVE
Glucose, UA: NEGATIVE mg/dL
Ketones, POC UA: NEGATIVE mg/dL
Nitrite, UA: NEGATIVE
POC PROTEIN,UA: NEGATIVE
Spec Grav, UA: 1.015 (ref 1.010–1.025)
Urobilinogen, UA: 0.2 E.U./dL
pH, UA: 5 (ref 5.0–8.0)

## 2022-03-06 MED ORDER — PANTOPRAZOLE SODIUM 40 MG PO TBEC: 40.0000 mg | DELAYED_RELEASE_TABLET | Freq: Every day | ORAL | 0 refills | Status: DC

## 2022-03-06 NOTE — Assessment & Plan Note (Addendum)
Ordered UA and urine culture. OTC AZO.

## 2022-03-06 NOTE — Assessment & Plan Note (Signed)
Check labs 

## 2022-03-06 NOTE — Assessment & Plan Note (Signed)
Ordered Bone density.

## 2022-03-06 NOTE — Assessment & Plan Note (Signed)
Ordered Echocardiogram.

## 2022-03-06 NOTE — Assessment & Plan Note (Addendum)
Continue amitiza 24 mcg twice daily and miralax and benefiber.

## 2022-03-06 NOTE — Patient Instructions (Addendum)
Change omeprazole to pantoprazole 40 mg daily. Continue famotidine 40 mg daily.   May use OTC AZO for bladder pressure.

## 2022-03-06 NOTE — Assessment & Plan Note (Signed)
Previously well controlled Continue Synthroid at current dose  Recheck TSH and adjust Synthroid as indicated   

## 2022-03-06 NOTE — Assessment & Plan Note (Addendum)
Ordered PFT and it was normal. Ordered  6 minutes walk and her oxygen at rest was 93%. Walking went down to 90% and then after walking went up to 92%.

## 2022-03-07 LAB — URINE CULTURE

## 2022-03-07 LAB — TSH: TSH: 1.04 u[IU]/mL (ref 0.450–4.500)

## 2022-03-07 LAB — VITAMIN D 25 HYDROXY (VIT D DEFICIENCY, FRACTURES): Vit D, 25-Hydroxy: 31.1 ng/mL (ref 30.0–100.0)

## 2022-03-09 LAB — PULMONARY FUNCTION TEST

## 2022-03-11 ENCOUNTER — Telehealth: Payer: Self-pay | Admitting: Family Medicine

## 2022-03-11 NOTE — Telephone Encounter (Signed)
   Lajean Saver has been scheduled for the following appointment:  WHAT: ECHO WHERE: [place] DATE: 03/13/22 TIME: 9:30 AM CHECK-IN  Patient has been made aware.    Lajean Saver has been scheduled for the following appointment:  WHAT: BONE DENSITY WHERE: [place] DATE: 03/26/22 TIME: 1:00 PM CHECK-IN  Patient has been made aware.

## 2022-03-13 DIAGNOSIS — R011 Cardiac murmur, unspecified: Secondary | ICD-10-CM | POA: Diagnosis not present

## 2022-03-14 NOTE — Assessment & Plan Note (Signed)
Change omeprazole to pantoprazole 40 mg daily. Continue famotidine 40 mg daily.

## 2022-03-24 ENCOUNTER — Other Ambulatory Visit: Payer: Self-pay | Admitting: Family Medicine

## 2022-03-31 ENCOUNTER — Other Ambulatory Visit: Payer: Self-pay | Admitting: Family Medicine

## 2022-04-02 ENCOUNTER — Other Ambulatory Visit: Payer: Self-pay | Admitting: Family Medicine

## 2022-04-08 ENCOUNTER — Other Ambulatory Visit: Payer: Self-pay | Admitting: Family Medicine

## 2022-04-22 ENCOUNTER — Other Ambulatory Visit: Payer: Self-pay | Admitting: Physician Assistant

## 2022-04-22 ENCOUNTER — Other Ambulatory Visit: Payer: Self-pay | Admitting: Family Medicine

## 2022-04-22 DIAGNOSIS — R110 Nausea: Secondary | ICD-10-CM

## 2022-04-28 ENCOUNTER — Other Ambulatory Visit: Payer: Self-pay | Admitting: Family Medicine

## 2022-05-06 ENCOUNTER — Encounter: Payer: Self-pay | Admitting: Nurse Practitioner

## 2022-05-06 ENCOUNTER — Ambulatory Visit (INDEPENDENT_AMBULATORY_CARE_PROVIDER_SITE_OTHER): Payer: 59 | Admitting: Nurse Practitioner

## 2022-05-06 ENCOUNTER — Other Ambulatory Visit: Payer: Self-pay | Admitting: Family Medicine

## 2022-05-06 VITALS — BP 132/68 | HR 105 | Temp 97.3°F | Ht 66.0 in | Wt 153.0 lb

## 2022-05-06 DIAGNOSIS — R251 Tremor, unspecified: Secondary | ICD-10-CM | POA: Diagnosis not present

## 2022-05-06 DIAGNOSIS — Z79899 Other long term (current) drug therapy: Secondary | ICD-10-CM | POA: Diagnosis not present

## 2022-05-06 NOTE — Progress Notes (Signed)
Acute Office Visit  Subjective:    Patient ID: Molly Parrish, female    DOB: Jan 27, 1966, 56 y.o.   MRN: [id number]  Chief Complaint  Patient presents with   Jittery    HPI: Patient is in today states she has been jittery, had hot and cold spells and tingling in her BL hands along with numbness, sometimes has tremors/"shakes". Symptoms started four days ago. Believes she could have some thyroid issues, reports she had these same symptoms a few months ago and her potassium was elevated. She is followed by  Endocrinologist, unable to get an appt until late November. Pt is prescribed multiple medications, including   Past Medical History:  Diagnosis Date   Alcohol dependence in remission ([place]) 4/70/9295   Alcoholic cirrhosis of liver (HCC)    Anemia    Anemia of chronic disease 08/08/2016   Arterial hypotension 05/08/2020   Bipolar disorder current episode depressed ([place])    Brain TIA 02/19/2020   CKD (chronic kidney disease)    Episodic mood disorder (HCC) 11/07/2013   Generalized anxiety disorder    Generalized weakness 07/11/2020   GERD (gastroesophageal reflux disease)    H/O bariatric surgery 06/11/2020   Hypocalcemia 12/10/2015   Hypokalemia    Hyponatremia 02/19/2020   Intestinal malabsorption    Iron deficiency anemia due to chronic blood loss 05/08/2020   Korsakoff syndrome ([place])    Lumbar disc disease 10/19/2019   Microscopic hematuria 04/16/2021   Migraine 04/20/2014   Mild recurrent major depression ([place]) 04/19/2020   Multiple closed fractures of ribs of left side 06/03/2020   Formatting of this note might be different from the original. Added automatically from request for surgery [id number]   Neuropathy 01/31/2020   Obsessive-compulsive disorder 11/07/2013   Other osteoporosis without current pathological fracture    Peripheral edema 11/01/2020   Polypharmacy 06/12/2020   Portal hypertension ([place])    Post-surgical hypothyroidism 12/10/2015   Primary insomnia 03/26/2021    Severe protein-calorie malnutrition ([place]) 03/26/2021   Shortness of breath 11/01/2020   Stage 3b chronic kidney disease ([place]) 04/16/2021   Stroke ([place])     left basal ganglia stroke (hemorrhagic)   Telogen effluvium 03/26/2021   Ulcer of left foot with fat layer exposed ([place]) 10/04/2020   Ulcer of right foot, with fat layer exposed ([place]) 10/10/2020   Vitamin D deficiency     Past Surgical History:  Procedure Laterality Date   BREAST EXCISIONAL BIOPSY Left    CATARACT EXTRACTION     GASTRIC BYPASS  2005   HEMORRHOID SURGERY     HERNIA REPAIR     metatarsus abductus surgery Left 1990   PARTIAL HYSTERECTOMY  05/1997   BL Ovaries remain. Done for fibroids.   THYROIDECTOMY  03/2013    Family History  Problem Relation Age of Onset   Breast cancer Mother    Osteoporosis Mother    Cancer Mother        Breast, lung, skin.    Breast cancer Maternal Grandmother     Social History   Socioeconomic History   Marital status: Married    Spouse name: Not on file   Number of children: 1   Years of education: Not on file   Highest education level: Not on file  Occupational History   Occupation: Engineer, maintenance (IT)    Comment: Retired.  Tobacco Use   Smoking status: Never   Smokeless tobacco: Never  Substance and Sexual Activity   Alcohol use: Not Currently  Comment: history of alcoholism. sober since 2014.   Drug use: Never   Sexual activity: Yes  Other Topics Concern   Not on file  Social History Narrative   Right handed   Social Determinants of Health   Financial Resource Strain: Low Risk  (05/20/2021)   Overall Financial Resource Strain (CARDIA)    Difficulty of Paying Living Expenses: Not hard at all  Food Insecurity: No Food Insecurity (05/20/2021)   Hunger Vital Sign    Worried About Running Out of Food in the Last Year: Never true    Ran Out of Food in the Last Year: Never true  Transportation Needs: No Transportation Needs (05/20/2021)   PRAPARE - Radiographer, therapeutic (Medical): No    Lack of Transportation (Non-Medical): No  Physical Activity: Not on file  Stress: Not on file  Social Connections: Not on file  Intimate Partner Violence: Not At Risk (05/20/2021)   Humiliation, Afraid, Rape, and Kick questionnaire    Fear of Current or Ex-Partner: No    Emotionally Abused: No    Physically Abused: No    Sexually Abused: No    Outpatient Medications Prior to Visit  Medication Sig Dispense Refill   ALPRAZolam (XANAX) 0.25 MG tablet TAKE 1 TABLET BY MOUTH TWICE DAILY 60 tablet 1   calcitRIOL (ROCALTROL) 0.5 MCG capsule Take 0.5 mcg by mouth 2 (two) times daily.     Cyanocobalamin (VITAMIN B 12 PO) Take 1,000 mcg by mouth daily.     cyclobenzaprine (FLEXERIL) 10 MG tablet TAKE 1 TABLET(10 MG) BY MOUTH THREE TIMES DAILY AS NEEDED FOR MUSCLE SPASMS 90 tablet 0   famotidine (PEPCID) 40 MG tablet Take 40 mg by mouth at bedtime.     furosemide (LASIX) 40 MG tablet Take 2 tablets (80 mg total) by mouth 3 (three) times daily between meals. 540 tablet 0   hydrOXYzine (VISTARIL) 25 MG capsule TAKE 1 CAPSULE(25 MG) BY MOUTH THREE TIMES DAILY 90 capsule 2   Iron-Vitamins (GERITOL COMPLETE) TABS Take 1 tablet by mouth daily.     lamoTRIgine (LAMICTAL) 200 MG tablet Take 200 mg by mouth at bedtime.     levothyroxine (SYNTHROID) 150 MCG tablet TAKE 1 TABLET(150 MCG) BY MOUTH DAILY 90 tablet 0   lubiprostone (AMITIZA) 24 MCG capsule TAKE 1 CAPSULE(24 MCG) BY MOUTH TWICE DAILY WITH A MEAL 60 capsule 5   magnesium oxide (MAG-OX) 400 MG tablet Take 400 mg by mouth daily.     nystatin (MYCOSTATIN) 100000 UNIT/ML suspension SHAKE LIQUID AND TAKE 5 ML BY MOUTH FOUR TIMES DAILY FOR 7 DAYS 473 mL 1   ondansetron (ZOFRAN) 4 MG tablet TAKE 1 TABLET(4 MG) BY MOUTH EVERY 8 HOURS AS NEEDED FOR NAUSEA OR VOMITING 60 tablet 0   pantoprazole (PROTONIX) 40 MG tablet Take 1 tablet (40 mg total) by mouth daily. 90 tablet 0   promethazine (PHENERGAN) 25 MG tablet TAKE 1 TABLET  BY MOUTH FOUR TIMES DAILY AS NEEDED FOR NAUSEA OR VOMITING 40 tablet 2   propranolol (INDERAL) 10 MG tablet Take 10 mg by mouth at bedtime.     QUEtiapine (SEROQUEL) 50 MG tablet Take 100 mg by mouth at bedtime.     ramelteon (ROZEREM) 8 MG tablet Take 8 mg by mouth at bedtime.     rifaximin (XIFAXAN) 550 MG TABS tablet Take 550 mg by mouth 2 (two) times daily.     rOPINIRole (REQUIP) 0.5 MG tablet Take by mouth.  sertraline (ZOLOFT) 100 MG tablet Take 100 mg by mouth in the morning and at bedtime.     sevelamer carbonate (RENVELA) 800 MG tablet Take 800 mg by mouth 3 (three) times daily with meals.     spironolactone (ALDACTONE) 50 MG tablet Take 100 mg by mouth 2 (two) times daily.     thiamine 100 MG tablet Take 100 mg by mouth daily.     traMADol (ULTRAM) 50 MG tablet TAKE 2 TABLETS BY MOUTH TWICE DAILY FOR BACK PAIN 120 tablet 0   triamcinolone (KENALOG) 0.1 % paste APPLY TO MOUTH OR THROAT TWICE DAILY 5 g 3   Vitamin D, Ergocalciferol, (DRISDOL) 1.25 MG (50000 UNIT) CAPS capsule TAKE 1 CAPSULE BY MOUTH 2 TIMES A WEEK 24 capsule 0   zolpidem (AMBIEN) 10 MG tablet TAKE 1 TABLET(10 MG) BY MOUTH AT BEDTIME AS NEEDED FOR SLEEP. STOP DAYVIGO 30 tablet 3   No facility-administered medications prior to visit.    Allergies  Allergen Reactions   Cholestyramine     Rash and itching   Trintellix [Vortioxetine]     Review of Systems See pertinent positives and negatives per HPI.     Objective:   BP 132/68   Pulse (!) 105   Temp (!) 97.3 F (36.3 C)   Ht 5' 6"  (1.676 m)   Wt 153 lb (69.4 kg)   SpO2 99%   BMI 24.69 kg/m   Physical Exam Vitals reviewed.  Constitutional:      Appearance: Normal appearance.  HENT:     Head: Normocephalic.     Right Ear: Tympanic membrane normal.     Left Ear: Tympanic membrane normal.     Nose: Nose normal.     Mouth/Throat:     Mouth: Mucous membranes are moist.  Eyes:     Pupils: Pupils are equal, round, and reactive to light.   Cardiovascular:     Rate and Rhythm: Regular rhythm. Tachycardia present.  Pulmonary:     Effort: Pulmonary effort is normal.     Breath sounds: Normal breath sounds.  Abdominal:     General: Bowel sounds are normal.     Palpations: Abdomen is soft.  Musculoskeletal:     Cervical back: Neck supple.  Skin:    General: Skin is warm and dry.     Capillary Refill: Capillary refill takes less than 2 seconds.  Neurological:     General: No focal deficit present.     Mental Status: She is alert and oriented to person, place, and time.     Motor: Tremor present.     Coordination: Coordination is intact.  Psychiatric:        Mood and Affect: Mood normal.        Behavior: Behavior normal.    BP 132/68   Pulse (!) 105   Temp (!) 97.3 F (36.3 C)   Ht 5' 6"  (1.676 m)   Wt 153 lb (69.4 kg)   SpO2 99%   BMI 24.69 kg/m    Health Maintenance Due  Topic Date Due   PAP SMEAR-Modifier  Never done   Zoster Vaccines- Shingrix (2 of 2) 10/21/2020   COVID-19 Vaccine (5 - Pfizer risk series) 08/29/2021   INFLUENZA VACCINE  02/17/2022       Lab Results  Component Value Date   TSH 1.040 03/06/2022   Lab Results  Component Value Date   WBC 9.2 02/26/2022   HGB 10.3 (A) 02/26/2022   HCT 32 (A) 02/26/2022  MCV 93 01/28/2022   PLT 416 (A) 02/26/2022   Lab Results  Component Value Date   NA 133 (A) 02/26/2022   K 4.1 02/26/2022   CO2 25 (A) 02/26/2022   GLUCOSE 81 01/28/2022   BUN 39 (A) 02/26/2022   CREATININE 1.4 (A) 02/26/2022   BILITOT 1.2 01/28/2022   ALKPHOS 217 (A) 02/26/2022   AST 31 02/26/2022   ALT 24 02/26/2022   PROT 6.4 01/28/2022   ALBUMIN 4.1 02/26/2022   CALCIUM 8.5 (A) 02/26/2022   EGFR 48 (L) 01/28/2022   Lab Results  Component Value Date   CHOL 141 10/22/2021   Lab Results  Component Value Date   HDL 71 10/22/2021   Lab Results  Component Value Date   LDLCALC 59 10/22/2021   Lab Results  Component Value Date   TRIG 51 10/22/2021   Lab  Results  Component Value Date   CHOLHDL 2.0 10/22/2021        Assessment & Plan:   1. Tremors of nervous system - T4, free - TSH - B12 and Folate Panel - CBC with Differential/Platelet - Comprehensive metabolic panel  2. Polypharmacy - T4, free - TSH - B12 and Folate Panel - CBC with Differential/Platelet - Comprehensive metabolic panel      We will call you with lab results Continue medications Follow-up with specialists as scheduled Follow-up with Dr Tobie Poet 06/15/22 at 10:20 am   Follow-up: 06/15/22 at 10:20 with Dr Tobie Poet  An After Visit Summary was printed and given to the patient.  I, Rip Harbour, NP, have reviewed all documentation for this visit. The documentation on 05/06/22 for the exam, diagnosis, procedures, and orders are all accurate and complete.   Signed, Rip Harbour, NP [place] [phone]

## 2022-05-06 NOTE — Patient Instructions (Addendum)
We will call you with lab results Continue medications Follow-up with specialists as scheduled Follow-up with Dr Tobie Poet 06/15/22 at 10:20 am    Tremor A tremor is trembling or shaking that you cannot control. Most tremors affect the hands or arms. Tremors can also affect the head, vocal cords, face, and other parts of the body. There are many types of tremors. Common types include: Essential tremor. These usually occur in people older than 40. This type of tremor may run in families and can happen in otherwise healthy people. Resting tremor. These occur when the muscles are at rest, such as when your hands are resting in your lap. People with Parkinson's disease often have resting tremors. Postural tremor. These occur when you try to hold a pose, such as keeping your hands outstretched. Kinetic tremor. These occur during purposeful movement, such as trying to touch a finger to your nose. Task-specific tremor. These may occur when you do certain tasks such as writing, speaking, or standing. Psychogenic tremor. These are greatly reduced or go away when you are distracted. These tremors happen due to underlying stress or psychiatric disease. They can happen in people of all ages. Some types of tremors have no known cause. Tremors can also be a symptom of nervous system problems (neurological disorders) that may occur with aging. Some tremors go away with treatment, while others do not. Follow these instructions at home: Lifestyle     If you drink alcohol: Limit how much you have to: 0-1 drink a day for women who are not pregnant. 0-2 drinks a day for men. Know how much alcohol is in a drink. In the [place] one drink equals one 12 oz bottle of beer (355 mL), one 5 oz glass of wine (148 mL), or one 1 oz glass of hard liquor (44 mL). Do not use any products that contain nicotine or tobacco. These products include cigarettes, chewing tobacco, and vaping devices, such as e-cigarettes. If you need help  quitting, ask your health care provider. Avoid extreme heat and extreme cold. Limit your caffeine intake, as told by your health care provider. Try to get 8 hours of sleep each night. Find ways to manage your stress, such as meditation or yoga. General instructions Take over-the-counter and prescription medicines only as told by your health care provider. Keep all follow-up visits. This is important. Contact a health care provider if: You develop a tremor after starting a new medicine. You have a tremor along with other symptoms such as: Numbness. Tingling. Pain. Weakness. Your tremor gets worse. Your tremor interferes with your day-to-day life. Summary A tremor is trembling or shaking that you cannot control. Most tremors affect the hands or arms. Some types of tremors have no known cause. Others may be a symptom of nervous system problems (neurological disorders). Make sure you discuss any tremors you have with your health care provider. This information is not intended to replace advice given to you by your health care provider. Make sure you discuss any questions you have with your health care provider. Document Revised: 04/25/2021 Document Reviewed: 04/25/2021 Elsevier Patient Education  [place].

## 2022-05-11 ENCOUNTER — Telehealth: Payer: Self-pay

## 2022-05-11 ENCOUNTER — Other Ambulatory Visit: Payer: Self-pay

## 2022-05-11 ENCOUNTER — Encounter: Payer: Self-pay | Admitting: Oncology

## 2022-05-11 ENCOUNTER — Other Ambulatory Visit: Payer: 59

## 2022-05-11 ENCOUNTER — Other Ambulatory Visit: Payer: Self-pay | Admitting: Nurse Practitioner

## 2022-05-11 DIAGNOSIS — E871 Hypo-osmolality and hyponatremia: Secondary | ICD-10-CM

## 2022-05-11 LAB — COMPREHENSIVE METABOLIC PANEL
ALT: 14 IU/L (ref 0–32)
AST: 17 IU/L (ref 0–40)
Albumin/Globulin Ratio: 1.7 (ref 1.2–2.2)
Albumin: 3.8 g/dL (ref 3.8–4.9)
Alkaline Phosphatase: 325 IU/L — ABNORMAL HIGH (ref 44–121)
BUN/Creatinine Ratio: 22 (ref 9–23)
BUN: 28 mg/dL — ABNORMAL HIGH (ref 6–24)
Bilirubin Total: 0.5 mg/dL (ref 0.0–1.2)
CO2: 19 mmol/L — ABNORMAL LOW (ref 20–29)
Calcium: 5.9 mg/dL — CL (ref 8.7–10.2)
Chloride: 90 mmol/L — ABNORMAL LOW (ref 96–106)
Creatinine, Ser: 1.25 mg/dL — ABNORMAL HIGH (ref 0.57–1.00)
Globulin, Total: 2.2 g/dL (ref 1.5–4.5)
Glucose: 78 mg/dL (ref 70–99)
Potassium: 3.7 mmol/L (ref 3.5–5.2)
Sodium: 129 mmol/L — ABNORMAL LOW (ref 134–144)
Total Protein: 6 g/dL (ref 6.0–8.5)
eGFR: 50 mL/min/{1.73_m2} — ABNORMAL LOW (ref >59)

## 2022-05-11 LAB — CBC WITH DIFFERENTIAL/PLATELET
Basophils Absolute: 0.1 10*3/uL (ref 0.0–0.2)
Basos: 1 %
EOS (ABSOLUTE): 0.2 10*3/uL (ref 0.0–0.4)
Eos: 2 %
Hematocrit: 31.2 % — ABNORMAL LOW (ref 34.0–46.6)
Hemoglobin: 9.7 g/dL — ABNORMAL LOW (ref 11.1–15.9)
Immature Grans (Abs): 0.1 10*3/uL (ref 0.0–0.1)
Immature Granulocytes: 1 %
Lymphocytes Absolute: 1.5 10*3/uL (ref 0.7–3.1)
Lymphs: 15 %
MCH: 27.5 pg (ref 26.6–33.0)
MCHC: 31.1 g/dL — ABNORMAL LOW (ref 31.5–35.7)
MCV: 88 fL (ref 79–97)
Monocytes Absolute: 0.7 10*3/uL (ref 0.1–0.9)
Monocytes: 7 %
Neutrophils Absolute: 7.6 10*3/uL — ABNORMAL HIGH (ref 1.4–7.0)
Neutrophils: 74 %
Platelets: 454 10*3/uL — ABNORMAL HIGH (ref 150–450)
RBC: 3.53 x10E6/uL — ABNORMAL LOW (ref 3.77–5.28)
RDW: 15.5 % — ABNORMAL HIGH (ref 11.7–15.4)
WBC: 10.1 10*3/uL (ref 3.4–10.8)

## 2022-05-11 LAB — T4, FREE: Free T4: 1.05 ng/dL (ref 0.82–1.77)

## 2022-05-11 LAB — TSH: TSH: 9.55 u[IU]/mL — ABNORMAL HIGH (ref 0.450–4.500)

## 2022-05-11 LAB — B12 AND FOLATE PANEL
Folate: >20 ng/mL (ref >3.0)
Vitamin B-12: 1746 pg/mL — ABNORMAL HIGH (ref 232–1245)

## 2022-05-11 MED ORDER — LEVOTHYROXINE SODIUM 175 MCG PO TABS: 175.0000 ug | ORAL_TABLET | Freq: Every day | ORAL | 1 refills | Status: DC

## 2022-05-11 NOTE — Telephone Encounter (Signed)
Per Larene Beach patient needs to come in today for STAT CMP. Patient informed, agreed to come in today.

## 2022-05-12 ENCOUNTER — Other Ambulatory Visit: Payer: Self-pay

## 2022-05-12 ENCOUNTER — Telehealth: Payer: Self-pay

## 2022-05-12 LAB — COMPREHENSIVE METABOLIC PANEL
ALT: 13 IU/L (ref 0–32)
AST: 20 IU/L (ref 0–40)
Albumin/Globulin Ratio: 1.6 (ref 1.2–2.2)
Albumin: 3.4 g/dL — ABNORMAL LOW (ref 3.8–4.9)
Alkaline Phosphatase: 326 IU/L — ABNORMAL HIGH (ref 44–121)
BUN/Creatinine Ratio: 22 (ref 9–23)
BUN: 25 mg/dL — ABNORMAL HIGH (ref 6–24)
Bilirubin Total: 0.5 mg/dL (ref 0.0–1.2)
CO2: 20 mmol/L (ref 20–29)
Calcium: 5.9 mg/dL — CL (ref 8.7–10.2)
Chloride: 94 mmol/L — ABNORMAL LOW (ref 96–106)
Creatinine, Ser: 1.13 mg/dL — ABNORMAL HIGH (ref 0.57–1.00)
Globulin, Total: 2.1 g/dL (ref 1.5–4.5)
Glucose: 101 mg/dL — ABNORMAL HIGH (ref 70–99)
Potassium: 4.2 mmol/L (ref 3.5–5.2)
Sodium: 132 mmol/L — ABNORMAL LOW (ref 134–144)
Total Protein: 5.5 g/dL — ABNORMAL LOW (ref 6.0–8.5)
eGFR: 56 mL/min/{1.73_m2} — ABNORMAL LOW (ref >59)

## 2022-05-12 LAB — PARATHYROID HORMONE, INTACT (NO CA): PTH: 47 pg/mL (ref 15–65)

## 2022-05-12 LAB — CALCIUM, IONIZED: Calcium, Ion: 3.4 mg/dL — ABNORMAL LOW (ref 4.5–5.6)

## 2022-05-12 LAB — VITAMIN D 25 HYDROXY (VIT D DEFICIENCY, FRACTURES): Vit D, 25-Hydroxy: 25.6 ng/mL — ABNORMAL LOW (ref 30.0–100.0)

## 2022-05-12 NOTE — Telephone Encounter (Signed)
Patient was notified of alert calcium reading of 5.9.  Dr, Cox advised that she go immediately to the ED.

## 2022-05-13 ENCOUNTER — Telehealth: Payer: Self-pay

## 2022-05-13 NOTE — Telephone Encounter (Signed)
Patient called stating when she left [facility] her K was 3.2 and Calcium was 6.2. She questioned how much she should take supplement wise to keep her within normal range.

## 2022-05-14 ENCOUNTER — Other Ambulatory Visit: Payer: Self-pay | Admitting: Family Medicine

## 2022-05-15 NOTE — Telephone Encounter (Signed)
Patient was called but she has not heard from either of the physicians.  She continues to have tremors.  Dr. Tobie Poet is going to reach out to Dr. Tamala Julian again.

## 2022-05-18 ENCOUNTER — Other Ambulatory Visit: Payer: Self-pay

## 2022-05-18 DIAGNOSIS — E890 Postprocedural hypothyroidism: Secondary | ICD-10-CM

## 2022-05-19 ENCOUNTER — Encounter: Payer: Self-pay | Admitting: Oncology

## 2022-05-19 ENCOUNTER — Other Ambulatory Visit: Payer: 59

## 2022-05-19 DIAGNOSIS — E871 Hypo-osmolality and hyponatremia: Secondary | ICD-10-CM

## 2022-05-20 ENCOUNTER — Other Ambulatory Visit: Payer: Self-pay | Admitting: Family Medicine

## 2022-05-20 LAB — COMPREHENSIVE METABOLIC PANEL
ALT: 17 IU/L (ref 0–32)
AST: 19 IU/L (ref 0–40)
Albumin/Globulin Ratio: 1.3 (ref 1.2–2.2)
Albumin: 3.1 g/dL — ABNORMAL LOW (ref 3.8–4.9)
Alkaline Phosphatase: 372 IU/L — ABNORMAL HIGH (ref 44–121)
BUN/Creatinine Ratio: 20 (ref 9–23)
BUN: 24 mg/dL (ref 6–24)
Bilirubin Total: 0.4 mg/dL (ref 0.0–1.2)
CO2: 25 mmol/L (ref 20–29)
Calcium: 5.5 mg/dL — CL (ref 8.7–10.2)
Chloride: 92 mmol/L — ABNORMAL LOW (ref 96–106)
Creatinine, Ser: 1.22 mg/dL — ABNORMAL HIGH (ref 0.57–1.00)
Globulin, Total: 2.4 g/dL (ref 1.5–4.5)
Glucose: 89 mg/dL (ref 70–99)
Potassium: 3.7 mmol/L (ref 3.5–5.2)
Sodium: 131 mmol/L — ABNORMAL LOW (ref 134–144)
Total Protein: 5.5 g/dL — ABNORMAL LOW (ref 6.0–8.5)
eGFR: 51 mL/min/{1.73_m2} — ABNORMAL LOW (ref >59)

## 2022-05-22 ENCOUNTER — Other Ambulatory Visit: Payer: 59

## 2022-05-22 ENCOUNTER — Other Ambulatory Visit: Payer: Self-pay | Admitting: Family Medicine

## 2022-05-22 ENCOUNTER — Ambulatory Visit
Admission: RE | Admit: 2022-05-22 | Discharge: 2022-05-22 | Disposition: A | Discharge status: Home / Self Care | Payer: 59 | Source: Ambulatory Visit | Attending: Family Medicine | Admitting: Family Medicine

## 2022-05-22 DIAGNOSIS — R928 Other abnormal and inconclusive findings on diagnostic imaging of breast: Secondary | ICD-10-CM

## 2022-05-22 DIAGNOSIS — E890 Postprocedural hypothyroidism: Secondary | ICD-10-CM

## 2022-05-22 LAB — COMPREHENSIVE METABOLIC PANEL
ALT: 13 IU/L (ref 0–32)
AST: 15 IU/L (ref 0–40)
Albumin/Globulin Ratio: 1.5 (ref 1.2–2.2)
Albumin: 3 g/dL — ABNORMAL LOW (ref 3.8–4.9)
Alkaline Phosphatase: 345 IU/L — ABNORMAL HIGH (ref 44–121)
BUN/Creatinine Ratio: 23 (ref 9–23)
BUN: 26 mg/dL — ABNORMAL HIGH (ref 6–24)
Bilirubin Total: 0.3 mg/dL (ref 0.0–1.2)
CO2: 25 mmol/L (ref 20–29)
Calcium: 6.6 mg/dL — CL (ref 8.7–10.2)
Chloride: 100 mmol/L (ref 96–106)
Creatinine, Ser: 1.15 mg/dL — ABNORMAL HIGH (ref 0.57–1.00)
Globulin, Total: 2 g/dL (ref 1.5–4.5)
Glucose: 67 mg/dL — ABNORMAL LOW (ref 70–99)
Potassium: 4.9 mmol/L (ref 3.5–5.2)
Sodium: 135 mmol/L (ref 134–144)
Total Protein: 5 g/dL — ABNORMAL LOW (ref 6.0–8.5)
eGFR: 55 mL/min/{1.73_m2} — ABNORMAL LOW (ref >59)

## 2022-05-22 LAB — PHOSPHORUS: Phosphorus: 4 mg/dL (ref 3.0–4.3)

## 2022-05-22 LAB — MAGNESIUM: Magnesium: 2.1 mg/dL (ref 1.6–2.3)

## 2022-05-23 LAB — CALCIUM, IONIZED: Calcium, Ion: 3.9 mg/dL — ABNORMAL LOW (ref 4.5–5.6)

## 2022-05-26 ENCOUNTER — Other Ambulatory Visit: Payer: Self-pay

## 2022-05-29 ENCOUNTER — Other Ambulatory Visit: Payer: Self-pay

## 2022-05-30 LAB — COMPREHENSIVE METABOLIC PANEL
ALT: 12 IU/L (ref 0–32)
AST: 18 IU/L (ref 0–40)
Albumin/Globulin Ratio: 1.1 — ABNORMAL LOW (ref 1.2–2.2)
Albumin: 3.1 g/dL — ABNORMAL LOW (ref 3.8–4.9)
Alkaline Phosphatase: 358 IU/L — ABNORMAL HIGH (ref 44–121)
BUN/Creatinine Ratio: 26 — ABNORMAL HIGH (ref 9–23)
BUN: 34 mg/dL — ABNORMAL HIGH (ref 6–24)
Bilirubin Total: 0.7 mg/dL (ref 0.0–1.2)
CO2: 21 mmol/L (ref 20–29)
Calcium: 7 mg/dL — ABNORMAL LOW (ref 8.7–10.2)
Chloride: 96 mmol/L (ref 96–106)
Creatinine, Ser: 1.33 mg/dL — ABNORMAL HIGH (ref 0.57–1.00)
Globulin, Total: 2.8 g/dL (ref 1.5–4.5)
Glucose: 148 mg/dL — ABNORMAL HIGH (ref 70–99)
Potassium: 4.5 mmol/L (ref 3.5–5.2)
Sodium: 134 mmol/L (ref 134–144)
Total Protein: 5.9 g/dL — ABNORMAL LOW (ref 6.0–8.5)
eGFR: 46 mL/min/{1.73_m2} — ABNORMAL LOW (ref >59)

## 2022-05-30 LAB — MAGNESIUM: Magnesium: 2.1 mg/dL (ref 1.6–2.3)

## 2022-06-02 ENCOUNTER — Other Ambulatory Visit: Payer: Self-pay

## 2022-06-02 MED ORDER — PANTOPRAZOLE SODIUM 40 MG PO TBEC: 40.0000 mg | DELAYED_RELEASE_TABLET | Freq: Every day | ORAL | 0 refills | Status: DC

## 2022-06-04 LAB — SPECIMEN STATUS REPORT

## 2022-06-04 LAB — MAGNESIUM: Magnesium: 2.4 mg/dL — ABNORMAL HIGH (ref 1.6–2.3)

## 2022-06-08 ENCOUNTER — Other Ambulatory Visit: Payer: Self-pay

## 2022-06-08 MED ORDER — HYDROXYZINE PAMOATE 25 MG PO CAPS: ORAL_CAPSULE | ORAL | 2 refills | Status: DC

## 2022-06-15 ENCOUNTER — Encounter: Payer: Self-pay | Admitting: Family Medicine

## 2022-06-15 ENCOUNTER — Ambulatory Visit (INDEPENDENT_AMBULATORY_CARE_PROVIDER_SITE_OTHER): Payer: 59 | Admitting: Family Medicine

## 2022-06-15 DIAGNOSIS — R601 Generalized edema: Secondary | ICD-10-CM

## 2022-06-15 DIAGNOSIS — Z23 Encounter for immunization: Secondary | ICD-10-CM | POA: Diagnosis not present

## 2022-06-15 DIAGNOSIS — E559 Vitamin D deficiency, unspecified: Secondary | ICD-10-CM | POA: Diagnosis not present

## 2022-06-15 DIAGNOSIS — K7030 Alcoholic cirrhosis of liver without ascites: Secondary | ICD-10-CM

## 2022-06-15 DIAGNOSIS — E162 Hypoglycemia, unspecified: Secondary | ICD-10-CM

## 2022-06-15 DIAGNOSIS — D638 Anemia in other chronic diseases classified elsewhere: Secondary | ICD-10-CM

## 2022-06-15 DIAGNOSIS — E890 Postprocedural hypothyroidism: Secondary | ICD-10-CM

## 2022-06-15 DIAGNOSIS — F411 Generalized anxiety disorder: Secondary | ICD-10-CM

## 2022-06-15 MED ORDER — GVOKE HYPOPEN 2-PACK 0.5 MG/0.1ML ~~LOC~~ SOAJ: SUBCUTANEOUS | 2 refills | Status: AC

## 2022-06-15 NOTE — Patient Instructions (Addendum)
Discuss changing sleep medicine and anxiety medicine with Dr. Erling Cruz.  Discuss possible change in diuretic (lasix to torsemide) with Dr. Audie Clear.

## 2022-06-15 NOTE — Progress Notes (Signed)
Subjective:  Patient ID: Molly Parrish, female    DOB: [birth year]  Age: 56 y.o. MRN: [id number]  Chief Complaint  Patient presents with   alcoholic cirrhosis    Chronic Kidney Disease    HPI Constipation:  Patient presents for follow up on constipation. Patient was started on Amitiza 24 mcg take 1 capsule twice daily. Patient states medication is working great for here. Takes miralax once daily and benefiber daily.   Hypocalcemia: checked BMP  Calcium carbonate 600 mg 6 oral twice daily. Paresthesias. Cramps are still happening.   Hypoglycemia: has a meter. Sugars 65-70. Uses glucose tablets. Had gastric bypass 2005. Vomits twice a day. Eating 6 small meals per day. A meal consists of 2 eggs for breakfast. Snacks: yogurt with protein. Protein and a salad at lunch. 2 veggies and a meat for supper.   Weight is unchanged. Has some swelling in her hands and feet by the night.    Anemia: sees Dr. Lavera Guise. Hb improved and iron studies have improved.      06/15/2022   10:02 AM 12/05/2021   10:55 AM 11/14/2021   11:16 AM  PHQ9 SCORE ONLY  PHQ-9 Total Score 7 0 0      06/15/2022   10:06 AM  GAD 7 : Generalized Anxiety Score  Nervous, Anxious, on Edge 3  Control/stop worrying 3  Worry too much - different things 3  Trouble relaxing 2  Restless 2  Easily annoyed or irritable 0  Afraid - awful might happen 1  Total GAD 7 Score 14  Anxiety Difficulty Very difficult     Current Outpatient Medications on File Prior to Visit  Medication Sig Dispense Refill   ALPRAZolam (XANAX) 0.25 MG tablet TAKE 1 TABLET BY MOUTH TWICE DAILY 60 tablet 1   calcitRIOL (ROCALTROL) 0.5 MCG capsule Take 0.5 mcg by mouth 2 (two) times daily.     calcium carbonate (OSCAL) 1500 (600 Ca) MG TABS tablet Take 3,600 mg by mouth 2 (two) times daily with a meal.     Cyanocobalamin (VITAMIN B 12 PO) Take 1,000 mcg by mouth daily.     cyclobenzaprine (FLEXERIL) 10 MG tablet TAKE 1 TABLET(10 MG) BY  MOUTH THREE TIMES DAILY AS NEEDED FOR MUSCLE SPASMS 90 tablet 0   famotidine (PEPCID) 40 MG tablet Take 40 mg by mouth at bedtime.     furosemide (LASIX) 40 MG tablet Take 2 tablets (80 mg total) by mouth 3 (three) times daily between meals. 540 tablet 0   hydrOXYzine (VISTARIL) 25 MG capsule TAKE 1 CAPSULE(25 MG) BY MOUTH THREE TIMES DAILY 90 capsule 2   Iron-Vitamins (GERITOL COMPLETE) TABS Take 1 tablet by mouth daily.     lamoTRIgine (LAMICTAL) 200 MG tablet Take 200 mg by mouth at bedtime.     levothyroxine (SYNTHROID) 175 MCG tablet Take 1 tablet (175 mcg total) by mouth daily before breakfast. 90 tablet 1   lubiprostone (AMITIZA) 24 MCG capsule TAKE 1 CAPSULE(24 MCG) BY MOUTH TWICE DAILY WITH A MEAL 60 capsule 5   magnesium oxide (MAG-OX) 400 MG tablet Take 400 mg by mouth daily.     ondansetron (ZOFRAN) 4 MG tablet TAKE 1 TABLET(4 MG) BY MOUTH EVERY 8 HOURS AS NEEDED FOR NAUSEA OR VOMITING 60 tablet 0   pantoprazole (PROTONIX) 40 MG tablet Take 1 tablet (40 mg total) by mouth daily. 90 tablet 0   propranolol (INDERAL) 10 MG tablet Take 10 mg by mouth at bedtime.  QUEtiapine (SEROQUEL) 50 MG tablet Take 100 mg by mouth at bedtime.     rifaximin (XIFAXAN) 550 MG TABS tablet Take 550 mg by mouth 2 (two) times daily.     sertraline (ZOLOFT) 100 MG tablet Take 100 mg by mouth in the morning and at bedtime.     sevelamer carbonate (RENVELA) 800 MG tablet Take 800 mg by mouth 3 (three) times daily with meals.     spironolactone (ALDACTONE) 50 MG tablet Take 100 mg by mouth 2 (two) times daily.     thiamine 100 MG tablet Take 100 mg by mouth daily.     traMADol (ULTRAM) 50 MG tablet TAKE 2 TABLETS BY MOUTH TWICE DAILY FOR BACK PAIN 120 tablet 0   triamcinolone (KENALOG) 0.1 % paste APPLY TO MOUTH OR THROAT TWICE DAILY 5 g 3   Vitamin D, Ergocalciferol, (DRISDOL) 1.25 MG (50000 UNIT) CAPS capsule TAKE 1 CAPSULE BY MOUTH 2 TIMES A WEEK 24 capsule 0   zolpidem (AMBIEN) 10 MG tablet TAKE 1  TABLET(10 MG) BY MOUTH AT BEDTIME AS NEEDED FOR SLEEP. STOP DAYVIGO 30 tablet 3   No current facility-administered medications on file prior to visit.   Past Medical History:  Diagnosis Date   Alcohol dependence in remission ([place]) 1/61/0960   Alcoholic cirrhosis of liver ([facility])    Anemia    Anemia of chronic disease 08/08/2016   Arterial hypotension 05/08/2020   Bipolar disorder current episode depressed ([place])    Brain TIA 02/19/2020   CKD (chronic kidney disease)    Episodic mood disorder (H[facility]) 11/07/2013   Generalized anxiety disorder    Generalized weakness 07/11/2020   GERD (gastroesophageal reflux disease)    H/O bariatric surgery 06/11/2020   Hypocalcemia 12/10/2015   Hypokalemia    Hyponatremia 02/19/2020   Intestinal malabsorption    Iron deficiency anemia due to chronic blood loss 05/08/2020   Korsakoff syndrome ([place])    Lumbar disc disease 10/19/2019   Microscopic hematuria 04/16/2021   Migraine 04/20/2014   Mild recurrent major depression ([place]) 04/19/2020   Multiple closed fractures of ribs of left side 06/03/2020   Formatting of this note might be different from the original. Added automatically from request for surgery [id number]   Neuropathy 01/31/2020   Obsessive-compulsive disorder 11/07/2013   Other osteoporosis without current pathological fracture    Peripheral edema 11/01/2020   Polypharmacy 06/12/2020   Portal hypertension ([place])    Post-surgical hypothyroidism 12/10/2015   Primary insomnia 03/26/2021   Severe protein-calorie malnutrition ([place]) 03/26/2021   Shortness of breath 11/01/2020   Stage 3b chronic kidney disease ([place]) 04/16/2021   Stroke ([place])     left basal ganglia stroke (hemorrhagic)   Telogen effluvium 03/26/2021   Ulcer of left foot with fat layer exposed ([place]) 10/04/2020   Ulcer of right foot, with fat layer exposed ([place]) 10/10/2020   Vitamin D deficiency    Past Surgical History:  Procedure Laterality Date   BREAST EXCISIONAL BIOPSY Left    CATARACT  EXTRACTION     GASTRIC BYPASS  2005   HEMORRHOID SURGERY     HERNIA REPAIR     metatarsus abductus surgery Left 1990   PARTIAL HYSTERECTOMY  05/1997   BL Ovaries remain. Done for fibroids.   THYROIDECTOMY  03/2013    Family History  Problem Relation Age of Onset   Breast cancer Mother    Osteoporosis Mother    Cancer Mother        Breast, lung, skin.  Breast cancer Maternal Grandmother    Social History   Socioeconomic History   Marital status: Married    Spouse name: Not on file   Number of children: 1   Years of education: Not on file   Highest education level: Not on file  Occupational History   Occupation: Engineer, maintenance (IT)    Comment: Retired.  Tobacco Use   Smoking status: Never   Smokeless tobacco: Never  Substance and Sexual Activity   Alcohol use: Not Currently    Comment: history of alcoholism. sober since 2014.   Drug use: Never   Sexual activity: Yes  Other Topics Concern   Not on file  Social History Narrative   Right handed   Social Determinants of Health   Financial Resource Strain: Low Risk  (05/20/2021)   Overall Financial Resource Strain (CARDIA)    Difficulty of Paying Living Expenses: Not hard at all  Food Insecurity: No Food Insecurity (05/20/2021)   Hunger Vital Sign    Worried About Running Out of Food in the Last Year: Never true    Ran Out of Food in the Last Year: Never true  Transportation Needs: No Transportation Needs (05/20/2021)   PRAPARE - Hydrologist (Medical): No    Lack of Transportation (Non-Medical): No  Physical Activity: Not on file  Stress: Not on file  Social Connections: Not on file    Review of Systems  Constitutional:  Positive for fatigue and unexpected weight change. Negative for chills and fever.  HENT:  Negative for congestion, ear pain and sore throat.   Respiratory:  Positive for shortness of breath. Negative for cough.   Cardiovascular:  Negative for chest pain and palpitations.   Gastrointestinal:  Positive for constipation, nausea and vomiting. Negative for abdominal pain and diarrhea.       Abdominal bloating   Endocrine: Positive for polydipsia. Negative for polyphagia.  Genitourinary:  Negative for dysuria, hematuria and urgency.       Poor bladder control  Musculoskeletal:  Positive for myalgias. Negative for arthralgias and back pain.  Skin:  Negative for rash.  Neurological:  Positive for weakness. Negative for dizziness and headaches.  Psychiatric/Behavioral:  Negative for dysphoric mood. The patient is nervous/anxious.      Objective:  BP (!) 92/56   Pulse 92   Temp (!) 97.4 F (36.3 C)   Resp 16   Ht '5\' 6"'$  (1.676 m)   Wt 156 lb (70.8 kg)   BMI 25.18 kg/m      06/15/2022    9:55 AM 05/06/2022    9:03 AM 03/06/2022    8:50 AM  BP/Weight  Systolic BP 92 916 98  Diastolic BP 56 68 60  Wt. (Lbs) 156 153 151  BMI 25.18 kg/m2 24.69 kg/m2 24.37 kg/m2    Physical Exam Vitals reviewed.  Constitutional:      Appearance: Normal appearance. She is normal weight.  HENT:     Mouth/Throat:     Comments: Poor dentition. Neck:     Vascular: No carotid bruit.  Cardiovascular:     Rate and Rhythm: Normal rate and regular rhythm.     Heart sounds: Normal heart sounds.  Pulmonary:     Effort: Pulmonary effort is normal. No respiratory distress.     Breath sounds: Normal breath sounds.  Abdominal:     General: Abdomen is flat. Bowel sounds are normal.     Palpations: Abdomen is soft.     Tenderness: There is  no abdominal tenderness.  Musculoskeletal:     Comments: Right midfoot deformity. Claw deformity.   Skin:    Findings: No lesion.  Neurological:     Mental Status: She is alert and oriented to person, place, and time.     Gait: Gait abnormal (slowed. uses walker. ).  Psychiatric:        Mood and Affect: Mood normal.        Behavior: Behavior normal.     Diabetic Foot Exam - Simple   No data filed      Lab Results  Component  Value Date   WBC 14.1 (H) 06/15/2022   HGB 9.4 (L) 06/15/2022   HCT 29.2 (L) 06/15/2022   PLT 357 06/15/2022   GLUCOSE 69 (L) 06/15/2022   CHOL 141 10/22/2021   TRIG 51 10/22/2021   HDL 71 10/22/2021   LDLCALC 59 10/22/2021   ALT 13 06/15/2022   AST 15 06/15/2022   NA 133 (L) 06/15/2022   K 4.1 06/15/2022   CL 98 06/15/2022   CREATININE 1.15 (H) 06/15/2022   BUN 34 (H) 06/15/2022   CO2 23 06/15/2022   TSH 1.050 06/15/2022      Assessment & Plan:   Problem List Items Addressed This Visit       Digestive   Alcoholic cirrhosis of liver ([place])    Management per hepatologist.        Endocrine   Post-surgical hypothyroidism    Previously well controlled Continue Synthroid at current dose  Recheck TSH and adjust Synthroid as indicated        Relevant Orders   TSH (Completed)   T4, free (Completed)   Hypoglycemia    Sent Gvoke to the pharmacy.      Relevant Medications   Glucagon (GVOKE HYPOPEN 2-PACK) 0.5 MG/0.1ML SOAJ     Other   Anemia of chronic disease    Check cbc.      Relevant Orders   CBC with Differential/Platelet (Completed)   Hypocalcemia - Primary    Check labs.  Improved.      Relevant Orders   Comprehensive metabolic panel (Completed)   Generalized anxiety disorder    Discuss changing sleep medicine and anxiety medicine with Dr. Erling Cruz.      Vitamin D deficiency    Check labs.      Generalized edema    Discuss possible change in diuretic (lasix to torsemide) with Dr. Audie Clear.       Hypomagnesemia    Check labs      Other Visit Diagnoses     Need for influenza vaccination       Relevant Orders   Flu Vaccine MDCK QUAD PF (Completed)   Need for COVID-19 vaccine       Relevant Orders   Pfizer Fall 2023 Covid-19 Vaccine 28yr and older (Completed)       Orders Placed This Encounter  Procedures   Flu Vaccine MDCK QUAD PF   Pfizer Fall 2023 Covid-19 Vaccine 141yrand older   CBC with Differential/Platelet   Comprehensive  metabolic panel   TSH   T4, free     Follow-up: Return in about 3 months (around 09/15/2022) for chronic follow up.  An After Visit Summary was printed and given to the patient.  KiRochel BromeMD [facility] [phone]

## 2022-06-16 LAB — CBC WITH DIFFERENTIAL/PLATELET
Basophils Absolute: 0.1 10*3/uL (ref 0.0–0.2)
Basos: 1 %
EOS (ABSOLUTE): 0.3 10*3/uL (ref 0.0–0.4)
Eos: 2 %
Hematocrit: 29.2 % — ABNORMAL LOW (ref 34.0–46.6)
Hemoglobin: 9.4 g/dL — ABNORMAL LOW (ref 11.1–15.9)
Immature Grans (Abs): 0.1 10*3/uL (ref 0.0–0.1)
Immature Granulocytes: 1 %
Lymphocytes Absolute: 1.4 10*3/uL (ref 0.7–3.1)
Lymphs: 10 %
MCH: 30.5 pg (ref 26.6–33.0)
MCHC: 32.2 g/dL (ref 31.5–35.7)
MCV: 95 fL (ref 79–97)
Monocytes Absolute: 0.8 10*3/uL (ref 0.1–0.9)
Monocytes: 6 %
Neutrophils Absolute: 11.4 10*3/uL — ABNORMAL HIGH (ref 1.4–7.0)
Neutrophils: 80 %
Platelets: 357 10*3/uL (ref 150–450)
RBC: 3.08 x10E6/uL — ABNORMAL LOW (ref 3.77–5.28)
RDW: 15.8 % — ABNORMAL HIGH (ref 11.7–15.4)
WBC: 14.1 10*3/uL — ABNORMAL HIGH (ref 3.4–10.8)

## 2022-06-16 LAB — COMPREHENSIVE METABOLIC PANEL
ALT: 13 IU/L (ref 0–32)
AST: 15 IU/L (ref 0–40)
Albumin/Globulin Ratio: 1.2 (ref 1.2–2.2)
Albumin: 2.9 g/dL — ABNORMAL LOW (ref 3.8–4.9)
Alkaline Phosphatase: 266 IU/L — ABNORMAL HIGH (ref 44–121)
BUN/Creatinine Ratio: 30 — ABNORMAL HIGH (ref 9–23)
BUN: 34 mg/dL — ABNORMAL HIGH (ref 6–24)
Bilirubin Total: 0.6 mg/dL (ref 0.0–1.2)
CO2: 23 mmol/L (ref 20–29)
Calcium: 7.1 mg/dL — ABNORMAL LOW (ref 8.7–10.2)
Chloride: 98 mmol/L (ref 96–106)
Creatinine, Ser: 1.15 mg/dL — ABNORMAL HIGH (ref 0.57–1.00)
Globulin, Total: 2.4 g/dL (ref 1.5–4.5)
Glucose: 69 mg/dL — ABNORMAL LOW (ref 70–99)
Potassium: 4.1 mmol/L (ref 3.5–5.2)
Sodium: 133 mmol/L — ABNORMAL LOW (ref 134–144)
Total Protein: 5.3 g/dL — ABNORMAL LOW (ref 6.0–8.5)
eGFR: 55 mL/min/{1.73_m2} — ABNORMAL LOW (ref >59)

## 2022-06-16 LAB — TSH: TSH: 1.05 u[IU]/mL (ref 0.450–4.500)

## 2022-06-16 LAB — T4, FREE: Free T4: 1.18 ng/dL (ref 0.82–1.77)

## 2022-06-16 NOTE — Progress Notes (Signed)
Blood count abnormal. Wbc elevated. Is patient feeling sick? Liver function normal.  Kidney function abnormal.  Thyroid function normal. Therapeutic. Calcium 7.1.

## 2022-06-18 ENCOUNTER — Other Ambulatory Visit: Payer: Self-pay | Admitting: Family Medicine

## 2022-06-18 ENCOUNTER — Other Ambulatory Visit: Payer: Self-pay | Admitting: Physician Assistant

## 2022-06-18 DIAGNOSIS — E162 Hypoglycemia, unspecified: Secondary | ICD-10-CM | POA: Insufficient documentation

## 2022-06-18 NOTE — Assessment & Plan Note (Signed)
Check labs 

## 2022-06-18 NOTE — Assessment & Plan Note (Signed)
Discuss changing sleep medicine and anxiety medicine with Dr. Erling Cruz.

## 2022-06-18 NOTE — Assessment & Plan Note (Signed)
Sent Gvoke to the pharmacy.

## 2022-06-18 NOTE — Assessment & Plan Note (Signed)
Discuss possible change in diuretic (lasix to torsemide) with Dr. Audie Clear.

## 2022-06-18 NOTE — Assessment & Plan Note (Signed)
Previously well controlled Continue Synthroid at current dose  Recheck TSH and adjust Synthroid as indicated   

## 2022-06-21 NOTE — Assessment & Plan Note (Signed)
Management per hepatologist.

## 2022-06-21 NOTE — Assessment & Plan Note (Signed)
Check labs.  Improved.

## 2022-06-21 NOTE — Assessment & Plan Note (Signed)
Check cbc 

## 2022-06-26 ENCOUNTER — Other Ambulatory Visit: Payer: Self-pay

## 2022-06-26 DIAGNOSIS — R110 Nausea: Secondary | ICD-10-CM

## 2022-06-26 MED ORDER — ONDANSETRON HCL 4 MG PO TABS: ORAL_TABLET | ORAL | 0 refills | Status: DC

## 2022-06-29 ENCOUNTER — Encounter: Payer: Self-pay | Admitting: Family Medicine

## 2022-06-30 ENCOUNTER — Other Ambulatory Visit: Payer: Self-pay

## 2022-06-30 MED ORDER — ALPRAZOLAM 0.25 MG PO TABS: 0.2500 mg | ORAL_TABLET | Freq: Two times a day (BID) | ORAL | 1 refills | Status: DC

## 2022-07-16 ENCOUNTER — Other Ambulatory Visit: Payer: Self-pay | Admitting: Family Medicine

## 2022-07-19 ENCOUNTER — Other Ambulatory Visit: Payer: Self-pay | Admitting: Family Medicine

## 2022-07-23 ENCOUNTER — Ambulatory Visit (INDEPENDENT_AMBULATORY_CARE_PROVIDER_SITE_OTHER): Payer: 59 | Admitting: Family Medicine

## 2022-07-23 DIAGNOSIS — J0181 Other acute recurrent sinusitis: Secondary | ICD-10-CM

## 2022-07-23 DIAGNOSIS — J329 Chronic sinusitis, unspecified: Secondary | ICD-10-CM

## 2022-07-23 DIAGNOSIS — F5101 Primary insomnia: Secondary | ICD-10-CM

## 2022-07-23 DIAGNOSIS — D638 Anemia in other chronic diseases classified elsewhere: Secondary | ICD-10-CM

## 2022-07-23 DIAGNOSIS — N1832 Chronic kidney disease, stage 3b: Secondary | ICD-10-CM | POA: Diagnosis not present

## 2022-07-23 MED ORDER — ZALEPLON 5 MG PO CAPS: 5.0000 mg | ORAL_CAPSULE | Freq: Every day | ORAL | 2 refills | Status: DC

## 2022-07-23 MED ORDER — AMOXICILLIN-POT CLAVULANATE 875-125 MG PO TABS: 1.0000 | ORAL_TABLET | Freq: Two times a day (BID) | ORAL | 0 refills | Status: DC

## 2022-07-23 NOTE — Progress Notes (Signed)
Subjective:  Patient ID: Molly Parrish, female    DOB: 05/26/1966  Age: 57 y.o. MRN: [id number]  Chief Complaint  Patient presents with   Otalgia    Left    Sore Throat   Numbness    Hands and feet    HPI:  Molly Parrish comes in with earache and sore throat which started about 1 week ago.  She has been experiencing numbness and tingling of her hands and feet for the last 2 weeks.    No fever, chills, sweats. Patient has nasal congestion and coughing.   Pedal edema worsening. Lasix 40 mg 2 oral three times a day. Dypsnea with exertion and with laying down flat.   Insomnia: sleeps about 3 hours and then awakens. Has a sound machine. Has a humidifier. Never uses phone in bed. Taking ambien 10 mg before bed. Poor sleep x 2 weeks. Added melatonin 15 mg 3 before bed.  Failed dayvigo, lunesta, temazepam, and ambien.     Current Outpatient Medications on File Prior to Visit  Medication Sig Dispense Refill   ALPRAZolam (XANAX) 0.25 MG tablet Take 1 tablet (0.25 mg total) by mouth 2 (two) times daily. 60 tablet 1   calcitRIOL (ROCALTROL) 0.5 MCG capsule Take 0.5 mcg by mouth 2 (two) times daily.     calcium carbonate (OSCAL) 1500 (600 Ca) MG TABS tablet Take 3,600 mg by mouth 2 (two) times daily with a meal.     Cyanocobalamin (VITAMIN B 12 PO) Take 1,000 mcg by mouth daily.     cyclobenzaprine (FLEXERIL) 10 MG tablet TAKE 1 TABLET(10 MG) BY MOUTH THREE TIMES DAILY AS NEEDED FOR MUSCLE SPASMS 90 tablet 2   famotidine (PEPCID) 40 MG tablet Take 40 mg by mouth at bedtime.     Glucagon (GVOKE HYPOPEN 2-PACK) 0.5 MG/0.1ML SOAJ If glucose less than 60, use gvoke pen x 1. May repeat if not responding x 1 daily. Call EMS if not improving. 0.2 mL 2   hydrOXYzine (VISTARIL) 25 MG capsule TAKE 1 CAPSULE(25 MG) BY MOUTH THREE TIMES DAILY 90 capsule 2   Iron-Vitamins (GERITOL COMPLETE) TABS Take 1 tablet by mouth daily.     lamoTRIgine (LAMICTAL) 200 MG tablet Take 200 mg by mouth at bedtime.      levothyroxine (SYNTHROID) 175 MCG tablet Take 1 tablet (175 mcg total) by mouth daily before breakfast. 90 tablet 1   lubiprostone (AMITIZA) 24 MCG capsule TAKE 1 CAPSULE(24 MCG) BY MOUTH TWICE DAILY WITH A MEAL 60 capsule 5   magnesium oxide (MAG-OX) 400 MG tablet Take 400 mg by mouth daily.     ondansetron (ZOFRAN) 4 MG tablet TAKE 1 TABLET(4 MG) BY MOUTH EVERY 8 HOURS AS NEEDED FOR NAUSEA OR VOMITING 60 tablet 0   pantoprazole (PROTONIX) 40 MG tablet Take 1 tablet (40 mg total) by mouth daily. 90 tablet 0   promethazine (PHENERGAN) 25 MG tablet TAKE 1 TABLET BY MOUTH FOUR TIMES DAILY AS NEEDED FOR NAUSEA OR VOMITING 40 tablet 2   propranolol (INDERAL) 10 MG tablet Take 10 mg by mouth at bedtime.     QUEtiapine (SEROQUEL) 50 MG tablet Take 100 mg by mouth at bedtime.     rifaximin (XIFAXAN) 550 MG TABS tablet Take 550 mg by mouth 2 (two) times daily.     sertraline (ZOLOFT) 100 MG tablet Take 100 mg by mouth in the morning and at bedtime.     sevelamer carbonate (RENVELA) 800 MG tablet Take 800 mg by mouth 3 (  three) times daily with meals.     spironolactone (ALDACTONE) 50 MG tablet Take 100 mg by mouth 2 (two) times daily.     thiamine 100 MG tablet Take 100 mg by mouth daily.     traMADol (ULTRAM) 50 MG tablet TAKE 2 TABLETS BY MOUTH TWICE DAILY FOR BACK PAIN 120 tablet 0   Vitamin D, Ergocalciferol, (DRISDOL) 1.25 MG (50000 UNIT) CAPS capsule TAKE 1 CAPSULE BY MOUTH 2 TIMES A WEEK 24 capsule 0   No current facility-administered medications on file prior to visit.   Past Medical History:  Diagnosis Date   Alcohol dependence in remission ([place]) 1/61/0960   Alcoholic cirrhosis of liver (HCC)    Anemia    Anemia of chronic disease 08/08/2016   Arterial hypotension 05/08/2020   Bipolar disorder current episode depressed ([place])    Brain TIA 02/19/2020   CKD (chronic kidney disease)    Episodic mood disorder (HCC) 11/07/2013   Generalized anxiety disorder    Generalized weakness 07/11/2020    GERD (gastroesophageal reflux disease)    H/O bariatric surgery 06/11/2020   Hypocalcemia 12/10/2015   Hypokalemia    Hyponatremia 02/19/2020   Intestinal malabsorption    Iron deficiency anemia due to chronic blood loss 05/08/2020   Korsakoff syndrome ([place])    Lumbar disc disease 10/19/2019   Microscopic hematuria 04/16/2021   Migraine 04/20/2014   Mild recurrent major depression ([place]) 04/19/2020   Multiple closed fractures of ribs of left side 06/03/2020   Formatting of this note might be different from the original. Added automatically from request for surgery [id number]   Neuropathy 01/31/2020   Obsessive-compulsive disorder 11/07/2013   Other osteoporosis without current pathological fracture    Peripheral edema 11/01/2020   Polypharmacy 06/12/2020   Portal hypertension ([place])    Post-surgical hypothyroidism 12/10/2015   Primary insomnia 03/26/2021   Severe protein-calorie malnutrition ([place]) 03/26/2021   Shortness of breath 11/01/2020   Stage 3b chronic kidney disease ([place]) 04/16/2021   Stroke ([place])     left basal ganglia stroke (hemorrhagic)   Telogen effluvium 03/26/2021   Ulcer of left foot with fat layer exposed ([place]) 10/04/2020   Ulcer of right foot, with fat layer exposed ([place]) 10/10/2020   Vitamin D deficiency    Past Surgical History:  Procedure Laterality Date   BREAST EXCISIONAL BIOPSY Left    CATARACT EXTRACTION     GASTRIC BYPASS  2005   HEMORRHOID SURGERY     HERNIA REPAIR     metatarsus abductus surgery Left 1990   PARTIAL HYSTERECTOMY  05/1997   BL Ovaries remain. Done for fibroids.   THYROIDECTOMY  03/2013    Family History  Problem Relation Age of Onset   Breast cancer Mother    Osteoporosis Mother    Cancer Mother        Breast, lung, skin.    Breast cancer Maternal Grandmother    Social History   Socioeconomic History   Marital status: Married    Spouse name: Not on file   Number of children: 1   Years of education: Not on file   Highest education level: Not  on file  Occupational History   Occupation: Engineer, maintenance (IT)    Comment: Retired.  Tobacco Use   Smoking status: Never   Smokeless tobacco: Never  Substance and Sexual Activity   Alcohol use: Not Currently    Comment: history of alcoholism. sober since 2014.   Drug use: Never   Sexual activity: Yes  Other Topics  Concern   Not on file  Social History Narrative   Right handed   Social Determinants of Health   Financial Resource Strain: Low Risk  (05/20/2021)   Overall Financial Resource Strain (CARDIA)    Difficulty of Paying Living Expenses: Not hard at all  Food Insecurity: No Food Insecurity (05/20/2021)   Hunger Vital Sign    Worried About Running Out of Food in the Last Year: Never true    Ran Out of Food in the Last Year: Never true  Transportation Needs: No Transportation Needs (05/20/2021)   PRAPARE - Hydrologist (Medical): No    Lack of Transportation (Non-Medical): No  Physical Activity: Not on file  Stress: Not on file  Social Connections: Not on file    Review of Systems  Constitutional:  Positive for fatigue. Negative for chills and fever.  HENT:  Positive for ear pain and sore throat. Negative for congestion and rhinorrhea.   Respiratory:  Positive for cough. Negative for shortness of breath.   Cardiovascular:  Positive for leg swelling. Negative for chest pain.  Gastrointestinal:  Negative for abdominal pain, constipation, diarrhea, nausea and vomiting.  Genitourinary:  Negative for dysuria and urgency.  Musculoskeletal:  Negative for back pain and myalgias.  Neurological:  Positive for numbness (numbness and tingling of hands and feet.). Negative for dizziness, weakness, light-headedness and headaches.  Psychiatric/Behavioral:  Negative for dysphoric mood. The patient is not nervous/anxious.      Objective:  BP 116/60   Pulse 96   Temp (!) 97.4 F (36.3 C)   Resp 18   Ht '5\' 6"'$  (1.676 m)   Wt 158 lb (71.7 kg)   BMI 25.50 kg/m       07/23/2022    1:47 PM 06/15/2022    9:55 AM 05/06/2022    9:03 AM  BP/Weight  Systolic BP 967 92 893  Diastolic BP 60 56 68  Wt. (Lbs) 158 156 153  BMI 25.5 kg/m2 25.18 kg/m2 24.69 kg/m2    Physical Exam Vitals reviewed.  Constitutional:      Appearance: Normal appearance. She is well-developed.  HENT:     Right Ear: Tympanic membrane, ear canal and external ear normal.     Left Ear: Tympanic membrane, ear canal and external ear normal.     Nose: Congestion present.     Comments: Sinus tenderness    Mouth/Throat:     Pharynx: Oropharynx is clear.     Comments: Sour milk smell of breath Cardiovascular:     Rate and Rhythm: Normal rate and regular rhythm.     Heart sounds: Normal heart sounds. No murmur heard. Pulmonary:     Effort: Pulmonary effort is normal. No respiratory distress.     Breath sounds: Normal breath sounds.  Abdominal:     General: Bowel sounds are normal.     Tenderness: There is no abdominal tenderness.  Lymphadenopathy:     Cervical: No cervical adenopathy.  Neurological:     Mental Status: She is alert and oriented to person, place, and time.  Psychiatric:        Mood and Affect: Mood normal.        Behavior: Behavior normal.     Diabetic Foot Exam - Simple   No data filed      Lab Results  Component Value Date   WBC 12.3 (H) 07/23/2022   HGB 9.2 (L) 07/23/2022   HCT 28.1 (L) 07/23/2022   PLT 326 07/23/2022  GLUCOSE 77 07/23/2022   CHOL 141 10/22/2021   TRIG 51 10/22/2021   HDL 71 10/22/2021   LDLCALC 59 10/22/2021   ALT 18 07/23/2022   AST 18 07/23/2022   NA 137 07/23/2022   K 3.8 07/23/2022   CL 99 07/23/2022   CREATININE 1.09 (H) 07/23/2022   BUN 37 (H) 07/23/2022   CO2 23 07/23/2022   TSH 1.050 06/15/2022      Assessment & Plan:   Problem List Items Addressed This Visit       Respiratory   Sinusitis    Augmentin prescription for sinusitis.       Relevant Medications   amoxicillin-clavulanate (AUGMENTIN) 875-125  MG tablet     Genitourinary   Stage 3b chronic kidney disease ([place])    Management per specialist.  Dr. Audie Clear.       Relevant Orders   Comprehensive metabolic panel (Completed)     Other   Anemia of chronic disease    Check cbc       Relevant Orders   CBC with Differential/Platelet (Completed)   Hypocalcemia - Primary    Check cmp       Relevant Orders   Calcium, ionized (Completed)   Primary insomnia    Stop ambien. Start sonata 5 mg before bed.       Hypomagnesemia    Check cmp      Relevant Orders   Phosphorus (Completed)   Magnesium (Completed)  .  Meds ordered this encounter  Medications   amoxicillin-clavulanate (AUGMENTIN) 875-125 MG tablet    Sig: Take 1 tablet by mouth 2 (two) times daily.    Dispense:  20 tablet    Refill:  0   zaleplon (SONATA) 5 MG capsule    Sig: Take 1 capsule (5 mg total) by mouth at bedtime.    Dispense:  30 capsule    Refill:  2    Orders Placed This Encounter  Procedures   CBC with Differential/Platelet   Comprehensive metabolic panel   Phosphorus   Magnesium   Calcium, ionized     Follow-up: Return in about 1 month (around 08/23/2022).  An After Visit Summary was printed and given to the patient.  Rochel Brome, MD [facility] [phone]

## 2022-07-23 NOTE — Patient Instructions (Signed)
Augmentin prescription for sinusitis.  Stop ambien. Start sonata 5 mg before bed.

## 2022-07-24 LAB — MAGNESIUM: Magnesium: 1.9 mg/dL (ref 1.6–2.3)

## 2022-07-24 LAB — COMPREHENSIVE METABOLIC PANEL
ALT: 18 IU/L (ref 0–32)
AST: 18 IU/L (ref 0–40)
Albumin/Globulin Ratio: 1.2 (ref 1.2–2.2)
Albumin: 2.8 g/dL — ABNORMAL LOW (ref 3.8–4.9)
Alkaline Phosphatase: 193 IU/L — ABNORMAL HIGH (ref 44–121)
BUN/Creatinine Ratio: 34 — ABNORMAL HIGH (ref 9–23)
BUN: 37 mg/dL — ABNORMAL HIGH (ref 6–24)
Bilirubin Total: 0.5 mg/dL (ref 0.0–1.2)
CO2: 23 mmol/L (ref 20–29)
Calcium: 7.4 mg/dL — ABNORMAL LOW (ref 8.7–10.2)
Chloride: 99 mmol/L (ref 96–106)
Creatinine, Ser: 1.09 mg/dL — ABNORMAL HIGH (ref 0.57–1.00)
Globulin, Total: 2.4 g/dL (ref 1.5–4.5)
Glucose: 77 mg/dL (ref 70–99)
Potassium: 3.8 mmol/L (ref 3.5–5.2)
Sodium: 137 mmol/L (ref 134–144)
Total Protein: 5.2 g/dL — ABNORMAL LOW (ref 6.0–8.5)
eGFR: 59 mL/min/{1.73_m2} — ABNORMAL LOW (ref >59)

## 2022-07-24 LAB — CBC WITH DIFFERENTIAL/PLATELET
Basophils Absolute: 0.1 10*3/uL (ref 0.0–0.2)
Basos: 1 %
EOS (ABSOLUTE): 0.2 10*3/uL (ref 0.0–0.4)
Eos: 2 %
Hematocrit: 28.1 % — ABNORMAL LOW (ref 34.0–46.6)
Hemoglobin: 9.2 g/dL — ABNORMAL LOW (ref 11.1–15.9)
Immature Grans (Abs): 0.1 10*3/uL (ref 0.0–0.1)
Immature Granulocytes: 1 %
Lymphocytes Absolute: 1.5 10*3/uL (ref 0.7–3.1)
Lymphs: 13 %
MCH: 31.4 pg (ref 26.6–33.0)
MCHC: 32.7 g/dL (ref 31.5–35.7)
MCV: 96 fL (ref 79–97)
Monocytes Absolute: 0.7 10*3/uL (ref 0.1–0.9)
Monocytes: 6 %
Neutrophils Absolute: 9.7 10*3/uL — ABNORMAL HIGH (ref 1.4–7.0)
Neutrophils: 77 %
Platelets: 326 10*3/uL (ref 150–450)
RBC: 2.93 x10E6/uL — ABNORMAL LOW (ref 3.77–5.28)
RDW: 13.6 % (ref 11.7–15.4)
WBC: 12.3 10*3/uL — ABNORMAL HIGH (ref 3.4–10.8)

## 2022-07-24 LAB — PHOSPHORUS: Phosphorus: 3.7 mg/dL (ref 3.0–4.3)

## 2022-07-24 LAB — CALCIUM, IONIZED: Calcium, Ion: 4.4 mg/dL — ABNORMAL LOW (ref 4.5–5.6)

## 2022-07-27 ENCOUNTER — Encounter: Payer: Self-pay | Admitting: Family Medicine

## 2022-07-27 ENCOUNTER — Other Ambulatory Visit: Payer: Self-pay | Admitting: Family Medicine

## 2022-07-27 MED ORDER — FUROSEMIDE 40 MG PO TABS: 80.0000 mg | ORAL_TABLET | Freq: Three times a day (TID) | ORAL | 0 refills | Status: DC

## 2022-07-28 ENCOUNTER — Other Ambulatory Visit: Payer: Self-pay | Admitting: Family Medicine

## 2022-07-28 ENCOUNTER — Encounter: Payer: Self-pay | Admitting: Family Medicine

## 2022-07-28 DIAGNOSIS — D229 Melanocytic nevi, unspecified: Secondary | ICD-10-CM

## 2022-07-28 DIAGNOSIS — K644 Residual hemorrhoidal skin tags: Secondary | ICD-10-CM

## 2022-07-28 MED ORDER — RAMELTEON 8 MG PO TABS: 8.0000 mg | ORAL_TABLET | Freq: Every day | ORAL | 0 refills | Status: DC

## 2022-07-29 ENCOUNTER — Telehealth: Payer: Self-pay

## 2022-07-29 ENCOUNTER — Other Ambulatory Visit: Payer: Self-pay | Admitting: Family Medicine

## 2022-07-29 MED ORDER — GABAPENTIN 100 MG PO CAPS: 100.0000 mg | ORAL_CAPSULE | Freq: Three times a day (TID) | ORAL | 1 refills | Status: DC

## 2022-07-29 NOTE — Telephone Encounter (Signed)
Patient called and stated that she is having numbness and tingling in her hands and feet and would like to know if you could send in Gabapentin? She says it has worked for her in the past.  Please advise

## 2022-07-30 ENCOUNTER — Other Ambulatory Visit: Payer: Self-pay | Admitting: Family Medicine

## 2022-08-02 ENCOUNTER — Encounter: Payer: Self-pay | Admitting: Family Medicine

## 2022-08-02 DIAGNOSIS — J329 Chronic sinusitis, unspecified: Secondary | ICD-10-CM | POA: Insufficient documentation

## 2022-08-02 NOTE — Assessment & Plan Note (Signed)
Management per specialist.  Dr. Audie Clear.

## 2022-08-02 NOTE — Assessment & Plan Note (Signed)
Check cmp 

## 2022-08-02 NOTE — Assessment & Plan Note (Signed)
Stop ambien. Start sonata 5 mg before bed.

## 2022-08-02 NOTE — Assessment & Plan Note (Signed)
Check cbc 

## 2022-08-02 NOTE — Assessment & Plan Note (Signed)
Augmentin prescription for sinusitis.

## 2022-08-03 ENCOUNTER — Other Ambulatory Visit: Payer: Self-pay

## 2022-08-03 MED ORDER — ZOLPIDEM TARTRATE 10 MG PO TABS: 10.0000 mg | ORAL_TABLET | Freq: Every evening | ORAL | 1 refills | Status: DC | PRN

## 2022-08-04 ENCOUNTER — Other Ambulatory Visit: Payer: Self-pay | Admitting: Family Medicine

## 2022-08-04 MED ORDER — LEVOTHYROXINE SODIUM 175 MCG PO TABS: 175.0000 ug | ORAL_TABLET | Freq: Every day | ORAL | 1 refills | Status: DC

## 2022-08-05 ENCOUNTER — Other Ambulatory Visit: Payer: Self-pay | Admitting: Family Medicine

## 2022-08-05 ENCOUNTER — Other Ambulatory Visit: Payer: Self-pay

## 2022-08-05 MED ORDER — TRIAMCINOLONE ACETONIDE 0.1 % MT PSTE: 1.0000 | PASTE | Freq: Two times a day (BID) | OROMUCOSAL | 2 refills | Status: DC

## 2022-08-05 MED ORDER — VITAMIN D (ERGOCALCIFEROL) 1.25 MG (50000 UNIT) PO CAPS: 50000.0000 [IU] | ORAL_CAPSULE | ORAL | 0 refills | Status: DC

## 2022-08-06 MED ORDER — VITAMIN D (ERGOCALCIFEROL) 1.25 MG (50000 UNIT) PO CAPS: 50000.0000 [IU] | ORAL_CAPSULE | ORAL | 0 refills | Status: DC

## 2022-08-19 ENCOUNTER — Ambulatory Visit: Payer: 59 | Admitting: Physician Assistant

## 2022-08-21 ENCOUNTER — Ambulatory Visit: Payer: 59 | Admitting: Family Medicine

## 2022-08-25 ENCOUNTER — Other Ambulatory Visit: Payer: Self-pay | Admitting: Family Medicine

## 2022-08-27 NOTE — Progress Notes (Signed)
[facility]  [address] [place],  [place]  [address] [phone]  Clinic Day:  08/28/2022  Referring physician: Rochel Brome, MD  HISTORY OF PRESENT ILLNESS:  The patient is a 57 y.o. female with a history of both leukocytosis and anemia.  She comes in today for routine follow-up.  The patient claims she has dealt with the flu, a sinus infection, and ear infection over these past months. With respect to her anemia, she denies having increased fatigue or any overt forms of blood loss.    PHYSICAL EXAM:  Blood pressure (!) 126/58, pulse 96, temperature 97.7 F (36.5 C), resp. rate 16, height 5' 6"$  (1.676 m), weight 167 lb 3.2 oz (75.8 kg), SpO2 96 %. Wt Readings from Last 3 Encounters:  08/28/22 167 lb 3.2 oz (75.8 kg)  07/23/22 158 lb (71.7 kg)  06/15/22 156 lb (70.8 kg)   Body mass index is 26.99 kg/m. Performance status (ECOG): 2 - Symptomatic, <50% confined to bed Physical Exam Constitutional:      Appearance: Normal appearance. She is ill-appearing (chronically ill appearance, but  she looks better vs previous visits).  HENT:     Mouth/Throat:     Mouth: Mucous membranes are moist.     Pharynx: Oropharynx is clear. No oropharyngeal exudate or posterior oropharyngeal erythema.  Cardiovascular:     Rate and Rhythm: Normal rate and regular rhythm.     Heart sounds: No murmur heard.    No friction rub. No gallop.  Pulmonary:     Effort: Pulmonary effort is normal. No respiratory distress.     Breath sounds: Examination of the left-lower field reveals rales. Rales present. No wheezing or rhonchi.  Abdominal:     General: Bowel sounds are normal. There is no distension.     Palpations: Abdomen is soft. There is no mass.     Tenderness: There is no abdominal tenderness.  Musculoskeletal:        General: No swelling.     Right lower leg: No edema.     Left lower leg: No edema.  Lymphadenopathy:     Cervical: No cervical adenopathy.      Upper Body:     Right upper body: No supraclavicular or axillary adenopathy.     Left upper body: No supraclavicular or axillary adenopathy.     Lower Body: No right inguinal adenopathy. No left inguinal adenopathy.  Skin:    General: Skin is warm.     Coloration: Skin is not jaundiced.     Findings: No lesion or rash.  Neurological:     General: No focal deficit present.     Mental Status: She is alert and oriented to person, place, and time. Mental status is at baseline.  Psychiatric:        Mood and Affect: Mood normal.        Behavior: Behavior normal.        Thought Content: Thought content normal.    LABS:    Latest Reference Range & Units 08/28/22 09:58  Iron 28 - 170 ug/dL 29  UIBC ug/dL 269  TIBC 250 - 450 ug/dL 298  Saturation Ratios 10.4 - 31.8 % 10 (L)  Ferritin 11 - 307 ng/mL 370 (H)  (L): Data is abnormally low (H): Data is abnormally high  SOLUBLE TRANSFERRIN PENDING      Latest Ref Rng & Units 07/23/2022    2:30 PM 06/15/2022   11:20 AM 05/06/2022    9:34  AM  CBC  WBC 3.4 - 10.8 x10E3/uL 12.3  14.1  10.1   Hemoglobin 11.1 - 15.9 g/dL 9.2  9.4  9.7   Hematocrit 34.0 - 46.6 % 28.1  29.2  31.2   Platelets 150 - 450 x10E3/uL 326  357  454     ASSESSMENT & PLAN:  Assessment/Plan:  A 57 y.o. female with both leukocytosis and anemia, which in the past has been related to iron deficiency.  When evaluating her labs today, her white count is the highest it has been in quite some time.  Based upon her abnormal lung exam, I ordered a chest x-ray today, which showed   With respect to her anemia, her iron studies showed  I will see her back in 2 months for repeat clinical assessment.  The patient understands all the plans discussed today and is in agreement with them.    Fredric Slabach Macarthur Critchley, MD

## 2022-08-28 ENCOUNTER — Telehealth: Payer: Self-pay

## 2022-08-28 ENCOUNTER — Inpatient Hospital Stay: Payer: 59 | Attending: Oncology | Admitting: Oncology

## 2022-08-28 ENCOUNTER — Inpatient Hospital Stay: Payer: 59

## 2022-08-28 ENCOUNTER — Other Ambulatory Visit: Payer: Self-pay | Admitting: Family Medicine

## 2022-08-28 ENCOUNTER — Other Ambulatory Visit: Payer: Self-pay | Admitting: Oncology

## 2022-08-28 ENCOUNTER — Telehealth: Payer: Self-pay | Admitting: Oncology

## 2022-08-28 VITALS — BP 126/58 | HR 96 | Temp 97.7°F | Resp 16 | Ht 66.0 in | Wt 167.2 lb

## 2022-08-28 DIAGNOSIS — D649 Anemia, unspecified: Secondary | ICD-10-CM | POA: Insufficient documentation

## 2022-08-28 DIAGNOSIS — D72829 Elevated white blood cell count, unspecified: Secondary | ICD-10-CM | POA: Diagnosis present

## 2022-08-28 DIAGNOSIS — Z79899 Other long term (current) drug therapy: Secondary | ICD-10-CM | POA: Diagnosis not present

## 2022-08-28 DIAGNOSIS — D508 Other iron deficiency anemias: Secondary | ICD-10-CM

## 2022-08-28 DIAGNOSIS — E611 Iron deficiency: Secondary | ICD-10-CM | POA: Diagnosis not present

## 2022-08-28 DIAGNOSIS — D500 Iron deficiency anemia secondary to blood loss (chronic): Secondary | ICD-10-CM | POA: Diagnosis not present

## 2022-08-28 LAB — CBC AND DIFFERENTIAL
HCT: 30 — AB (ref 36–46)
Hemoglobin: 9.7 — AB (ref 12.0–16.0)
Neutrophils Absolute: 16.69
Platelets: 412 10*3/uL — AB (ref 150–400)
WBC: 20.6

## 2022-08-28 LAB — CMP (CANCER CENTER ONLY)
ALT: 20 U/L (ref 0–44)
AST: 26 U/L (ref 15–41)
Albumin: 3.3 g/dL — ABNORMAL LOW (ref 3.5–5.0)
Alkaline Phosphatase: 123 U/L (ref 38–126)
Anion gap: 11 (ref 5–15)
BUN: 36 mg/dL — ABNORMAL HIGH (ref 6–20)
CO2: 25 mmol/L (ref 22–32)
Calcium: 8.2 mg/dL — ABNORMAL LOW (ref 8.9–10.3)
Chloride: 98 mmol/L (ref 98–111)
Creatinine: 1.37 mg/dL — ABNORMAL HIGH (ref 0.44–1.00)
GFR, Estimated: 45 mL/min — ABNORMAL LOW (ref >60)
Glucose, Bld: 116 mg/dL — ABNORMAL HIGH (ref 70–99)
Potassium: 4.2 mmol/L (ref 3.5–5.1)
Sodium: 134 mmol/L — ABNORMAL LOW (ref 135–145)
Total Bilirubin: 0.5 mg/dL (ref 0.3–1.2)
Total Protein: 7.1 g/dL (ref 6.5–8.1)

## 2022-08-28 LAB — CBC: RBC: 3.38 — AB (ref 3.87–5.11)

## 2022-08-28 LAB — IRON AND TIBC
Iron: 29 ug/dL (ref 28–170)
Saturation Ratios: 10 % — ABNORMAL LOW (ref 10.4–31.8)
TIBC: 298 ug/dL (ref 250–450)
UIBC: 269 ug/dL

## 2022-08-28 LAB — FERRITIN: Ferritin: 370 ng/mL — ABNORMAL HIGH (ref 11–307)

## 2022-08-28 NOTE — Telephone Encounter (Unsigned)
Dr Bobby Rumpf asked me to tell pt that her iron levels are borderline. He has placed order for a soluable transferrin to help him decide best course of action. Xray report is not back as of 1513-awc.   Latest Reference Range & Units 08/28/22 09:58   Iron 28 - 170 ug/dL 29  UIBC ug/dL 269  TIBC 250 - 450 ug/dL 298  Saturation Ratios 10.4 - 31.8 % 10 (L)  Ferritin 11 - 307 ng/mL 370 (H)  (L): Data is abnormally low (H): Data is abnormally high

## 2022-08-28 NOTE — Telephone Encounter (Signed)
Patient has been scheduled for follow-up visit per 08/28/22 LOS.  Pt given an appt calendar with date and time.

## 2022-08-30 LAB — SOLUBLE TRANSFERRIN RECEPTOR: Transferrin Receptor: 29 nmol/L — ABNORMAL HIGH (ref 12.2–27.3)

## 2022-08-31 ENCOUNTER — Telehealth: Payer: Self-pay | Admitting: Oncology

## 2022-08-31 ENCOUNTER — Encounter: Payer: Self-pay | Admitting: Oncology

## 2022-08-31 ENCOUNTER — Other Ambulatory Visit: Payer: Self-pay | Admitting: Family Medicine

## 2022-08-31 NOTE — Telephone Encounter (Signed)
Patient has been scheduled. Aware of appt date and time   Scheduling Message Entered by [place], AMY W on 08/31/2022 at 11:03 AM Priority: High INFUSION 1HR30MIN (90)  Department: [facility]  Provider: Marice Potter, MD  Appointment Notes:  Requests that pt have IV iron  Scheduling Notes:

## 2022-08-31 NOTE — Telephone Encounter (Signed)
Contacted pt to schedule an appt. She wishes to speak with her daughter for transportation reasons and will call us back to schedule IV Iron infusions.  Scheduling Message Entered by [place], AMY W on 08/31/2022 at 11:03 AM Priority: High INFUSION 1HR30MIN (90)  Department: [facility]  Provider: Marice Potter, MD  Appointment Notes:  Requests that pt have IV iron  Scheduling Notes:

## 2022-09-01 MED FILL — Iron Sucrose Inj 20 MG/ML (Fe Equiv): INTRAVENOUS | Qty: 10 | Status: AC

## 2022-09-02 ENCOUNTER — Inpatient Hospital Stay: Payer: 59

## 2022-09-02 VITALS — BP 95/61 | HR 86 | Temp 98.0°F | Resp 18

## 2022-09-02 DIAGNOSIS — D500 Iron deficiency anemia secondary to blood loss (chronic): Secondary | ICD-10-CM

## 2022-09-02 DIAGNOSIS — D72829 Elevated white blood cell count, unspecified: Secondary | ICD-10-CM | POA: Diagnosis not present

## 2022-09-02 MED ORDER — ACETAMINOPHEN 325 MG PO TABS
650.0000 mg | ORAL_TABLET | Freq: Once | ORAL | Status: AC
  Administered 2022-09-02: 650 mg via ORAL
  Filled 2022-09-02: qty 2

## 2022-09-02 MED ORDER — FAMOTIDINE IN NACL 20-0.9 MG/50ML-% IV SOLN
20.0000 mg | Freq: Once | INTRAVENOUS | Status: AC
  Administered 2022-09-02: 20 mg via INTRAVENOUS
  Filled 2022-09-02: qty 50

## 2022-09-02 MED ORDER — SODIUM CHLORIDE 0.9 % IV SOLN: Freq: Once | INTRAVENOUS | Status: AC

## 2022-09-02 MED ORDER — SODIUM CHLORIDE 0.9 % IV SOLN
200.0000 mg | Freq: Once | INTRAVENOUS | Status: AC
  Administered 2022-09-02: 200 mg via INTRAVENOUS
  Filled 2022-09-02: qty 200

## 2022-09-02 NOTE — Patient Instructions (Signed)

## 2022-09-03 ENCOUNTER — Encounter: Payer: Self-pay | Admitting: Family Medicine

## 2022-09-03 ENCOUNTER — Encounter: Payer: Self-pay | Admitting: Oncology

## 2022-09-03 ENCOUNTER — Ambulatory Visit (INDEPENDENT_AMBULATORY_CARE_PROVIDER_SITE_OTHER): Payer: 59 | Admitting: Family Medicine

## 2022-09-03 VITALS — BP 124/70 | HR 101 | Temp 96.3°F | Ht 66.0 in | Wt 167.0 lb

## 2022-09-03 DIAGNOSIS — R0602 Shortness of breath: Secondary | ICD-10-CM

## 2022-09-03 DIAGNOSIS — R052 Subacute cough: Secondary | ICD-10-CM | POA: Diagnosis not present

## 2022-09-03 DIAGNOSIS — D72828 Other elevated white blood cell count: Secondary | ICD-10-CM | POA: Diagnosis not present

## 2022-09-03 DIAGNOSIS — J209 Acute bronchitis, unspecified: Secondary | ICD-10-CM | POA: Insufficient documentation

## 2022-09-03 LAB — CBC WITH DIFFERENTIAL/PLATELET
Basophils Absolute: 0.1 10*3/uL (ref 0.0–0.2)
Basos: 1 %
EOS (ABSOLUTE): 0.4 10*3/uL (ref 0.0–0.4)
Eos: 3 %
Hematocrit: 27.4 % — ABNORMAL LOW (ref 34.0–46.6)
Hemoglobin: 8.9 g/dL — ABNORMAL LOW (ref 11.1–15.9)
Immature Grans (Abs): 0.1 10*3/uL (ref 0.0–0.1)
Immature Granulocytes: 1 %
Lymphocytes Absolute: 1.9 10*3/uL (ref 0.7–3.1)
Lymphs: 14 %
MCH: 28.9 pg (ref 26.6–33.0)
MCHC: 32.5 g/dL (ref 31.5–35.7)
MCV: 89 fL (ref 79–97)
Monocytes Absolute: 1 10*3/uL — ABNORMAL HIGH (ref 0.1–0.9)
Monocytes: 7 %
Neutrophils Absolute: 10.2 10*3/uL — ABNORMAL HIGH (ref 1.4–7.0)
Neutrophils: 74 %
Platelets: 364 10*3/uL (ref 150–450)
RBC: 3.08 x10E6/uL — ABNORMAL LOW (ref 3.77–5.28)
RDW: 14.4 % (ref 11.7–15.4)
WBC: 13.6 10*3/uL — ABNORMAL HIGH (ref 3.4–10.8)

## 2022-09-03 MED ORDER — AMOXICILLIN-POT CLAVULANATE 875-125 MG PO TABS: 1.0000 | ORAL_TABLET | Freq: Two times a day (BID) | ORAL | 0 refills | Status: DC

## 2022-09-03 MED ORDER — IPRATROPIUM-ALBUTEROL 0.5-2.5 (3) MG/3ML IN SOLN: 3.0000 mL | Freq: Once | RESPIRATORY_TRACT | Status: DC

## 2022-09-03 MED ORDER — PREDNISONE 10 MG PO TABS: ORAL_TABLET | ORAL | 0 refills | Status: AC

## 2022-09-03 MED ORDER — BUDESONIDE-FORMOTEROL FUMARATE 160-4.5 MCG/ACT IN AERO: 2.0000 | INHALATION_SPRAY | Freq: Two times a day (BID) | RESPIRATORY_TRACT | 2 refills | Status: DC

## 2022-09-03 MED FILL — Iron Sucrose Inj 20 MG/ML (Fe Equiv): INTRAVENOUS | Qty: 10 | Status: AC

## 2022-09-03 NOTE — Assessment & Plan Note (Signed)
Recheck cbc.  

## 2022-09-03 NOTE — Assessment & Plan Note (Signed)
Symbicort, prednisone. Augmentin sent.   Keep appt in one week.

## 2022-09-03 NOTE — Assessment & Plan Note (Signed)
Concerning for bronchitis vs possible asthma. Start on symbicort 2 puffs twice daily.

## 2022-09-03 NOTE — Progress Notes (Signed)
Acute Office Visit  Subjective:    Patient ID: Molly Parrish, female    DOB: 27-Nov-1965, 57 y.o.   MRN: [id number]  Chief Complaint  Patient presents with   Follow-up    HPI Patient was seen at the urgent care 2 weeks ago and diagnosed with URI and given doxycycline and tessalon perles. Has not improved. Patient is in today for chest xray results and wbc count. Still coughing both dry and productive (greenish-yellow). Robitussin helped some, tessalon perles do not. Cough is worse at night  negative for covid, flu and strep at the urgent care. Completed doxy.   Patient Saw Dr. Bobby Rumpf, who did labs. Her wbc was 20K last week. She had not had any prednisone.  She is scheduled to get iron infusions.   Past Medical History:  Diagnosis Date   Alcohol dependence in remission ([place]) [id number]   Alcoholic cirrhosis of liver (HCC)    Anemia    Anemia of chronic disease 08/08/2016   Arterial hypotension 05/08/2020   Bipolar disorder current episode depressed ([place])    Brain TIA 02/19/2020   CKD (chronic kidney disease)    Episodic mood disorder (HCC) 11/07/2013   Generalized anxiety disorder    Generalized weakness 07/11/2020   GERD (gastroesophageal reflux disease)    H/O bariatric surgery 06/11/2020   Hypocalcemia 12/10/2015   Hypokalemia    Hyponatremia 02/19/2020   Intestinal malabsorption    Iron deficiency anemia due to chronic blood loss 05/08/2020   Korsakoff syndrome ([place])    Lumbar disc disease 10/19/2019   Microscopic hematuria 04/16/2021   Migraine 04/20/2014   Mild recurrent major depression ([place]) 04/19/2020   Multiple closed fractures of ribs of left side 06/03/2020   Formatting of this note might be different from the original. Added automatically from request for surgery [id number]   Neuropathy 01/31/2020   Obsessive-compulsive disorder 11/07/2013   Other osteoporosis without current pathological fracture    Peripheral edema 11/01/2020   Polypharmacy 06/12/2020   Portal  hypertension ([place])    Post-surgical hypothyroidism 12/10/2015   Primary insomnia 03/26/2021   Severe protein-calorie malnutrition ([place]) 03/26/2021   Shortness of breath 11/01/2020   Stage 3b chronic kidney disease ([place]) 04/16/2021   Stroke ([place])     left basal ganglia stroke (hemorrhagic)   Telogen effluvium 03/26/2021   Ulcer of left foot with fat layer exposed ([place]) 10/04/2020   Ulcer of right foot, with fat layer exposed ([place]) 10/10/2020   Vitamin D deficiency     Past Surgical History:  Procedure Laterality Date   BREAST EXCISIONAL BIOPSY Left    CATARACT EXTRACTION     GASTRIC BYPASS  2005   HEMORRHOID SURGERY     HERNIA REPAIR     metatarsus abductus surgery Left 1990   PARTIAL HYSTERECTOMY  05/1997   BL Ovaries remain. Done for fibroids.   THYROIDECTOMY  03/2013    Family History  Problem Relation Age of Onset   Breast cancer Mother    Osteoporosis Mother    Cancer Mother        Breast, lung, skin.    Breast cancer Maternal Grandmother     Social History   Socioeconomic History   Marital status: Married    Spouse name: Not on file   Number of children: 1   Years of education: Not on file   Highest education level: Not on file  Occupational History   Occupation: Engineer, maintenance (IT)    Comment: Retired.  Tobacco Use  Smoking status: Never   Smokeless tobacco: Never  Substance and Sexual Activity   Alcohol use: Not Currently    Comment: history of alcoholism. sober since 2014.   Drug use: Never   Sexual activity: Yes  Other Topics Concern   Not on file  Social History Narrative   Right handed   Social Determinants of Health   Financial Resource Strain: Low Risk  (05/20/2021)   Overall Financial Resource Strain (CARDIA)    Difficulty of Paying Living Expenses: Not hard at all  Food Insecurity: No Food Insecurity (05/20/2021)   Hunger Vital Sign    Worried About Running Out of Food in the Last Year: Never true    Ran Out of Food in the Last Year: Never true  Transportation  Needs: No Transportation Needs (05/20/2021)   PRAPARE - Hydrologist (Medical): No    Lack of Transportation (Non-Medical): No  Physical Activity: Insufficiently Active (09/03/2022)   Exercise Vital Sign    Days of Exercise per Week: 5 days    Minutes of Exercise per Session: 20 min  Stress: No Stress Concern Present (09/03/2022)   [place]    Feeling of Stress : Not at all  Social Connections: Moderately Isolated (09/03/2022)   Social Connection and Isolation Panel [NHANES]    Frequency of Communication with Friends and Family: Three times a week    Frequency of Social Gatherings with Friends and Family: Twice a week    Attends Religious Services: Never    Marine scientist or Organizations: No    Attends Archivist Meetings: Never    Marital Status: Married  Human resources officer Violence: Not At Risk (05/20/2021)   Humiliation, Afraid, Rape, and Kick questionnaire    Fear of Current or Ex-Partner: No    Emotionally Abused: No    Physically Abused: No    Sexually Abused: No    Outpatient Medications Prior to Visit  Medication Sig Dispense Refill   ALPRAZolam (XANAX) 0.25 MG tablet TAKE 1 TABLET(0.25 MG) BY MOUTH TWICE DAILY 60 tablet 2   calcitRIOL (ROCALTROL) 0.5 MCG capsule Take 0.5 mcg by mouth 2 (two) times daily.     calcium carbonate (OSCAL) 1500 (600 Ca) MG TABS tablet Take 3,600 mg by mouth 2 (two) times daily with a meal.     Cyanocobalamin (VITAMIN B 12 PO) Take 1,000 mcg by mouth daily.     cyclobenzaprine (FLEXERIL) 10 MG tablet TAKE 1 TABLET(10 MG) BY MOUTH THREE TIMES DAILY AS NEEDED FOR MUSCLE SPASMS 90 tablet 2   famotidine (PEPCID) 40 MG tablet Take 40 mg by mouth at bedtime.     furosemide (LASIX) 40 MG tablet Take 2 tablets (80 mg total) by mouth 3 (three) times daily between meals. 540 tablet 0   gabapentin (NEURONTIN) 100 MG capsule TAKE 1 CAPSULE(100 MG) BY  MOUTH THREE TIMES DAILY 90 capsule 1   Glucagon (GVOKE HYPOPEN 2-PACK) 0.5 MG/0.1ML SOAJ If glucose less than 60, use gvoke pen x 1. May repeat if not responding x 1 daily. Call EMS if not improving. 0.2 mL 2   Iron-Vitamins (GERITOL COMPLETE) TABS Take 1 tablet by mouth daily.     lamoTRIgine (LAMICTAL) 200 MG tablet Take 200 mg by mouth at bedtime.     levothyroxine (SYNTHROID) 175 MCG tablet Take 1 tablet (175 mcg total) by mouth daily before breakfast. 90 tablet 1   lubiprostone (AMITIZA) 24 MCG  capsule TAKE 1 CAPSULE(24 MCG) BY MOUTH TWICE DAILY WITH A MEAL 60 capsule 5   magnesium oxide (MAG-OX) 400 MG tablet Take 400 mg by mouth daily.     ondansetron (ZOFRAN) 4 MG tablet TAKE 1 TABLET(4 MG) BY MOUTH EVERY 8 HOURS AS NEEDED FOR NAUSEA OR VOMITING 60 tablet 0   pantoprazole (PROTONIX) 40 MG tablet TAKE 1 TABLET(40 MG) BY MOUTH DAILY 90 tablet 0   promethazine (PHENERGAN) 25 MG tablet TAKE 1 TABLET BY MOUTH FOUR TIMES DAILY AS NEEDED FOR NAUSEA OR VOMITING 40 tablet 2   propranolol (INDERAL) 10 MG tablet Take 10 mg by mouth at bedtime.     QUEtiapine (SEROQUEL) 50 MG tablet Take 100 mg by mouth at bedtime.     rifaximin (XIFAXAN) 550 MG TABS tablet Take 550 mg by mouth 2 (two) times daily.     sertraline (ZOLOFT) 100 MG tablet Take 100 mg by mouth in the morning and at bedtime.     sevelamer carbonate (RENVELA) 800 MG tablet Take 800 mg by mouth 3 (three) times daily with meals.     spironolactone (ALDACTONE) 50 MG tablet Take 100 mg by mouth 2 (two) times daily.     thiamine 100 MG tablet Take 100 mg by mouth daily.     triamcinolone (KENALOG) 0.1 % paste Use as directed 1 Application in the mouth or throat 2 (two) times daily. 5 g 2   Vitamin D, Ergocalciferol, (DRISDOL) 1.25 MG (50000 UNIT) CAPS capsule Take 1 capsule (50,000 Units total) by mouth 2 (two) times a week. 24 capsule 0   zolpidem (AMBIEN) 10 MG tablet Take 1 tablet (10 mg total) by mouth at bedtime as needed for sleep. 30  tablet 1   amoxicillin-clavulanate (AUGMENTIN) 875-125 MG tablet Take 1 tablet by mouth 2 (two) times daily. 20 tablet 0   hydrOXYzine (VISTARIL) 25 MG capsule TAKE 1 CAPSULE(25 MG) BY MOUTH THREE TIMES DAILY 90 capsule 2   ramelteon (ROZEREM) 8 MG tablet Take 1 tablet (8 mg total) by mouth at bedtime. 30 tablet 0   traMADol (ULTRAM) 50 MG tablet TAKE 2 TABLETS BY MOUTH TWICE DAILY FOR BACK PAIN 120 tablet 0   Vitamin D, Ergocalciferol, (DRISDOL) 1.25 MG (50000 UNIT) CAPS capsule Take 1 capsule (50,000 Units total) by mouth 2 (two) times a week. 24 capsule 0   zaleplon (SONATA) 5 MG capsule Take 1 capsule (5 mg total) by mouth at bedtime. 30 capsule 2   No facility-administered medications prior to visit.    Allergies  Allergen Reactions   Cholestyramine     Rash and itching   Trintellix [Vortioxetine]     Review of Systems  Constitutional:  Positive for fatigue. Negative for appetite change and fever.  HENT:  Positive for congestion. Negative for ear pain, sinus pressure and sore throat.   Respiratory:  Positive for cough. Negative for chest tightness, shortness of breath and wheezing.   Cardiovascular:  Negative for chest pain and palpitations.  Gastrointestinal:  Negative for abdominal pain, constipation, diarrhea, nausea and vomiting.  Genitourinary:  Negative for dysuria and hematuria.  Musculoskeletal:  Positive for back pain. Negative for arthralgias, joint swelling and myalgias.  Skin:  Negative for rash.  Neurological:  Negative for dizziness, weakness and headaches.  Psychiatric/Behavioral:  Negative for dysphoric mood. The patient is not nervous/anxious.        Objective:    Physical Exam Vitals reviewed.  Constitutional:      Appearance: Normal appearance.  Neck:     Vascular: No carotid bruit.  Cardiovascular:     Rate and Rhythm: Normal rate and regular rhythm.     Heart sounds: Normal heart sounds. No murmur heard. Pulmonary:     Effort: Respiratory distress  present.     Breath sounds: Wheezing present.  Abdominal:     General: Abdomen is flat. Bowel sounds are normal.     Palpations: Abdomen is soft.     Tenderness: There is no abdominal tenderness.  Neurological:     Mental Status: She is alert and oriented to person, place, and time.  Psychiatric:        Mood and Affect: Mood normal.        Behavior: Behavior normal.   Given Duoneb treatment: Subjective improvement. Also had improved air exchange.  BP 124/70   Pulse (!) 101   Temp (!) 96.3 F (35.7 C)   Ht 5' 6"$  (1.676 m)   Wt 167 lb (75.8 kg)   SpO2 96%   BMI 26.95 kg/m  Wt Readings from Last 3 Encounters:  09/04/22 165 lb (74.8 kg)  09/03/22 167 lb (75.8 kg)  08/28/22 167 lb 3.2 oz (75.8 kg)    Health Maintenance Due  Topic Date Due   Medicare Annual Wellness (AWV)  Never done   Zoster Vaccines- Shingrix (2 of 2) 10/21/2020   COVID-19 Vaccine (6 - 2023-24 season) 08/10/2022    There are no preventive care reminders to display for this patient.   Lab Results  Component Value Date   TSH 1.050 06/15/2022   Lab Results  Component Value Date   WBC 13.6 (H) 09/03/2022   HGB 8.9 (L) 09/03/2022   HCT 27.4 (L) 09/03/2022   MCV 89 09/03/2022   PLT 364 09/03/2022   Lab Results  Component Value Date   NA 134 (L) 08/28/2022   K 4.2 08/28/2022   CO2 25 08/28/2022   GLUCOSE 116 (H) 08/28/2022   BUN 36 (H) 08/28/2022   CREATININE 1.37 (H) 08/28/2022   BILITOT 0.5 08/28/2022   ALKPHOS 123 08/28/2022   AST 26 08/28/2022   ALT 20 08/28/2022   PROT 7.1 08/28/2022   ALBUMIN 3.3 (L) 08/28/2022   CALCIUM 8.2 (L) 08/28/2022   ANIONGAP 11 08/28/2022   EGFR 59 (L) 07/23/2022   Lab Results  Component Value Date   CHOL 141 10/22/2021   Lab Results  Component Value Date   HDL 71 10/22/2021   Lab Results  Component Value Date   LDLCALC 59 10/22/2021   Lab Results  Component Value Date   TRIG 51 10/22/2021   Lab Results  Component Value Date   CHOLHDL 2.0  10/22/2021   No results found for: "HGBA1C"       Assessment & Plan:   Problem List Items Addressed This Visit       Respiratory   Subacute bronchitis    Symbicort, prednisone. Augmentin sent.   Keep appt in one week.       Relevant Medications   predniSONE (DELTASONE) 10 MG tablet   budesonide-formoterol (SYMBICORT) 160-4.5 MCG/ACT inhaler   amoxicillin-clavulanate (AUGMENTIN) 875-125 MG tablet     Other   Leucocytosis    Recheck cbc      Relevant Orders   CBC with Differential/Platelet (Completed)   Shortness of breath    Concerning for bronchitis vs possible asthma. Start on symbicort 2 puffs twice daily.        Relevant Medications   ipratropium-albuterol (DUONEB) 0.5-2.5 (  3) MG/3ML nebulizer solution 3 mL   Subacute cough - Primary    Concerning for bronchitis vs possible asthma. Start on symbicort 2 puffs twice daily.          Meds ordered this encounter  Medications   ipratropium-albuterol (DUONEB) 0.5-2.5 (3) MG/3ML nebulizer solution 3 mL   predniSONE (DELTASONE) 10 MG tablet    Sig: Take 6 tablets (60 mg total) by mouth daily with breakfast for 1 day, THEN 5 tablets (50 mg total) daily with breakfast for 1 day, THEN 4 tablets (40 mg total) daily with breakfast for 1 day, THEN 3 tablets (30 mg total) daily with breakfast for 1 day, THEN 2 tablets (20 mg total) daily with breakfast for 1 day, THEN 1 tablet (10 mg total) daily with breakfast for 1 day.    Dispense:  21 tablet    Refill:  0   budesonide-formoterol (SYMBICORT) 160-4.5 MCG/ACT inhaler    Sig: Inhale 2 puffs into the lungs 2 (two) times daily.    Dispense:  10.2 g    Refill:  2   amoxicillin-clavulanate (AUGMENTIN) 875-125 MG tablet    Sig: Take 1 tablet by mouth 2 (two) times daily.    Dispense:  20 tablet    Refill:  0   Follow up in 1 week at chronic visit.  I attest that I have reviewed this visit and agree with the plan scribed by my staff.   Rochel Brome, MD [facility] [phone]

## 2022-09-04 ENCOUNTER — Encounter: Payer: Self-pay | Admitting: Oncology

## 2022-09-04 ENCOUNTER — Inpatient Hospital Stay: Payer: 59

## 2022-09-04 VITALS — BP 94/59 | HR 88 | Temp 98.0°F | Resp 12 | Ht 66.0 in | Wt 165.0 lb

## 2022-09-04 DIAGNOSIS — D500 Iron deficiency anemia secondary to blood loss (chronic): Secondary | ICD-10-CM

## 2022-09-04 DIAGNOSIS — D72829 Elevated white blood cell count, unspecified: Secondary | ICD-10-CM | POA: Diagnosis not present

## 2022-09-04 MED ORDER — SODIUM CHLORIDE 0.9 % IV SOLN: Freq: Once | INTRAVENOUS | Status: AC

## 2022-09-04 MED ORDER — ACETAMINOPHEN 325 MG PO TABS
650.0000 mg | ORAL_TABLET | Freq: Once | ORAL | Status: AC
  Administered 2022-09-04: 650 mg via ORAL
  Filled 2022-09-04: qty 2

## 2022-09-04 MED ORDER — SODIUM CHLORIDE 0.9 % IV SOLN
200.0000 mg | Freq: Once | INTRAVENOUS | Status: AC
  Administered 2022-09-04: 200 mg via INTRAVENOUS
  Filled 2022-09-04: qty 200

## 2022-09-04 MED ORDER — FAMOTIDINE IN NACL 20-0.9 MG/50ML-% IV SOLN
20.0000 mg | Freq: Once | INTRAVENOUS | Status: AC
  Administered 2022-09-04: 20 mg via INTRAVENOUS
  Filled 2022-09-04: qty 50

## 2022-09-04 MED FILL — Iron Sucrose Inj 20 MG/ML (Fe Equiv): INTRAVENOUS | Qty: 10 | Status: AC

## 2022-09-04 NOTE — Patient Instructions (Signed)

## 2022-09-05 ENCOUNTER — Other Ambulatory Visit: Payer: Self-pay | Admitting: Family Medicine

## 2022-09-06 ENCOUNTER — Encounter: Payer: Self-pay | Admitting: Family Medicine

## 2022-09-07 ENCOUNTER — Inpatient Hospital Stay: Payer: 59

## 2022-09-07 VITALS — BP 120/71 | HR 85 | Temp 97.8°F | Resp 16 | Ht 66.0 in | Wt 169.0 lb

## 2022-09-07 DIAGNOSIS — D72829 Elevated white blood cell count, unspecified: Secondary | ICD-10-CM | POA: Diagnosis not present

## 2022-09-07 DIAGNOSIS — D500 Iron deficiency anemia secondary to blood loss (chronic): Secondary | ICD-10-CM

## 2022-09-07 MED ORDER — SODIUM CHLORIDE 0.9 % IV SOLN
200.0000 mg | Freq: Once | INTRAVENOUS | Status: AC
  Administered 2022-09-07: 200 mg via INTRAVENOUS
  Filled 2022-09-07: qty 200

## 2022-09-07 MED ORDER — SODIUM CHLORIDE 0.9 % IV SOLN: Freq: Once | INTRAVENOUS | Status: AC

## 2022-09-07 MED ORDER — FAMOTIDINE IN NACL 20-0.9 MG/50ML-% IV SOLN
20.0000 mg | Freq: Once | INTRAVENOUS | Status: AC
  Administered 2022-09-07: 20 mg via INTRAVENOUS
  Filled 2022-09-07: qty 50

## 2022-09-07 MED ORDER — ACETAMINOPHEN 325 MG PO TABS
650.0000 mg | ORAL_TABLET | Freq: Once | ORAL | Status: AC
  Administered 2022-09-07: 650 mg via ORAL
  Filled 2022-09-07: qty 2

## 2022-09-07 NOTE — Patient Instructions (Signed)

## 2022-09-08 ENCOUNTER — Other Ambulatory Visit: Payer: Self-pay | Admitting: Pharmacist

## 2022-09-08 DIAGNOSIS — M2061 Acquired deformities of toe(s), unspecified, right foot: Secondary | ICD-10-CM | POA: Insufficient documentation

## 2022-09-09 ENCOUNTER — Ambulatory Visit: Payer: 59

## 2022-09-09 MED FILL — Iron Sucrose Inj 20 MG/ML (Fe Equiv): INTRAVENOUS | Qty: 10 | Status: AC

## 2022-09-10 ENCOUNTER — Inpatient Hospital Stay: Payer: 59

## 2022-09-10 ENCOUNTER — Ambulatory Visit: Payer: 59

## 2022-09-10 VITALS — BP 104/75 | HR 77 | Temp 97.7°F | Resp 16 | Wt 169.1 lb

## 2022-09-10 DIAGNOSIS — D500 Iron deficiency anemia secondary to blood loss (chronic): Secondary | ICD-10-CM

## 2022-09-10 DIAGNOSIS — D72829 Elevated white blood cell count, unspecified: Secondary | ICD-10-CM | POA: Diagnosis not present

## 2022-09-10 MED ORDER — SODIUM CHLORIDE 0.9 % IV SOLN
200.0000 mg | Freq: Once | INTRAVENOUS | Status: AC
  Administered 2022-09-10: 200 mg via INTRAVENOUS
  Filled 2022-09-10: qty 200

## 2022-09-10 MED ORDER — ACETAMINOPHEN 325 MG PO TABS
650.0000 mg | ORAL_TABLET | Freq: Once | ORAL | Status: AC
  Administered 2022-09-10: 650 mg via ORAL
  Filled 2022-09-10: qty 2

## 2022-09-10 MED ORDER — SODIUM CHLORIDE 0.9 % IV SOLN: Freq: Once | INTRAVENOUS | Status: AC

## 2022-09-10 MED ORDER — FAMOTIDINE IN NACL 20-0.9 MG/50ML-% IV SOLN
20.0000 mg | Freq: Once | INTRAVENOUS | Status: AC
  Administered 2022-09-10: 20 mg via INTRAVENOUS
  Filled 2022-09-10: qty 50

## 2022-09-10 MED FILL — Iron Sucrose Inj 20 MG/ML (Fe Equiv): INTRAVENOUS | Qty: 10 | Status: AC

## 2022-09-10 NOTE — Patient Instructions (Signed)

## 2022-09-11 ENCOUNTER — Encounter: Payer: Self-pay | Admitting: Family Medicine

## 2022-09-11 ENCOUNTER — Inpatient Hospital Stay: Payer: 59

## 2022-09-11 ENCOUNTER — Ambulatory Visit (INDEPENDENT_AMBULATORY_CARE_PROVIDER_SITE_OTHER): Payer: 59 | Admitting: Family Medicine

## 2022-09-11 VITALS — BP 118/68 | HR 80 | Temp 97.5°F | Ht 66.0 in | Wt 166.0 lb

## 2022-09-11 VITALS — BP 115/59 | HR 84 | Temp 98.1°F | Resp 18 | Ht 66.0 in | Wt 166.1 lb

## 2022-09-11 DIAGNOSIS — J209 Acute bronchitis, unspecified: Secondary | ICD-10-CM

## 2022-09-11 DIAGNOSIS — D638 Anemia in other chronic diseases classified elsewhere: Secondary | ICD-10-CM | POA: Diagnosis not present

## 2022-09-11 DIAGNOSIS — E890 Postprocedural hypothyroidism: Secondary | ICD-10-CM

## 2022-09-11 DIAGNOSIS — E559 Vitamin D deficiency, unspecified: Secondary | ICD-10-CM

## 2022-09-11 DIAGNOSIS — D72829 Elevated white blood cell count, unspecified: Secondary | ICD-10-CM | POA: Diagnosis not present

## 2022-09-11 DIAGNOSIS — D500 Iron deficiency anemia secondary to blood loss (chronic): Secondary | ICD-10-CM

## 2022-09-11 MED ORDER — SODIUM CHLORIDE 0.9 % IV SOLN: Freq: Once | INTRAVENOUS | Status: AC

## 2022-09-11 MED ORDER — ACETAMINOPHEN 325 MG PO TABS
650.0000 mg | ORAL_TABLET | Freq: Once | ORAL | Status: AC
  Administered 2022-09-11: 650 mg via ORAL
  Filled 2022-09-11: qty 2

## 2022-09-11 MED ORDER — SODIUM CHLORIDE 0.9 % IV SOLN
200.0000 mg | Freq: Once | INTRAVENOUS | Status: AC
  Administered 2022-09-11: 200 mg via INTRAVENOUS
  Filled 2022-09-11: qty 200

## 2022-09-11 MED ORDER — FAMOTIDINE IN NACL 20-0.9 MG/50ML-% IV SOLN
20.0000 mg | Freq: Once | INTRAVENOUS | Status: AC
  Administered 2022-09-11: 20 mg via INTRAVENOUS
  Filled 2022-09-11: qty 50

## 2022-09-11 NOTE — Patient Instructions (Signed)

## 2022-09-11 NOTE — Progress Notes (Unsigned)
Subjective:  Patient ID: Molly Parrish, female    DOB: 28-Feb-1966  Age: 57 y.o. MRN: [id number]  Chief Complaint  Patient presents with   1 week follow up    HPI Patient presents for 1 week follow up after being sick with bronchitis. Patient still feels a little winded in other words shortness of breath from time to time but does feel improvement. Patient will be receiving her last iron infusion today.      06/15/2022   10:02 AM 12/05/2021   10:55 AM 11/14/2021   11:16 AM 10/22/2021   10:40 AM 07/25/2021   11:10 AM  Depression screen PHQ 2/9  Decreased Interest 0 0 0 1 1  Down, Depressed, Hopeless 0 0 0 1 0  PHQ - 2 Score 0 0 0 2 1  Altered sleeping 3   3   Tired, decreased energy 3   3   Change in appetite 0   0   Feeling bad or failure about yourself  0   0   Trouble concentrating 0   1   Moving slowly or fidgety/restless 1   0   Suicidal thoughts 0   0   PHQ-9 Score 7   9   Difficult doing work/chores Somewhat difficult   Somewhat difficult          11/25/2021    3:40 PM 11/26/2021   11:30 AM 12/05/2021   10:55 AM 02/26/2022   10:04 AM 06/15/2022   10:05 AM  Fall Risk  Falls in the past year? 0  1  0  Was there an injury with Fall?   1  0  Fall Risk Category Calculator   3  1  Fall Risk Category (Retired)   High  Low  (RETIRED) Patient Fall Risk Level  Low fall risk  Moderate fall risk High fall risk  Patient at Risk for Falls Due to   History of fall(s)  History of fall(s)  Fall risk Follow up   Falls evaluation completed  Falls evaluation completed      Review of Systems  Constitutional:  Negative for appetite change, fatigue and fever.  HENT:  Positive for congestion (little congestion with improvement). Negative for ear pain, sinus pressure and sore throat.   Respiratory:  Positive for shortness of breath (still a little winded but has improved.). Negative for cough, chest tightness and wheezing.   Cardiovascular:  Negative for chest pain and  palpitations.  Gastrointestinal:  Negative for abdominal pain, constipation, diarrhea, nausea and vomiting.  Genitourinary:  Negative for dysuria and hematuria.  Musculoskeletal:  Negative for arthralgias, back pain, joint swelling and myalgias.  Skin:  Negative for rash.  Neurological:  Negative for dizziness, weakness and headaches.  Psychiatric/Behavioral:  Negative for dysphoric mood. The patient is not nervous/anxious.     Current Outpatient Medications on File Prior to Visit  Medication Sig Dispense Refill   acetaminophen (TYLENOL) 325 MG tablet Take by mouth.     ALPRAZolam (XANAX) 0.25 MG tablet TAKE 1 TABLET(0.25 MG) BY MOUTH TWICE DAILY 60 tablet 2   benzonatate (TESSALON) 200 MG capsule Take by mouth.     budesonide-formoterol (SYMBICORT) 160-4.5 MCG/ACT inhaler Inhale 2 puffs into the lungs 2 (two) times daily. 10.2 g 2   calcitRIOL (ROCALTROL) 0.5 MCG capsule Take 0.5 mcg by mouth 2 (two) times daily.     calcium carbonate (OSCAL) 1500 (600 Ca) MG TABS tablet Take 3,600 mg by mouth 2 (two) times daily with  a meal.     Cyanocobalamin (VITAMIN B 12 PO) Take 1,000 mcg by mouth daily.     cyclobenzaprine (FLEXERIL) 10 MG tablet TAKE 1 TABLET(10 MG) BY MOUTH THREE TIMES DAILY AS NEEDED FOR MUSCLE SPASMS 90 tablet 2   famotidine (PEPCID) 40 MG tablet Take 40 mg by mouth at bedtime.     furosemide (LASIX) 40 MG tablet Take 2 tablets (80 mg total) by mouth 3 (three) times daily between meals. 540 tablet 0   gabapentin (NEURONTIN) 100 MG capsule TAKE 1 CAPSULE(100 MG) BY MOUTH THREE TIMES DAILY 90 capsule 1   Glucagon (GVOKE HYPOPEN 2-PACK) 0.5 MG/0.1ML SOAJ If glucose less than 60, use gvoke pen x 1. May repeat if not responding x 1 daily. Call EMS if not improving. 0.2 mL 2   hydrOXYzine (VISTARIL) 25 MG capsule TAKE 1 CAPSULE(25 MG) BY MOUTH THREE TIMES DAILY 90 capsule 2   Iron-Vitamins (GERITOL COMPLETE) TABS Take 1 tablet by mouth daily.     lamoTRIgine (LAMICTAL) 200 MG tablet  Take 200 mg by mouth at bedtime.     levothyroxine (SYNTHROID) 175 MCG tablet Take 1 tablet (175 mcg total) by mouth daily before breakfast. 90 tablet 1   lubiprostone (AMITIZA) 24 MCG capsule TAKE 1 CAPSULE(24 MCG) BY MOUTH TWICE DAILY WITH A MEAL 60 capsule 5   magnesium oxide (MAG-OX) 400 MG tablet Take 400 mg by mouth daily.     ondansetron (ZOFRAN) 4 MG tablet TAKE 1 TABLET(4 MG) BY MOUTH EVERY 8 HOURS AS NEEDED FOR NAUSEA OR VOMITING 60 tablet 0   pantoprazole (PROTONIX) 40 MG tablet TAKE 1 TABLET(40 MG) BY MOUTH DAILY 90 tablet 0   phentermine (ADIPEX-P) 37.5 MG tablet Take 37.5 mg by mouth every morning.     promethazine (PHENERGAN) 25 MG tablet TAKE 1 TABLET BY MOUTH FOUR TIMES DAILY AS NEEDED FOR NAUSEA OR VOMITING 40 tablet 2   propranolol (INDERAL) 10 MG tablet Take 10 mg by mouth at bedtime.     QUEtiapine (SEROQUEL) 50 MG tablet Take 100 mg by mouth at bedtime.     rifaximin (XIFAXAN) 550 MG TABS tablet Take 550 mg by mouth 2 (two) times daily.     sertraline (ZOLOFT) 100 MG tablet Take 100 mg by mouth in the morning and at bedtime.     sevelamer carbonate (RENVELA) 800 MG tablet Take 800 mg by mouth 3 (three) times daily with meals.     spironolactone (ALDACTONE) 50 MG tablet Take 100 mg by mouth 2 (two) times daily.     thiamine 100 MG tablet Take 100 mg by mouth daily.     triamcinolone (KENALOG) 0.1 % paste Use as directed 1 Application in the mouth or throat 2 (two) times daily. 5 g 2   Vitamin D, Ergocalciferol, (DRISDOL) 1.25 MG (50000 UNIT) CAPS capsule Take 1 capsule (50,000 Units total) by mouth 2 (two) times a week. 24 capsule 0   zolpidem (AMBIEN) 10 MG tablet Take 1 tablet (10 mg total) by mouth at bedtime as needed for sleep. 30 tablet 1   Current Facility-Administered Medications on File Prior to Visit  Medication Dose Route Frequency Provider Last Rate Last Admin   ipratropium-albuterol (DUONEB) 0.5-2.5 (3) MG/3ML nebulizer solution 3 mL  3 mL Nebulization Once Rochel Brome, MD       Past Medical History:  Diagnosis Date   Alcohol dependence in remission ([place]) [id number]   Alcoholic cirrhosis of liver (HCC)    Anemia  Anemia of chronic disease 08/08/2016   Arterial hypotension 05/08/2020   Bipolar disorder current episode depressed ([place])    Brain TIA 02/19/2020   CKD (chronic kidney disease)    Episodic mood disorder (HCC) 11/07/2013   Generalized anxiety disorder    Generalized weakness 07/11/2020   GERD (gastroesophageal reflux disease)    H/O bariatric surgery 06/11/2020   Hypocalcemia 12/10/2015   Hypokalemia    Hyponatremia 02/19/2020   Intestinal malabsorption    Iron deficiency anemia due to chronic blood loss 05/08/2020   Korsakoff syndrome ([place])    Lumbar disc disease 10/19/2019   Microscopic hematuria 04/16/2021   Migraine 04/20/2014   Mild recurrent major depression ([place]) 04/19/2020   Multiple closed fractures of ribs of left side 06/03/2020   Formatting of this note might be different from the original. Added automatically from request for surgery [id number]   Neuropathy 01/31/2020   Obsessive-compulsive disorder 11/07/2013   Other osteoporosis without current pathological fracture    Peripheral edema 11/01/2020   Polypharmacy 06/12/2020   Portal hypertension ([place])    Post-surgical hypothyroidism 12/10/2015   Primary insomnia 03/26/2021   Severe protein-calorie malnutrition ([place]) 03/26/2021   Shortness of breath 11/01/2020   Stage 3b chronic kidney disease ([place]) 04/16/2021   Stroke ([place])     left basal ganglia stroke (hemorrhagic)   Telogen effluvium 03/26/2021   Ulcer of left foot with fat layer exposed ([place]) 10/04/2020   Ulcer of right foot, with fat layer exposed ([place]) 10/10/2020   Vitamin D deficiency    Past Surgical History:  Procedure Laterality Date   BREAST EXCISIONAL BIOPSY Left    CATARACT EXTRACTION     GASTRIC BYPASS  2005   HEMORRHOID SURGERY     HERNIA REPAIR     metatarsus abductus surgery Left 1990   PARTIAL HYSTERECTOMY   05/1997   BL Ovaries remain. Done for fibroids.   THYROIDECTOMY  03/2013    Family History  Problem Relation Age of Onset   Breast cancer Mother    Osteoporosis Mother    Cancer Mother        Breast, lung, skin.    Breast cancer Maternal Grandmother    Social History   Socioeconomic History   Marital status: Married    Spouse name: Not on file   Number of children: 1   Years of education: Not on file   Highest education level: Not on file  Occupational History   Occupation: Engineer, maintenance (IT)    Comment: Retired.  Tobacco Use   Smoking status: Never   Smokeless tobacco: Never  Substance and Sexual Activity   Alcohol use: Not Currently    Comment: history of alcoholism. sober since 2014.   Drug use: Never   Sexual activity: Yes  Other Topics Concern   Not on file  Social History Narrative   Right handed   Social Determinants of Health   Financial Resource Strain: Low Risk  (05/20/2021)   Overall Financial Resource Strain (CARDIA)    Difficulty of Paying Living Expenses: Not hard at all  Food Insecurity: No Food Insecurity (05/20/2021)   Hunger Vital Sign    Worried About Running Out of Food in the Last Year: Never true    Ran Out of Food in the Last Year: Never true  Transportation Needs: No Transportation Needs (05/20/2021)   PRAPARE - Hydrologist (Medical): No    Lack of Transportation (Non-Medical): No  Physical Activity: Insufficiently Active (09/03/2022)   Exercise  Vital Sign    Days of Exercise per Week: 5 days    Minutes of Exercise per Session: 20 min  Stress: No Stress Concern Present (09/03/2022)   [place]    Feeling of Stress : Not at all  Social Connections: Moderately Isolated (09/03/2022)   Social Connection and Isolation Panel [NHANES]    Frequency of Communication with Friends and Family: Three times a week    Frequency of Social Gatherings with Friends and Family:  Twice a week    Attends Religious Services: Never    Marine scientist or Organizations: No    Attends Music therapist: Never    Marital Status: Married    Objective:  BP 118/68 (BP Location: Left Arm, Patient Position: Sitting)   Pulse 80   Temp (!) 97.5 F (36.4 C) (Temporal)   Ht '5\' 6"'$  (1.676 m)   Wt 166 lb (75.3 kg)   SpO2 97%   BMI 26.79 kg/m      09/11/2022   11:10 AM 09/10/2022   10:52 AM 09/10/2022    9:43 AM  BP/Weight  Systolic BP 123456 123456 123456  Diastolic BP 68 75 46  Wt. (Lbs) 166  169.12  BMI 26.79 kg/m2  27.3 kg/m2    Physical Exam Vitals reviewed.  Constitutional:      Appearance: Normal appearance. She is normal weight.  HENT:     Right Ear: Tympanic membrane, ear canal and external ear normal.     Left Ear: Tympanic membrane, ear canal and external ear normal.     Nose: Rhinorrhea present.     Mouth/Throat:     Pharynx: No posterior oropharyngeal erythema.  Cardiovascular:     Rate and Rhythm: Normal rate and regular rhythm.     Heart sounds: Murmur heard.  Pulmonary:     Effort: Pulmonary effort is normal.     Breath sounds: Normal breath sounds.  Abdominal:     General: Abdomen is flat. Bowel sounds are normal.     Palpations: Abdomen is soft.  Neurological:     Mental Status: She is alert and oriented to person, place, and time.  Psychiatric:        Mood and Affect: Mood normal.        Behavior: Behavior normal.     Diabetic Foot Exam - Simple   No data filed      Lab Results  Component Value Date   WBC 13.6 (H) 09/03/2022   HGB 8.9 (L) 09/03/2022   HCT 27.4 (L) 09/03/2022   PLT 364 09/03/2022   GLUCOSE 116 (H) 08/28/2022   CHOL 141 10/22/2021   TRIG 51 10/22/2021   HDL 71 10/22/2021   LDLCALC 59 10/22/2021   ALT 20 08/28/2022   AST 26 08/28/2022   NA 134 (L) 08/28/2022   K 4.2 08/28/2022   CL 98 08/28/2022   CREATININE 1.37 (H) 08/28/2022   BUN 36 (H) 08/28/2022   CO2 25 08/28/2022   TSH 1.050  06/15/2022      Assessment & Plan:    Subacute bronchitis  Vitamin D deficiency -     VITAMIN D 25 Hydroxy (Vit-D Deficiency, Fractures); Future  Anemia of chronic disease -     CBC with Differential/Platelet; Future  Post-surgical hypothyroidism -     TSH; Future  Hypocalcemia -     Comprehensive metabolic panel; Future  Hypomagnesemia -     Phosphorus; Future -  Magnesium; Future     No orders of the defined types were placed in this encounter.   Orders Placed This Encounter  Procedures   CBC with Differential/Platelet   Comprehensive metabolic panel   TSH   VITAMIN D 25 Hydroxy (Vit-D Deficiency, Fractures)   Phosphorus   Magnesium     Follow-up: No follow-ups on file.   I,Lauren M Auman,acting as a scribe for Rochel Brome, MD.,have documented all relevant documentation on the behalf of Rochel Brome, MD,as directed by  Rochel Brome, MD while in the presence of Rochel Brome, MD.   An After Visit Summary was printed and given to the patient.  Rochel Brome, MD [facility] [phone]

## 2022-09-13 NOTE — Assessment & Plan Note (Signed)
Previously well controlled Continue Synthroid at current dose  Recheck TSH and adjust Synthroid as indicated   

## 2022-09-13 NOTE — Assessment & Plan Note (Signed)
Improving.

## 2022-09-13 NOTE — Assessment & Plan Note (Signed)
Check vitamin D. 

## 2022-09-13 NOTE — Assessment & Plan Note (Signed)
Check magnesium.

## 2022-09-13 NOTE — Assessment & Plan Note (Signed)
Multifactorial. Follow up with hematology for iron infusion.

## 2022-09-13 NOTE — Assessment & Plan Note (Signed)
Check cmp 

## 2022-09-16 ENCOUNTER — Other Ambulatory Visit: Payer: Self-pay

## 2022-09-16 ENCOUNTER — Encounter: Payer: Self-pay | Admitting: Family Medicine

## 2022-09-18 ENCOUNTER — Other Ambulatory Visit: Payer: Self-pay

## 2022-09-18 ENCOUNTER — Other Ambulatory Visit (INDEPENDENT_AMBULATORY_CARE_PROVIDER_SITE_OTHER): Payer: 59

## 2022-09-18 ENCOUNTER — Other Ambulatory Visit: Payer: Self-pay | Admitting: Family Medicine

## 2022-09-18 DIAGNOSIS — E559 Vitamin D deficiency, unspecified: Secondary | ICD-10-CM

## 2022-09-18 DIAGNOSIS — N3000 Acute cystitis without hematuria: Secondary | ICD-10-CM

## 2022-09-18 DIAGNOSIS — E890 Postprocedural hypothyroidism: Secondary | ICD-10-CM

## 2022-09-18 DIAGNOSIS — D638 Anemia in other chronic diseases classified elsewhere: Secondary | ICD-10-CM

## 2022-09-18 LAB — POCT URINALYSIS DIP (CLINITEK)
Bilirubin, UA: NEGATIVE
Blood, UA: NEGATIVE
Glucose, UA: NEGATIVE mg/dL
Ketones, POC UA: NEGATIVE mg/dL
Nitrite, UA: POSITIVE — AB
POC PROTEIN,UA: NEGATIVE
Spec Grav, UA: 1.01 (ref 1.010–1.025)
Urobilinogen, UA: 1 E.U./dL
pH, UA: 6 (ref 5.0–8.0)

## 2022-09-18 MED ORDER — NITROFURANTOIN MONOHYD MACRO 100 MG PO CAPS: 100.0000 mg | ORAL_CAPSULE | Freq: Two times a day (BID) | ORAL | 0 refills | Status: DC

## 2022-09-19 LAB — CBC WITH DIFFERENTIAL/PLATELET
Basophils Absolute: 0.1 10*3/uL (ref 0.0–0.2)
Basos: 1 %
EOS (ABSOLUTE): 0.4 10*3/uL (ref 0.0–0.4)
Eos: 3 %
Hematocrit: 38.3 % (ref 34.0–46.6)
Hemoglobin: 11.9 g/dL (ref 11.1–15.9)
Immature Grans (Abs): 0 10*3/uL (ref 0.0–0.1)
Immature Granulocytes: 0 %
Lymphocytes Absolute: 1.5 10*3/uL (ref 0.7–3.1)
Lymphs: 12 %
MCH: 27.7 pg (ref 26.6–33.0)
MCHC: 31.1 g/dL — ABNORMAL LOW (ref 31.5–35.7)
MCV: 89 fL (ref 79–97)
Monocytes Absolute: 0.7 10*3/uL (ref 0.1–0.9)
Monocytes: 6 %
Neutrophils Absolute: 10 10*3/uL — ABNORMAL HIGH (ref 1.4–7.0)
Neutrophils: 78 %
Platelets: 426 10*3/uL (ref 150–450)
RBC: 4.3 x10E6/uL (ref 3.77–5.28)
RDW: 14.6 % (ref 11.7–15.4)
WBC: 12.8 10*3/uL — ABNORMAL HIGH (ref 3.4–10.8)

## 2022-09-19 LAB — COMPREHENSIVE METABOLIC PANEL
ALT: 27 IU/L (ref 0–32)
AST: 35 IU/L (ref 0–40)
Albumin/Globulin Ratio: 1.5 (ref 1.2–2.2)
Albumin: 4.1 g/dL (ref 3.8–4.9)
Alkaline Phosphatase: 164 IU/L — ABNORMAL HIGH (ref 44–121)
BUN/Creatinine Ratio: 29 — ABNORMAL HIGH (ref 9–23)
BUN: 44 mg/dL — ABNORMAL HIGH (ref 6–24)
Bilirubin Total: 0.3 mg/dL (ref 0.0–1.2)
CO2: 24 mmol/L (ref 20–29)
Calcium: 8.8 mg/dL (ref 8.7–10.2)
Chloride: 93 mmol/L — ABNORMAL LOW (ref 96–106)
Creatinine, Ser: 1.5 mg/dL — ABNORMAL HIGH (ref 0.57–1.00)
Globulin, Total: 2.8 g/dL (ref 1.5–4.5)
Glucose: 119 mg/dL — ABNORMAL HIGH (ref 70–99)
Potassium: 4.2 mmol/L (ref 3.5–5.2)
Sodium: 138 mmol/L (ref 134–144)
Total Protein: 6.9 g/dL (ref 6.0–8.5)
eGFR: 40 mL/min/{1.73_m2} — ABNORMAL LOW (ref >59)

## 2022-09-19 LAB — MAGNESIUM: Magnesium: 2.1 mg/dL (ref 1.6–2.3)

## 2022-09-19 LAB — VITAMIN D 25 HYDROXY (VIT D DEFICIENCY, FRACTURES): Vit D, 25-Hydroxy: 45.9 ng/mL (ref 30.0–100.0)

## 2022-09-19 LAB — TSH: TSH: 0.19 u[IU]/mL — ABNORMAL LOW (ref 0.450–4.500)

## 2022-09-19 LAB — PHOSPHORUS: Phosphorus: 4.6 mg/dL — ABNORMAL HIGH (ref 3.0–4.3)

## 2022-09-20 NOTE — Progress Notes (Signed)
Blood count abnormal. Wbc decreased, but still elevated.  Liver function normal.  Kidney function abnormal. Worsening. Thyroid function abnormal. Decrease levothyroxine to 150 mcg once in am and change to brand name.  Phosphorus is normal. Magnesium normal. Vitamin D normalized.

## 2022-09-21 ENCOUNTER — Other Ambulatory Visit: Payer: Self-pay

## 2022-09-21 MED ORDER — SYNTHROID 150 MCG PO TABS: 150.0000 ug | ORAL_TABLET | Freq: Every day | ORAL | 0 refills | Status: DC

## 2022-09-24 ENCOUNTER — Other Ambulatory Visit: Payer: Self-pay | Admitting: Family Medicine

## 2022-09-25 ENCOUNTER — Other Ambulatory Visit: Payer: Self-pay

## 2022-09-25 DIAGNOSIS — N3000 Acute cystitis without hematuria: Secondary | ICD-10-CM

## 2022-09-25 LAB — URINE CULTURE: Organism ID, Bacteria: NO GROWTH

## 2022-09-25 MED ORDER — LEVOTHYROXINE SODIUM 150 MCG PO TABS: 150.0000 ug | ORAL_TABLET | Freq: Every day | ORAL | 1 refills | Status: DC

## 2022-09-27 LAB — URINE CULTURE

## 2022-10-22 ENCOUNTER — Other Ambulatory Visit: Payer: Self-pay | Admitting: Oncology

## 2022-10-22 DIAGNOSIS — D72829 Elevated white blood cell count, unspecified: Secondary | ICD-10-CM

## 2022-10-22 NOTE — Progress Notes (Signed)
[facility]  [address] [place],  [place]  [id number] [phone]  Clinic Day:  10/23/2022  Referring physician: Blane Parrish, Kirsten, MD  HISTORY OF PRESENT ILLNESS:  The patient is Molly 57 y.o. female with Molly history of both leukocytosis and anemia.  The patient recently received iron as her iron parameters were trending in the wrong direction.  She comes in today to reassess her peripheral counts.  Since her last visit, the patient has been doing well.  She denies having increased fatigue or any weakness which concerns her for progressive anemia.  She also denies having any recent infections to explain her previously elevated white count.  PHYSICAL EXAM:  Blood pressure (!) 111/56, pulse 86, temperature 97.6 F (36.4 C), resp. rate 14, height 5\' 6"  (1.676 m), weight 169 lb 8 oz (76.9 kg), SpO2 97 %. Wt Readings from Last 3 Encounters:  10/23/22 169 lb 8 oz (76.9 kg)  09/11/22 166 lb 1.3 oz (75.3 kg)  09/11/22 166 lb (75.3 kg)   Body mass index is 27.36 kg/m. Performance status (ECOG): 2 - Symptomatic, <50% confined to bed Physical Exam Constitutional:      Appearance: Normal appearance. She is ill-appearing (chronically ill appearance, but  she looks better vs previous visits).  HENT:     Mouth/Throat:     Mouth: Mucous membranes are moist.     Pharynx: Oropharynx is clear. No oropharyngeal exudate or posterior oropharyngeal erythema.  Cardiovascular:     Rate and Rhythm: Normal rate and regular rhythm.     Heart sounds: No murmur heard.    No friction rub. No gallop.  Pulmonary:     Effort: Pulmonary effort is normal. No respiratory distress.     Breath sounds: Examination of the left-lower field reveals rales. Rales present. No wheezing or rhonchi.  Abdominal:     General: Bowel sounds are normal. There is no distension.     Palpations: Abdomen is soft. There is no mass.     Tenderness: There is no abdominal tenderness.  Musculoskeletal:         General: No swelling.     Right lower leg: No edema.     Left lower leg: No edema.  Lymphadenopathy:     Cervical: No cervical adenopathy.     Upper Body:     Right upper body: No supraclavicular or axillary adenopathy.     Left upper body: No supraclavicular or axillary adenopathy.     Lower Body: No right inguinal adenopathy. No left inguinal adenopathy.  Skin:    General: Skin is warm.     Coloration: Skin is not jaundiced.     Findings: No lesion or rash.  Neurological:     General: No focal deficit present.     Mental Status: She is alert and oriented to person, place, and time. Mental status is at baseline.  Psychiatric:        Mood and Affect: Mood normal.        Behavior: Behavior normal.        Thought Content: Thought content normal.    LABS:    Component Ref Range & Units 1 d ago  Sodium 136 - 145 mmol/L 134 Low   Potassium 3.4 - 4.5 mmol/L 3.8  Chloride 98 - 107 mmol/L 97 Low   CO2 21 - 31 mmol/L 27  Anion Gap 6 - 14 mmol/L 10  Glucose, Random 70 - 99 mg/dL 86  Blood Urea Nitrogen (BUN)  7 - 25 mg/dL 38 High   Creatinine 1.610.60 - 1.20 mg/dL 0.961.32 High   eGFR >04>59 VW/UJW/1.19J4mL/min/1.73m2 47 Low   Comment: GFR estimated by CKD-EPI equations(NKF 2021).  "Recommend confirmation of Cr-based eGFR by using Cys-based eGFR and other filtration markers (if applicable) in complex cases and clinical decision-making, as needed."  Albumin 3.5 - 5.7 g/dL 3.5  Total Protein 6.4 - 8.9 g/dL 6.3 Low   Bilirubin, Total 0.3 - 1.0 mg/dL 0.9  Alkaline Phosphatase (ALP) 34 - 104 U/L 115 High   Aspartate Aminotransferase (AST) 13 - 39 U/L 18  Alanine Aminotransferase (ALT) 7 - 52 U/L 16  Calcium 8.6 - 10.3 mg/dL 8.2 Low   BUN/Creatinine Ratio 10.0 - 20.0 28.8 High   Comment: Potential prerenal damage (e.g. CHF, GI Bleeding)  Resulting Agency [facility](CLIA# [id number])   Specimen Collected: 10/22/22 15:18   IRON STUDIES PENDING   ASSESSMENT  & PLAN:  Assessment/Plan:  Molly 57 y.o. female with both leukocytosis and anemia, which in the past has been related to iron deficiency.  When evaluating her labs today, despite receiving IV iron 2 months ago, this patient's hemoglobin is lower today than what it was previously.  She does have Molly component of renal insufficiency which could also be playing Molly role into her anemia.  Based upon that, I will arrange for her to receive Retacrit injections at 20,000 units monthly, with the goal being to get her hemoglobin back above 10.  I will see her back in 3 months to reassess her anemia.  If there is no improvement in her hemoglobin after Retacrit injections, Molly bone marrow biopsy may need to be done next to ensure that there are no intrinsic marrow disorders behind her refractory anemia.  The patient understands all the plans discussed today and is in agreement with them.    Molly Parrish Molly FunkA Carles Florea, MD

## 2022-10-23 ENCOUNTER — Inpatient Hospital Stay: Payer: 59

## 2022-10-23 ENCOUNTER — Telehealth: Payer: Self-pay

## 2022-10-23 ENCOUNTER — Inpatient Hospital Stay: Payer: 59 | Attending: Oncology | Admitting: Oncology

## 2022-10-23 ENCOUNTER — Other Ambulatory Visit: Payer: Self-pay | Admitting: Oncology

## 2022-10-23 VITALS — BP 111/56 | HR 86 | Temp 97.6°F | Resp 14 | Ht 66.0 in | Wt 169.5 lb

## 2022-10-23 DIAGNOSIS — D649 Anemia, unspecified: Secondary | ICD-10-CM | POA: Diagnosis not present

## 2022-10-23 DIAGNOSIS — E611 Iron deficiency: Secondary | ICD-10-CM | POA: Insufficient documentation

## 2022-10-23 DIAGNOSIS — D72829 Elevated white blood cell count, unspecified: Secondary | ICD-10-CM | POA: Insufficient documentation

## 2022-10-23 DIAGNOSIS — D638 Anemia in other chronic diseases classified elsewhere: Secondary | ICD-10-CM

## 2022-10-23 DIAGNOSIS — D508 Other iron deficiency anemias: Secondary | ICD-10-CM

## 2022-10-23 DIAGNOSIS — N289 Disorder of kidney and ureter, unspecified: Secondary | ICD-10-CM | POA: Insufficient documentation

## 2022-10-23 DIAGNOSIS — Z79899 Other long term (current) drug therapy: Secondary | ICD-10-CM | POA: Insufficient documentation

## 2022-10-23 LAB — IRON AND TIBC
Iron: 55 ug/dL (ref 28–170)
Saturation Ratios: 21 % (ref 10.4–31.8)
TIBC: 263 ug/dL (ref 250–450)
UIBC: 208 ug/dL

## 2022-10-23 LAB — FERRITIN: Ferritin: 543 ng/mL — ABNORMAL HIGH (ref 11–307)

## 2022-10-23 LAB — CBC AND DIFFERENTIAL
HCT: 27 — AB (ref 36–46)
Hemoglobin: 8.8 — AB (ref 12.0–16.0)
Neutrophils Absolute: 6.75
Platelets: 301 10*3/uL (ref 150–400)
WBC: 9.5

## 2022-10-23 LAB — CBC: RBC: 2.98 — AB (ref 3.87–5.11)

## 2022-10-23 NOTE — Telephone Encounter (Signed)
10/23/22 Pt notified that lab results not available as of 1620. I will call her on Monday w/results.

## 2022-10-24 ENCOUNTER — Other Ambulatory Visit: Payer: Self-pay | Admitting: Oncology

## 2022-10-26 NOTE — Telephone Encounter (Signed)
Latest Reference Range & Units 10/23/22 16:06  Iron 28 - 170 ug/dL 55  UIBC ug/dL 150  TIBC 413 - 643 ug/dL 837  Saturation Ratios 10.4 - 31.8 % 21  Ferritin 11 - 307 ng/mL 543 (H)  (H): Data is abnormally high   ASSESSMENT & PLAN:  Assessment/Plan:  A 57 y.o. female with both leukocytosis and anemia, which in the past has been related to iron deficiency.  When evaluating her labs today, despite receiving IV iron 2 months ago, this patient's hemoglobin is lower today than what it was previously.  She does have a component of renal insufficiency which could also be playing a role into her anemia.  Based upon that, I will arrange for her to receive Retacrit injections at 20,000 units monthly, with the goal being to get her hemoglobin back above 10.  I will see her back in 3 months to reassess her anemia.  If there is no improvement in her hemoglobin after Retacrit injections, a bone marrow biopsy may need to be done next to ensure that there are no intrinsic marrow disorders behind her refractory anemia.  The patient understands all the plans discussed today and is in agreement with them.     Dequincy Kirby Funk, MD

## 2022-10-27 ENCOUNTER — Encounter: Payer: Self-pay | Admitting: Oncology

## 2022-10-27 ENCOUNTER — Other Ambulatory Visit: Payer: 59

## 2022-10-27 ENCOUNTER — Inpatient Hospital Stay: Payer: 59

## 2022-10-27 DIAGNOSIS — D72829 Elevated white blood cell count, unspecified: Secondary | ICD-10-CM | POA: Diagnosis not present

## 2022-10-27 LAB — CBC WITH DIFFERENTIAL (CANCER CENTER ONLY)
Abs Immature Granulocytes: 0.03 10*3/uL (ref 0.00–0.07)
Basophils Absolute: 0.1 10*3/uL (ref 0.0–0.1)
Basophils Relative: 1 %
Eosinophils Absolute: 0.3 10*3/uL (ref 0.0–0.5)
Eosinophils Relative: 3 %
HCT: 27.4 % — ABNORMAL LOW (ref 36.0–46.0)
Hemoglobin: 8.1 g/dL — ABNORMAL LOW (ref 12.0–15.0)
Immature Granulocytes: 0 %
Lymphocytes Relative: 17 %
Lymphs Abs: 1.7 10*3/uL (ref 0.7–4.0)
MCH: 29.5 pg (ref 26.0–34.0)
MCHC: 29.6 g/dL — ABNORMAL LOW (ref 30.0–36.0)
MCV: 99.6 fL (ref 80.0–100.0)
Monocytes Absolute: 0.8 10*3/uL (ref 0.1–1.0)
Monocytes Relative: 8 %
Neutro Abs: 7.3 10*3/uL (ref 1.7–7.7)
Neutrophils Relative %: 71 %
Platelet Count: 304 10*3/uL (ref 150–400)
RBC: 2.75 MIL/uL — ABNORMAL LOW (ref 3.87–5.11)
RDW: 18.3 % — ABNORMAL HIGH (ref 11.5–15.5)
WBC Count: 10.3 10*3/uL (ref 4.0–10.5)
nRBC: 0 % (ref 0.0–0.2)

## 2022-10-28 ENCOUNTER — Other Ambulatory Visit: Payer: Self-pay | Admitting: Family Medicine

## 2022-10-29 ENCOUNTER — Inpatient Hospital Stay: Payer: 59

## 2022-10-29 VITALS — BP 105/62 | HR 82 | Temp 98.2°F | Resp 18 | Ht 66.0 in | Wt 165.1 lb

## 2022-10-29 DIAGNOSIS — D72829 Elevated white blood cell count, unspecified: Secondary | ICD-10-CM | POA: Diagnosis not present

## 2022-10-29 DIAGNOSIS — D500 Iron deficiency anemia secondary to blood loss (chronic): Secondary | ICD-10-CM

## 2022-10-29 MED ORDER — EPOETIN ALFA-EPBX 20000 UNIT/ML IJ SOLN
20000.0000 [IU] | Freq: Once | INTRAMUSCULAR | Status: AC
  Administered 2022-10-29: 20000 [IU] via SUBCUTANEOUS

## 2022-10-29 NOTE — Patient Instructions (Signed)

## 2022-11-01 ENCOUNTER — Other Ambulatory Visit: Payer: Self-pay | Admitting: Family Medicine

## 2022-11-01 DIAGNOSIS — R110 Nausea: Secondary | ICD-10-CM

## 2022-11-09 ENCOUNTER — Other Ambulatory Visit: Payer: Self-pay | Admitting: Family Medicine

## 2022-11-23 ENCOUNTER — Other Ambulatory Visit: Payer: Self-pay | Admitting: Family Medicine

## 2022-11-25 ENCOUNTER — Other Ambulatory Visit: Payer: Self-pay | Admitting: Family Medicine

## 2022-11-26 ENCOUNTER — Other Ambulatory Visit: Payer: 59

## 2022-11-26 ENCOUNTER — Other Ambulatory Visit: Payer: Self-pay | Admitting: Family Medicine

## 2022-11-26 ENCOUNTER — Inpatient Hospital Stay: Payer: 59 | Attending: Oncology

## 2022-11-26 DIAGNOSIS — D631 Anemia in chronic kidney disease: Secondary | ICD-10-CM | POA: Diagnosis present

## 2022-11-26 DIAGNOSIS — Z79899 Other long term (current) drug therapy: Secondary | ICD-10-CM | POA: Insufficient documentation

## 2022-11-26 DIAGNOSIS — N1832 Chronic kidney disease, stage 3b: Secondary | ICD-10-CM | POA: Insufficient documentation

## 2022-11-26 DIAGNOSIS — D508 Other iron deficiency anemias: Secondary | ICD-10-CM

## 2022-11-26 DIAGNOSIS — D72829 Elevated white blood cell count, unspecified: Secondary | ICD-10-CM | POA: Insufficient documentation

## 2022-11-26 LAB — CBC WITH DIFFERENTIAL (CANCER CENTER ONLY)
Abs Immature Granulocytes: 0.12 10*3/uL — ABNORMAL HIGH (ref 0.00–0.07)
Basophils Absolute: 0.1 10*3/uL (ref 0.0–0.1)
Basophils Relative: 1 %
Eosinophils Absolute: 0.2 10*3/uL (ref 0.0–0.5)
Eosinophils Relative: 2 %
HCT: 31 % — ABNORMAL LOW (ref 36.0–46.0)
Hemoglobin: 9.7 g/dL — ABNORMAL LOW (ref 12.0–15.0)
Immature Granulocytes: 1 %
Lymphocytes Relative: 17 %
Lymphs Abs: 2.1 10*3/uL (ref 0.7–4.0)
MCH: 29.9 pg (ref 26.0–34.0)
MCHC: 31.3 g/dL (ref 30.0–36.0)
MCV: 95.7 fL (ref 80.0–100.0)
Monocytes Absolute: 0.7 10*3/uL (ref 0.1–1.0)
Monocytes Relative: 6 %
Neutro Abs: 8.9 10*3/uL — ABNORMAL HIGH (ref 1.7–7.7)
Neutrophils Relative %: 73 %
Platelet Count: 499 10*3/uL — ABNORMAL HIGH (ref 150–400)
RBC: 3.24 MIL/uL — ABNORMAL LOW (ref 3.87–5.11)
RDW: 16.4 % — ABNORMAL HIGH (ref 11.5–15.5)
WBC Count: 12.2 10*3/uL — ABNORMAL HIGH (ref 4.0–10.5)
nRBC: 0 % (ref 0.0–0.2)

## 2022-11-26 LAB — IRON AND TIBC
Iron: 53 ug/dL (ref 28–170)
Saturation Ratios: 19 % (ref 10.4–31.8)
TIBC: 273 ug/dL (ref 250–450)
UIBC: 220 ug/dL

## 2022-11-26 LAB — FERRITIN: Ferritin: 582 ng/mL — ABNORMAL HIGH (ref 11–307)

## 2022-11-30 ENCOUNTER — Inpatient Hospital Stay: Payer: 59

## 2022-11-30 VITALS — BP 111/76 | HR 86 | Temp 97.7°F | Resp 16 | Ht 66.0 in | Wt 166.0 lb

## 2022-11-30 DIAGNOSIS — D500 Iron deficiency anemia secondary to blood loss (chronic): Secondary | ICD-10-CM

## 2022-11-30 DIAGNOSIS — N1832 Chronic kidney disease, stage 3b: Secondary | ICD-10-CM | POA: Diagnosis not present

## 2022-11-30 MED ORDER — EPOETIN ALFA-EPBX 20000 UNIT/ML IJ SOLN
20000.0000 [IU] | Freq: Once | INTRAMUSCULAR | Status: AC
  Administered 2022-11-30: 20000 [IU] via SUBCUTANEOUS
  Filled 2022-11-30: qty 1

## 2022-12-01 LAB — SOLUBLE TRANSFERRIN RECEPTOR: Transferrin Receptor: 34.5 nmol/L — ABNORMAL HIGH (ref 12.2–27.3)

## 2022-12-08 ENCOUNTER — Other Ambulatory Visit: Payer: Self-pay | Admitting: Physician Assistant

## 2022-12-08 ENCOUNTER — Other Ambulatory Visit: Payer: Self-pay | Admitting: Family Medicine

## 2022-12-08 DIAGNOSIS — K140 Glossitis: Secondary | ICD-10-CM

## 2022-12-18 ENCOUNTER — Ambulatory Visit: Payer: 59 | Admitting: Family Medicine

## 2022-12-27 ENCOUNTER — Other Ambulatory Visit: Payer: Self-pay | Admitting: Family Medicine

## 2022-12-28 ENCOUNTER — Inpatient Hospital Stay: Payer: 59 | Attending: Oncology

## 2022-12-28 DIAGNOSIS — N1832 Chronic kidney disease, stage 3b: Secondary | ICD-10-CM | POA: Insufficient documentation

## 2022-12-28 DIAGNOSIS — D631 Anemia in chronic kidney disease: Secondary | ICD-10-CM | POA: Insufficient documentation

## 2022-12-28 DIAGNOSIS — D72829 Elevated white blood cell count, unspecified: Secondary | ICD-10-CM | POA: Diagnosis not present

## 2022-12-28 LAB — CBC WITH DIFFERENTIAL (CANCER CENTER ONLY)
Abs Immature Granulocytes: 0.03 10*3/uL (ref 0.00–0.07)
Basophils Absolute: 0.1 10*3/uL (ref 0.0–0.1)
Basophils Relative: 1 %
Eosinophils Absolute: 0.4 10*3/uL (ref 0.0–0.5)
Eosinophils Relative: 4 %
HCT: 33.8 % — ABNORMAL LOW (ref 36.0–46.0)
Hemoglobin: 10.4 g/dL — ABNORMAL LOW (ref 12.0–15.0)
Immature Granulocytes: 0 %
Lymphocytes Relative: 19 %
Lymphs Abs: 1.9 10*3/uL (ref 0.7–4.0)
MCH: 29 pg (ref 26.0–34.0)
MCHC: 30.8 g/dL (ref 30.0–36.0)
MCV: 94.2 fL (ref 80.0–100.0)
Monocytes Absolute: 0.7 10*3/uL (ref 0.1–1.0)
Monocytes Relative: 7 %
Neutro Abs: 6.9 10*3/uL (ref 1.7–7.7)
Neutrophils Relative %: 69 %
Platelet Count: 324 10*3/uL (ref 150–400)
RBC: 3.59 MIL/uL — ABNORMAL LOW (ref 3.87–5.11)
RDW: 15.2 % (ref 11.5–15.5)
WBC Count: 9.9 10*3/uL (ref 4.0–10.5)
nRBC: 0 % (ref 0.0–0.2)

## 2022-12-30 ENCOUNTER — Other Ambulatory Visit: Payer: Self-pay | Admitting: Pharmacist

## 2022-12-30 ENCOUNTER — Telehealth: Payer: Self-pay | Admitting: Oncology

## 2022-12-30 NOTE — Telephone Encounter (Signed)
12/30/22 Spoke with patient and cancelled inj appt on 12/31/22-Per scheduling note

## 2022-12-31 ENCOUNTER — Inpatient Hospital Stay: Payer: 59

## 2023-01-19 ENCOUNTER — Other Ambulatory Visit: Payer: Self-pay | Admitting: Family Medicine

## 2023-01-20 ENCOUNTER — Other Ambulatory Visit: Payer: Self-pay

## 2023-01-20 MED ORDER — FUROSEMIDE 40 MG PO TABS: 80.0000 mg | ORAL_TABLET | Freq: Three times a day (TID) | ORAL | 0 refills | Status: DC

## 2023-01-26 ENCOUNTER — Other Ambulatory Visit: Payer: Self-pay | Admitting: Family Medicine

## 2023-01-27 ENCOUNTER — Other Ambulatory Visit: Payer: Self-pay | Admitting: Physician Assistant

## 2023-01-29 ENCOUNTER — Ambulatory Visit: Payer: 59 | Admitting: Oncology

## 2023-01-29 ENCOUNTER — Other Ambulatory Visit: Payer: 59

## 2023-02-01 ENCOUNTER — Ambulatory Visit: Payer: 59

## 2023-02-01 NOTE — Progress Notes (Signed)
[facility]  [address] [place],  [place]  [address] [phone]  Clinic Day:  02/02/2023  Referring physician: Blane Ohara, MD  HISTORY OF PRESENT ILLNESS:  The patient is a 57 y.o. female with a history of both leukocytosis and anemia.  In the recent past, the patient has received IV iron and monthly Retacrit shots with the goal to improve her hemoglobin.   She comes in today to reassess her peripheral counts.  Since her last visit, the patient has been doing well.  She denies having increased fatigue or any weakness which concerns her for progressive anemia.    PHYSICAL EXAM:  Blood pressure 115/66, pulse (!) 101, temperature 99 F (37.2 C), resp. rate 16, height 5\' 6"  (1.676 m), weight 161 lb 12.8 oz (73.4 kg), SpO2 90%. Wt Readings from Last 3 Encounters:  02/02/23 161 lb 12.8 oz (73.4 kg)  11/30/22 166 lb 0.6 oz (75.3 kg)  10/29/22 165 lb 1.9 oz (74.9 kg)   Body mass index is 26.12 kg/m. Performance status (ECOG): 2 - Symptomatic, <50% confined to bed Physical Exam Constitutional:      Appearance: Normal appearance. She is ill-appearing (chronically ill appearance, but  she looks better vs previous visits).  HENT:     Mouth/Throat:     Mouth: Mucous membranes are moist.     Pharynx: Oropharynx is clear. No oropharyngeal exudate or posterior oropharyngeal erythema.  Cardiovascular:     Rate and Rhythm: Normal rate and regular rhythm.     Heart sounds: No murmur heard.    No friction rub. No gallop.  Pulmonary:     Effort: Pulmonary effort is normal. No respiratory distress.     Breath sounds: Examination of the left-lower field reveals rales. Rales present. No wheezing or rhonchi.  Abdominal:     General: Bowel sounds are normal. There is no distension.     Palpations: Abdomen is soft. There is no mass.     Tenderness: There is no abdominal tenderness.  Musculoskeletal:        General: No swelling.     Right lower leg: No edema.      Left lower leg: No edema.  Lymphadenopathy:     Cervical: No cervical adenopathy.     Upper Body:     Right upper body: No supraclavicular or axillary adenopathy.     Left upper body: No supraclavicular or axillary adenopathy.     Lower Body: No right inguinal adenopathy. No left inguinal adenopathy.  Skin:    General: Skin is warm.     Coloration: Skin is not jaundiced.     Findings: No lesion or rash.  Neurological:     General: No focal deficit present.     Mental Status: She is alert and oriented to person, place, and time. Mental status is at baseline.  Psychiatric:        Mood and Affect: Mood normal.        Behavior: Behavior normal.        Thought Content: Thought content normal.    LABS:    Iron B12 folate  ASSESSMENT & PLAN:  Assessment/Plan:  A 57 y.o. female with both leukocytosis and anemia, which in the past has been related to iron deficiency.  When evaluating her labs today, despite receiving IV iron and receiving Retacrit shots, her hemoglobin remains suboptimal.    Her vitamin levels are.....  I will arrange for her to undergo a bone marrow biopsy  The patient understands all the plans discussed today and is in agreement with them.    Gleason Ardoin Kirby Funk, MD

## 2023-02-02 ENCOUNTER — Other Ambulatory Visit: Payer: Self-pay | Admitting: Oncology

## 2023-02-02 ENCOUNTER — Inpatient Hospital Stay (HOSPITAL_BASED_OUTPATIENT_CLINIC_OR_DEPARTMENT_OTHER): Payer: 59 | Admitting: Oncology

## 2023-02-02 ENCOUNTER — Inpatient Hospital Stay: Payer: 59 | Attending: Oncology

## 2023-02-02 ENCOUNTER — Ambulatory Visit: Payer: 59 | Admitting: Family Medicine

## 2023-02-02 ENCOUNTER — Telehealth: Payer: Self-pay

## 2023-02-02 VITALS — BP 115/66 | HR 101 | Temp 99.0°F | Resp 16 | Ht 66.0 in | Wt 161.8 lb

## 2023-02-02 DIAGNOSIS — Z79899 Other long term (current) drug therapy: Secondary | ICD-10-CM | POA: Insufficient documentation

## 2023-02-02 DIAGNOSIS — N1832 Chronic kidney disease, stage 3b: Secondary | ICD-10-CM | POA: Insufficient documentation

## 2023-02-02 DIAGNOSIS — D500 Iron deficiency anemia secondary to blood loss (chronic): Secondary | ICD-10-CM

## 2023-02-02 DIAGNOSIS — D649 Anemia, unspecified: Secondary | ICD-10-CM | POA: Diagnosis not present

## 2023-02-02 DIAGNOSIS — E611 Iron deficiency: Secondary | ICD-10-CM | POA: Diagnosis not present

## 2023-02-02 DIAGNOSIS — D631 Anemia in chronic kidney disease: Secondary | ICD-10-CM | POA: Insufficient documentation

## 2023-02-02 DIAGNOSIS — D72829 Elevated white blood cell count, unspecified: Secondary | ICD-10-CM | POA: Diagnosis not present

## 2023-02-02 DIAGNOSIS — D508 Other iron deficiency anemias: Secondary | ICD-10-CM

## 2023-02-02 LAB — IRON AND TIBC
Iron: 31 ug/dL (ref 28–170)
Saturation Ratios: 15 % (ref 10.4–31.8)
TIBC: 211 ug/dL — ABNORMAL LOW (ref 250–450)
UIBC: 180 ug/dL

## 2023-02-02 LAB — CBC AND DIFFERENTIAL
HCT: 28 — AB (ref 36–46)
Hemoglobin: 9 — AB (ref 12.0–16.0)
Neutrophils Absolute: 12.75
Platelets: 425 10*3/uL — AB (ref 150–400)
WBC: 15

## 2023-02-02 LAB — CBC: RBC: 3.05 — AB (ref 3.87–5.11)

## 2023-02-02 LAB — VITAMIN B12: Vitamin B-12: 1182 pg/mL — ABNORMAL HIGH (ref 180–914)

## 2023-02-02 LAB — FOLATE: Folate: 32.6 ng/mL (ref >5.9)

## 2023-02-02 LAB — FERRITIN: Ferritin: 381 ng/mL — ABNORMAL HIGH (ref 11–307)

## 2023-02-02 NOTE — Telephone Encounter (Signed)
Iron panel results are not back as of yet @1638 . Pt notified we will call her back in the morning. She verbalized understanding.   02/03/2023- Dr Melvyn Neth asked me to , "Let pt know that none of her vitamin levels is low. She will need a bm biopsy; I will determine when either I or interventional radiology can do it".

## 2023-02-03 ENCOUNTER — Other Ambulatory Visit: Payer: Self-pay | Admitting: Oncology

## 2023-02-03 DIAGNOSIS — D638 Anemia in other chronic diseases classified elsewhere: Secondary | ICD-10-CM

## 2023-02-04 ENCOUNTER — Encounter: Payer: Self-pay | Admitting: Oncology

## 2023-02-05 ENCOUNTER — Inpatient Hospital Stay: Payer: 59

## 2023-02-11 ENCOUNTER — Encounter: Payer: Self-pay | Admitting: Oncology

## 2023-02-17 ENCOUNTER — Other Ambulatory Visit: Payer: Self-pay | Admitting: Family Medicine

## 2023-02-19 ENCOUNTER — Other Ambulatory Visit (HOSPITAL_COMMUNITY)
Admission: RE | Admit: 2023-02-19 | Discharge: 2023-02-19 | Disposition: A | Discharge status: Home / Self Care | Payer: 59 | Source: Ambulatory Visit | Attending: Oncology | Admitting: Oncology

## 2023-02-19 LAB — CBC AND DIFFERENTIAL
HCT: 30 — AB (ref 36–46)
Hemoglobin: 9.8 — AB (ref 12.0–16.0)
Neutrophils Absolute: 6.89
Platelets: 392 10*3/uL (ref 150–400)
WBC: 9.7

## 2023-02-19 LAB — CBC: RBC: 3.29 — AB (ref 3.87–5.11)

## 2023-02-24 ENCOUNTER — Other Ambulatory Visit (HOSPITAL_COMMUNITY)
Admission: RE | Admit: 2023-02-24 | Discharge: 2023-02-24 | Disposition: A | Discharge status: Home / Self Care | Payer: 59 | Source: Other Acute Inpatient Hospital | Attending: Hematology and Oncology | Admitting: Hematology and Oncology

## 2023-02-24 DIAGNOSIS — D72829 Elevated white blood cell count, unspecified: Secondary | ICD-10-CM | POA: Diagnosis not present

## 2023-02-24 DIAGNOSIS — Z1379 Encounter for other screening for genetic and chromosomal anomalies: Secondary | ICD-10-CM | POA: Insufficient documentation

## 2023-02-24 DIAGNOSIS — D500 Iron deficiency anemia secondary to blood loss (chronic): Secondary | ICD-10-CM | POA: Diagnosis not present

## 2023-02-24 DIAGNOSIS — D638 Anemia in other chronic diseases classified elsewhere: Secondary | ICD-10-CM | POA: Diagnosis present

## 2023-02-26 LAB — SURGICAL PATHOLOGY

## 2023-03-01 ENCOUNTER — Encounter: Payer: Self-pay | Admitting: Family Medicine

## 2023-03-01 ENCOUNTER — Ambulatory Visit (INDEPENDENT_AMBULATORY_CARE_PROVIDER_SITE_OTHER): Payer: 59 | Admitting: Family Medicine

## 2023-03-01 VITALS — BP 104/60 | HR 78 | Temp 97.2°F | Resp 18 | Ht 66.0 in | Wt 159.0 lb

## 2023-03-01 DIAGNOSIS — D638 Anemia in other chronic diseases classified elsewhere: Secondary | ICD-10-CM | POA: Diagnosis not present

## 2023-03-01 DIAGNOSIS — E890 Postprocedural hypothyroidism: Secondary | ICD-10-CM | POA: Diagnosis not present

## 2023-03-01 DIAGNOSIS — E43 Unspecified severe protein-calorie malnutrition: Secondary | ICD-10-CM

## 2023-03-01 DIAGNOSIS — F04 Amnestic disorder due to known physiological condition: Secondary | ICD-10-CM

## 2023-03-01 DIAGNOSIS — R296 Repeated falls: Secondary | ICD-10-CM

## 2023-03-01 DIAGNOSIS — F1021 Alcohol dependence, in remission: Secondary | ICD-10-CM

## 2023-03-01 DIAGNOSIS — K7030 Alcoholic cirrhosis of liver without ascites: Secondary | ICD-10-CM

## 2023-03-01 DIAGNOSIS — F3132 Bipolar disorder, current episode depressed, moderate: Secondary | ICD-10-CM

## 2023-03-01 DIAGNOSIS — K766 Portal hypertension: Secondary | ICD-10-CM

## 2023-03-01 DIAGNOSIS — L97522 Non-pressure chronic ulcer of other part of left foot with fat layer exposed: Secondary | ICD-10-CM

## 2023-03-01 NOTE — Assessment & Plan Note (Signed)
Recommend discuss quad cane with podiatry when she sees them in 3-4 days

## 2023-03-01 NOTE — Assessment & Plan Note (Signed)
Management per psychiatry Continue Lamictal, Zoloft, quetiapine, and Xanax.

## 2023-03-01 NOTE — Assessment & Plan Note (Signed)
Check cmp 

## 2023-03-01 NOTE — Assessment & Plan Note (Signed)
Secondary to gastric bypass.  Secondary to liver cirrhosis.

## 2023-03-01 NOTE — Assessment & Plan Note (Signed)
Management per specialist.  Dr. Melvyn Neth. Recent hb improved.

## 2023-03-01 NOTE — Progress Notes (Unsigned)
[facility]  [address] [place],  [place]  [address] [phone]  Clinic Day:  03/02/2023  Referring physician: Blane Ohara, MD  HISTORY OF PRESENT ILLNESS:  The patient is a 57 y.o. female with a history of both leukocytosis and anemia.  Despite receiving IV iron and Retacrit injections over these past months to improve her anemia, her hemoglobin has remained low.  This led to her undergoing a bone marrow biopsy for further evaluation.  She comes in today to go over her bone marrow biopsy results and their implications.  PHYSICAL EXAM:  Blood pressure (!) 98/55, pulse 77, temperature 98.2 F (36.8 C), resp. rate 16, height 5\' 6"  (1.676 m), SpO2 93%. Wt Readings from Last 3 Encounters:  03/01/23 159 lb (72.1 kg)  02/02/23 161 lb 12.8 oz (73.4 kg)  11/30/22 166 lb 0.6 oz (75.3 kg)   Body mass index is 25.66 kg/m. Performance status (ECOG): 2 - Symptomatic, <50% confined to bed Physical Exam Constitutional:      Appearance: Normal appearance. She is ill-appearing (chronically ill appearance, but  she looks better vs previous visits).     Comments: She is in a wheelchair  HENT:     Mouth/Throat:     Mouth: Mucous membranes are moist.     Pharynx: Oropharynx is clear. No oropharyngeal exudate or posterior oropharyngeal erythema.  Cardiovascular:     Rate and Rhythm: Normal rate and regular rhythm.     Heart sounds: No murmur heard.    No friction rub. No gallop.  Pulmonary:     Effort: Pulmonary effort is normal. No respiratory distress.     Breath sounds: Examination of the left-lower field reveals rales. Rales present. No wheezing or rhonchi.  Abdominal:     General: Bowel sounds are normal. There is no distension.     Palpations: Abdomen is soft. There is no mass.     Tenderness: There is no abdominal tenderness.  Musculoskeletal:        General: No swelling.     Right lower leg: No edema.     Left lower leg: No edema.   Lymphadenopathy:     Cervical: No cervical adenopathy.     Upper Body:     Right upper body: No supraclavicular or axillary adenopathy.     Left upper body: No supraclavicular or axillary adenopathy.     Lower Body: No right inguinal adenopathy. No left inguinal adenopathy.  Skin:    General: Skin is warm.     Coloration: Skin is not jaundiced.     Findings: No lesion or rash.  Neurological:     General: No focal deficit present.     Mental Status: She is alert and oriented to person, place, and time. Mental status is at baseline.  Psychiatric:        Mood and Affect: Mood normal.        Behavior: Behavior normal.        Thought Content: Thought content normal.   PATHOLOGY:  Her bone marrow biopsy revealed the following: DIAGNOSIS:  BONE MARROW, ASPIRATE, CLOT, CORE: -Normocellular bone marrow for age with trilineage hematopoiesis -See comment  PERIPHERAL BLOOD: -Normocytic-hypochromic anemia -Leukocytosis  COMMENT: .  The bone marrow is normocellular for age with trilineage hematopoiesis. No significant dyspoiesis or increase in blastic cells identified. There is no morphologic evidence of a lymphoproliferative disorder or plasma cell neoplasm.  Correlation with cytogenetic studies is recommended.  MICROSCOPIC DESCRIPTION:  PERIPHERAL BLOOD  SMEAR: The red blood cells display mild anisopoikilocytosis with mild polychromasia.  The white blood cells are slightly increased in number, mostly with neutrophils some of which display toxic granulation.  The platelets are normal in number.  BONE MARROW ASPIRATE: Bone marrow particles present Erythroid precursors: Orderly and progressive maturation for the most part.  Only occasional late precursors display nuclear cytoplasmic dyssynchrony. Granulocytic precursors: Orderly and progressive maturation.  Scattered mature neutrophils display mild toxic granulation. Megakaryocytes: Abundant with predominantly normal  morphology Lymphocytes/plasma cells: The plasma cells are slightly increased in number representing 5% of all cells with lack of large aggregates or sheets.  Large lymphoid aggregates are not seen.  TOUCH PREPARATIONS: A mixture of cell types present  CLOT AND BIOPSY: The sections show 40 to 50% cellularity with a mixture of cell types.  Large clusters or sheets of plasma cells are not identified, and significant lymphoid aggregates are not seen. Immunohistochemical stain for CD138 and in situ hybridization for kappa lambda were performed with appropriate controls.  CD138 highlights the plasma cell component consisting of interstitial cells and small clusters and displays polyclonal staining pattern for kappa and lambda light chains.  IRON STAIN: Iron stains are performed on a bone marrow aspirate or touch imprint smear and section of clot. The controls stained appropriately.       Storage Iron: Abundant      Ring Sideroblasts: Absent   LABS:    Latest Reference Range & Units 03/01/23 16:27  WBC 3.4 - 10.8 x10E3/uL 9.1  RBC 3.77 - 5.28 x10E6/uL 3.32 (L)  Hemoglobin 11.1 - 15.9 g/dL 9.5 (L)  HCT 86.5 - 78.4 % 30.6 (L)  MCV 79 - 97 fL 92  MCH 26.6 - 33.0 pg 28.6  MCHC 31.5 - 35.7 g/dL 69.6 (L)  RDW 29.5 - 28.4 % 13.4  Platelets 150 - 450 x10E3/uL 323  (L): Data is abnormally low  Latest Reference Range & Units 03/01/23 16:27  Sodium 134 - 144 mmol/L 135  Potassium 3.5 - 5.2 mmol/L 4.1  Chloride 96 - 106 mmol/L 96  CO2 20 - 29 mmol/L 25  Glucose 70 - 99 mg/dL 74  BUN 6 - 24 mg/dL 36 (H)  Creatinine 1.32 - 1.00 mg/dL 4.40 (H)  Calcium 8.7 - 10.2 mg/dL 8.8  BUN/Creatinine Ratio 9 - 23  25 (H)  eGFR >59 mL/min/1.73 42 (L)  Alkaline Phosphatase 44 - 121 IU/L 210 (H)  Albumin 3.8 - 4.9 g/dL 2.9 (L)  AST 0 - 40 IU/L 24  ALT 0 - 32 IU/L 15  Total Protein 6.0 - 8.5 g/dL 5.1 (L)  Total Bilirubin 0.0 - 1.2 mg/dL 0.3  (H): Data is abnormally high (L): Data is abnormally  low  ASSESSMENT & PLAN:  Assessment/Plan:  A 57 y.o. female with both leukocytosis and anemia.in clinic today, I went over all of her bone marrow biopsy results with her, for which she was pleased to find out there is no underlying infiltrative process present.  Her labs today still show a suboptimal hemoglobin.  However, her white count is normal.  I do believe her renal insufficiency may be playing a greater role into her anemia than previously anticipated.  As mentioned previously, she was receiving Retacrit on a monthly basis.  However, the dose was 20,000 units.  I will increase her monthly Retacrit dose to 40,000 units to see if this will lead to an improvement in her hemoglobin.  Her CBC will be checked monthly to see how well  she is responding to her Retacrit shots.  I will see her back in approximately 3 months for repeat clinical assessment.  The patient understands all the plans discussed today and is in agreement with them.     Kirby Funk, MD

## 2023-03-01 NOTE — Progress Notes (Signed)
Duplicate

## 2023-03-01 NOTE — Assessment & Plan Note (Signed)
Check tsh, free t4. 

## 2023-03-01 NOTE — Progress Notes (Signed)
Subjective:  Patient ID: Molly Parrish, female    DOB: [birth year]  Age: 57 y.o. MRN: [id number]  Chief Complaint  Patient presents with   Medical Management of Chronic Issues    HPI Constipation:  Patient presents for follow up on constipation. Patient was started on Amitiza 24 mcg take 1 capsule twice daily. Patient states medication is working great for her. Takes miralax once daily and benefiber daily.   Hypocalcemia: Calcium carbonate 600 mg 3 oral twice daily. Paresthesias. Cramps are still happening.   Hypoglycemia: has a meter. Sugars 65-70. Uses glucose tablets. Had gastric bypass 2005. Vomits twice a day. Eating 6 small meals per day. A meal consists of 2 eggs for breakfast. Snacks: yogurt with protein. Protein and a salad at lunch. 2 veggies and a meat for supper.   Weight is unchanged. Has some swelling in her hands and feet by the night.    Anemia: sees Dr. Rennis Harding. Hb improved and iron studies have improved.   Fall/right distal 1/3 clavicle shaft fracture-2A type Patient had a fall in June and was seen at [facility] ED.  The patient was taken to surgery 01/27/2023 because of high risk of nonunion and continued pain with no evidence of fracture healing.  Surgical intervention was scheduled and more comminution was evident once the fracture was clearly visualized with extraordinarily poor bone density with appeared woven.  The fracture ends were so comminuted that they appeared resorbed.  Total of 6 screws with 4 locking and 2 nonlocking were placed.  Fiberwire suturing was used to cerclage some of the comminuted bones to the plate.       03/01/2023    3:33 PM 06/15/2022   10:02 AM 12/05/2021   10:55 AM 11/14/2021   11:16 AM 10/22/2021   10:40 AM  Depression screen PHQ 2/9  Decreased Interest 0 0 0 0 1  Down, Depressed, Hopeless 0 0 0 0 1  PHQ - 2 Score 0 0 0 0 2  Altered sleeping 0 3   3  Tired, decreased energy 3 3   3   Change in appetite 0 0   0   Feeling bad or failure about yourself  0 0   0  Trouble concentrating 0 0   1  Moving slowly or fidgety/restless  1   0  Suicidal thoughts 0 0   0  PHQ-9 Score 3 7   9   Difficult doing work/chores Not difficult at all Somewhat difficult   Somewhat difficult        03/01/2023    3:32 PM  Fall Risk   Falls in the past year? 1  Number falls in past yr: 1  Injury with Fall? 1  Risk for fall due to : History of fall(s)  Follow up Falls evaluation completed;Falls prevention discussed    Patient Care Team: Blane Ohara, MD as PCP - General (Family Medicine) Doristine Bosworth., MD (Endocrinology) Carney Harder, MD as Referring Physician (Geriatric Medicine) [facility] Drema Dallas, DO as Consulting Physician (Neurology) Geraldine Contras, MD as Referring Physician (Psychiatry) Ancil Boozer, DPM (Inactive) as Referring Physician (Podiatry) Weston Settle, MD as Consulting Physician (Oncology) Barnabas Lister, MD as Referring Physician (Nephrology)   Review of Systems  Constitutional:  Positive for fatigue. Negative for chills and fever.  HENT:  Negative for congestion, rhinorrhea and sore throat.   Respiratory:  Positive for shortness of breath. Negative for cough.   Cardiovascular:  Negative for  chest pain.  Gastrointestinal:  Positive for constipation (controlled with current medications). Negative for abdominal pain, diarrhea, nausea and vomiting.  Genitourinary:  Negative for dysuria and urgency.  Musculoskeletal:  Negative for back pain and myalgias.  Neurological:  Positive for dizziness, light-headedness and numbness (hands). Negative for weakness and headaches.  Psychiatric/Behavioral:  Negative for dysphoric mood. The patient is not nervous/anxious.     Current Outpatient Medications on File Prior to Visit  Medication Sig Dispense Refill   acetaminophen (TYLENOL) 325 MG tablet Take by mouth.     ALPRAZolam (XANAX) 0.25 MG tablet TAKE 1 TABLET(0.25 MG) BY  MOUTH TWICE DAILY 60 tablet 2   budesonide-formoterol (SYMBICORT) 160-4.5 MCG/ACT inhaler Inhale 2 puffs into the lungs 2 (two) times daily. 10.2 g 2   calcitRIOL (ROCALTROL) 0.5 MCG capsule Take 0.5 mcg by mouth 2 (two) times daily.     calcium carbonate (OSCAL) 1500 (600 Ca) MG TABS tablet Take 3,600 mg by mouth 2 (two) times daily with a meal.     Cyanocobalamin (VITAMIN B 12 PO) Take 1,000 mcg by mouth daily.     cyclobenzaprine (FLEXERIL) 10 MG tablet TAKE 1 TABLET(10 MG) BY MOUTH THREE TIMES DAILY AS NEEDED FOR MUSCLE SPASMS 90 tablet 2   famotidine (PEPCID) 40 MG tablet Take 40 mg by mouth at bedtime.     furosemide (LASIX) 40 MG tablet Take 2 tablets (80 mg total) by mouth 3 (three) times daily between meals. 540 tablet 0   gabapentin (NEURONTIN) 100 MG capsule TAKE 1 CAPSULE(100 MG) BY MOUTH THREE TIMES DAILY 90 capsule 1   Glucagon (GVOKE HYPOPEN 2-PACK) 0.5 MG/0.1ML SOAJ If glucose less than 60, use gvoke pen x 1. May repeat if not responding x 1 daily. Call EMS if not improving. 0.2 mL 2   hydrOXYzine (VISTARIL) 25 MG capsule TAKE 1 CAPSULE(25 MG) BY MOUTH THREE TIMES DAILY 90 capsule 2   Iron-Vitamins (GERITOL COMPLETE) TABS Take 1 tablet by mouth daily.     lamoTRIgine (LAMICTAL) 200 MG tablet Take 200 mg by mouth at bedtime.     levothyroxine (SYNTHROID) 150 MCG tablet Take 1 tablet (150 mcg total) by mouth daily. 90 tablet 1   lubiprostone (AMITIZA) 24 MCG capsule TAKE 1 CAPSULE(24 MCG) BY MOUTH TWICE DAILY WITH A MEAL 60 capsule 5   magnesium oxide (MAG-OX) 400 MG tablet Take 400 mg by mouth daily.     ondansetron (ZOFRAN) 4 MG tablet TAKE 1 TABLET(4 MG) BY MOUTH EVERY 8 HOURS AS NEEDED FOR NAUSEA OR VOMITING 60 tablet 3   pantoprazole (PROTONIX) 40 MG tablet TAKE 1 TABLET(40 MG) BY MOUTH DAILY 90 tablet 0   phentermine (ADIPEX-P) 37.5 MG tablet Take 37.5 mg by mouth every morning.     promethazine (PHENERGAN) 25 MG tablet TAKE 1 TABLET BY MOUTH FOUR TIMES DAILY AS NEEDED FOR  NAUSEA OR VOMITING 40 tablet 2   propranolol (INDERAL) 10 MG tablet Take 10 mg by mouth at bedtime.     QUEtiapine (SEROQUEL) 50 MG tablet Take 100 mg by mouth at bedtime.     rifaximin (XIFAXAN) 550 MG TABS tablet Take 550 mg by mouth 2 (two) times daily.     sertraline (ZOLOFT) 100 MG tablet Take 100 mg by mouth in the morning and at bedtime.     sevelamer carbonate (RENVELA) 800 MG tablet Take 800 mg by mouth 3 (three) times daily with meals.     spironolactone (ALDACTONE) 50 MG tablet Take 100 mg by mouth  2 (two) times daily.     thiamine 100 MG tablet Take 100 mg by mouth daily.     traMADol (ULTRAM) 50 MG tablet TAKE 2 TABLETS BY MOUTH TWICE DAILY FOR BACK PAIN 120 tablet 1   triamcinolone (KENALOG) 0.1 % paste USE AS DIRECTED IN THE MOUTH OR THROAT TWICE DAILY. 5 g 2   Vitamin D, Ergocalciferol, (DRISDOL) 1.25 MG (50000 UNIT) CAPS capsule Take 1 capsule (50,000 Units total) by mouth 2 (two) times a week. 24 capsule 0   zolpidem (AMBIEN) 10 MG tablet TAKE 1 TABLET(10 MG) BY MOUTH AT BEDTIME AS NEEDED FOR SLEEP 30 tablet 3   Current Facility-Administered Medications on File Prior to Visit  Medication Dose Route Frequency Provider Last Rate Last Admin   ipratropium-albuterol (DUONEB) 0.5-2.5 (3) MG/3ML nebulizer solution 3 mL  3 mL Nebulization Once , Fritzi Mandes, MD       Past Medical History:  Diagnosis Date   Alcohol dependence in remission (HCC) 10/04/2013   Alcoholic cirrhosis of liver (HCC)    Anemia    Anemia of chronic disease 08/08/2016   Arterial hypotension 05/08/2020   Bipolar disorder current episode depressed (HCC)    Brain TIA 02/19/2020   CKD (chronic kidney disease)    Episodic mood disorder (HCC) 11/07/2013   Generalized anxiety disorder    Generalized weakness 07/11/2020   GERD (gastroesophageal reflux disease)    H/O bariatric surgery 06/11/2020   Hypocalcemia 12/10/2015   Hypokalemia    Hyponatremia 02/19/2020   Intestinal malabsorption    Iron deficiency anemia  due to chronic blood loss 05/08/2020   Korsakoff syndrome (HCC)    Lumbar disc disease 10/19/2019   Microscopic hematuria 04/16/2021   Migraine 04/20/2014   Mild recurrent major depression (HCC) 04/19/2020   Multiple closed fractures of ribs of left side 06/03/2020   Formatting of this note might be different from the original. Added automatically from request for surgery [id number]   Neuropathy 01/31/2020   Obsessive-compulsive disorder 11/07/2013   Other osteoporosis without current pathological fracture    Peripheral edema 11/01/2020   Polypharmacy 06/12/2020   Portal hypertension (HCC)    Post-surgical hypothyroidism 12/10/2015   Primary insomnia 03/26/2021   Severe protein-calorie malnutrition (HCC) 03/26/2021   Shortness of breath 11/01/2020   Stage 3b chronic kidney disease (HCC) 04/16/2021   Stroke (HCC)     left basal ganglia stroke (hemorrhagic)   Telogen effluvium 03/26/2021   Ulcer of left foot with fat layer exposed (HCC) 10/04/2020   Ulcer of right foot, with fat layer exposed (HCC) 10/10/2020   Vitamin D deficiency    Past Surgical History:  Procedure Laterality Date   BREAST EXCISIONAL BIOPSY Left    CATARACT EXTRACTION     GASTRIC BYPASS  2005   HEMORRHOID SURGERY     HERNIA REPAIR     metatarsus abductus surgery Left 1990   PARTIAL HYSTERECTOMY  05/1997   BL Ovaries remain. Done for fibroids.   THYROIDECTOMY  03/2013    Family History  Problem Relation Age of Onset   Breast cancer Mother    Osteoporosis Mother    Cancer Mother        Breast, lung, skin.    Breast cancer Maternal Grandmother    Social History   Socioeconomic History   Marital status: Married    Spouse name: Not on file   Number of children: 1   Years of education: Not on file   Highest education level: Associate degree: academic program  Occupational History   Occupation: IT trainer    Comment: Retired.  Tobacco Use   Smoking status: Never   Smokeless tobacco: Never  Substance and Sexual Activity    Alcohol use: Not Currently    Comment: history of alcoholism. sober since 2014.   Drug use: Never   Sexual activity: Yes  Other Topics Concern   Not on file  Social History Narrative   Right handed   Social Determinants of Health   Financial Resource Strain: Low Risk  (02/25/2023)   Overall Financial Resource Strain (CARDIA)    Difficulty of Paying Living Expenses: Not hard at all  Food Insecurity: No Food Insecurity (02/25/2023)   Hunger Vital Sign    Worried About Running Out of Food in the Last Year: Never true    Ran Out of Food in the Last Year: Never true  Transportation Needs: Unmet Transportation Needs (02/25/2023)   PRAPARE - Transportation    Lack of Transportation (Medical): Yes    Lack of Transportation (Non-Medical): Yes  Physical Activity: Inactive (02/25/2023)   Exercise Vital Sign    Days of Exercise per Week: 0 days    Minutes of Exercise per Session: 20 min  Stress: Stress Concern Present (02/25/2023)   [employer] of Occupational Health - Occupational Stress Questionnaire    Feeling of Stress : Rather much  Social Connections: Moderately Isolated (02/25/2023)   Social Connection and Isolation Panel [NHANES]    Frequency of Communication with Friends and Family: More than three times a week    Frequency of Social Gatherings with Friends and Family: Three times a week    Attends Religious Services: Never    Active Member of Clubs or Organizations: No    Attends Engineer, structural: Never    Marital Status: Married    Objective:  BP 104/60   Pulse 78   Temp (!) 97.2 F (36.2 C)   Resp 18   Ht 5\' 6"  (1.676 m)   Wt 159 lb (72.1 kg)   BMI 25.66 kg/m      03/01/2023    3:29 PM 02/02/2023   11:55 AM 11/30/2022    2:34 PM  BP/Weight  Systolic BP 104 115 111  Diastolic BP 60 66 76  Wt. (Lbs) 159 161.8 166.04  BMI 25.66 kg/m2 26.12 kg/m2 26.8 kg/m2    Physical Exam Vitals reviewed.  Constitutional:      Appearance: Normal appearance. She is  normal weight.  Neck:     Vascular: No carotid bruit.  Cardiovascular:     Rate and Rhythm: Normal rate and regular rhythm.     Heart sounds: Murmur (large) heard.  Pulmonary:     Effort: Pulmonary effort is normal. No respiratory distress.     Breath sounds: Normal breath sounds.  Abdominal:     General: Abdomen is flat. Bowel sounds are normal.     Palpations: Abdomen is soft.     Tenderness: There is no abdominal tenderness.  Musculoskeletal:     Comments: Right shoulder in sling.  Right foot in walking boot.  Neurological:     Mental Status: She is alert and oriented to person, place, and time.  Psychiatric:        Behavior: Behavior normal.     Diabetic Foot Exam - Simple   No data filed      Lab Results  Component Value Date   WBC 9.7 02/19/2023   HGB 9.8 (A) 02/19/2023   HCT 30 (A) 02/19/2023  PLT 392 02/19/2023   GLUCOSE 119 (H) 09/18/2022   CHOL 141 10/22/2021   TRIG 51 10/22/2021   HDL 71 10/22/2021   LDLCALC 59 10/22/2021   ALT 27 09/18/2022   AST 35 09/18/2022   NA 138 09/18/2022   K 4.2 09/18/2022   CL 93 (L) 09/18/2022   CREATININE 1.50 (H) 09/18/2022   BUN 44 (H) 09/18/2022   CO2 24 09/18/2022   TSH 0.190 (L) 09/18/2022      Assessment & Plan:    Anemia of chronic disease Assessment & Plan: Management per specialist.  Dr. Melvyn Neth. Recent hb improved.   Orders: -     CBC with Differential/Platelet  Hypocalcemia Assessment & Plan: Check cmp  Orders: -     CMP14+EGFR  Post-surgical hypothyroidism Assessment & Plan: Check tsh, free t4.  Orders: -     Lipid panel -     TSH  Alcoholic cirrhosis of liver without ascites (HCC) Assessment & Plan: Management per hepatologist. Sober for greater than 10 years.    Alcohol dependence in remission [facility]) Assessment & Plan: Continues to be sober.   Korsakoff syndrome (HCC) Assessment & Plan: Stable.    Portal hypertension (HCC) Assessment & Plan: Continue  propranolol.   Ulcer of left foot with fat layer exposed [facility]) Assessment & Plan: Continue see podiatry   Severe protein-calorie malnutrition (HCC) Assessment & Plan: Secondary to gastric bypass.  Secondary to liver cirrhosis.   Bipolar affective disorder, currently depressed, moderate (HCC) Assessment & Plan: Management per psychiatry Continue Lamictal, Zoloft, quetiapine, and Xanax.   Frequent falls Assessment & Plan: Recommend discuss quad cane with podiatry when she sees them in 3-4 days      No orders of the defined types were placed in this encounter.   Orders Placed This Encounter  Procedures   CBC with Differential/Platelet   CMP14+EGFR   Lipid panel   TSH     Follow-up: Return in about 3 months (around 06/01/2023) for chronic follow up.   I,Carolyn M Morrison,acting as a Neurosurgeon for Blane Ohara, MD.,have documented all relevant documentation on the behalf of Blane Ohara, MD,as directed by  Blane Ohara, MD while in the presence of Blane Ohara, MD.   An After Visit Summary was printed and given to the patient.  Blane Ohara, MD  Family Practice [phone]

## 2023-03-01 NOTE — Assessment & Plan Note (Signed)
Stable

## 2023-03-01 NOTE — Assessment & Plan Note (Signed)
Management per hepatologist. Molly Parrish for greater than 10 years.

## 2023-03-01 NOTE — Assessment & Plan Note (Signed)
-  Continue propranolol 

## 2023-03-01 NOTE — Assessment & Plan Note (Signed)
Continue see podiatry

## 2023-03-01 NOTE — Assessment & Plan Note (Signed)
Continues to be sober.

## 2023-03-02 ENCOUNTER — Inpatient Hospital Stay: Payer: 59 | Attending: Oncology

## 2023-03-02 ENCOUNTER — Inpatient Hospital Stay (HOSPITAL_BASED_OUTPATIENT_CLINIC_OR_DEPARTMENT_OTHER): Payer: 59 | Admitting: Oncology

## 2023-03-02 ENCOUNTER — Encounter: Payer: Self-pay | Admitting: Oncology

## 2023-03-02 ENCOUNTER — Encounter: Payer: Self-pay | Admitting: Hematology and Oncology

## 2023-03-02 ENCOUNTER — Other Ambulatory Visit: Payer: Self-pay | Admitting: Oncology

## 2023-03-02 VITALS — BP 98/55 | HR 77 | Temp 98.2°F | Resp 16 | Ht 66.0 in

## 2023-03-02 DIAGNOSIS — D631 Anemia in chronic kidney disease: Secondary | ICD-10-CM | POA: Diagnosis not present

## 2023-03-02 DIAGNOSIS — D509 Iron deficiency anemia, unspecified: Secondary | ICD-10-CM | POA: Insufficient documentation

## 2023-03-02 DIAGNOSIS — N189 Chronic kidney disease, unspecified: Secondary | ICD-10-CM | POA: Diagnosis not present

## 2023-03-02 DIAGNOSIS — D72829 Elevated white blood cell count, unspecified: Secondary | ICD-10-CM | POA: Insufficient documentation

## 2023-03-02 DIAGNOSIS — D649 Anemia, unspecified: Secondary | ICD-10-CM

## 2023-03-02 DIAGNOSIS — Z79899 Other long term (current) drug therapy: Secondary | ICD-10-CM | POA: Insufficient documentation

## 2023-03-02 DIAGNOSIS — D638 Anemia in other chronic diseases classified elsewhere: Secondary | ICD-10-CM

## 2023-03-02 LAB — CBC: RBC: 3.33 — AB (ref 3.87–5.11)

## 2023-03-02 LAB — CBC AND DIFFERENTIAL
HCT: 30 — AB (ref 36–46)
Hemoglobin: 9.7 — AB (ref 12.0–16.0)
Neutrophils Absolute: 6.38
Platelets: 333 10*3/uL (ref 150–400)
WBC: 8.4

## 2023-03-03 ENCOUNTER — Telehealth: Payer: Self-pay | Admitting: Oncology

## 2023-03-03 ENCOUNTER — Other Ambulatory Visit: Payer: Self-pay | Admitting: Family Medicine

## 2023-03-03 ENCOUNTER — Other Ambulatory Visit: Payer: Self-pay

## 2023-03-03 ENCOUNTER — Encounter (HOSPITAL_COMMUNITY): Payer: Self-pay | Admitting: Oncology

## 2023-03-03 MED ORDER — LEVOTHYROXINE SODIUM 137 MCG PO TABS: 150.0000 ug | ORAL_TABLET | Freq: Every day | ORAL | 0 refills | Status: DC

## 2023-03-03 NOTE — Telephone Encounter (Signed)
Contacted pt to schedule an appt. Unable to reach via phone, voicemail box is not set up.   Scheduling Message Entered by Rennis Harding A on 03/02/2023 at  6:12 PM Priority: Routine <No visit type provided>  Department: [facility]  Provider:  Scheduling Notes:  Retacrit every 4 weeks, starting on 03-05-23  CBC every 4 weeks  Labs/appt 05-28-23

## 2023-03-04 ENCOUNTER — Ambulatory Visit: Payer: 59

## 2023-03-09 ENCOUNTER — Telehealth: Payer: Self-pay

## 2023-03-09 NOTE — Telephone Encounter (Signed)
PA submitted and approved via covermymeds for amitiza.

## 2023-03-12 ENCOUNTER — Encounter: Payer: Self-pay | Admitting: Oncology

## 2023-03-17 ENCOUNTER — Other Ambulatory Visit: Payer: Self-pay | Admitting: Family Medicine

## 2023-03-18 ENCOUNTER — Inpatient Hospital Stay: Payer: 59

## 2023-03-18 DIAGNOSIS — Z79899 Other long term (current) drug therapy: Secondary | ICD-10-CM | POA: Diagnosis not present

## 2023-03-18 DIAGNOSIS — D649 Anemia, unspecified: Secondary | ICD-10-CM

## 2023-03-18 DIAGNOSIS — D509 Iron deficiency anemia, unspecified: Secondary | ICD-10-CM | POA: Diagnosis not present

## 2023-03-18 DIAGNOSIS — D72829 Elevated white blood cell count, unspecified: Secondary | ICD-10-CM | POA: Diagnosis present

## 2023-03-18 LAB — CBC WITH DIFFERENTIAL (CANCER CENTER ONLY)
Abs Immature Granulocytes: 0.06 10*3/uL (ref 0.00–0.07)
Basophils Absolute: 0.1 10*3/uL (ref 0.0–0.1)
Basophils Relative: 1 %
Eosinophils Absolute: 0.2 10*3/uL (ref 0.0–0.5)
Eosinophils Relative: 2 %
HCT: 33.1 % — ABNORMAL LOW (ref 36.0–46.0)
Hemoglobin: 10 g/dL — ABNORMAL LOW (ref 12.0–15.0)
Immature Granulocytes: 1 %
Lymphocytes Relative: 17 %
Lymphs Abs: 2 10*3/uL (ref 0.7–4.0)
MCH: 28.1 pg (ref 26.0–34.0)
MCHC: 30.2 g/dL (ref 30.0–36.0)
MCV: 93 fL (ref 80.0–100.0)
Monocytes Absolute: 0.7 10*3/uL (ref 0.1–1.0)
Monocytes Relative: 6 %
Neutro Abs: 8.4 10*3/uL — ABNORMAL HIGH (ref 1.7–7.7)
Neutrophils Relative %: 73 %
Platelet Count: 392 10*3/uL (ref 150–400)
RBC: 3.56 MIL/uL — ABNORMAL LOW (ref 3.87–5.11)
RDW: 16.2 % — ABNORMAL HIGH (ref 11.5–15.5)
WBC Count: 11.3 10*3/uL — ABNORMAL HIGH (ref 4.0–10.5)
nRBC: 0 % (ref 0.0–0.2)

## 2023-03-19 ENCOUNTER — Encounter: Payer: Self-pay | Admitting: Oncology

## 2023-03-20 ENCOUNTER — Other Ambulatory Visit: Payer: Self-pay | Admitting: Family Medicine

## 2023-03-22 ENCOUNTER — Other Ambulatory Visit: Payer: Self-pay | Admitting: Family Medicine

## 2023-03-23 ENCOUNTER — Inpatient Hospital Stay: Payer: 59 | Attending: Oncology

## 2023-03-23 ENCOUNTER — Other Ambulatory Visit: Payer: Self-pay | Admitting: Family Medicine

## 2023-03-23 VITALS — BP 107/54 | HR 89 | Temp 98.2°F | Resp 18 | Ht 66.0 in | Wt 158.0 lb

## 2023-03-23 DIAGNOSIS — Z79899 Other long term (current) drug therapy: Secondary | ICD-10-CM | POA: Insufficient documentation

## 2023-03-23 DIAGNOSIS — D72829 Elevated white blood cell count, unspecified: Secondary | ICD-10-CM | POA: Insufficient documentation

## 2023-03-23 DIAGNOSIS — D631 Anemia in chronic kidney disease: Secondary | ICD-10-CM

## 2023-03-23 MED ORDER — EPOETIN ALFA-EPBX 40000 UNIT/ML IJ SOLN
40000.0000 [IU] | Freq: Once | INTRAMUSCULAR | Status: AC
  Administered 2023-03-23: 40000 [IU] via SUBCUTANEOUS
  Filled 2023-03-23: qty 1

## 2023-03-23 MED ORDER — VITAMIN D (ERGOCALCIFEROL) 1.25 MG (50000 UNIT) PO CAPS: 50000.0000 [IU] | ORAL_CAPSULE | ORAL | 0 refills | Status: DC

## 2023-03-23 NOTE — Patient Instructions (Signed)

## 2023-03-26 ENCOUNTER — Other Ambulatory Visit: Payer: Self-pay | Admitting: Family Medicine

## 2023-03-30 ENCOUNTER — Other Ambulatory Visit: Payer: Self-pay | Admitting: Family Medicine

## 2023-04-02 ENCOUNTER — Other Ambulatory Visit: Payer: 59

## 2023-04-05 ENCOUNTER — Ambulatory Visit: Payer: 59

## 2023-04-12 ENCOUNTER — Other Ambulatory Visit: Payer: Self-pay | Admitting: Family Medicine

## 2023-04-15 ENCOUNTER — Inpatient Hospital Stay: Payer: 59

## 2023-04-15 ENCOUNTER — Other Ambulatory Visit: Payer: 59

## 2023-04-15 DIAGNOSIS — D72829 Elevated white blood cell count, unspecified: Secondary | ICD-10-CM | POA: Diagnosis not present

## 2023-04-15 DIAGNOSIS — D649 Anemia, unspecified: Secondary | ICD-10-CM

## 2023-04-15 LAB — CBC WITH DIFFERENTIAL (CANCER CENTER ONLY)
Abs Immature Granulocytes: 0.11 10*3/uL — ABNORMAL HIGH (ref 0.00–0.07)
Basophils Absolute: 0.1 10*3/uL (ref 0.0–0.1)
Basophils Relative: 1 %
Eosinophils Absolute: 0.3 10*3/uL (ref 0.0–0.5)
Eosinophils Relative: 2 %
HCT: 31 % — ABNORMAL LOW (ref 36.0–46.0)
Hemoglobin: 9.7 g/dL — ABNORMAL LOW (ref 12.0–15.0)
Immature Granulocytes: 1 %
Lymphocytes Relative: 11 %
Lymphs Abs: 1.7 10*3/uL (ref 0.7–4.0)
MCH: 32.1 pg (ref 26.0–34.0)
MCHC: 31.3 g/dL (ref 30.0–36.0)
MCV: 102.6 fL — ABNORMAL HIGH (ref 80.0–100.0)
Monocytes Absolute: 0.8 10*3/uL (ref 0.1–1.0)
Monocytes Relative: 5 %
Neutro Abs: 12.8 10*3/uL — ABNORMAL HIGH (ref 1.7–7.7)
Neutrophils Relative %: 80 %
Platelet Count: 423 10*3/uL — ABNORMAL HIGH (ref 150–400)
RBC: 3.02 MIL/uL — ABNORMAL LOW (ref 3.87–5.11)
RDW: 15.8 % — ABNORMAL HIGH (ref 11.5–15.5)
WBC Count: 15.8 10*3/uL — ABNORMAL HIGH (ref 4.0–10.5)
nRBC: 0 % (ref 0.0–0.2)

## 2023-04-16 ENCOUNTER — Ambulatory Visit (INDEPENDENT_AMBULATORY_CARE_PROVIDER_SITE_OTHER): Payer: 59 | Admitting: Infectious Diseases

## 2023-04-16 ENCOUNTER — Other Ambulatory Visit: Payer: Self-pay | Admitting: Family Medicine

## 2023-04-16 ENCOUNTER — Other Ambulatory Visit: Payer: Self-pay

## 2023-04-16 ENCOUNTER — Encounter: Payer: Self-pay | Admitting: Infectious Diseases

## 2023-04-16 ENCOUNTER — Telehealth: Payer: Self-pay

## 2023-04-16 VITALS — BP 108/73 | HR 90 | Temp 98.0°F | Wt 158.0 lb

## 2023-04-16 DIAGNOSIS — T8459XA Infection and inflammatory reaction due to other internal joint prosthesis, initial encounter: Secondary | ICD-10-CM

## 2023-04-16 DIAGNOSIS — Z452 Encounter for adjustment and management of vascular access device: Secondary | ICD-10-CM | POA: Insufficient documentation

## 2023-04-16 DIAGNOSIS — Z79899 Other long term (current) drug therapy: Secondary | ICD-10-CM | POA: Insufficient documentation

## 2023-04-16 DIAGNOSIS — M869 Osteomyelitis, unspecified: Secondary | ICD-10-CM | POA: Insufficient documentation

## 2023-04-16 DIAGNOSIS — K7030 Alcoholic cirrhosis of liver without ascites: Secondary | ICD-10-CM

## 2023-04-16 DIAGNOSIS — T847XXA Infection and inflammatory reaction due to other internal orthopedic prosthetic devices, implants and grafts, initial encounter: Secondary | ICD-10-CM | POA: Insufficient documentation

## 2023-04-16 NOTE — Progress Notes (Addendum)
Patient Active Problem List   Diagnosis Date Noted   Frequent falls 03/01/2023   Hypomagnesemia 06/15/2022   Dyspnea on exertion 03/06/2022   Heart murmur 03/06/2022   Therapeutic opioid induced constipation 02/04/2022   Chronic idiopathic constipation 01/30/2022   Hyperkalemia 01/30/2022   History of cardiac arrhythmia 01/30/2022   Generalized edema 01/12/2022   Muscle cramps 01/11/2022   Weight gain 01/11/2022   History of sepsis 12/13/2021   Urinary urgency 11/24/2021   Hyperphosphatemia 08/13/2021   Cavus deformity of right foot 07/07/2021   Alcoholic peripheral neuropathy (HCC) 07/07/2021   Metatarsus adductus of right foot 07/07/2021   Encounter for screening mammogram for malignant neoplasm of breast 07/06/2021   Stage 3b chronic kidney disease (HCC) 04/16/2021   Primary insomnia 03/26/2021   Telogen effluvium 03/26/2021   Severe protein-calorie malnutrition (HCC) 03/26/2021   Vitamin D deficiency    GERD (gastroesophageal reflux disease)    CKD (chronic kidney disease)    Bipolar disorder current episode depressed (HCC)    Anemia in chronic kidney disease    Peripheral edema 11/01/2020   Shortness of breath 11/01/2020   Ulcer of right foot, with fat layer exposed (HCC) 10/10/2020   Ulcer of left foot with fat layer exposed (HCC) 10/04/2020   Status post foot surgery 06/11/2020   Multiple closed fractures of ribs of left side 06/03/2020   Iron deficiency anemia due to chronic blood loss 05/08/2020   Arterial hypotension 05/08/2020   Leucocytosis 04/21/2020   Mild recurrent major depression (HCC) 04/19/2020   Generalized anxiety disorder 04/19/2020   Hyponatremia 02/19/2020   Lumbar disc disease 10/19/2019   Korsakoff syndrome (HCC)    Localized osteoporosis without current pathological fracture    Portal hypertension (HCC)    Alcoholic cirrhosis of liver (HCC)    Anemia of chronic disease 08/08/2016   Hypocalcemia 12/10/2015   Post-surgical  hypothyroidism 12/10/2015   Migraine 04/20/2014   Stroke (HCC) 04/20/2014   Obsessive-compulsive disorder 11/07/2013   Alcohol dependence in remission (HCC) 10/04/2013    Patient's Medications  New Prescriptions   No medications on file  Previous Medications   ACETAMINOPHEN (TYLENOL) 325 MG TABLET    Take by mouth.   ALPRAZOLAM (XANAX) 0.25 MG TABLET    TAKE 1 TABLET(0.25 MG) BY MOUTH TWICE DAILY   BUDESONIDE-FORMOTEROL (SYMBICORT) 160-4.5 MCG/ACT INHALER    Inhale 2 puffs into the lungs 2 (two) times daily.   CALCITRIOL (ROCALTROL) 0.5 MCG CAPSULE    Take 0.5 mcg by mouth 2 (two) times daily.   CALCIUM CARBONATE (OSCAL) 1500 (600 CA) MG TABS TABLET    Take 3,600 mg by mouth 2 (two) times daily with a meal.   CYANOCOBALAMIN (VITAMIN B 12 PO)    Take 1,000 mcg by mouth daily.   CYCLOBENZAPRINE (FLEXERIL) 10 MG TABLET    TAKE 1 TABLET(10 MG) BY MOUTH THREE TIMES DAILY AS NEEDED FOR MUSCLE SPASMS   FAMOTIDINE (PEPCID) 40 MG TABLET    Take 40 mg by mouth at bedtime.   FUROSEMIDE (LASIX) 40 MG TABLET    Take 2 tablets (80 mg total) by mouth 3 (three) times daily between meals.   GABAPENTIN (NEURONTIN) 100 MG CAPSULE    TAKE 1 CAPSULE(100 MG) BY MOUTH THREE TIMES DAILY   GLUCAGON (GVOKE HYPOPEN 2-PACK) 0.5 MG/0.1ML SOAJ    If glucose less than 60, use gvoke pen x 1. May repeat if not responding x 1 daily. Call EMS if not improving.  HYDROXYZINE (VISTARIL) 25 MG CAPSULE    TAKE 1 CAPSULE(25 MG) BY MOUTH THREE TIMES DAILY   IRON-VITAMINS (GERITOL COMPLETE) TABS    Take 1 tablet by mouth daily.   LAMOTRIGINE (LAMICTAL) 200 MG TABLET    Take 200 mg by mouth at bedtime.   LEVOTHYROXINE (SYNTHROID) 137 MCG TABLET    Take 1 tablet (137 mcg total) by mouth daily.   LUBIPROSTONE (AMITIZA) 24 MCG CAPSULE    TAKE 1 CAPSULE(24 MCG) BY MOUTH TWICE DAILY WITH A MEAL   MAGNESIUM OXIDE (MAG-OX) 400 MG TABLET    Take 400 mg by mouth daily.   MIRTAZAPINE (REMERON) 15 MG TABLET    Take 7.5-15 mg by mouth at  bedtime.   OMEPRAZOLE (PRILOSEC) 40 MG CAPSULE    Take 40 mg by mouth daily.   ONDANSETRON (ZOFRAN) 4 MG TABLET    TAKE 1 TABLET(4 MG) BY MOUTH EVERY 8 HOURS AS NEEDED FOR NAUSEA OR VOMITING   ONDANSETRON (ZOFRAN-ODT) 4 MG DISINTEGRATING TABLET    Take 4 mg by mouth every 8 (eight) hours as needed.   OXYCODONE (OXY IR/ROXICODONE) 5 MG IMMEDIATE RELEASE TABLET    Take 5 mg by mouth every 6 (six) hours as needed.   PANTOPRAZOLE (PROTONIX) 40 MG TABLET    TAKE 1 TABLET(40 MG) BY MOUTH DAILY   PHENTERMINE (ADIPEX-P) 37.5 MG TABLET    Take 37.5 mg by mouth every morning.   PROMETHAZINE (PHENERGAN) 25 MG TABLET    TAKE 1 TABLET BY MOUTH FOUR TIMES DAILY AS NEEDED FOR NAUSEA OR VOMITING   PROPRANOLOL (INDERAL) 10 MG TABLET    Take 10 mg by mouth at bedtime.   QUETIAPINE (SEROQUEL) 50 MG TABLET    Take 100 mg by mouth at bedtime.   RIFAXIMIN (XIFAXAN) 550 MG TABS TABLET    Take 550 mg by mouth 2 (two) times daily.   SERTRALINE (ZOLOFT) 100 MG TABLET    Take 100 mg by mouth in the morning and at bedtime.   SEVELAMER CARBONATE (RENVELA) 800 MG TABLET    Take 800 mg by mouth 3 (three) times daily with meals.   SPIRONOLACTONE (ALDACTONE) 50 MG TABLET    Take 100 mg by mouth 2 (two) times daily.   THIAMINE 100 MG TABLET    Take 100 mg by mouth daily.   TRAMADOL (ULTRAM) 50 MG TABLET    TAKE 2 TABLETS BY MOUTH TWICE DAILY FOR BACK PAIN   TRIAMCINOLONE (KENALOG) 0.1 % PASTE    USE AS DIRECTED IN THE MOUTH OR THROAT TWICE DAILY.   VITAMIN D, ERGOCALCIFEROL, (DRISDOL) 1.25 MG (50000 UNIT) CAPS CAPSULE    Take 1 capsule (50,000 Units total) by mouth 2 (two) times a week.   ZOLPIDEM (AMBIEN) 10 MG TABLET    TAKE 1 TABLET(10 MG) BY MOUTH AT BEDTIME AS NEEDED FOR SLEEP  Modified Medications   No medications on file  Discontinued Medications   No medications on file    Subjective: 57 year old female with PMH of alcohol abuse, alcoholic liver cirrhosis with portal hypertension, CKD, GAD, GAD/depression, PEM, CKD   who is referred from [facility] for post op wound infection of rt shoulder/osteomyelitis   Patient had ORIF of right distal clavicular fracture type IIa on July 10.  2024.  Patient was doing well until approximately 3 weeks postop 8/11 when she fell onto the right shoulder sustaining a fracture medial to the hook plate without any obvious implant loosening.  This was decided to be treated  conservatively initially until she began having some blisters which appeared to be fracture blisters without evidence of infection initially but later started having increased swelling, redness.  One of the blisters was aspirated 9/11 which  was purulent and  concerning for infection with elevated neutrophilic Wbc count, Cx Normal skin flora. 9/12 she underwent I and D of rt shoulder wound with removal of deep rt shoulder implant. OR findings - evidence of significant healing involving distal 3rd clavicle # with non united # medial to the hook plate. Evidence of early post op wound infection. Implant including hook plate and 6 screws were removed. No significant evidence of osteomyelitis. Patient was started on Iv abtx post op, picc line placed and discharged on PO doxycycline  100mg  po bid and ceftriaxone 2 g iv daily  I called [facility] regarding cx results 9/11 chest wound cx Normal skin flora 9/12 OR cx, 2 samples - no growth in both samples  Denies smoking, reports being sober from alcohol for several years now. Denies IVDU.    Review of Systems: denies fevers, chills, sweats. Mild nausea, vomiting with abtx but denies diarrhea, abdominal pain. Denies GU symptoms or rashes. Denies pain, tenderness or swelling in rt shoulder or clavicular area.   Past Medical History:  Diagnosis Date   Alcohol dependence in remission (HCC) 10/04/2013   Alcoholic cirrhosis of liver (HCC)    Anemia    Anemia of chronic disease 08/08/2016   Arterial hypotension 05/08/2020   Bipolar disorder current episode depressed (HCC)     Brain TIA 02/19/2020   CKD (chronic kidney disease)    Episodic mood disorder (HCC) 11/07/2013   Generalized anxiety disorder    Generalized weakness 07/11/2020   GERD (gastroesophageal reflux disease)    H/O bariatric surgery 06/11/2020   Hypocalcemia 12/10/2015   Hypokalemia    Hyponatremia 02/19/2020   Intestinal malabsorption    Iron deficiency anemia due to chronic blood loss 05/08/2020   Korsakoff syndrome (HCC)    Lumbar disc disease 10/19/2019   Microscopic hematuria 04/16/2021   Migraine 04/20/2014   Mild recurrent major depression (HCC) 04/19/2020   Multiple closed fractures of ribs of left side 06/03/2020   Formatting of this note might be different from the original. Added automatically from request for surgery [id number]   Neuropathy 01/31/2020   Obsessive-compulsive disorder 11/07/2013   Other osteoporosis without current pathological fracture    Peripheral edema 11/01/2020   Polypharmacy 06/12/2020   Portal hypertension (HCC)    Post-surgical hypothyroidism 12/10/2015   Primary insomnia 03/26/2021   Severe protein-calorie malnutrition (HCC) 03/26/2021   Shortness of breath 11/01/2020   Stage 3b chronic kidney disease (HCC) 04/16/2021   Stroke (HCC)     left basal ganglia stroke (hemorrhagic)   Telogen effluvium 03/26/2021   Ulcer of left foot with fat layer exposed (HCC) 10/04/2020   Ulcer of right foot, with fat layer exposed (HCC) 10/10/2020   Vitamin D deficiency    Past Surgical History:  Procedure Laterality Date   BREAST EXCISIONAL BIOPSY Left    CATARACT EXTRACTION     GASTRIC BYPASS  2005   HEMORRHOID SURGERY     HERNIA REPAIR     metatarsus abductus surgery Left 1990   PARTIAL HYSTERECTOMY  05/1997   BL Ovaries remain. Done for fibroids.   THYROIDECTOMY  03/2013    Social History   Tobacco Use   Smoking status: Never   Smokeless tobacco: Never  Substance Use Topics   Alcohol use: Not  Currently    Comment: history of alcoholism. sober since 2014.   Drug use: Never     Family History  Problem Relation Age of Onset   Breast cancer Mother    Osteoporosis Mother    Cancer Mother        Breast, lung, skin.    Breast cancer Maternal Grandmother     Allergies  Allergen Reactions   Cholestyramine     Rash and itching   Trintellix [Vortioxetine]     Health Maintenance  Topic Date Due   Medicare Annual Wellness (AWV)  Never done   Zoster Vaccines- Shingrix (2 of 2) 10/21/2020   DTaP/Tdap/Td (2 - Td or Tdap) 10/19/2022   INFLUENZA VACCINE  02/18/2023   COVID-19 Vaccine (6 - 2023-24 season) 03/21/2023   MAMMOGRAM  09/18/2023   Colonoscopy  03/13/2030   HPV VACCINES  Aged Out   Hepatitis C Screening  Discontinued   HIV Screening  Discontinued    Objective: BP 108/73   Pulse 90   Temp 98 F (36.7 C) (Oral)   Wt 158 lb (71.7 kg)   SpO2 95%   BMI 25.50 kg/m    Physical Exam Constitutional:      Appearance: Normal appearance.  HENT:     Head: Normocephalic and atraumatic.      Mouth: Mucous membranes are moist.  Eyes:    Conjunctiva/sclera: Conjunctivae normal.     Pupils: Pupils are equal, round, and bilaterally symmetrical    Cardiovascular:     Rate and Rhythm: Normal rate and regular rhythm.     Heart sounds: s1s2  Pulmonary:     Effort: Pulmonary effort is normal.     Breath sounds: Normal breath sounds.   Abdominal:     General: Non distended     Palpations: soft.   Musculoskeletal:        General: Normal range of motion. Rt shoulder wound - healing with no signs of infection. Good ROM in the rt shoulder.   Skin:    General: Skin is warm and dry.     Comments:  Neurological:     General: grossly non focal     Mental Status: awake, alert and oriented to person, place, and time.   Psychiatric:        Mood and Affect: Mood normal.   Lab Results Lab Results  Component Value Date   WBC 15.8 (H) 04/15/2023   HGB 9.7 (L) 04/15/2023   HCT 31.0 (L) 04/15/2023   MCV 102.6 (H) 04/15/2023   PLT 423 (H) 04/15/2023     Lab Results  Component Value Date   CREATININE 1.45 (H) 03/01/2023   BUN 36 (H) 03/01/2023   NA 135 03/01/2023   K 4.1 03/01/2023   CL 96 03/01/2023   CO2 25 03/01/2023    Lab Results  Component Value Date   ALT 15 03/01/2023   AST 24 03/01/2023   ALKPHOS 210 (H) 03/01/2023   BILITOT 0.3 03/01/2023    Lab Results  Component Value Date   CHOL 121 03/01/2023   HDL 68 03/01/2023   LDLCALC 39 03/01/2023   TRIG 65 03/01/2023   CHOLHDL 1.8 03/01/2023   No results found for: "LABRPR", "RPRTITER" No results found for: "HIV1RNAQUANT", "HIV1RNAVL", "CD4TABS"              Assessment/Plan # Post op rt shoulder wound infection complicated with hardware  # Probable osteomyelitis   Start daptomycin 600mg  IV daily. Continue doxycycline until IV daptomycin  set up Continue ceftriaxone 2g iv daily  OPAT placed  Labs today  Fu in 3 weeks  Fu with Orthopedics as instructed  # PICC  No concerns   # Alcoholic liver cirrhosis Reports she follows GI doctor at [facility]   I have personally spent 75  minutes involved in face-to-face and non-face-to-face activities for this patient on the day of the visit. Professional time spent includes the following activities: Preparing to see the patient (review of tests), Obtaining and/or reviewing separately obtained history (admission/discharge record), Performing a medically appropriate examination and/or evaluation , Ordering medications/tests/procedures, referring and communicating with other health care professionals, Documenting clinical information in the EMR, Independently interpreting results (not separately reported), Communicating results to the patient/family/caregiver, Counseling and educating the patient/family/caregiver and Care coordination (not separately reported).   Victoriano Lain, MD [facility] 04/16/2023, 7:11 AM

## 2023-04-16 NOTE — Telephone Encounter (Signed)
Per Dr. Elinor Parkinson patient will need Daptomycine with IV ceftriaxone. Patient is currently with [facility]; HH is not able to do first dose and is requiring pt get setup at infusion clinic for this.  Will need first dose orders signed by [facility] provider. Faxed order to referring provider Maceo Pro, PA. Spoke with his nurse who will keep an eye out for order and have provider there sign and fax it back to office.  Once received will send to [facility] infusion clinic to schedule first dose. Will update [facility] with appt.  Pam with [facility] has been updated and waiting on opat.  Maceo Pro, [place]  Phone [phone] Fax [phone]  [facility] [phone] F: [phone]   [facility] F: [phone] Marcelino Duster)  Juanita Laster, [place]

## 2023-04-16 NOTE — Progress Notes (Signed)
Diagnosis: post op wound infection/osteomyelitis   Culture Result: No + cultures   Allergies  Allergen Reactions   Cholestyramine     Rash and itching   Trintellix [Vortioxetine]     OPAT Orders Discharge antibiotics to be given via PICC line Discharge antibiotics: daptomycin 600mg  iv daily and ceftriaxone 2g iv daily Per pharmacy protocol  Duration: 6 weeks End Date: Daptomycin ( EOT 6 weeks from start date), ceftriaxone 2g  IV daily ( EOT 05/13/23)  PIC Care Per Protocol:  Home health RN for IV administration and teaching; PICC line care and labs.    Labs weekly while on IV antibiotics: X__ CBC with differential __ BMP X__ CMP X__ CRP X__ ESR __ Vancomycin trough X__ CK  X__ Please pull PIC at completion of IV antibiotics __ Please leave PIC in place until doctor has seen patient or been notified  Fax weekly labs to [phone]  Clinic Follow Up Appt: Fu in 3 weeks   Odette Fraction, MD Infectious Disease Physician [facility] [address] [place], [place] [address] Phone: [phone]  Fax: [phone]

## 2023-04-17 LAB — CBC
HCT: 31.6 % — ABNORMAL LOW (ref 35.0–45.0)
Hemoglobin: 9.9 g/dL — ABNORMAL LOW (ref 11.7–15.5)
MCH: 31.4 pg (ref 27.0–33.0)
MCHC: 31.3 g/dL — ABNORMAL LOW (ref 32.0–36.0)
MCV: 100.3 fL — ABNORMAL HIGH (ref 80.0–100.0)
MPV: 9.1 fL (ref 7.5–12.5)
Platelets: 398 10*3/uL (ref 140–400)
RBC: 3.15 10*6/uL — ABNORMAL LOW (ref 3.80–5.10)
RDW: 13.4 % (ref 11.0–15.0)
WBC: 18.3 10*3/uL — ABNORMAL HIGH (ref 3.8–10.8)

## 2023-04-17 LAB — COMPREHENSIVE METABOLIC PANEL
AG Ratio: 1.2 (calc) (ref 1.0–2.5)
ALT: 18 U/L (ref 6–29)
AST: 21 U/L (ref 10–35)
Albumin: 3.2 g/dL — ABNORMAL LOW (ref 3.6–5.1)
Alkaline phosphatase (APISO): 139 U/L (ref 37–153)
BUN/Creatinine Ratio: 30 (calc) — ABNORMAL HIGH (ref 6–22)
BUN: 44 mg/dL — ABNORMAL HIGH (ref 7–25)
CO2: 33 mmol/L — ABNORMAL HIGH (ref 20–32)
Calcium: 9.3 mg/dL (ref 8.6–10.4)
Chloride: 91 mmol/L — ABNORMAL LOW (ref 98–110)
Creat: 1.45 mg/dL — ABNORMAL HIGH (ref 0.50–1.03)
Globulin: 2.6 g/dL (ref 1.9–3.7)
Glucose, Bld: 82 mg/dL (ref 65–99)
Potassium: 3.8 mmol/L (ref 3.5–5.3)
Sodium: 134 mmol/L — ABNORMAL LOW (ref 135–146)
Total Bilirubin: 0.3 mg/dL (ref 0.2–1.2)
Total Protein: 5.8 g/dL — ABNORMAL LOW (ref 6.1–8.1)

## 2023-04-17 LAB — C-REACTIVE PROTEIN: CRP: 6.2 mg/L (ref <8.0)

## 2023-04-17 LAB — SEDIMENTATION RATE: Sed Rate: 36 mm/h — ABNORMAL HIGH (ref 0–30)

## 2023-04-19 ENCOUNTER — Telehealth: Payer: Self-pay

## 2023-04-19 ENCOUNTER — Other Ambulatory Visit: Payer: Self-pay

## 2023-04-19 MED ORDER — ALPRAZOLAM 0.25 MG PO TABS: 0.2500 mg | ORAL_TABLET | Freq: Two times a day (BID) | ORAL | 1 refills | Status: DC | PRN

## 2023-04-19 NOTE — Telephone Encounter (Signed)
Received call from [employer] with [employer] stating they can do first dose this afternoon.  Left voicemail with the nurse for [place], [place] stating we no longer needed order from them.  Patient is aware of plan to get first dose today.  Juanita Laster, RMA

## 2023-04-19 NOTE — Telephone Encounter (Signed)
Per Darl Pikes Phy:  The antibiotics won't affect the retacrit but we can push it out if the patient needs time to recover from her infection since her hemoglobin wasn't that bad.   Relayed information to patient and she is going to keep appointment on 04/21/2023.

## 2023-04-20 ENCOUNTER — Encounter: Payer: Self-pay | Admitting: Oncology

## 2023-04-21 ENCOUNTER — Inpatient Hospital Stay: Payer: Medicare Other | Attending: Oncology

## 2023-04-21 ENCOUNTER — Inpatient Hospital Stay: Payer: Medicare Other

## 2023-04-21 VITALS — BP 102/67 | HR 86 | Temp 98.2°F | Resp 18

## 2023-04-21 DIAGNOSIS — R5383 Other fatigue: Secondary | ICD-10-CM | POA: Insufficient documentation

## 2023-04-21 DIAGNOSIS — Z79899 Other long term (current) drug therapy: Secondary | ICD-10-CM | POA: Insufficient documentation

## 2023-04-21 DIAGNOSIS — D631 Anemia in chronic kidney disease: Secondary | ICD-10-CM | POA: Insufficient documentation

## 2023-04-21 DIAGNOSIS — D72829 Elevated white blood cell count, unspecified: Secondary | ICD-10-CM | POA: Diagnosis present

## 2023-04-21 DIAGNOSIS — N1832 Chronic kidney disease, stage 3b: Secondary | ICD-10-CM | POA: Insufficient documentation

## 2023-04-21 MED ORDER — EPOETIN ALFA-EPBX 40000 UNIT/ML IJ SOLN
40000.0000 [IU] | Freq: Once | INTRAMUSCULAR | Status: AC
  Administered 2023-04-21: 40000 [IU] via SUBCUTANEOUS
  Filled 2023-04-21: qty 1

## 2023-04-21 NOTE — Patient Instructions (Signed)

## 2023-04-22 LAB — LAB REPORT - SCANNED: EGFR: 46

## 2023-04-23 ENCOUNTER — Other Ambulatory Visit: Payer: 59

## 2023-04-26 ENCOUNTER — Other Ambulatory Visit: Payer: 59

## 2023-04-26 DIAGNOSIS — D649 Anemia, unspecified: Secondary | ICD-10-CM

## 2023-04-27 LAB — CBC WITH DIFFERENTIAL/PLATELET
Basophils Absolute: 0.1 10*3/uL (ref 0.0–0.2)
Basos: 1 %
EOS (ABSOLUTE): 0.3 10*3/uL (ref 0.0–0.4)
Eos: 2 %
Hematocrit: 36 % (ref 34.0–46.6)
Hemoglobin: 10.9 g/dL — ABNORMAL LOW (ref 11.1–15.9)
Immature Grans (Abs): 0 10*3/uL (ref 0.0–0.1)
Immature Granulocytes: 0 %
Lymphocytes Absolute: 1 10*3/uL (ref 0.7–3.1)
Lymphs: 6 %
MCH: 31.2 pg (ref 26.6–33.0)
MCHC: 30.3 g/dL — ABNORMAL LOW (ref 31.5–35.7)
MCV: 103 fL — ABNORMAL HIGH (ref 79–97)
Monocytes Absolute: 0.8 10*3/uL (ref 0.1–0.9)
Monocytes: 5 %
Neutrophils Absolute: 14.4 10*3/uL — ABNORMAL HIGH (ref 1.4–7.0)
Neutrophils: 86 %
Platelets: 435 10*3/uL (ref 150–450)
RBC: 3.49 x10E6/uL — ABNORMAL LOW (ref 3.77–5.28)
RDW: 13.2 % (ref 11.7–15.4)
WBC: 16.6 10*3/uL — ABNORMAL HIGH (ref 3.4–10.8)

## 2023-04-27 LAB — COMPREHENSIVE METABOLIC PANEL
ALT: 29 [IU]/L (ref 0–32)
AST: 31 [IU]/L (ref 0–40)
Albumin: 3.4 g/dL — ABNORMAL LOW (ref 3.8–4.9)
Alkaline Phosphatase: 161 [IU]/L — ABNORMAL HIGH (ref 44–121)
BUN/Creatinine Ratio: 23 (ref 9–23)
BUN: 37 mg/dL — ABNORMAL HIGH (ref 6–24)
Bilirubin Total: 0.3 mg/dL (ref 0.0–1.2)
CO2: 25 mmol/L (ref 20–29)
Calcium: 8.4 mg/dL — ABNORMAL LOW (ref 8.7–10.2)
Chloride: 95 mmol/L — ABNORMAL LOW (ref 96–106)
Creatinine, Ser: 1.64 mg/dL — ABNORMAL HIGH (ref 0.57–1.00)
Globulin, Total: 2.7 g/dL (ref 1.5–4.5)
Glucose: 76 mg/dL (ref 70–99)
Potassium: 4.5 mmol/L (ref 3.5–5.2)
Sodium: 139 mmol/L (ref 134–144)
Total Protein: 6.1 g/dL (ref 6.0–8.5)
eGFR: 36 mL/min/{1.73_m2} — ABNORMAL LOW (ref >59)

## 2023-04-27 LAB — IRON,TIBC AND FERRITIN PANEL
Ferritin: 391 ng/mL — ABNORMAL HIGH (ref 15–150)
Iron Saturation: 14 % — ABNORMAL LOW (ref 15–55)
Iron: 34 ug/dL (ref 27–159)
Total Iron Binding Capacity: 241 ug/dL — ABNORMAL LOW (ref 250–450)
UIBC: 207 ug/dL (ref 131–425)

## 2023-05-03 ENCOUNTER — Other Ambulatory Visit: Payer: 59

## 2023-05-03 ENCOUNTER — Other Ambulatory Visit: Payer: Self-pay | Admitting: Family Medicine

## 2023-05-03 ENCOUNTER — Telehealth: Payer: Self-pay

## 2023-05-03 MED ORDER — TRULANCE 3 MG PO TABS: 3.0000 mg | ORAL_TABLET | Freq: Every day | ORAL | 5 refills | Status: DC

## 2023-05-03 NOTE — Telephone Encounter (Signed)
Patient called and stated that her new insurance has denied prior auth and coverage for her Amitiza. She states she is sad because the medication actually works for her. She is wanting to know if you can recommend a different medication unfortunately. She states that the pharmacy told her to let us know that the Linzess is also expensive through her insurance too as well. But previously patient has tried OTC Dulcolax, miralax, lactulose and that is it. Please advise.

## 2023-05-03 NOTE — Telephone Encounter (Signed)
Patient informed. 

## 2023-05-04 ENCOUNTER — Other Ambulatory Visit: Payer: Self-pay

## 2023-05-04 ENCOUNTER — Telehealth: Payer: Self-pay

## 2023-05-04 ENCOUNTER — Other Ambulatory Visit: Payer: Self-pay | Admitting: Family Medicine

## 2023-05-04 DIAGNOSIS — E890 Postprocedural hypothyroidism: Secondary | ICD-10-CM

## 2023-05-04 MED ORDER — FUROSEMIDE 40 MG PO TABS: 80.0000 mg | ORAL_TABLET | Freq: Three times a day (TID) | ORAL | 0 refills | Status: DC

## 2023-05-04 NOTE — Telephone Encounter (Signed)
-----   Message from Blane Ohara sent at 05/04/2023  1:21 PM EDT ----- Regarding: needs thyroid levels. Please call and have patient come for tsh and free T4. She needs this before we fill prescription for levothyroxine. Dr. Sedalia Muta

## 2023-05-04 NOTE — Telephone Encounter (Signed)
Message left for patient to come in for recheck of TSH and Free T4.

## 2023-05-04 NOTE — Progress Notes (Signed)
t

## 2023-05-05 ENCOUNTER — Other Ambulatory Visit: Payer: Self-pay | Admitting: Family Medicine

## 2023-05-05 ENCOUNTER — Ambulatory Visit: Payer: 59

## 2023-05-06 ENCOUNTER — Encounter: Payer: Self-pay | Admitting: Oncology

## 2023-05-06 ENCOUNTER — Other Ambulatory Visit: Payer: Medicare Other

## 2023-05-06 DIAGNOSIS — E890 Postprocedural hypothyroidism: Secondary | ICD-10-CM

## 2023-05-07 ENCOUNTER — Other Ambulatory Visit: Payer: Self-pay

## 2023-05-07 ENCOUNTER — Encounter: Payer: Self-pay | Admitting: Infectious Diseases

## 2023-05-07 ENCOUNTER — Telehealth: Payer: Self-pay

## 2023-05-07 ENCOUNTER — Ambulatory Visit: Payer: Medicare Other | Admitting: Infectious Diseases

## 2023-05-07 VITALS — BP 112/77 | HR 85 | Temp 97.7°F | Wt 165.0 lb

## 2023-05-07 DIAGNOSIS — T8149XD Infection following a procedure, other surgical site, subsequent encounter: Secondary | ICD-10-CM | POA: Diagnosis present

## 2023-05-07 DIAGNOSIS — M8610 Other acute osteomyelitis, unspecified site: Secondary | ICD-10-CM

## 2023-05-07 DIAGNOSIS — T8149XA Infection following a procedure, other surgical site, initial encounter: Secondary | ICD-10-CM | POA: Insufficient documentation

## 2023-05-07 DIAGNOSIS — Z452 Encounter for adjustment and management of vascular access device: Secondary | ICD-10-CM

## 2023-05-07 DIAGNOSIS — Z79899 Other long term (current) drug therapy: Secondary | ICD-10-CM

## 2023-05-07 LAB — TSH: TSH: 30 u[IU]/mL — ABNORMAL HIGH (ref 0.450–4.500)

## 2023-05-07 LAB — T4, FREE: Free T4: 0.67 ng/dL — ABNORMAL LOW (ref 0.82–1.77)

## 2023-05-07 NOTE — Progress Notes (Addendum)
Patient Active Problem List   Diagnosis Date Noted   Medication management 04/16/2023   Osteomyelitis (HCC) 04/16/2023   PICC (peripherally inserted central catheter) in place 04/16/2023   Hardware complicating wound infection (HCC) 04/16/2023   Frequent falls 03/01/2023   Hypomagnesemia 06/15/2022   Dyspnea on exertion 03/06/2022   Heart murmur 03/06/2022   Therapeutic opioid induced constipation 02/04/2022   Chronic idiopathic constipation 01/30/2022   Hyperkalemia 01/30/2022   History of cardiac arrhythmia 01/30/2022   Generalized edema 01/12/2022   Muscle cramps 01/11/2022   Weight gain 01/11/2022   History of sepsis 12/13/2021   Urinary urgency 11/24/2021   Hyperphosphatemia 08/13/2021   Cavus deformity of right foot 07/07/2021   Alcoholic peripheral neuropathy (HCC) 07/07/2021   Metatarsus adductus of right foot 07/07/2021   Encounter for screening mammogram for malignant neoplasm of breast 07/06/2021   Stage 3b chronic kidney disease (HCC) 04/16/2021   Primary insomnia 03/26/2021   Telogen effluvium 03/26/2021   Severe protein-calorie malnutrition (HCC) 03/26/2021   Vitamin D deficiency    GERD (gastroesophageal reflux disease)    CKD (chronic kidney disease)    Bipolar disorder current episode depressed (HCC)    Anemia in chronic kidney disease    Peripheral edema 11/01/2020   Shortness of breath 11/01/2020   Ulcer of right foot, with fat layer exposed (HCC) 10/10/2020   Ulcer of left foot with fat layer exposed (HCC) 10/04/2020   Status post foot surgery 06/11/2020   Multiple closed fractures of ribs of left side 06/03/2020   Iron deficiency anemia due to chronic blood loss 05/08/2020   Arterial hypotension 05/08/2020   Leucocytosis 04/21/2020   Mild recurrent major depression (HCC) 04/19/2020   Generalized anxiety disorder 04/19/2020   Hyponatremia 02/19/2020   Lumbar disc disease 10/19/2019   Korsakoff syndrome (HCC)    Localized osteoporosis without  current pathological fracture    Portal hypertension (HCC)    Alcoholic cirrhosis of liver (HCC)    Anemia of chronic disease 08/08/2016   Hypocalcemia 12/10/2015   Post-surgical hypothyroidism 12/10/2015   Migraine 04/20/2014   Stroke (HCC) 04/20/2014   Obsessive-compulsive disorder 11/07/2013   Alcohol dependence in remission (HCC) 10/04/2013    Patient's Medications  New Prescriptions   No medications on file  Previous Medications   ACETAMINOPHEN (TYLENOL) 325 MG TABLET    Take by mouth.   ALPRAZOLAM (XANAX) 0.25 MG TABLET    Take 1 tablet (0.25 mg total) by mouth 2 (two) times daily as needed for anxiety. TAKE 1 TABLET(0.25 MG) BY MOUTH TWICE DAILY   ALPRAZOLAM (XANAX) 0.25 MG TABLET    TAKE 1 TABLET(0.25 MG) BY MOUTH TWICE DAILY   BUDESONIDE-FORMOTEROL (SYMBICORT) 160-4.5 MCG/ACT INHALER    Inhale 2 puffs into the lungs 2 (two) times daily.   CALCITRIOL (ROCALTROL) 0.5 MCG CAPSULE    Take 0.5 mcg by mouth 2 (two) times daily.   CALCIUM CARBONATE (OSCAL) 1500 (600 CA) MG TABS TABLET    Take 3,600 mg by mouth 2 (two) times daily with a meal.   CYANOCOBALAMIN (VITAMIN B 12 PO)    Take 1,000 mcg by mouth daily.   CYCLOBENZAPRINE (FLEXERIL) 10 MG TABLET    TAKE 1 TABLET(10 MG) BY MOUTH THREE TIMES DAILY AS NEEDED FOR MUSCLE SPASMS   DOXYCYCLINE (VIBRA-TABS) 100 MG TABLET    Take 100 mg by mouth 2 (two) times daily.   FAMOTIDINE (PEPCID) 40 MG TABLET    Take 40 mg by mouth at  bedtime.   FUROSEMIDE (LASIX) 40 MG TABLET    Take 2 tablets (80 mg total) by mouth 3 (three) times daily between meals.   GABAPENTIN (NEURONTIN) 100 MG CAPSULE    TAKE 1 CAPSULE(100 MG) BY MOUTH THREE TIMES DAILY   GLUCAGON (GVOKE HYPOPEN 2-PACK) 0.5 MG/0.1ML SOAJ    If glucose less than 60, use gvoke pen x 1. May repeat if not responding x 1 daily. Call EMS if not improving.   HYDROXYZINE (VISTARIL) 25 MG CAPSULE    TAKE 1 CAPSULE(25 MG) BY MOUTH THREE TIMES DAILY   IRON-VITAMINS (GERITOL COMPLETE) TABS    Take 1  tablet by mouth daily.   LAMOTRIGINE (LAMICTAL) 200 MG TABLET    Take 200 mg by mouth at bedtime.   LEVOTHYROXINE (SYNTHROID) 137 MCG TABLET    TAKE 1 TABLET(137 MCG) BY MOUTH DAILY   MAGNESIUM OXIDE (MAG-OX) 400 MG TABLET    Take 400 mg by mouth daily.   MIRTAZAPINE (REMERON) 15 MG TABLET    Take 7.5-15 mg by mouth at bedtime.   OMEPRAZOLE (PRILOSEC) 40 MG CAPSULE    Take 40 mg by mouth daily.   ONDANSETRON (ZOFRAN) 4 MG TABLET    TAKE 1 TABLET(4 MG) BY MOUTH EVERY 8 HOURS AS NEEDED FOR NAUSEA OR VOMITING   ONDANSETRON (ZOFRAN-ODT) 4 MG DISINTEGRATING TABLET    Take 4 mg by mouth every 8 (eight) hours as needed.   OXYCODONE (OXY IR/ROXICODONE) 5 MG IMMEDIATE RELEASE TABLET    Take 5 mg by mouth every 6 (six) hours as needed.   PANTOPRAZOLE (PROTONIX) 40 MG TABLET    TAKE 1 TABLET(40 MG) BY MOUTH DAILY   PHENTERMINE (ADIPEX-P) 37.5 MG TABLET    Take 37.5 mg by mouth every morning.   PLECANATIDE (TRULANCE) 3 MG TABS    Take 1 tablet (3 mg total) by mouth daily.   PROMETHAZINE (PHENERGAN) 25 MG TABLET    TAKE 1 TABLET BY MOUTH FOUR TIMES DAILY AS NEEDED FOR NAUSEA OR VOMITING   PROPRANOLOL (INDERAL) 10 MG TABLET    Take 10 mg by mouth at bedtime.   QUETIAPINE (SEROQUEL) 50 MG TABLET    Take 100 mg by mouth at bedtime.   RIFAXIMIN (XIFAXAN) 550 MG TABS TABLET    Take 550 mg by mouth 2 (two) times daily.   SERTRALINE (ZOLOFT) 100 MG TABLET    Take 100 mg by mouth in the morning and at bedtime.   SEVELAMER CARBONATE (RENVELA) 800 MG TABLET    Take 800 mg by mouth 3 (three) times daily with meals.   SPIRONOLACTONE (ALDACTONE) 50 MG TABLET    Take 100 mg by mouth 2 (two) times daily.   THIAMINE 100 MG TABLET    Take 100 mg by mouth daily.   TRAMADOL (ULTRAM) 50 MG TABLET    TAKE 2 TABLETS BY MOUTH TWICE DAILY FOR BACK PAIN   TRIAMCINOLONE (KENALOG) 0.1 % PASTE    USE AS DIRECTED IN THE MOUTH OR THROAT TWICE DAILY.   VITAMIN D, ERGOCALCIFEROL, (DRISDOL) 1.25 MG (50000 UNIT) CAPS CAPSULE    Take 1  capsule (50,000 Units total) by mouth 2 (two) times a week.   ZOLPIDEM (AMBIEN) 10 MG TABLET    TAKE 1 TABLET(10 MG) BY MOUTH AT BEDTIME AS NEEDED FOR SLEEP  Modified Medications   No medications on file  Discontinued Medications   No medications on file    Subjective: 57 year old female with PMH of alcohol abuse, alcoholic liver cirrhosis  with portal hypertension, CKD, GAD, GAD/depression, PEM, CKD  who is referred from [facility] for post op wound infection of rt shoulder/osteomyelitis   Patient had ORIF of right distal clavicular fracture type IIa on July 10.  2024.  Patient was doing well until approximately 3 weeks postop 8/11 when she fell onto the right shoulder sustaining a fracture medial to the hook plate without any obvious implant loosening.  This was decided to be treated conservatively initially until she began having some blisters which appeared to be fracture blisters without evidence of infection initially but later started having increased swelling, redness.  One of the blisters was aspirated 9/11 which  was purulent and  concerning for infection with elevated neutrophilic Wbc count, Cx Normal skin flora. 9/12 she underwent I and D of rt shoulder wound with removal of deep rt shoulder implant. OR findings - evidence of significant healing involving distal 3rd clavicle # with non united # medial to the hook plate. Evidence of early post op wound infection. Implant including hook plate and 6 screws were removed. No significant evidence of osteomyelitis. Patient was started on Iv abtx post op, picc line placed and discharged on PO doxycycline  100mg  po bid and ceftriaxone 2 g iv daily  I called [facility] regarding cx results 9/11 chest wound cx Normal skin flora 9/12 OR cx, 2 samples - no growth in both samples  Denies smoking, reports being sober from alcohol for several years now. Denies IVDU.   10/18 Reports getting IV abtx through rt arm PICC without concerns except mild  redness at the site of insertion of PICC which she noticed 'one to two hours' after the home health nurse changed the dressing today. She denies pain, tenderness, fever, chills, nausea, and vomiting or diarrhea. She has been able to administer her antibiotics without issue. Reports seen by Orthopedics a couple of weeks ago and was told wound has healed. Reports good ROM Of rt shoulder.  No complaints otherwise.   Review of Systems: as above  Past Medical History:  Diagnosis Date   Alcohol dependence in remission (HCC) 10/04/2013   Alcoholic cirrhosis of liver (HCC)    Anemia    Anemia of chronic disease 08/08/2016   Arterial hypotension 05/08/2020   Bipolar disorder current episode depressed (HCC)    Brain TIA 02/19/2020   CKD (chronic kidney disease)    Episodic mood disorder (HCC) 11/07/2013   Generalized anxiety disorder    Generalized weakness 07/11/2020   GERD (gastroesophageal reflux disease)    H/O bariatric surgery 06/11/2020   Hypocalcemia 12/10/2015   Hypokalemia    Hyponatremia 02/19/2020   Intestinal malabsorption    Iron deficiency anemia due to chronic blood loss 05/08/2020   Korsakoff syndrome (HCC)    Lumbar disc disease 10/19/2019   Microscopic hematuria 04/16/2021   Migraine 04/20/2014   Mild recurrent major depression (HCC) 04/19/2020   Multiple closed fractures of ribs of left side 06/03/2020   Formatting of this note might be different from the original. Added automatically from request for surgery [id number]   Neuropathy 01/31/2020   Obsessive-compulsive disorder 11/07/2013   Other osteoporosis without current pathological fracture    Peripheral edema 11/01/2020   Polypharmacy 06/12/2020   Portal hypertension (HCC)    Post-surgical hypothyroidism 12/10/2015   Primary insomnia 03/26/2021   Severe protein-calorie malnutrition (HCC) 03/26/2021   Shortness of breath 11/01/2020   Stage 3b chronic kidney disease (HCC) 04/16/2021   Stroke (HCC)     left basal  ganglia stroke  (hemorrhagic)   Telogen effluvium 03/26/2021   Ulcer of left foot with fat layer exposed (HCC) 10/04/2020   Ulcer of right foot, with fat layer exposed (HCC) 10/10/2020   Vitamin D deficiency    Past Surgical History:  Procedure Laterality Date   BREAST EXCISIONAL BIOPSY Left    CATARACT EXTRACTION     GASTRIC BYPASS  2005   HEMORRHOID SURGERY     HERNIA REPAIR     metatarsus abductus surgery Left 1990   PARTIAL HYSTERECTOMY  05/1997   BL Ovaries remain. Done for fibroids.   THYROIDECTOMY  03/2013    Social History   Tobacco Use   Smoking status: Never   Smokeless tobacco: Never  Substance Use Topics   Alcohol use: Not Currently    Comment: history of alcoholism. sober since 2014.   Drug use: Never    Family History  Problem Relation Age of Onset   Breast cancer Mother    Osteoporosis Mother    Cancer Mother        Breast, lung, skin.    Breast cancer Maternal Grandmother     Allergies  Allergen Reactions   Cholestyramine     Rash and itching   Trintellix [Vortioxetine]     Health Maintenance  Topic Date Due   Medicare Annual Wellness (AWV)  Never done   Zoster Vaccines- Shingrix (2 of 2) 10/21/2020   DTaP/Tdap/Td (2 - Td or Tdap) 10/19/2022   INFLUENZA VACCINE  02/18/2023   COVID-19 Vaccine (6 - 2023-24 season) 03/21/2023   MAMMOGRAM  09/18/2023   Colonoscopy  03/13/2030   HPV VACCINES  Aged Out   Hepatitis C Screening  Discontinued   HIV Screening  Discontinued    Objective: BP 112/77   Pulse 85   Temp 97.7 F (36.5 C) (Oral)   Wt 165 lb (74.8 kg)   SpO2 97%   BMI 26.63 kg/m    Physical Exam Constitutional:      Appearance: Normal appearance.  HENT:     Head: Normocephalic and atraumatic.      Mouth: Mucous membranes are moist.  Eyes:    Conjunctiva/sclera: Conjunctivae normal.     Pupils: Pupils are equal, round, and bilaterally symmetrical    Cardiovascular:     Rate and Rhythm: Normal rate and regular rhythm.     Heart  sounds:  Pulmonary:     Effort: Pulmonary effort is normal.     Breath sounds:  Abdominal:     General: Non distended     Palpations:    Musculoskeletal:        General: Normal range of motion. Rt shoulder wound - healed.  Good ROM in the rt shoulder.  RT shoulder   Picc- Mild redness at insertion site, no warmth, tenderness or warmth or swelling. No swelling in the LUE    Skin:    General: Skin is warm and dry.     Comments:  Neurological:     General: grossly non focal     Mental Status: awake, alert and oriented to person, place, and time.   Psychiatric:        Mood and Affect: Mood normal.   Lab Results Lab Results  Component Value Date   WBC 16.6 (H) 04/26/2023   HGB 10.9 (L) 04/26/2023   HCT 36.0 04/26/2023   MCV 103 (H) 04/26/2023   PLT 435 04/26/2023    Lab Results  Component Value Date   CREATININE 1.64 (H) 04/26/2023  BUN 37 (H) 04/26/2023   NA 139 04/26/2023   K 4.5 04/26/2023   CL 95 (L) 04/26/2023   CO2 25 04/26/2023    Lab Results  Component Value Date   ALT 29 04/26/2023   AST 31 04/26/2023   ALKPHOS 161 (H) 04/26/2023   BILITOT 0.3 04/26/2023    Lab Results  Component Value Date   CHOL 121 03/01/2023   HDL 68 03/01/2023   LDLCALC 39 03/01/2023   TRIG 65 03/01/2023   CHOLHDL 1.8 03/01/2023   No results found for: "LABRPR", "RPRTITER" No results found for: "HIV1RNAQUANT", "HIV1RNAVL", "CD4TABS"              Assessment/Plan # Post op rt shoulder wound infection # Probable osteomyelitis  - 9/12 she underwent I and D of rt shoulder wound with removal of deep rt shoulder implant. OR findings - evidence of significant healing involving distal 3rd clavicle # with non united # medial to the hook plate. Evidence of early post op wound infection. Implant including hook plate and 6 screws were removed. No significant evidence of osteomyelitis.  - Started on doxycycline and ceftriaxone post op until 9/30 when stated on daptomycin  and continued on ceftriaxone up until today   Plan  - Complete daptomycin and ceftriaxone course on 10/24. No need for PO abtx switch as hardware out - Fu in 2-3 weeks - Fu with Orthopedics as instructed   # Medication Monitoring - 10/17 cr 1.3, ALT 57, AST 70, ALP 138, CRP 9.4 ESR 40 WBC 10.2, Hb 9.5, plts 435  # PICC  - mild erythema with no signs of infection. Instructed to watch for worsening erythema, pain, swelling and tenderness in which case plan to remove - To be removed after last dose of abtx   # Alcoholic liver cirrhosis - Follow up with GI doctor at [facility]   I have personally spent 40  minutes involved in face-to-face and non-face-to-face activities for this patient on the day of the visit. Professional time spent includes the following activities: Preparing to see the patient (review of tests), Obtaining and/or reviewing separately obtained history (admission/discharge record), Performing a medically appropriate examination and/or evaluation , Ordering medications/tests/procedures, referring and communicating with other health care professionals, Documenting clinical information in the EMR, Independently interpreting results (not separately reported), Communicating results to the patient/family/caregiver, Counseling and educating the patient/family/caregiver and Care coordination (not separately reported).   Victoriano Lain, MD [facility] 05/07/2023, 10:50 AM

## 2023-05-07 NOTE — Addendum Note (Signed)
Addended by: Odette Fraction on: 05/07/2023 06:18 PM   Modules accepted: Orders

## 2023-05-07 NOTE — Telephone Encounter (Signed)
Per Dr. Elinor Parkinson patient can have picc pulled after last dose. Community message sent to [employer] team with orders. Patient aware of plan. Juanita Laster, RMA

## 2023-05-09 ENCOUNTER — Other Ambulatory Visit: Payer: Self-pay | Admitting: Family Medicine

## 2023-05-09 MED ORDER — LEVOTHYROXINE SODIUM 150 MCG PO TABS: 150.0000 ug | ORAL_TABLET | Freq: Every day | ORAL | 0 refills | Status: DC

## 2023-05-12 ENCOUNTER — Other Ambulatory Visit: Payer: Self-pay | Admitting: Oncology

## 2023-05-12 DIAGNOSIS — N189 Chronic kidney disease, unspecified: Secondary | ICD-10-CM

## 2023-05-12 NOTE — Progress Notes (Signed)
[facility]  [address] [place],  [place]  [address] [phone]  Clinic Day:  05/13/2023  Referring physician: Blane Ohara, MD  HISTORY OF PRESENT ILLNESS:  The patient is a 57 y.o. female with a history of both leukocytosis and anemia.  A recent bone marrow biopsy did not show any obvious marrow etiology behind her abnormal counts.  She has been receiving monthly Retacrit shots to get her hemoglobin to/above 10.  She comes in today to reassess her anemia.  Overall, the patient claims to be doing okay.  She does have occasional fatigue, but denies having any overt forms of blood loss.    PHYSICAL EXAM:  Blood pressure 114/73, pulse 85, temperature 97.6 F (36.4 C), resp. rate 16, height 5\' 6"  (1.676 m), weight 169 lb 4.8 oz (76.8 kg), SpO2 93%. Wt Readings from Last 3 Encounters:  05/13/23 169 lb 4.8 oz (76.8 kg)  05/07/23 165 lb (74.8 kg)  04/16/23 158 lb (71.7 kg)   Body mass index is 27.33 kg/m. Performance status (ECOG): 1 Physical Exam Constitutional:      Appearance: Normal appearance. She is ill-appearing (chronically ill appearance, but  she looks better vs previous visits).  HENT:     Mouth/Throat:     Mouth: Mucous membranes are moist.     Pharynx: Oropharynx is clear. No oropharyngeal exudate or posterior oropharyngeal erythema.  Cardiovascular:     Rate and Rhythm: Normal rate and regular rhythm.     Heart sounds: No murmur heard.    No friction rub. No gallop.  Pulmonary:     Effort: Pulmonary effort is normal. No respiratory distress.     Breath sounds: No wheezing, rhonchi or rales.  Abdominal:     General: Bowel sounds are normal. There is no distension.     Palpations: Abdomen is soft. There is no mass.     Tenderness: There is no abdominal tenderness.  Musculoskeletal:        General: No swelling.     Right lower leg: No edema.     Left lower leg: No edema.  Lymphadenopathy:     Cervical: No cervical  adenopathy.     Upper Body:     Right upper body: No supraclavicular or axillary adenopathy.     Left upper body: No supraclavicular or axillary adenopathy.     Lower Body: No right inguinal adenopathy. No left inguinal adenopathy.  Skin:    General: Skin is warm.     Coloration: Skin is not jaundiced.     Findings: No lesion or rash.  Neurological:     General: No focal deficit present.     Mental Status: She is alert and oriented to person, place, and time. Mental status is at baseline.  Psychiatric:        Mood and Affect: Mood normal.        Behavior: Behavior normal.        Thought Content: Thought content normal.    LABS:    Latest Reference Range & Units 05/13/23 00:00  WBC  9.5 (E)  RBC 3.87 - 5.11  3.15 ! (E)  Hemoglobin 12.0 - 16.0  9.5 ! (E)  HCT 36 - 46  30 ! (E)  Platelets 150 - 400 K/uL 446 ! (E)  NEUT#  6.94 (E)  !: Data is abnormal (E): External lab result  Latest Reference Range & Units 05/13/23 10:06  Sodium 135 - 145 mmol/L 134 (L)  Potassium  3.5 - 5.1 mmol/L 3.5  Chloride 98 - 111 mmol/L 97 (L)  CO2 22 - 32 mmol/L 27  Glucose 70 - 99 mg/dL 161 (H)  BUN 6 - 20 mg/dL 30 (H)  Creatinine 0.96 - 1.00 mg/dL 0.45 (H)  Calcium 8.9 - 10.3 mg/dL 7.5 (L)  Anion gap 5 - 15  10  Alkaline Phosphatase 38 - 126 U/L 120  Albumin 3.5 - 5.0 g/dL 2.9 (L)  AST 15 - 41 U/L 31  ALT 0 - 44 U/L 29  Total Protein 6.5 - 8.1 g/dL 6.3 (L)  Total Bilirubin 0.3 - 1.2 mg/dL 0.7  GFR, Est Non African American >60 mL/min >60  Iron 28 - 170 ug/dL 59  UIBC ug/dL 409  TIBC 811 - 914 ug/dL 782  Saturation Ratios 10.4 - 31.8 % 21  Ferritin 11 - 307 ng/mL 263  Folate >5.9 ng/mL 20.7  Vitamin B12 180 - 914 pg/mL 295  (L): Data is abnormally low (H): Data is abnormally high  ASSESSMENT & PLAN:  Assessment/Plan:  A 57 y.o. female with both leukocytosis and anemia secondary to chronic renal insufficiency.  As her hemoglobin remains below 10, she will continue to receive monthly  Retacrit injections at 40,000 units.  Her CBC will be checked monthly to see how well she is responding to her Retacrit shots.  I will see her back in 3 months for repeat clinical assessment.  The patient understands all the plans discussed today and is in agreement with them.    Giuseppe Duchemin Kirby Funk, MD

## 2023-05-13 ENCOUNTER — Inpatient Hospital Stay (HOSPITAL_BASED_OUTPATIENT_CLINIC_OR_DEPARTMENT_OTHER): Payer: Medicare Other | Admitting: Oncology

## 2023-05-13 ENCOUNTER — Inpatient Hospital Stay: Payer: Medicare Other

## 2023-05-13 VITALS — BP 114/73 | HR 85 | Temp 97.6°F | Resp 16 | Ht 66.0 in | Wt 169.3 lb

## 2023-05-13 DIAGNOSIS — Z992 Dependence on renal dialysis: Secondary | ICD-10-CM

## 2023-05-13 DIAGNOSIS — D72829 Elevated white blood cell count, unspecified: Secondary | ICD-10-CM | POA: Diagnosis not present

## 2023-05-13 DIAGNOSIS — D631 Anemia in chronic kidney disease: Secondary | ICD-10-CM

## 2023-05-13 DIAGNOSIS — N186 End stage renal disease: Secondary | ICD-10-CM

## 2023-05-13 LAB — CMP (CANCER CENTER ONLY)
ALT: 29 U/L (ref 0–44)
AST: 31 U/L (ref 15–41)
Albumin: 2.9 g/dL — ABNORMAL LOW (ref 3.5–5.0)
Alkaline Phosphatase: 120 U/L (ref 38–126)
Anion gap: 10 (ref 5–15)
BUN: 30 mg/dL — ABNORMAL HIGH (ref 6–20)
CO2: 27 mmol/L (ref 22–32)
Calcium: 7.5 mg/dL — ABNORMAL LOW (ref 8.9–10.3)
Chloride: 97 mmol/L — ABNORMAL LOW (ref 98–111)
Creatinine: 1.06 mg/dL — ABNORMAL HIGH (ref 0.44–1.00)
GFR, Estimated: >60 mL/min (ref >60)
Glucose, Bld: 128 mg/dL — ABNORMAL HIGH (ref 70–99)
Potassium: 3.5 mmol/L (ref 3.5–5.1)
Sodium: 134 mmol/L — ABNORMAL LOW (ref 135–145)
Total Bilirubin: 0.7 mg/dL (ref 0.3–1.2)
Total Protein: 6.3 g/dL — ABNORMAL LOW (ref 6.5–8.1)

## 2023-05-13 LAB — CBC: RBC: 3.15 — AB (ref 3.87–5.11)

## 2023-05-13 LAB — VITAMIN B12: Vitamin B-12: 295 pg/mL (ref 180–914)

## 2023-05-13 LAB — IRON AND TIBC
Iron: 59 ug/dL (ref 28–170)
Saturation Ratios: 21 % (ref 10.4–31.8)
TIBC: 287 ug/dL (ref 250–450)
UIBC: 228 ug/dL

## 2023-05-13 LAB — CBC AND DIFFERENTIAL
HCT: 30 — AB (ref 36–46)
Hemoglobin: 9.5 — AB (ref 12.0–16.0)
Neutrophils Absolute: 6.94
Platelets: 446 10*3/uL — AB (ref 150–400)
WBC: 9.5

## 2023-05-13 LAB — FOLATE: Folate: 20.7 ng/mL (ref >5.9)

## 2023-05-13 LAB — FERRITIN: Ferritin: 263 ng/mL (ref 11–307)

## 2023-05-17 ENCOUNTER — Other Ambulatory Visit: Payer: Self-pay

## 2023-05-17 DIAGNOSIS — E890 Postprocedural hypothyroidism: Secondary | ICD-10-CM

## 2023-05-17 NOTE — Addendum Note (Signed)
Addended by: Domenic Schwab on: 05/17/2023 05:29 PM   Modules accepted: Orders

## 2023-05-18 ENCOUNTER — Inpatient Hospital Stay: Payer: Medicare Other

## 2023-05-18 ENCOUNTER — Ambulatory Visit: Payer: 59

## 2023-05-18 VITALS — BP 112/66 | HR 96 | Temp 97.9°F

## 2023-05-18 DIAGNOSIS — D631 Anemia in chronic kidney disease: Secondary | ICD-10-CM

## 2023-05-18 DIAGNOSIS — D72829 Elevated white blood cell count, unspecified: Secondary | ICD-10-CM | POA: Diagnosis not present

## 2023-05-18 MED ORDER — EPOETIN ALFA-EPBX 40000 UNIT/ML IJ SOLN
40000.0000 [IU] | Freq: Once | INTRAMUSCULAR | Status: AC
  Administered 2023-05-18: 40000 [IU] via SUBCUTANEOUS
  Filled 2023-05-18: qty 1

## 2023-05-18 NOTE — Patient Instructions (Signed)

## 2023-05-21 ENCOUNTER — Ambulatory Visit: Payer: Medicare Other | Admitting: Infectious Diseases

## 2023-05-21 ENCOUNTER — Encounter: Payer: Self-pay | Admitting: Oncology

## 2023-05-23 ENCOUNTER — Encounter: Payer: Self-pay | Admitting: Oncology

## 2023-05-26 ENCOUNTER — Encounter: Payer: Self-pay | Admitting: Oncology

## 2023-05-26 ENCOUNTER — Encounter: Payer: Self-pay | Admitting: Family Medicine

## 2023-06-02 ENCOUNTER — Encounter: Payer: Self-pay | Admitting: Family Medicine

## 2023-06-03 ENCOUNTER — Other Ambulatory Visit: Payer: Self-pay

## 2023-06-03 DIAGNOSIS — R635 Abnormal weight gain: Secondary | ICD-10-CM | POA: Diagnosis not present

## 2023-06-03 DIAGNOSIS — Z23 Encounter for immunization: Secondary | ICD-10-CM | POA: Diagnosis not present

## 2023-06-03 MED ORDER — ZOLPIDEM TARTRATE 10 MG PO TABS: 10.0000 mg | ORAL_TABLET | Freq: Every day | ORAL | 3 refills | Status: DC

## 2023-06-07 DIAGNOSIS — K7682 Hepatic encephalopathy: Secondary | ICD-10-CM | POA: Diagnosis not present

## 2023-06-07 DIAGNOSIS — K7469 Other cirrhosis of liver: Secondary | ICD-10-CM | POA: Diagnosis not present

## 2023-06-07 DIAGNOSIS — K7031 Alcoholic cirrhosis of liver with ascites: Secondary | ICD-10-CM | POA: Diagnosis not present

## 2023-06-07 DIAGNOSIS — Z79899 Other long term (current) drug therapy: Secondary | ICD-10-CM | POA: Diagnosis not present

## 2023-06-07 DIAGNOSIS — Z6827 Body mass index (BMI) 27.0-27.9, adult: Secondary | ICD-10-CM | POA: Diagnosis not present

## 2023-06-07 DIAGNOSIS — D539 Nutritional anemia, unspecified: Secondary | ICD-10-CM | POA: Diagnosis not present

## 2023-06-07 DIAGNOSIS — D689 Coagulation defect, unspecified: Secondary | ICD-10-CM | POA: Diagnosis not present

## 2023-06-09 ENCOUNTER — Encounter: Payer: Self-pay | Admitting: Oncology

## 2023-06-09 DIAGNOSIS — E890 Postprocedural hypothyroidism: Secondary | ICD-10-CM | POA: Diagnosis not present

## 2023-06-09 DIAGNOSIS — M810 Age-related osteoporosis without current pathological fracture: Secondary | ICD-10-CM | POA: Diagnosis not present

## 2023-06-09 DIAGNOSIS — E559 Vitamin D deficiency, unspecified: Secondary | ICD-10-CM | POA: Diagnosis not present

## 2023-06-09 DIAGNOSIS — Z923 Personal history of irradiation: Secondary | ICD-10-CM | POA: Diagnosis not present

## 2023-06-09 DIAGNOSIS — C73 Malignant neoplasm of thyroid gland: Secondary | ICD-10-CM | POA: Diagnosis not present

## 2023-06-09 DIAGNOSIS — E871 Hypo-osmolality and hyponatremia: Secondary | ICD-10-CM | POA: Diagnosis not present

## 2023-06-11 ENCOUNTER — Ambulatory Visit: Payer: Medicare HMO | Admitting: Infectious Diseases

## 2023-06-14 ENCOUNTER — Encounter: Payer: Self-pay | Admitting: Oncology

## 2023-06-15 ENCOUNTER — Inpatient Hospital Stay: Payer: Medicare HMO | Attending: Oncology

## 2023-06-15 ENCOUNTER — Inpatient Hospital Stay: Payer: Medicare Other

## 2023-06-22 ENCOUNTER — Other Ambulatory Visit: Payer: Medicare Other

## 2023-06-24 ENCOUNTER — Encounter: Payer: Self-pay | Admitting: Oncology

## 2023-06-25 ENCOUNTER — Ambulatory Visit (INDEPENDENT_AMBULATORY_CARE_PROVIDER_SITE_OTHER): Payer: Medicare HMO | Admitting: Family Medicine

## 2023-06-25 ENCOUNTER — Encounter: Payer: Self-pay | Admitting: Family Medicine

## 2023-06-25 ENCOUNTER — Encounter: Payer: 59 | Admitting: Family Medicine

## 2023-06-25 VITALS — BP 88/56 | HR 71 | Temp 97.4°F | Resp 14 | Ht 66.0 in | Wt 167.0 lb

## 2023-06-25 DIAGNOSIS — Z1231 Encounter for screening mammogram for malignant neoplasm of breast: Secondary | ICD-10-CM | POA: Diagnosis not present

## 2023-06-25 DIAGNOSIS — N1832 Chronic kidney disease, stage 3b: Secondary | ICD-10-CM

## 2023-06-25 DIAGNOSIS — E43 Unspecified severe protein-calorie malnutrition: Secondary | ICD-10-CM

## 2023-06-25 DIAGNOSIS — E890 Postprocedural hypothyroidism: Secondary | ICD-10-CM

## 2023-06-25 DIAGNOSIS — F3132 Bipolar disorder, current episode depressed, moderate: Secondary | ICD-10-CM

## 2023-06-25 DIAGNOSIS — K7030 Alcoholic cirrhosis of liver without ascites: Secondary | ICD-10-CM

## 2023-06-25 DIAGNOSIS — Z13820 Encounter for screening for osteoporosis: Secondary | ICD-10-CM | POA: Diagnosis not present

## 2023-06-25 DIAGNOSIS — E559 Vitamin D deficiency, unspecified: Secondary | ICD-10-CM | POA: Diagnosis not present

## 2023-06-25 DIAGNOSIS — N3000 Acute cystitis without hematuria: Secondary | ICD-10-CM

## 2023-06-25 DIAGNOSIS — G621 Alcoholic polyneuropathy: Secondary | ICD-10-CM

## 2023-06-25 DIAGNOSIS — Z23 Encounter for immunization: Secondary | ICD-10-CM

## 2023-06-25 DIAGNOSIS — Z780 Asymptomatic menopausal state: Secondary | ICD-10-CM | POA: Diagnosis not present

## 2023-06-25 LAB — POCT URINALYSIS DIP (CLINITEK)
Bilirubin, UA: NEGATIVE
Blood, UA: NEGATIVE
Glucose, UA: NEGATIVE mg/dL
Ketones, POC UA: NEGATIVE mg/dL
Nitrite, UA: NEGATIVE
POC PROTEIN,UA: NEGATIVE
Spec Grav, UA: 1.01 (ref 1.010–1.025)
Urobilinogen, UA: 0.2 U/dL
pH, UA: 7 (ref 5.0–8.0)

## 2023-06-25 MED ORDER — NITROFURANTOIN MONOHYD MACRO 100 MG PO CAPS: 100.0000 mg | ORAL_CAPSULE | Freq: Two times a day (BID) | ORAL | 0 refills | Status: DC
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
## 2023-06-25 NOTE — Progress Notes (Deleted)
Subjective:  Patient ID: Molly Parrish, female    DOB: [birth year]  Age: 57 y.o. MRN: [id number]  Chief Complaint  Patient presents with   Medical Management of Chronic Issues    HPI    Constipation:  Patient presents for follow up on constipation. Patient was started on Amitiza 24 mcg take 1 capsule twice daily. Patient states medication is working great for her. Takes miralax once daily and benefiber daily.   Hypocalcemia: Calcium carbonate 600 mg 3 oral twice daily. Paresthesias. Cramps are still happening.   Hypoglycemia: has a meter. Sugars 65-70. Uses glucose tablets. Had gastric bypass 2005. Vomits twice a day. Eating 6 small meals per day.   Weight is unchanged. Has some swelling in her hands and feet by the night.    Anemia: sees Dr. Rennis Harding. Hb improved and iron studies have improved.      05/07/2023   10:44 AM 03/01/2023    3:33 PM 06/15/2022   10:02 AM 12/05/2021   10:55 AM 11/14/2021   11:16 AM  Depression screen PHQ 2/9  Decreased Interest 0 0 0 0 0  Down, Depressed, Hopeless 0 0 0 0 0  PHQ - 2 Score 0 0 0 0 0  Altered sleeping  0 3    Tired, decreased energy  3 3    Change in appetite  0 0    Feeling bad or failure about yourself   0 0    Trouble concentrating  0 0    Moving slowly or fidgety/restless   1    Suicidal thoughts  0 0    PHQ-9 Score  3 7    Difficult doing work/chores  Not difficult at all Somewhat difficult          05/07/2023   10:44 AM  Fall Risk   Falls in the past year? 1  Number falls in past yr: 1  Injury with Fall? 1    Patient Care Team: Blane Ohara, MD as PCP - General (Family Medicine) Doristine Bosworth., MD (Endocrinology) Carney Harder, MD as Referring Physician (Geriatric Medicine) [facility] Geraldine Contras, MD as Referring Physician (Psychiatry) Ancil Boozer, DPM (Inactive) as Referring Physician (Podiatry) Weston Settle, MD as Consulting Physician (Oncology) Barnabas Lister, MD  as Referring Physician (Nephrology)   Review of Systems  Current Outpatient Medications on File Prior to Visit  Medication Sig Dispense Refill   acetaminophen (TYLENOL) 325 MG tablet Take by mouth.     ALPRAZolam (XANAX) 0.25 MG tablet Take 1 tablet (0.25 mg total) by mouth 2 (two) times daily as needed for anxiety. TAKE 1 TABLET(0.25 MG) BY MOUTH TWICE DAILY 60 tablet 1   budesonide-formoterol (SYMBICORT) 160-4.5 MCG/ACT inhaler Inhale 2 puffs into the lungs 2 (two) times daily. 10.2 g 2   calcitRIOL (ROCALTROL) 0.5 MCG capsule Take 0.5 mcg by mouth 2 (two) times daily.     calcium carbonate (OSCAL) 1500 (600 Ca) MG TABS tablet Take 3,600 mg by mouth 2 (two) times daily with a meal.     Cyanocobalamin (VITAMIN B 12 PO) Take 1,000 mcg by mouth daily.     cyclobenzaprine (FLEXERIL) 10 MG tablet TAKE 1 TABLET(10 MG) BY MOUTH THREE TIMES DAILY AS NEEDED FOR MUSCLE SPASMS 90 tablet 2   famotidine (PEPCID) 40 MG tablet Take 40 mg by mouth at bedtime.     furosemide (LASIX) 40 MG tablet Take 2 tablets (80 mg total) by mouth 3 (three) times daily between  meals. 540 tablet 0   gabapentin (NEURONTIN) 100 MG capsule TAKE 1 CAPSULE(100 MG) BY MOUTH THREE TIMES DAILY 90 capsule 1   Glucagon (GVOKE HYPOPEN 2-PACK) 0.5 MG/0.1ML SOAJ If glucose less than 60, use gvoke pen x 1. May repeat if not responding x 1 daily. Call EMS if not improving. 0.2 mL 2   hydrOXYzine (VISTARIL) 25 MG capsule TAKE 1 CAPSULE(25 MG) BY MOUTH THREE TIMES DAILY 90 capsule 2   Iron-Vitamins (GERITOL COMPLETE) TABS Take 1 tablet by mouth daily.     lamoTRIgine (LAMICTAL) 200 MG tablet Take 200 mg by mouth at bedtime.     levothyroxine (SYNTHROID) 150 MCG tablet Take 1 tablet (150 mcg total) by mouth daily before breakfast. 90 tablet 0   magnesium oxide (MAG-OX) 400 MG tablet Take 400 mg by mouth daily.     mirtazapine (REMERON) 15 MG tablet Take 7.5-15 mg by mouth at bedtime.     omeprazole (PRILOSEC) 40 MG capsule Take 40 mg by  mouth daily.     ondansetron (ZOFRAN) 4 MG tablet TAKE 1 TABLET(4 MG) BY MOUTH EVERY 8 HOURS AS NEEDED FOR NAUSEA OR VOMITING 60 tablet 3   ondansetron (ZOFRAN-ODT) 4 MG disintegrating tablet Take 4 mg by mouth every 8 (eight) hours as needed.     oxyCODONE (OXY IR/ROXICODONE) 5 MG immediate release tablet Take 5 mg by mouth every 6 (six) hours as needed.     pantoprazole (PROTONIX) 40 MG tablet TAKE 1 TABLET(40 MG) BY MOUTH DAILY 90 tablet 0   phentermine (ADIPEX-P) 37.5 MG tablet Take 37.5 mg by mouth every morning.     Plecanatide (TRULANCE) 3 MG TABS Take 1 tablet (3 mg total) by mouth daily. 30 tablet 5   promethazine (PHENERGAN) 25 MG tablet TAKE 1 TABLET BY MOUTH FOUR TIMES DAILY AS NEEDED FOR NAUSEA OR VOMITING 40 tablet 2   propranolol (INDERAL) 10 MG tablet Take 10 mg by mouth at bedtime.     QUEtiapine (SEROQUEL) 50 MG tablet Take 100 mg by mouth at bedtime.     rifaximin (XIFAXAN) 550 MG TABS tablet Take 550 mg by mouth 2 (two) times daily.     sertraline (ZOLOFT) 100 MG tablet Take 100 mg by mouth in the morning and at bedtime.     sevelamer carbonate (RENVELA) 800 MG tablet Take 800 mg by mouth 3 (three) times daily with meals.     spironolactone (ALDACTONE) 50 MG tablet Take 100 mg by mouth 2 (two) times daily.     thiamine 100 MG tablet Take 100 mg by mouth daily.     traMADol (ULTRAM) 50 MG tablet TAKE 2 TABLETS BY MOUTH TWICE DAILY FOR BACK PAIN 120 tablet 1   triamcinolone (KENALOG) 0.1 % paste USE AS DIRECTED IN THE MOUTH OR THROAT TWICE DAILY. 5 g 2   Vitamin D, Ergocalciferol, (DRISDOL) 1.25 MG (50000 UNIT) CAPS capsule Take 1 capsule (50,000 Units total) by mouth 2 (two) times a week. 24 capsule 0   zolpidem (AMBIEN) 10 MG tablet Take 1 tablet (10 mg total) by mouth at bedtime. 30 tablet 3   Current Facility-Administered Medications on File Prior to Visit  Medication Dose Route Frequency Provider Last Rate Last Admin   ipratropium-albuterol (DUONEB) 0.5-2.5 (3) MG/3ML  nebulizer solution 3 mL  3 mL Nebulization Once Blane Ohara, MD       Past Medical History:  Diagnosis Date   Alcohol dependence in remission (HCC) 10/04/2013   Alcoholic cirrhosis of liver (HCC)  Anemia    Anemia of chronic disease 08/08/2016   Arterial hypotension 05/08/2020   Bipolar disorder current episode depressed (HCC)    Brain TIA 02/19/2020   CKD (chronic kidney disease)    Episodic mood disorder (HCC) 11/07/2013   Generalized anxiety disorder    Generalized weakness 07/11/2020   GERD (gastroesophageal reflux disease)    H/O bariatric surgery 06/11/2020   Hypocalcemia 12/10/2015   Hypokalemia    Hyponatremia 02/19/2020   Intestinal malabsorption    Iron deficiency anemia due to chronic blood loss 05/08/2020   Korsakoff syndrome (HCC)    Lumbar disc disease 10/19/2019   Microscopic hematuria 04/16/2021   Migraine 04/20/2014   Mild recurrent major depression (HCC) 04/19/2020   Multiple closed fractures of ribs of left side 06/03/2020   Formatting of this note might be different from the original. Added automatically from request for surgery [id number]   Neuropathy 01/31/2020   Obsessive-compulsive disorder 11/07/2013   Other osteoporosis without current pathological fracture    Peripheral edema 11/01/2020   Polypharmacy 06/12/2020   Portal hypertension (HCC)    Post-surgical hypothyroidism 12/10/2015   Primary insomnia 03/26/2021   Severe protein-calorie malnutrition (HCC) 03/26/2021   Shortness of breath 11/01/2020   Stage 3b chronic kidney disease (HCC) 04/16/2021   Stroke (HCC)     left basal ganglia stroke (hemorrhagic)   Telogen effluvium 03/26/2021   Ulcer of left foot with fat layer exposed (HCC) 10/04/2020   Ulcer of right foot, with fat layer exposed (HCC) 10/10/2020   Vitamin D deficiency    Past Surgical History:  Procedure Laterality Date   BREAST EXCISIONAL BIOPSY Left    CATARACT EXTRACTION     GASTRIC BYPASS  2005   HEMORRHOID SURGERY     HERNIA REPAIR     metatarsus  abductus surgery Left 1990   PARTIAL HYSTERECTOMY  05/1997   BL Ovaries remain. Done for fibroids.   THYROIDECTOMY  03/2013    Family History  Problem Relation Age of Onset   Breast cancer Mother    Osteoporosis Mother    Cancer Mother        Breast, lung, skin.    Breast cancer Maternal Grandmother    Social History   Socioeconomic History   Marital status: Married    Spouse name: Not on file   Number of children: 1   Years of education: Not on file   Highest education level: Associate degree: academic program  Occupational History   Occupation: IT trainer    Comment: Retired.  Tobacco Use   Smoking status: Never   Smokeless tobacco: Never  Substance and Sexual Activity   Alcohol use: Not Currently    Comment: history of alcoholism. sober since 2014.   Drug use: Never   Sexual activity: Yes  Other Topics Concern   Not on file  Social History Narrative   Right handed   Social Determinants of Health   Financial Resource Strain: Medium Risk (06/24/2023)   Overall Financial Resource Strain (CARDIA)    Difficulty of Paying Living Expenses: Somewhat hard  Food Insecurity: No Food Insecurity (06/24/2023)   Hunger Vital Sign    Worried About Running Out of Food in the Last Year: Never true    Ran Out of Food in the Last Year: Never true  Transportation Needs: No Transportation Needs (06/24/2023)   PRAPARE - Administrator, Civil Service (Medical): No    Lack of Transportation (Non-Medical): No  Physical Activity: Insufficiently Active (06/24/2023)  Exercise Vital Sign    Days of Exercise per Week: 3 days    Minutes of Exercise per Session: 30 min  Stress: Stress Concern Present (06/24/2023)   [employer] of Occupational Health - Occupational Stress Questionnaire    Feeling of Stress : Very much  Social Connections: Unknown (06/24/2023)   Social Connection and Isolation Panel [NHANES]    Frequency of Communication with Friends and Family: More than three times  a week    Frequency of Social Gatherings with Friends and Family: Twice a week    Attends Religious Services: Patient declined    Database administrator or Organizations: No    Attends Banker Meetings: Never    Marital Status: Married    Objective:  There were no vitals taken for this visit.     05/18/2023   11:15 AM 05/13/2023   11:15 AM 05/13/2023   11:00 AM  BP/Weight  Systolic BP 112 114 145  Diastolic BP 66 73 61  Wt. (Lbs)   169.3  BMI   27.33 kg/m2    Physical Exam  Diabetic Foot Exam - Simple   No data filed      Lab Results  Component Value Date   WBC 9.5 05/13/2023   HGB 9.5 (A) 05/13/2023   HCT 30 (A) 05/13/2023   PLT 446 (A) 05/13/2023   GLUCOSE 128 (H) 05/13/2023   CHOL 121 03/01/2023   TRIG 65 03/01/2023   HDL 68 03/01/2023   LDLCALC 39 03/01/2023   ALT 29 05/13/2023   AST 31 05/13/2023   NA 134 (L) 05/13/2023   K 3.5 05/13/2023   CL 97 (L) 05/13/2023   CREATININE 1.06 (H) 05/13/2023   BUN 30 (H) 05/13/2023   CO2 27 05/13/2023   TSH 30.000 (H) 05/06/2023      Assessment & Plan:    There are no diagnoses linked to this encounter.   No orders of the defined types were placed in this encounter.   No orders of the defined types were placed in this encounter.    Follow-up: No follow-ups on file.   I,Dior Stepter I Leal-Borjas,acting as a scribe for Blane Ohara, MD.,have documented all relevant documentation on the behalf of Blane Ohara, MD,as directed by  Blane Ohara, MD while in the presence of Blane Ohara, MD.   An After Visit Summary was printed and given to the patient.  Blane Ohara, MD [facility] [phone]

## 2023-06-25 NOTE — Patient Instructions (Signed)
VISIT SUMMARY:  During today's visit, we addressed your urinary symptoms, anxiety, constipation, liver cirrhosis, and concerns about osteoporosis. We also discussed your general health maintenance and scheduled necessary follow-up appointments.  YOUR PLAN:  -URINARY TRACT INFECTION: A urinary tract infection (UTI) is an infection in any part of your urinary system. You have symptoms like urgency and lower abdominal pain, and your urinalysis showed signs of infection. We have prescribed an appropriate antibiotic to treat this.  -CONSTIPATION SECONDARY TO LIVER CIRRHOSIS: Constipation can be a complication of liver cirrhosis. You are currently taking lactulose for this. We will continue with lactulose and explore options for obtaining Xifaxan, which is not currently covered by your insurance.  -OSTEOPOROSIS: Osteoporosis is a condition that weakens bones, making them fragile and more likely to break. Given your concerns about bone health, we will schedule a bone density scan and refer you to an osteoporosis clinic in [facility] for further management.  -HYPOTHYROIDISM: Hypothyroidism is a condition where your thyroid gland doesn't produce enough thyroid hormone. You are currently managed by Dr. Corwin Levins, and we recommend continuing with your current management plan.  -GENERAL HEALTH MAINTENANCE: We administered your COVID-19 booster today. We have also scheduled a mammogram and your Medicare annual wellness visit for next week. Additionally, we recommend getting tetanus and shingles vaccines at your pharmacy.  INSTRUCTIONS:  Please take the prescribed antibiotic as directed to treat your urinary tract infection. Continue using lactulose for constipation, and we will look into options for Xifaxan. Attend your scheduled bone density scan and follow up with the osteoporosis clinic in [place]. Continue your current management for hypothyroidism with Dr. Corwin Levins. Make sure to attend your  mammogram and Medicare annual wellness visit next week, and consider getting the tetanus and shingles vaccines at your pharmacy.

## 2023-06-26 LAB — CBC WITH DIFFERENTIAL/PLATELET
Basophils Absolute: 0.1 10*3/uL (ref 0.0–0.2)
Basos: 1 %
EOS (ABSOLUTE): 0.4 10*3/uL (ref 0.0–0.4)
Eos: 5 %
Hematocrit: 32.7 % — ABNORMAL LOW (ref 34.0–46.6)
Hemoglobin: 9.8 g/dL — ABNORMAL LOW (ref 11.1–15.9)
Immature Grans (Abs): 0 10*3/uL (ref 0.0–0.1)
Immature Granulocytes: 1 %
Lymphocytes Absolute: 1.6 10*3/uL (ref 0.7–3.1)
Lymphs: 18 %
MCH: 28.5 pg (ref 26.6–33.0)
MCHC: 30 g/dL — ABNORMAL LOW (ref 31.5–35.7)
MCV: 95 fL (ref 79–97)
Monocytes Absolute: 0.6 10*3/uL (ref 0.1–0.9)
Monocytes: 7 %
Neutrophils Absolute: 5.8 10*3/uL (ref 1.4–7.0)
Neutrophils: 68 %
Platelets: 329 10*3/uL (ref 150–450)
RBC: 3.44 x10E6/uL — ABNORMAL LOW (ref 3.77–5.28)
RDW: 13.5 % (ref 11.7–15.4)
WBC: 8.6 10*3/uL (ref 3.4–10.8)

## 2023-06-26 LAB — COMPREHENSIVE METABOLIC PANEL
ALT: 18 [IU]/L (ref 0–32)
AST: 22 [IU]/L (ref 0–40)
Albumin: 3.2 g/dL — ABNORMAL LOW (ref 3.8–4.9)
Alkaline Phosphatase: 172 [IU]/L — ABNORMAL HIGH (ref 44–121)
BUN/Creatinine Ratio: 20 (ref 9–23)
BUN: 30 mg/dL — ABNORMAL HIGH (ref 6–24)
Bilirubin Total: 0.3 mg/dL (ref 0.0–1.2)
CO2: 30 mmol/L — ABNORMAL HIGH (ref 20–29)
Calcium: 9.2 mg/dL (ref 8.7–10.2)
Chloride: 97 mmol/L (ref 96–106)
Creatinine, Ser: 1.52 mg/dL — ABNORMAL HIGH (ref 0.57–1.00)
Globulin, Total: 2.3 g/dL (ref 1.5–4.5)
Glucose: 60 mg/dL — ABNORMAL LOW (ref 70–99)
Potassium: 4.5 mmol/L (ref 3.5–5.2)
Sodium: 139 mmol/L (ref 134–144)
Total Protein: 5.5 g/dL — ABNORMAL LOW (ref 6.0–8.5)
eGFR: 39 mL/min/{1.73_m2} — ABNORMAL LOW (ref >59)

## 2023-06-26 LAB — VITAMIN D 25 HYDROXY (VIT D DEFICIENCY, FRACTURES): Vit D, 25-Hydroxy: 40 ng/mL (ref 30.0–100.0)

## 2023-06-27 LAB — URINE CULTURE

## 2023-06-27 NOTE — Assessment & Plan Note (Signed)
Previously well controlled Continue Synthroid at current dose  

## 2023-06-27 NOTE — Assessment & Plan Note (Signed)
Check vitamin D. 

## 2023-06-27 NOTE — Progress Notes (Signed)
error 

## 2023-06-27 NOTE — Assessment & Plan Note (Signed)
Management per specialist.  Dr. Sadiq.  

## 2023-06-27 NOTE — Assessment & Plan Note (Signed)
We will continue with lactulose and explore options for obtaining Xifaxan, which is not currently covered by your insurance.

## 2023-06-27 NOTE — Assessment & Plan Note (Signed)
Urinalysis shows 3+ white blood cells. -Prescribe appropriate antibiotic.

## 2023-06-27 NOTE — Assessment & Plan Note (Signed)
Management per psychiatry Continue Lamictal, Zoloft, quetiapine, and Xanax.

## 2023-06-27 NOTE — Assessment & Plan Note (Signed)
Secondary to gastric bypass.  Secondary to liver cirrhosis.

## 2023-06-27 NOTE — Assessment & Plan Note (Addendum)
urinalysis showed signs of infection. We have prescribed an appropriate antibiotic to treat this. Sent Macrobid to the pharmacy Order Urine Culture

## 2023-06-28 DIAGNOSIS — S42001A Fracture of unspecified part of right clavicle, initial encounter for closed fracture: Secondary | ICD-10-CM | POA: Diagnosis not present

## 2023-06-28 DIAGNOSIS — T847XXA Infection and inflammatory reaction due to other internal orthopedic prosthetic devices, implants and grafts, initial encounter: Secondary | ICD-10-CM | POA: Diagnosis not present

## 2023-07-01 ENCOUNTER — Other Ambulatory Visit: Payer: Self-pay | Admitting: Family Medicine

## 2023-07-01 DIAGNOSIS — R0602 Shortness of breath: Secondary | ICD-10-CM | POA: Diagnosis not present

## 2023-07-01 DIAGNOSIS — R635 Abnormal weight gain: Secondary | ICD-10-CM | POA: Diagnosis not present

## 2023-07-02 DIAGNOSIS — Q66222 Congenital metatarsus adductus, left foot: Secondary | ICD-10-CM | POA: Diagnosis not present

## 2023-07-02 DIAGNOSIS — G621 Alcoholic polyneuropathy: Secondary | ICD-10-CM | POA: Diagnosis not present

## 2023-07-02 DIAGNOSIS — Q6671 Congenital pes cavus, right foot: Secondary | ICD-10-CM | POA: Diagnosis not present

## 2023-07-02 DIAGNOSIS — L97522 Non-pressure chronic ulcer of other part of left foot with fat layer exposed: Secondary | ICD-10-CM | POA: Diagnosis not present

## 2023-07-04 ENCOUNTER — Other Ambulatory Visit: Payer: Self-pay | Admitting: Family Medicine

## 2023-07-05 ENCOUNTER — Encounter: Payer: Self-pay | Admitting: Oncology

## 2023-07-12 ENCOUNTER — Other Ambulatory Visit: Payer: Self-pay

## 2023-07-12 ENCOUNTER — Inpatient Hospital Stay: Payer: Medicare HMO | Attending: Oncology

## 2023-07-12 ENCOUNTER — Inpatient Hospital Stay: Payer: Medicare HMO

## 2023-07-12 VITALS — BP 98/63 | HR 80 | Temp 98.0°F | Resp 18

## 2023-07-12 DIAGNOSIS — D631 Anemia in chronic kidney disease: Secondary | ICD-10-CM

## 2023-07-12 DIAGNOSIS — D72829 Elevated white blood cell count, unspecified: Secondary | ICD-10-CM | POA: Insufficient documentation

## 2023-07-12 DIAGNOSIS — D500 Iron deficiency anemia secondary to blood loss (chronic): Secondary | ICD-10-CM

## 2023-07-12 DIAGNOSIS — N189 Chronic kidney disease, unspecified: Secondary | ICD-10-CM | POA: Insufficient documentation

## 2023-07-12 DIAGNOSIS — Z79899 Other long term (current) drug therapy: Secondary | ICD-10-CM | POA: Diagnosis not present

## 2023-07-12 LAB — CBC WITH DIFFERENTIAL (CANCER CENTER ONLY)
Abs Immature Granulocytes: 0.07 10*3/uL (ref 0.00–0.07)
Basophils Absolute: 0.1 10*3/uL (ref 0.0–0.1)
Basophils Relative: 1 %
Eosinophils Absolute: 0.3 10*3/uL (ref 0.0–0.5)
Eosinophils Relative: 3 %
HCT: 32 % — ABNORMAL LOW (ref 36.0–46.0)
Hemoglobin: 10.5 g/dL — ABNORMAL LOW (ref 12.0–15.0)
Immature Granulocytes: 1 %
Lymphocytes Relative: 16 %
Lymphs Abs: 1.7 10*3/uL (ref 0.7–4.0)
MCH: 29.2 pg (ref 26.0–34.0)
MCHC: 32.8 g/dL (ref 30.0–36.0)
MCV: 88.9 fL (ref 80.0–100.0)
Monocytes Absolute: 0.8 10*3/uL (ref 0.1–1.0)
Monocytes Relative: 7 %
Neutro Abs: 7.6 10*3/uL (ref 1.7–7.7)
Neutrophils Relative %: 72 %
Platelet Count: 358 10*3/uL (ref 150–400)
RBC: 3.6 MIL/uL — ABNORMAL LOW (ref 3.87–5.11)
RDW: 14.4 % (ref 11.5–15.5)
WBC Count: 10.6 10*3/uL — ABNORMAL HIGH (ref 4.0–10.5)
nRBC: 0 % (ref 0.0–0.2)
nRBC: 0 /100{WBCs}

## 2023-07-12 MED ORDER — EPOETIN ALFA-EPBX 40000 UNIT/ML IJ SOLN
40000.0000 [IU] | Freq: Once | INTRAMUSCULAR | Status: AC
  Administered 2023-07-12: 40000 [IU] via SUBCUTANEOUS
  Filled 2023-07-12: qty 1

## 2023-07-12 NOTE — Patient Instructions (Signed)

## 2023-07-16 DIAGNOSIS — N1832 Chronic kidney disease, stage 3b: Secondary | ICD-10-CM | POA: Diagnosis not present

## 2023-07-18 ENCOUNTER — Other Ambulatory Visit: Payer: Self-pay | Admitting: Family Medicine

## 2023-07-19 ENCOUNTER — Telehealth: Payer: Self-pay

## 2023-07-19 ENCOUNTER — Other Ambulatory Visit: Payer: Self-pay

## 2023-07-19 ENCOUNTER — Encounter: Payer: Self-pay | Admitting: Family Medicine

## 2023-07-19 NOTE — Telephone Encounter (Signed)
Patient had labs done at the nephrologist on 07/12/24 in care everywhere. Please advise

## 2023-07-19 NOTE — Telephone Encounter (Signed)
Copied from CRM [id number]. Topic: Clinical - Lab/Test Results >> Jul 19, 2023  2:52 PM Alvino Blood C wrote: Reason for CRM: Patient says that her calcium is really high and it was normal at her appointment. Her potasium has dropped.

## 2023-07-20 ENCOUNTER — Other Ambulatory Visit: Payer: Self-pay | Admitting: Family Medicine

## 2023-07-20 MED ORDER — SPIRONOLACTONE 50 MG PO TABS: 100.0000 mg | ORAL_TABLET | Freq: Two times a day (BID) | ORAL | 0 refills | Status: DC

## 2023-07-20 MED ORDER — TRAMADOL HCL 50 MG PO TABS: 100.0000 mg | ORAL_TABLET | Freq: Two times a day (BID) | ORAL | 1 refills | Status: DC

## 2023-07-22 ENCOUNTER — Ambulatory Visit: Payer: Self-pay | Admitting: Family Medicine

## 2023-07-22 ENCOUNTER — Encounter: Payer: Self-pay | Admitting: Family Medicine

## 2023-07-22 ENCOUNTER — Telehealth: Payer: Self-pay

## 2023-07-22 NOTE — Telephone Encounter (Signed)
 Please advice.  Copied from CRM [id number]. Topic: Clinical - Medical Advice >> Jul 20, 2023  3:59 PM Carmell R wrote: Reason for CRM: Pt had labs done and it was found that her potassium was low, calcium was high 11.3. Needs to know if she should be prescribed something to supplement her potassium and looking to further discuss her calcium. Cb# [phone]

## 2023-07-22 NOTE — Telephone Encounter (Signed)
 Chief Complaint: Dizziness Symptoms: Dizziness, SOB, fatigued, falls, weakness Frequency: constant for last 4 weeks Disposition: [x] ED /[] Urgent Care (no appt availability in office) / [] Appointment(In office/virtual)/ []  [facility]/ [] Home Care/ [] Refused Recommended Disposition /[] [place] [facility]/ []  Follow-up with PCP Additional Notes: Patient called in stating for the last 4 weeks she has been experiencing dizziness, fatigue, shortness of breath, feeling colder than normal, and has fallen at least 6 times in the last 4 weeks. Patient stateslast time I had labs done was on the 6th of December by Dr. Sherre and then again on the 26th of December by my nephrologist and they were way off. This RN asked patient if she has been able to check her blood pressure at home and she stated yes and it has been lower than normal, I already run low. This RN asked patient about any fainting episodes and patient stated about two weeks ago when she fell she believes she fainted and hit the back of her head and was out for a while, and her husband tried to get her to go to the ER but she refused to go. This RN advised patient to be seen at Emergency Room asap. This RN asked patient and confirmed that she has a driver to take her to the ED now, she stated her husband can take her or her daughter can take her. Patient agreed to going now to ED to be evaluated.   Copied from CRM [id number]. Topic: Clinical - Red Word Triage >> Jul 22, 2023  4:42 PM Chase C wrote: Patient has called in to schedule an appointment to see the doctor because she has been very dizzy, shortness breath, have been falling frequently at least 6 times in the past 4 weeks and very fatigue. Reason for Disposition  SEVERE dizziness (e.g., unable to stand, requires support to walk, feels like passing out now)  Answer Assessment - Initial Assessment Questions 1. DESCRIPTION: Describe your dizziness.     I'm walking and  lightheaded and kind of spinning, have to get somewhere where I can hold on to something 2. LIGHTHEADED: Do you feel lightheaded? (e.g., somewhat faint, woozy, weak upon standing)     Yes, spinning, weak 4. SEVERITY: How bad is it?  Do you feel like you are going to faint? Can you stand and walk?   - MILD: Feels slightly dizzy, but walking normally.   - MODERATE: Feels unsteady when walking, but not falling; interferes with normal activities (e.g., school, work).   - SEVERE: Unable to walk without falling, or requires assistance to walk without falling; feels like passing out now.      Normally I don't have to use anything in the house, but not at home. But lately I have been having to use assistance 5. ONSET:  When did the dizziness begin?     About a month ago 6. AGGRAVATING FACTORS: Does anything make it worse? (e.g., standing, change in head position)     BP has been running lower than normal, Nothing I notice makes it worse 8. CAUSE: What do you think is causing the dizziness?     No 9. RECURRENT SYMPTOM: Have you had dizziness before? If Yes, ask: When was the last time? What happened that time?     I've had them in the past but not spring like this all of the sudden.  10. OTHER SYMPTOMS: Do you have any other symptoms? (e.g., fever, chest pain, vomiting, diarrhea, bleeding)  No  Protocols used: Dizziness - Lightheadedness-A-AH

## 2023-07-23 ENCOUNTER — Ambulatory Visit: Payer: Medicare HMO | Admitting: Family Medicine

## 2023-07-23 ENCOUNTER — Encounter: Payer: Self-pay | Admitting: Family Medicine

## 2023-07-23 ENCOUNTER — Other Ambulatory Visit: Payer: Medicare HMO

## 2023-07-23 VITALS — BP 102/68 | HR 81 | Temp 97.8°F | Ht 66.0 in | Wt 154.0 lb

## 2023-07-23 DIAGNOSIS — I959 Hypotension, unspecified: Secondary | ICD-10-CM

## 2023-07-23 DIAGNOSIS — R42 Dizziness and giddiness: Secondary | ICD-10-CM | POA: Diagnosis not present

## 2023-07-23 LAB — CBC WITH DIFFERENTIAL/PLATELET
Basophils Absolute: 0.1 10*3/uL (ref 0.0–0.2)
Basos: 1 %
EOS (ABSOLUTE): 0.3 10*3/uL (ref 0.0–0.4)
Eos: 3 %
Hematocrit: 36.4 % (ref 34.0–46.6)
Hemoglobin: 11.4 g/dL (ref 11.1–15.9)
Immature Grans (Abs): 0 10*3/uL (ref 0.0–0.1)
Immature Granulocytes: 0 %
Lymphocytes Absolute: 1.6 10*3/uL (ref 0.7–3.1)
Lymphs: 17 %
MCH: 28.4 pg (ref 26.6–33.0)
MCHC: 31.3 g/dL — ABNORMAL LOW (ref 31.5–35.7)
MCV: 91 fL (ref 79–97)
Monocytes Absolute: 0.7 10*3/uL (ref 0.1–0.9)
Monocytes: 8 %
Neutrophils Absolute: 6.9 10*3/uL (ref 1.4–7.0)
Neutrophils: 71 %
Platelets: 315 10*3/uL (ref 150–450)
RBC: 4.02 x10E6/uL (ref 3.77–5.28)
RDW: 13.4 % (ref 11.7–15.4)
WBC: 9.7 10*3/uL (ref 3.4–10.8)

## 2023-07-23 LAB — POCT URINALYSIS DIP (CLINITEK)
Bilirubin, UA: NEGATIVE
Glucose, UA: NEGATIVE mg/dL
Ketones, POC UA: NEGATIVE mg/dL
Leukocytes, UA: NEGATIVE
Nitrite, UA: NEGATIVE
POC PROTEIN,UA: NEGATIVE
Spec Grav, UA: 1.025 (ref 1.010–1.025)
Urobilinogen, UA: NEGATIVE U/dL — AB
pH, UA: 7 (ref 5.0–8.0)

## 2023-07-23 MED ORDER — MIDODRINE HCL 2.5 MG PO TABS: 2.5000 mg | ORAL_TABLET | Freq: Three times a day (TID) | ORAL | 0 refills | Status: DC

## 2023-07-23 NOTE — Assessment & Plan Note (Signed)
 Check labs. History of anemia.

## 2023-07-23 NOTE — Progress Notes (Signed)
 Acute Office Visit  Subjective:    Patient ID: Molly Parrish, female    DOB: 08-18-1965, 58 y.o.   MRN: [id number]  Chief Complaint  Patient presents with   Dizzy    Discussed the use of AI scribe software for clinical note transcription with the patient, who gave verbal consent to proceed.  HPI: Patient is a 58 year old white female with past medical history of alcoholic liver cirrhosis, portal hypertension, hypotension, hypocalcemia, hyponatremia, hypokalemia and anemia, who was in our office for her husband's appointment.  She was scheduled to get lab work at her endocrinologist this morning which is rather far away.  She asked if she could get it here.  I agreed as long as she could call and get the correct order.  While she is waiting she became dizzy and requested to be seen. She has a longstanding history of hypocalcemia which has been improving with treatment.  Dr. Claudene had gotten chemistry panel approximately 1 week ago and her calcium was 11.2.  This is very unusual.  I had recently agreed to have her get her Prolia  because her calcium was in the normal range but she has not gotten this yet.  While waiting she became dizzy.  She has she reported that she had been dizzy over the last 1 to 2 days and perhaps even up to a week.  This is not an uncommon complaint for her.  It had worsened and she spoke with our triage nurse yesterday who had recommended she go to the emergency department.  She did not go to the emergency department.  She does have shortness of breath with mild exertion.  She reports the dizziness is being lightheaded and sometimes a sensation of spinning.  She has some chronic back pain.  She is also complaining of decreased urine output despite adequate fluid intake.  She consumes 3 bottles of water a day and a couple coffee but reports difficulty with urination.  She also has some new onset of lower abdominal pain.  In addition, Dagmar has a painful ulcer on her  toe that has been present since September. Despite trying various over-the-counter treatments, the ulcer has not healed and continues to cause significant discomfort.  Past Medical History:  Diagnosis Date   Alcohol dependence in remission (HCC) 10/04/2013   Alcoholic cirrhosis of liver (HCC)    Anemia    Anemia of chronic disease 08/08/2016   Arterial hypotension 05/08/2020   Bipolar disorder current episode depressed (HCC)    Brain TIA 02/19/2020   CKD (chronic kidney disease)    Episodic mood disorder (HCC) 11/07/2013   Generalized anxiety disorder    Generalized weakness 07/11/2020   GERD (gastroesophageal reflux disease)    H/O bariatric surgery 06/11/2020   Hypocalcemia 12/10/2015   Hypokalemia    Hyponatremia 02/19/2020   Intestinal malabsorption    Iron  deficiency anemia due to chronic blood loss 05/08/2020   Korsakoff syndrome (HCC)    Lumbar disc disease 10/19/2019   Microscopic hematuria 04/16/2021   Migraine 04/20/2014   Mild recurrent major depression (HCC) 04/19/2020   Multiple closed fractures of ribs of left side 06/03/2020   Formatting of this note might be different from the original. Added automatically from request for surgery [id number]   Neuropathy 01/31/2020   Obsessive-compulsive disorder 11/07/2013   Other osteoporosis without current pathological fracture    Peripheral edema 11/01/2020   Polypharmacy 06/12/2020   Portal hypertension (HCC)    Post-surgical hypothyroidism 12/10/2015  Primary insomnia 03/26/2021   Severe protein-calorie malnutrition (HCC) 03/26/2021   Shortness of breath 11/01/2020   Stage 3b chronic kidney disease (HCC) 04/16/2021   Stroke (HCC)     left basal ganglia stroke (hemorrhagic)   Telogen effluvium 03/26/2021   Ulcer of left foot with fat layer exposed (HCC) 10/04/2020   Ulcer of right foot, with fat layer exposed (HCC) 10/10/2020   Vitamin D  deficiency     Past Surgical History:  Procedure Laterality Date   BREAST EXCISIONAL BIOPSY Left     CATARACT EXTRACTION     GASTRIC BYPASS  2005   HEMORRHOID SURGERY     HERNIA REPAIR     metatarsus abductus surgery Left 1990   PARTIAL HYSTERECTOMY  05/1997   BL Ovaries remain. Done for fibroids.   THYROIDECTOMY  03/2013    Family History  Problem Relation Age of Onset   Breast cancer Mother    Osteoporosis Mother    Cancer Mother        Breast, lung, skin.    Breast cancer Maternal Grandmother     Social History   Socioeconomic History   Marital status: Married    Spouse name: Not on file   Number of children: 1   Years of education: Not on file   Highest education level: Associate degree: academic program  Occupational History   Occupation: IT TRAINER    Comment: Retired.  Tobacco Use   Smoking status: Never   Smokeless tobacco: Never  Substance and Sexual Activity   Alcohol use: Not Currently    Comment: history of alcoholism. sober since 2014.   Drug use: Never   Sexual activity: Yes  Other Topics Concern   Not on file  Social History Narrative   Right handed   Social Drivers of Health   Financial Resource Strain: Medium Risk (06/24/2023)   Overall Financial Resource Strain (CARDIA)    Difficulty of Paying Living Expenses: Somewhat hard  Food Insecurity: Unknown (07/02/2023)   Received from [facility]   Hunger Vital Sign    Worried About Running Out of Food in the Last Year: Patient declined to answer    Ran Out of Food in the Last Year: Patient declined to answer  Transportation Needs: Not on file (07/02/2023)  Physical Activity: Insufficiently Active (06/24/2023)   Exercise Vital Sign    Days of Exercise per Week: 3 days    Minutes of Exercise per Session: 30 min  Stress: Stress Concern Present (06/24/2023)   [employer] of Occupational Health - Occupational Stress Questionnaire    Feeling of Stress : Very much  Social Connections: Unknown (06/24/2023)   Social Connection and Isolation Panel [NHANES]    Frequency of Communication with Friends and  Family: More than three times a week    Frequency of Social Gatherings with Friends and Family: Twice a week    Attends Religious Services: Patient declined    Database Administrator or Organizations: No    Attends Banker Meetings: Never    Marital Status: Married  Catering Manager Violence: Not At Risk (05/20/2021)   Humiliation, Afraid, Rape, and Kick questionnaire    Fear of Current or Ex-Partner: No    Emotionally Abused: No    Physically Abused: No    Sexually Abused: No    Outpatient Medications Prior to Visit  Medication Sig Dispense Refill   acetaminophen  (TYLENOL ) 325 MG tablet Take by mouth.     ALPRAZolam  (XANAX ) 0.25 MG tablet Take 1  tablet by mouth twice daily as needed for anxiety 60 tablet 0   budesonide -formoterol  (SYMBICORT ) 160-4.5 MCG/ACT inhaler Inhale 2 puffs into the lungs 2 (two) times daily. 10.2 g 2   calcitRIOL (ROCALTROL) 0.5 MCG capsule Take 0.5 mcg by mouth 2 (two) times daily.     calcium carbonate (OSCAL) 1500 (600 Ca) MG TABS tablet Take 3,600 mg by mouth 2 (two) times daily with a meal.     CONSTULOSE 10 GM/15ML solution Take 20 g by mouth 2 (two) times daily.     Cyanocobalamin  (VITAMIN B 12 PO) Take 1,000 mcg by mouth daily.     cyclobenzaprine  (FLEXERIL ) 10 MG tablet Take 1 tablet by mouth three times daily as needed for muscle spasm 90 tablet 0   famotidine  (PEPCID ) 40 MG tablet Take 40 mg by mouth at bedtime.     furosemide  (LASIX ) 40 MG tablet Take 2 tablets (80 mg total) by mouth 3 (three) times daily between meals. 540 tablet 0   gabapentin  (NEURONTIN ) 100 MG capsule TAKE 1 CAPSULE(100 MG) BY MOUTH THREE TIMES DAILY 90 capsule 1   Glucagon  (GVOKE HYPOPEN  2-PACK) 0.5 MG/0.1ML SOAJ If glucose less than 60, use gvoke pen x 1. May repeat if not responding x 1 daily. Call EMS if not improving. 0.2 mL 2   hydrOXYzine  (VISTARIL ) 25 MG capsule TAKE 1 CAPSULE BY MOUTH THREE TIMES DAILY 90 capsule 5   Iron -Vitamins (GERITOL COMPLETE) TABS Take  1 tablet by mouth daily.     lamoTRIgine (LAMICTAL) 200 MG tablet Take 200 mg by mouth at bedtime.     levothyroxine  (SYNTHROID ) 150 MCG tablet Take 1 tablet (150 mcg total) by mouth daily before breakfast. 90 tablet 0   magnesium oxide (MAG-OX) 400 MG tablet Take 400 mg by mouth daily.     omeprazole (PRILOSEC) 40 MG capsule Take 40 mg by mouth daily.     ondansetron  (ZOFRAN ) 4 MG tablet TAKE 1 TABLET(4 MG) BY MOUTH EVERY 8 HOURS AS NEEDED FOR NAUSEA OR VOMITING 60 tablet 3   pantoprazole  (PROTONIX ) 40 MG tablet TAKE 1 TABLET(40 MG) BY MOUTH DAILY 90 tablet 0   phentermine (ADIPEX-P) 37.5 MG tablet Take 37.5 mg by mouth every morning.     promethazine (PHENERGAN) 25 MG tablet TAKE 1 TABLET BY MOUTH FOUR TIMES DAILY AS NEEDED FOR NAUSEA OR VOMITING 40 tablet 2   propranolol (INDERAL) 10 MG tablet Take 10 mg by mouth at bedtime.     QUEtiapine (SEROQUEL) 50 MG tablet Take 100 mg by mouth at bedtime.     rifaximin (XIFAXAN) 550 MG TABS tablet Take 550 mg by mouth 2 (two) times daily.     sertraline (ZOLOFT) 100 MG tablet Take 100 mg by mouth in the morning and at bedtime.     sevelamer carbonate (RENVELA) 800 MG tablet Take 800 mg by mouth 3 (three) times daily with meals.     spironolactone  (ALDACTONE ) 50 MG tablet Take 2 tablets (100 mg total) by mouth 2 (two) times daily. 120 tablet 0   thiamine 100 MG tablet Take 100 mg by mouth daily.     topiramate (TOPAMAX) 100 MG tablet Take 100 mg by mouth at bedtime.     traMADol  (ULTRAM ) 50 MG tablet TAKE 2 TABLETS BY MOUTH TWICE DAILY 120 tablet 1   triamcinolone  (KENALOG ) 0.1 % paste USE AS DIRECTED IN THE MOUTH OR THROAT TWICE DAILY. 5 g 2   Vitamin D , Ergocalciferol , (DRISDOL ) 1.25 MG (50000 UNIT) CAPS capsule  Take 1 capsule (50,000 Units total) by mouth 2 (two) times a week. 24 capsule 0   zolpidem  (AMBIEN ) 10 MG tablet Take 1 tablet (10 mg total) by mouth at bedtime. 30 tablet 3   nitrofurantoin , macrocrystal-monohydrate, (MACROBID ) 100 MG capsule  Take 1 capsule (100 mg total) by mouth 2 (two) times daily. 14 capsule 0   No facility-administered medications prior to visit.    Allergies  Allergen Reactions   Cholestyramine     Rash and itching   Trintellix [Vortioxetine]     Review of Systems  Constitutional:  Negative for appetite change, fatigue and fever.  HENT:  Negative for congestion, ear pain, sinus pressure and sore throat.   Respiratory:  Negative for cough, chest tightness, shortness of breath and wheezing.   Cardiovascular:  Negative for chest pain and palpitations.  Gastrointestinal:  Negative for abdominal pain, constipation, diarrhea, nausea and vomiting.  Genitourinary:  Negative for dysuria and hematuria.  Musculoskeletal:  Negative for arthralgias, back pain, joint swelling and myalgias.  Skin:  Negative for rash.  Neurological:  Positive for dizziness. Negative for weakness and headaches.  Psychiatric/Behavioral:  Negative for dysphoric mood. The patient is not nervous/anxious.        Objective:        07/23/2023   10:44 AM 07/23/2023   10:26 AM 07/12/2023   11:17 AM  Vitals with BMI  Height  5' 6   Weight  154 lbs   BMI  24.87   Systolic 102 76 98  Diastolic 68 42 63  Pulse 81 86 80    Orthostatic VS for the past 72 hrs (Last 3 readings):  Patient Position BP Location  07/23/23 1044 Sitting Right Arm  07/23/23 1026 Sitting Right Arm     Physical Exam Vitals reviewed.  Constitutional:      Appearance: Normal appearance. She is normal weight.  HENT:     Mouth/Throat:   Neck:     Vascular: No carotid bruit.  Cardiovascular:     Rate and Rhythm: Normal rate and regular rhythm.     Heart sounds: Normal heart sounds.  Pulmonary:     Effort: Pulmonary effort is normal. No respiratory distress.     Breath sounds: Normal breath sounds.  Abdominal:     General: Abdomen is flat. Bowel sounds are normal.     Palpations: Abdomen is soft.     Tenderness: There is no abdominal tenderness.   Neurological:     Mental Status: She is alert and oriented to person, place, and time.  Psychiatric:        Mood and Affect: Mood normal.        Behavior: Behavior normal.     Health Maintenance Due  Topic Date Due   Medicare Annual Wellness (AWV)  Never done   Zoster Vaccines- Shingrix (2 of 2) 10/21/2020   DTaP/Tdap/Td (2 - Td or Tdap) 10/19/2022   INFLUENZA VACCINE  02/18/2023    There are no preventive care reminders to display for this patient.   Lab Results  Component Value Date   TSH 30.000 (H) 05/06/2023   Lab Results  Component Value Date   WBC 10.6 (H) 07/12/2023   HGB 10.5 (L) 07/12/2023   HCT 32.0 (L) 07/12/2023   MCV 88.9 07/12/2023   PLT 358 07/12/2023   Lab Results  Component Value Date   NA 139 06/25/2023   K 4.5 06/25/2023   CO2 30 (H) 06/25/2023   GLUCOSE 60 (L) 06/25/2023  BUN 30 (H) 06/25/2023   CREATININE 1.52 (H) 06/25/2023   BILITOT 0.3 06/25/2023   ALKPHOS 172 (H) 06/25/2023   AST 22 06/25/2023   ALT 18 06/25/2023   PROT 5.5 (L) 06/25/2023   ALBUMIN 3.2 (L) 06/25/2023   CALCIUM 9.2 06/25/2023   ANIONGAP 10 05/13/2023   EGFR 39 (L) 06/25/2023   Lab Results  Component Value Date   CHOL 121 03/01/2023   Lab Results  Component Value Date   HDL 68 03/01/2023   Lab Results  Component Value Date   LDLCALC 39 03/01/2023   Lab Results  Component Value Date   TRIG 65 03/01/2023   Lab Results  Component Value Date   CHOLHDL 1.8 03/01/2023   No results found for: HGBA1C     Assessment & Plan:  Dizzy Assessment & Plan: Check labs. History of anemia.   Orders: -     POCT URINALYSIS DIP (CLINITEK) -     CBC with Differential/Platelet  Hypotension, unspecified hypotension type Assessment & Plan: Start on midodrine  2.5 mg three times a day.   Orders: -     Midodrine  HCl; Take 1 tablet (2.5 mg total) by mouth 3 (three) times daily with meals.  Dispense: 90 tablet; Refill: 0 -     CBC with  Differential/Platelet  Hypercalcemia Assessment & Plan: CMP was drawn and sent to endocrinology.  Likely will need calcium supplementation decreased.   Orders: -     Comprehensive metabolic panel     Meds ordered this encounter  Medications   midodrine  (PROAMATINE ) 2.5 MG tablet    Sig: Take 1 tablet (2.5 mg total) by mouth 3 (three) times daily with meals.    Dispense:  90 tablet    Refill:  0    Orders Placed This Encounter  Procedures   CBC with Differential/Platelet   Comprehensive metabolic panel   POCT URINALYSIS DIP (CLINITEK)    Total time spent on today's visit was greater than 34 minutes, including both face-to-face time and nonface-to-face time personally spent on review of chart (labs and imaging), discussing labs and goals, discussing further work-up, treatment options, referrals to specialist if needed, reviewing outside records of pertinent, answering patient's questions, and coordinating care.  Follow-up: Return in about 2 weeks (around 08/06/2023) for Nurse visit BP CHECK.  An After Visit Summary was printed and given to the patient.  I,Lauren M Auman,acting as a scribe for Abigail Free, MD.,have documented all relevant documentation on the behalf of Abigail Free, MD,as directed by  Abigail Free, MD while in the presence of Abigail Free, MD.    I attest that I have reviewed this visit and agree with the plan scribed by my staff.   Abigail Free, MD [facility] [phone]

## 2023-07-23 NOTE — Assessment & Plan Note (Signed)
 CMP was drawn and sent to endocrinology.  Likely will need calcium supplementation decreased.

## 2023-07-23 NOTE — Assessment & Plan Note (Signed)
 Start on midodrine 2.5 mg three times a day.

## 2023-07-23 NOTE — Patient Instructions (Signed)
 Hypotension: Start on midodrine 2.5 mg three times a day.  Check labs.

## 2023-07-24 LAB — BASIC METABOLIC PANEL
BUN/Creatinine Ratio: 17 (ref 9–23)
BUN: 30 mg/dL — ABNORMAL HIGH (ref 6–24)
CO2: 26 mmol/L (ref 20–29)
Calcium: 9.4 mg/dL (ref 8.7–10.2)
Chloride: 98 mmol/L (ref 96–106)
Creatinine, Ser: 1.75 mg/dL — ABNORMAL HIGH (ref 0.57–1.00)
Glucose: 84 mg/dL (ref 70–99)
Potassium: 4.4 mmol/L (ref 3.5–5.2)
Sodium: 139 mmol/L (ref 134–144)
eGFR: 33 mL/min/{1.73_m2} — ABNORMAL LOW (ref >59)

## 2023-07-31 ENCOUNTER — Other Ambulatory Visit: Payer: Self-pay | Admitting: Family Medicine

## 2023-08-02 ENCOUNTER — Ambulatory Visit (INDEPENDENT_AMBULATORY_CARE_PROVIDER_SITE_OTHER): Payer: Medicare HMO

## 2023-08-02 ENCOUNTER — Ambulatory Visit: Payer: Self-pay | Admitting: Family Medicine

## 2023-08-02 VITALS — BP 98/68 | HR 78 | Temp 98.3°F | Ht 66.0 in | Wt 151.0 lb

## 2023-08-02 DIAGNOSIS — R052 Subacute cough: Secondary | ICD-10-CM

## 2023-08-02 DIAGNOSIS — R0789 Other chest pain: Secondary | ICD-10-CM | POA: Diagnosis not present

## 2023-08-02 DIAGNOSIS — R42 Dizziness and giddiness: Secondary | ICD-10-CM | POA: Diagnosis not present

## 2023-08-02 DIAGNOSIS — R0981 Nasal congestion: Secondary | ICD-10-CM | POA: Diagnosis not present

## 2023-08-02 DIAGNOSIS — J209 Acute bronchitis, unspecified: Secondary | ICD-10-CM

## 2023-08-02 MED ORDER — BUDESONIDE-FORMOTEROL FUMARATE 160-4.5 MCG/ACT IN AERO: 2.0000 | INHALATION_SPRAY | Freq: Two times a day (BID) | RESPIRATORY_TRACT | 2 refills | Status: DC

## 2023-08-02 NOTE — Telephone Encounter (Signed)
  Chief Complaint: cough Symptoms: cough, green mucus, fever, wheezing, headache, body aches Frequency: since this weekend Pertinent Negatives: Patient denies pulmonary hx, bloody mucus Disposition: [] ED /[] Urgent Care (no appt availability in office) / [x] Appointment(In office/virtual)/ []  [place] [facility]/ [] Home Care/ [] Refused Recommended Disposition /[] [facility]/ []  Follow-up with PCP Additional Notes: Patient reports that she has been having cough and cold symptoms since this weekend. Patient reports severe cough, green mucus, fever, wheezing, headache, and body aches. Patient reports she is sob at baseline, but it feels worse than normal. Per protocol, this RN scheduled in office appt today 1/13. Patient advised to call back with worsening symptoms. Patient verbalized understanding.      Reason for Disposition  [1] MILD difficulty breathing (e.g., minimal/no SOB at rest, SOB with walking, pulse <100) AND [2] still present when not coughing  Answer Assessment - Initial Assessment Questions 1. ONSET: When did the cough begin?      This past weekend 2. SEVERITY: How bad is the cough today?      severe 3. SPUTUM: Describe the color of your sputum (none, dry cough; clear, white, yellow, green)     green 4. HEMOPTYSIS: Are you coughing up any blood? If so ask: How much? (flecks, streaks, tablespoons, etc.)     none 5. DIFFICULTY BREATHING: Are you having difficulty breathing? If Yes, ask: How bad is it? (e.g., mild, moderate, severe)    - MILD: No SOB at rest, mild SOB with walking, speaks normally in sentences, can lie down, no retractions, pulse < 100.    - MODERATE: SOB at rest, SOB with minimal exertion and prefers to sit, cannot lie down flat, speaks in phrases, mild retractions, audible wheezing, pulse 100-120.    - SEVERE: Very SOB at rest, speaks in single words, struggling to breathe, sitting hunched forward, retractions, pulse > 120      mild 6.  FEVER: Do you have a fever? If Yes, ask: What is your temperature, how was it measured, and when did it start?     102.4 7. CARDIAC HISTORY: Do you have any history of heart disease? (e.g., heart attack, congestive heart failure)      Hx of stroke 8. LUNG HISTORY: Do you have any history of lung disease?  (e.g., pulmonary embolus, asthma, emphysema)     none 9. PE RISK FACTORS: Do you have a history of blood clots? (or: recent major surgery, recent prolonged travel, bedridden)     none 10. OTHER SYMPTOMS: Do you have any other symptoms? (e.g., runny nose, wheezing, chest pain)       Wheezing, headache, body aches, fever  Protocols used: Cough - Acute Non-Productive-A-AH

## 2023-08-02 NOTE — Progress Notes (Signed)
 Acute Office Visit  Subjective:    Patient ID: Molly Parrish, female    DOB: 1966-05-10, 58 y.o.   MRN: [id number]  Chief Complaint  Patient presents with   Cough   Nasal Congestion    Discussed the use of AI scribe software for clinical note transcription with the patient, who gave verbal consent to proceed.      HPI: Patient is in today for cough, nasal congestion and weakness. Patient states she has the congestion has been going for a couple weeks, the coughing started Friday. She states she was taking over the counter medication before the cough started.  The patient, with a history of respiratory problems, presents with a worsening cough that began on a Friday. She initially attributed the cough to allergies and treated it with over the counter allergy and cough medicine. However, the cough worsened, particularly at night, leading her to take Robitussin. Concurrently, she experienced increasing instability, to the point of needing assistance to walk and being unable to rise independently. She also reported a new onset of chest wall pain that began on Saturday, described as tender to touch, spanning from the left breast to the midline. The patient denies any significant coughing episodes that could have resulted in a broken rib.  The patient also reported nasal congestion, which she believes has improved. She has been managing her respiratory symptoms with Symbicort . She also reported a recent episode of dizziness after taking cough medicine, which was a new experience for her.  The patient has a history of alcohol use for 11 years and has been managing her pain with tramadol . She recently had blood work done, which showed stable kidney function and normal blood counts.  Past Medical History:  Diagnosis Date   Alcohol dependence in remission (HCC) 10/04/2013   Alcoholic cirrhosis of liver (HCC)    Anemia    Anemia of chronic disease 08/08/2016   Arterial hypotension  05/08/2020   Bipolar disorder current episode depressed (HCC)    Brain TIA 02/19/2020   CKD (chronic kidney disease)    Episodic mood disorder (HCC) 11/07/2013   Generalized anxiety disorder    Generalized weakness 07/11/2020   GERD (gastroesophageal reflux disease)    H/O bariatric surgery 06/11/2020   Hypocalcemia 12/10/2015   Hypokalemia    Hyponatremia 02/19/2020   Intestinal malabsorption    Iron  deficiency anemia due to chronic blood loss 05/08/2020   Korsakoff syndrome (HCC)    Lumbar disc disease 10/19/2019   Microscopic hematuria 04/16/2021   Migraine 04/20/2014   Mild recurrent major depression (HCC) 04/19/2020   Multiple closed fractures of ribs of left side 06/03/2020   Formatting of this note might be different from the original. Added automatically from request for surgery [id number]   Neuropathy 01/31/2020   Obsessive-compulsive disorder 11/07/2013   Other osteoporosis without current pathological fracture    Peripheral edema 11/01/2020   Polypharmacy 06/12/2020   Portal hypertension (HCC)    Post-surgical hypothyroidism 12/10/2015   Primary insomnia 03/26/2021   Severe protein-calorie malnutrition (HCC) 03/26/2021   Shortness of breath 11/01/2020   Stage 3b chronic kidney disease (HCC) 04/16/2021   Stroke (HCC)     left basal ganglia stroke (hemorrhagic)   Telogen effluvium 03/26/2021   Ulcer of left foot with fat layer exposed (HCC) 10/04/2020   Ulcer of right foot, with fat layer exposed (HCC) 10/10/2020   Vitamin D  deficiency     Past Surgical History:  Procedure Laterality Date   BREAST EXCISIONAL  BIOPSY Left    CATARACT EXTRACTION     GASTRIC BYPASS  2005   HEMORRHOID SURGERY     HERNIA REPAIR     metatarsus abductus surgery Left 1990   PARTIAL HYSTERECTOMY  05/1997   BL Ovaries remain. Done for fibroids.   THYROIDECTOMY  03/2013    Family History  Problem Relation Age of Onset   Breast cancer Mother    Osteoporosis Mother    Cancer Mother        Breast, lung, skin.     Breast cancer Maternal Grandmother     Social History   Socioeconomic History   Marital status: Married    Spouse name: Not on file   Number of children: 1   Years of education: Not on file   Highest education level: Associate degree: academic program  Occupational History   Occupation: IT TRAINER    Comment: Retired.  Tobacco Use   Smoking status: Never   Smokeless tobacco: Never  Substance and Sexual Activity   Alcohol use: Not Currently    Comment: history of alcoholism. sober since 2014.   Drug use: Never   Sexual activity: Yes  Other Topics Concern   Not on file  Social History Narrative   Right handed   Social Drivers of Health   Financial Resource Strain: Medium Risk (06/24/2023)   Overall Financial Resource Strain (CARDIA)    Difficulty of Paying Living Expenses: Somewhat hard  Food Insecurity: Unknown (07/02/2023)   Received from [facility]   Hunger Vital Sign    Worried About Running Out of Food in the Last Year: Patient declined to answer    Ran Out of Food in the Last Year: Patient declined to answer  Transportation Needs: Not on file (07/02/2023)  Physical Activity: Insufficiently Active (06/24/2023)   Exercise Vital Sign    Days of Exercise per Week: 3 days    Minutes of Exercise per Session: 30 min  Stress: Stress Concern Present (06/24/2023)   [employer] of Occupational Health - Occupational Stress Questionnaire    Feeling of Stress : Very much  Social Connections: Unknown (06/24/2023)   Social Connection and Isolation Panel [NHANES]    Frequency of Communication with Friends and Family: More than three times a week    Frequency of Social Gatherings with Friends and Family: Twice a week    Attends Religious Services: Patient declined    Database Administrator or Organizations: No    Attends Banker Meetings: Never    Marital Status: Married  Catering Manager Violence: Not At Risk (05/20/2021)   Humiliation, Afraid, Rape, and Kick  questionnaire    Fear of Current or Ex-Partner: No    Emotionally Abused: No    Physically Abused: No    Sexually Abused: No    Outpatient Medications Prior to Visit  Medication Sig Dispense Refill   acetaminophen  (TYLENOL ) 325 MG tablet Take by mouth.     ALPRAZolam  (XANAX ) 0.25 MG tablet Take 1 tablet by mouth twice daily as needed for anxiety 60 tablet 0   calcitRIOL (ROCALTROL) 0.5 MCG capsule Take 0.5 mcg by mouth 2 (two) times daily.     calcium carbonate (OSCAL) 1500 (600 Ca) MG TABS tablet Take 3,600 mg by mouth 2 (two) times daily with a meal.     CONSTULOSE 10 GM/15ML solution Take 20 g by mouth 2 (two) times daily.     Cyanocobalamin  (VITAMIN B 12 PO) Take 1,000 mcg by mouth daily.  cyclobenzaprine  (FLEXERIL ) 10 MG tablet Take 1 tablet by mouth three times daily as needed for muscle spasm 90 tablet 0   famotidine  (PEPCID ) 40 MG tablet Take 40 mg by mouth at bedtime.     furosemide  (LASIX ) 40 MG tablet Take 2 tablets by mouth twice daily 360 tablet 0   gabapentin  (NEURONTIN ) 100 MG capsule TAKE 1 CAPSULE(100 MG) BY MOUTH THREE TIMES DAILY 90 capsule 1   Glucagon  (GVOKE HYPOPEN  2-PACK) 0.5 MG/0.1ML SOAJ If glucose less than 60, use gvoke pen x 1. May repeat if not responding x 1 daily. Call EMS if not improving. 0.2 mL 2   hydrOXYzine  (VISTARIL ) 25 MG capsule TAKE 1 CAPSULE BY MOUTH THREE TIMES DAILY 90 capsule 5   Iron -Vitamins (GERITOL COMPLETE) TABS Take 1 tablet by mouth daily.     lamoTRIgine (LAMICTAL) 200 MG tablet Take 200 mg by mouth at bedtime.     levothyroxine  (SYNTHROID ) 150 MCG tablet Take 1 tablet (150 mcg total) by mouth daily before breakfast. 90 tablet 0   magnesium oxide (MAG-OX) 400 MG tablet Take 400 mg by mouth daily.     midodrine  (PROAMATINE ) 2.5 MG tablet Take 1 tablet (2.5 mg total) by mouth 3 (three) times daily with meals. 90 tablet 0   omeprazole (PRILOSEC) 40 MG capsule Take 40 mg by mouth daily.     ondansetron  (ZOFRAN ) 4 MG tablet TAKE 1 TABLET(4  MG) BY MOUTH EVERY 8 HOURS AS NEEDED FOR NAUSEA OR VOMITING 60 tablet 3   pantoprazole  (PROTONIX ) 40 MG tablet TAKE 1 TABLET(40 MG) BY MOUTH DAILY 90 tablet 0   phentermine (ADIPEX-P) 37.5 MG tablet Take 37.5 mg by mouth every morning.     promethazine (PHENERGAN) 25 MG tablet TAKE 1 TABLET BY MOUTH FOUR TIMES DAILY AS NEEDED FOR NAUSEA OR VOMITING 40 tablet 2   propranolol (INDERAL) 10 MG tablet Take 10 mg by mouth at bedtime.     QUEtiapine (SEROQUEL) 50 MG tablet Take 100 mg by mouth at bedtime.     rifaximin (XIFAXAN) 550 MG TABS tablet Take 550 mg by mouth 2 (two) times daily.     sertraline (ZOLOFT) 100 MG tablet Take 100 mg by mouth in the morning and at bedtime.     sevelamer carbonate (RENVELA) 800 MG tablet Take 800 mg by mouth 3 (three) times daily with meals.     spironolactone  (ALDACTONE ) 50 MG tablet Take 2 tablets (100 mg total) by mouth 2 (two) times daily. 120 tablet 0   thiamine 100 MG tablet Take 100 mg by mouth daily.     topiramate (TOPAMAX) 100 MG tablet Take 100 mg by mouth at bedtime.     traMADol  (ULTRAM ) 50 MG tablet TAKE 2 TABLETS BY MOUTH TWICE DAILY 120 tablet 1   triamcinolone  (KENALOG ) 0.1 % paste USE AS DIRECTED IN THE MOUTH OR THROAT TWICE DAILY. 5 g 2   Vitamin D , Ergocalciferol , (DRISDOL ) 1.25 MG (50000 UNIT) CAPS capsule Take 1 capsule (50,000 Units total) by mouth 2 (two) times a week. 24 capsule 0   zolpidem  (AMBIEN ) 10 MG tablet Take 1 tablet (10 mg total) by mouth at bedtime. 30 tablet 3   budesonide -formoterol  (SYMBICORT ) 160-4.5 MCG/ACT inhaler Inhale 2 puffs into the lungs 2 (two) times daily. 10.2 g 2   No facility-administered medications prior to visit.    Allergies  Allergen Reactions   Cholestyramine     Rash and itching   Trintellix [Vortioxetine]     Review of Systems  Constitutional:  Negative for appetite change, chills, fatigue and fever.  HENT:  Positive for congestion. Negative for ear discharge, ear pain, rhinorrhea, sinus  pressure, sneezing and sore throat.   Eyes:  Negative for visual disturbance.  Respiratory:  Positive for cough. Negative for chest tightness, shortness of breath and wheezing.   Cardiovascular:  Negative for chest pain, palpitations and leg swelling.  Gastrointestinal:  Negative for abdominal pain, diarrhea, nausea and vomiting.  Endocrine: Negative for polydipsia, polyphagia and polyuria.  Genitourinary:  Negative for difficulty urinating, dysuria, frequency, hematuria, menstrual problem, urgency, vaginal bleeding, vaginal discharge and vaginal pain.  Musculoskeletal:  Negative for back pain, gait problem, joint swelling, myalgias and neck pain.  Neurological:  Positive for weakness. Negative for dizziness, seizures, syncope, numbness and headaches.  Psychiatric/Behavioral:  Negative for agitation, confusion, hallucinations, sleep disturbance and suicidal ideas. The patient is not nervous/anxious.        Objective:        08/02/2023    3:58 PM 07/23/2023   10:44 AM 07/23/2023   10:26 AM  Vitals with BMI  Height 5' 6  5' 6  Weight 151 lbs  154 lbs  BMI 24.38  24.87  Systolic 98 102 76  Diastolic 68 68 42  Pulse 78 81 86    No data found.   Physical Exam Vitals and nursing note reviewed.  Constitutional:      Appearance: Normal appearance.     Comments: HEENT: Pupils equal, reactive to light. Nasal discharge present. Oral pharynx without erythema or exudate. Poor dentition NECK: Posterior neck tenderness with possible lymphadenopathy bilaterally. CHEST: Clear to auscultation bilaterally. SKIN: Erythema on breast, likely from scratching. No rash or redness noted on the tender area of the chest wall. Tenderness to palpation noted in the upper area above the left breast.  Musculoskeletal:     Comments: Limb length discrepancy noted. Walks with a cane Gait instability noted  Neurological:     Mental Status: She is alert.     Health Maintenance Due  Topic Date Due    Medicare Annual Wellness (AWV)  Never done    There are no preventive care reminders to display for this patient.   Lab Results  Component Value Date   TSH 30.000 (H) 05/06/2023   Lab Results  Component Value Date   WBC 9.7 07/23/2023   HGB 11.4 07/23/2023   HCT 36.4 07/23/2023   MCV 91 07/23/2023   PLT 315 07/23/2023   Lab Results  Component Value Date   NA 139 07/23/2023   K 4.4 07/23/2023   CO2 26 07/23/2023   GLUCOSE 84 07/23/2023   BUN 30 (H) 07/23/2023   CREATININE 1.75 (H) 07/23/2023   BILITOT 0.3 06/25/2023   ALKPHOS 172 (H) 06/25/2023   AST 22 06/25/2023   ALT 18 06/25/2023   PROT 5.5 (L) 06/25/2023   ALBUMIN 3.2 (L) 06/25/2023   CALCIUM 9.4 07/23/2023   ANIONGAP 10 05/13/2023   EGFR 33 (L) 07/23/2023   Lab Results  Component Value Date   CHOL 121 03/01/2023   Lab Results  Component Value Date   HDL 68 03/01/2023   Lab Results  Component Value Date   LDLCALC 39 03/01/2023   Lab Results  Component Value Date   TRIG 65 03/01/2023   Lab Results  Component Value Date   CHOLHDL 1.8 03/01/2023   No results found for: HGBA1C     Assessment & Plan:  Left-sided chest wall pain Assessment & Plan: Acute  onset of chest wall pain starting Saturday, localized above the left breast, tender to palpation. No signs of rash or redness indicative of shingles. No evidence of cardiac origin as pain is superficial and tender to touch. Differential includes musculoskeletal pain possibly from coughing or other strain. - recommend over-the-counter SALON PAS cream for pain relief - Monitor for any rash or worsening pain - Go to the emergency room if pain becomes severe - Apply heat or ice to the affected area - Stay hydrated and change positions slowly   Subacute cough -     Budesonide -Formoterol  Fumarate; Inhale 2 puffs into the lungs 2 (two) times daily.  Dispense: 10.2 g; Refill: 2  Nasal congestion Assessment & Plan: Nasal congestion has improved.  Initial symptoms likely due to allergies. - Refill Symbicort  inhaler   Dizziness Assessment & Plan: Recent episodes of dizziness and falls, possibly related to cough medicine containing dextromethorphan. Advised to avoid medications with DM or D. Recommended plain Mucinex if needed. - Avoid cough medicines containing dextromethorphan (DM) or decongestants (D) - Use plain Mucinex if needed       Meds ordered this encounter  Medications   budesonide -formoterol  (SYMBICORT ) 160-4.5 MCG/ACT inhaler    Sig: Inhale 2 puffs into the lungs 2 (two) times daily.    Dispense:  10.2 g    Refill:  2    No orders of the defined types were placed in this encounter.    Follow-up: Return if symptoms worsen or fail to improve.  An After Visit Summary was printed and given to the patient.  Kawana Hegel, MD [facility] [phone]

## 2023-08-02 NOTE — Patient Instructions (Addendum)
 Apply heat or ice to the left chest wall where you have soreness Try SALON PAS CREAM over the counter on the left chest wall to give pain relief. Refilled symbicort  Report back if any worsening symptoms or call 911 for any severe symptoms  VISIT SUMMARY:  During today's visit, we discussed your worsening cough, recent dizziness, chest wall pain, and nasal congestion. We reviewed your recent blood work and discussed your general health maintenance.  YOUR PLAN:  -CHEST WALL PAIN: Chest wall pain refers to discomfort in the chest area, often due to muscle strain or injury. You should use over-the-counter [place] cream for pain relief, monitor for any rash or worsening pain, and go to the emergency room if the pain becomes severe. Applying heat or ice to the affected area, staying hydrated, and changing positions slowly can also help.  -DIZZINESS AND FALLS: Dizziness and falls can occur due to various reasons, including side effects from medications. You should avoid cough medicines containing dextromethorphan (DM) or decongestants (D) and use plain Mucinex if needed.  -NASAL CONGESTION: Nasal congestion is often caused by allergies or infections. Your nasal congestion has improved, and you should continue using your Symbicort  inhaler as needed.  -GENERAL HEALTH MAINTENANCE: Your recent blood work shows well-managed kidney function and normal blood counts. You received a flu shot before Christmas. Continue to maintain regular follow-ups for your chronic conditions.  INSTRUCTIONS:  Call 911 or return to the clinic if your chest wall pain worsens or if you experience new symptoms.

## 2023-08-02 NOTE — Telephone Encounter (Signed)
   Copied from CRM [id number]. Topic: Clinical - Pink Word Triage >> Aug 02, 2023 11:43 AM Victorino Dike T wrote: Reason for Triage: possible bronchitis, cough that makes chest hurt up to neck, headache and body aches, fever 102.4, please patient [phone]

## 2023-08-03 DIAGNOSIS — M50221 Other cervical disc displacement at C4-C5 level: Secondary | ICD-10-CM | POA: Diagnosis not present

## 2023-08-03 DIAGNOSIS — R918 Other nonspecific abnormal finding of lung field: Secondary | ICD-10-CM | POA: Diagnosis not present

## 2023-08-03 DIAGNOSIS — R6520 Severe sepsis without septic shock: Secondary | ICD-10-CM | POA: Diagnosis not present

## 2023-08-03 DIAGNOSIS — K7031 Alcoholic cirrhosis of liver with ascites: Secondary | ICD-10-CM | POA: Diagnosis not present

## 2023-08-03 DIAGNOSIS — A4102 Sepsis due to Methicillin resistant Staphylococcus aureus: Secondary | ICD-10-CM | POA: Diagnosis not present

## 2023-08-03 DIAGNOSIS — R5381 Other malaise: Secondary | ICD-10-CM | POA: Diagnosis not present

## 2023-08-03 DIAGNOSIS — I2510 Atherosclerotic heart disease of native coronary artery without angina pectoris: Secondary | ICD-10-CM | POA: Diagnosis not present

## 2023-08-03 DIAGNOSIS — Z79890 Hormone replacement therapy (postmenopausal): Secondary | ICD-10-CM | POA: Diagnosis not present

## 2023-08-03 DIAGNOSIS — E871 Hypo-osmolality and hyponatremia: Secondary | ICD-10-CM | POA: Diagnosis not present

## 2023-08-03 DIAGNOSIS — M4312 Spondylolisthesis, cervical region: Secondary | ICD-10-CM | POA: Diagnosis not present

## 2023-08-03 DIAGNOSIS — N179 Acute kidney failure, unspecified: Secondary | ICD-10-CM | POA: Diagnosis not present

## 2023-08-03 DIAGNOSIS — L97524 Non-pressure chronic ulcer of other part of left foot with necrosis of bone: Secondary | ICD-10-CM | POA: Diagnosis not present

## 2023-08-03 DIAGNOSIS — I080 Rheumatic disorders of both mitral and aortic valves: Secondary | ICD-10-CM | POA: Diagnosis not present

## 2023-08-03 DIAGNOSIS — B965 Pseudomonas (aeruginosa) (mallei) (pseudomallei) as the cause of diseases classified elsewhere: Secondary | ICD-10-CM | POA: Diagnosis not present

## 2023-08-03 DIAGNOSIS — M4802 Spinal stenosis, cervical region: Secondary | ICD-10-CM | POA: Diagnosis not present

## 2023-08-03 DIAGNOSIS — Z452 Encounter for adjustment and management of vascular access device: Secondary | ICD-10-CM | POA: Diagnosis not present

## 2023-08-03 DIAGNOSIS — I4891 Unspecified atrial fibrillation: Secondary | ICD-10-CM | POA: Diagnosis not present

## 2023-08-03 DIAGNOSIS — L03032 Cellulitis of left toe: Secondary | ICD-10-CM | POA: Diagnosis not present

## 2023-08-03 DIAGNOSIS — Z0389 Encounter for observation for other suspected diseases and conditions ruled out: Secondary | ICD-10-CM | POA: Diagnosis not present

## 2023-08-03 DIAGNOSIS — L089 Local infection of the skin and subcutaneous tissue, unspecified: Secondary | ICD-10-CM | POA: Diagnosis not present

## 2023-08-03 DIAGNOSIS — R0981 Nasal congestion: Secondary | ICD-10-CM | POA: Insufficient documentation

## 2023-08-03 DIAGNOSIS — Z8619 Personal history of other infectious and parasitic diseases: Secondary | ICD-10-CM | POA: Diagnosis not present

## 2023-08-03 DIAGNOSIS — A419 Sepsis, unspecified organism: Secondary | ICD-10-CM | POA: Diagnosis not present

## 2023-08-03 DIAGNOSIS — N1832 Chronic kidney disease, stage 3b: Secondary | ICD-10-CM | POA: Diagnosis not present

## 2023-08-03 DIAGNOSIS — R000 Tachycardia, unspecified: Secondary | ICD-10-CM | POA: Diagnosis not present

## 2023-08-03 DIAGNOSIS — R519 Headache, unspecified: Secondary | ICD-10-CM | POA: Diagnosis not present

## 2023-08-03 DIAGNOSIS — R0789 Other chest pain: Secondary | ICD-10-CM | POA: Insufficient documentation

## 2023-08-03 DIAGNOSIS — M868X7 Other osteomyelitis, ankle and foot: Secondary | ICD-10-CM | POA: Diagnosis not present

## 2023-08-03 DIAGNOSIS — L97529 Non-pressure chronic ulcer of other part of left foot with unspecified severity: Secondary | ICD-10-CM | POA: Diagnosis not present

## 2023-08-03 DIAGNOSIS — E0781 Sick-euthyroid syndrome: Secondary | ICD-10-CM | POA: Diagnosis not present

## 2023-08-03 DIAGNOSIS — M869 Osteomyelitis, unspecified: Secondary | ICD-10-CM | POA: Diagnosis not present

## 2023-08-03 DIAGNOSIS — M8610 Other acute osteomyelitis, unspecified site: Secondary | ICD-10-CM | POA: Diagnosis not present

## 2023-08-03 DIAGNOSIS — E11621 Type 2 diabetes mellitus with foot ulcer: Secondary | ICD-10-CM | POA: Diagnosis not present

## 2023-08-03 DIAGNOSIS — F319 Bipolar disorder, unspecified: Secondary | ICD-10-CM | POA: Diagnosis not present

## 2023-08-03 DIAGNOSIS — R509 Fever, unspecified: Secondary | ICD-10-CM | POA: Diagnosis not present

## 2023-08-03 DIAGNOSIS — E861 Hypovolemia: Secondary | ICD-10-CM | POA: Diagnosis not present

## 2023-08-03 DIAGNOSIS — B9562 Methicillin resistant Staphylococcus aureus infection as the cause of diseases classified elsewhere: Secondary | ICD-10-CM | POA: Diagnosis not present

## 2023-08-03 DIAGNOSIS — M19072 Primary osteoarthritis, left ankle and foot: Secondary | ICD-10-CM | POA: Diagnosis not present

## 2023-08-03 DIAGNOSIS — R7881 Bacteremia: Secondary | ICD-10-CM | POA: Diagnosis not present

## 2023-08-03 DIAGNOSIS — R079 Chest pain, unspecified: Secondary | ICD-10-CM | POA: Diagnosis not present

## 2023-08-03 DIAGNOSIS — N390 Urinary tract infection, site not specified: Secondary | ICD-10-CM | POA: Diagnosis not present

## 2023-08-03 DIAGNOSIS — S91109A Unspecified open wound of unspecified toe(s) without damage to nail, initial encounter: Secondary | ICD-10-CM | POA: Diagnosis not present

## 2023-08-03 DIAGNOSIS — R6521 Severe sepsis with septic shock: Secondary | ICD-10-CM | POA: Diagnosis not present

## 2023-08-03 DIAGNOSIS — M86172 Other acute osteomyelitis, left ankle and foot: Secondary | ICD-10-CM | POA: Diagnosis not present

## 2023-08-03 DIAGNOSIS — B9689 Other specified bacterial agents as the cause of diseases classified elsewhere: Secondary | ICD-10-CM | POA: Diagnosis not present

## 2023-08-03 DIAGNOSIS — E1169 Type 2 diabetes mellitus with other specified complication: Secondary | ICD-10-CM | POA: Diagnosis not present

## 2023-08-03 DIAGNOSIS — E1122 Type 2 diabetes mellitus with diabetic chronic kidney disease: Secondary | ICD-10-CM | POA: Diagnosis not present

## 2023-08-03 DIAGNOSIS — M2012 Hallux valgus (acquired), left foot: Secondary | ICD-10-CM | POA: Diagnosis not present

## 2023-08-03 DIAGNOSIS — Z89412 Acquired absence of left great toe: Secondary | ICD-10-CM | POA: Diagnosis not present

## 2023-08-03 DIAGNOSIS — K766 Portal hypertension: Secondary | ICD-10-CM | POA: Diagnosis not present

## 2023-08-03 DIAGNOSIS — Z9889 Other specified postprocedural states: Secondary | ICD-10-CM | POA: Diagnosis not present

## 2023-08-03 DIAGNOSIS — E876 Hypokalemia: Secondary | ICD-10-CM | POA: Diagnosis not present

## 2023-08-03 DIAGNOSIS — E890 Postprocedural hypothyroidism: Secondary | ICD-10-CM | POA: Diagnosis not present

## 2023-08-03 NOTE — Assessment & Plan Note (Signed)
 Acute onset of chest wall pain starting Saturday, localized above the left breast, tender to palpation. No signs of rash or redness indicative of shingles. No evidence of cardiac origin as pain is superficial and tender to touch. Differential includes musculoskeletal pain possibly from coughing or other strain. - recommend over-the-counter SALON PAS cream for pain relief - Monitor for any rash or worsening pain - Go to the emergency room if pain becomes severe - Apply heat or ice to the affected area - Stay hydrated and change positions slowly

## 2023-08-03 NOTE — Assessment & Plan Note (Signed)
 Recent episodes of dizziness and falls, possibly related to cough medicine containing dextromethorphan. Advised to avoid medications with DM or D. Recommended plain Mucinex if needed. - Avoid cough medicines containing dextromethorphan (DM) or decongestants (D) - Use plain Mucinex if needed

## 2023-08-03 NOTE — Assessment & Plan Note (Signed)
 Nasal congestion has improved. Initial symptoms likely due to allergies. - Refill Symbicort inhaler

## 2023-08-04 DIAGNOSIS — M8610 Other acute osteomyelitis, unspecified site: Secondary | ICD-10-CM | POA: Diagnosis not present

## 2023-08-04 DIAGNOSIS — M19072 Primary osteoarthritis, left ankle and foot: Secondary | ICD-10-CM | POA: Diagnosis not present

## 2023-08-04 DIAGNOSIS — A419 Sepsis, unspecified organism: Secondary | ICD-10-CM | POA: Diagnosis not present

## 2023-08-04 DIAGNOSIS — L97529 Non-pressure chronic ulcer of other part of left foot with unspecified severity: Secondary | ICD-10-CM | POA: Diagnosis not present

## 2023-08-04 DIAGNOSIS — M2012 Hallux valgus (acquired), left foot: Secondary | ICD-10-CM | POA: Diagnosis not present

## 2023-08-04 DIAGNOSIS — R000 Tachycardia, unspecified: Secondary | ICD-10-CM | POA: Diagnosis not present

## 2023-08-04 DIAGNOSIS — R6521 Severe sepsis with septic shock: Secondary | ICD-10-CM | POA: Diagnosis not present

## 2023-08-04 DIAGNOSIS — S91109A Unspecified open wound of unspecified toe(s) without damage to nail, initial encounter: Secondary | ICD-10-CM | POA: Diagnosis not present

## 2023-08-04 DIAGNOSIS — R519 Headache, unspecified: Secondary | ICD-10-CM | POA: Diagnosis not present

## 2023-08-05 DIAGNOSIS — Z8619 Personal history of other infectious and parasitic diseases: Secondary | ICD-10-CM | POA: Diagnosis not present

## 2023-08-05 DIAGNOSIS — N390 Urinary tract infection, site not specified: Secondary | ICD-10-CM | POA: Diagnosis not present

## 2023-08-05 DIAGNOSIS — R7881 Bacteremia: Secondary | ICD-10-CM | POA: Diagnosis not present

## 2023-08-05 DIAGNOSIS — L97529 Non-pressure chronic ulcer of other part of left foot with unspecified severity: Secondary | ICD-10-CM | POA: Diagnosis not present

## 2023-08-05 DIAGNOSIS — B9562 Methicillin resistant Staphylococcus aureus infection as the cause of diseases classified elsewhere: Secondary | ICD-10-CM | POA: Diagnosis not present

## 2023-08-05 DIAGNOSIS — R6521 Severe sepsis with septic shock: Secondary | ICD-10-CM | POA: Diagnosis not present

## 2023-08-05 DIAGNOSIS — A419 Sepsis, unspecified organism: Secondary | ICD-10-CM | POA: Diagnosis not present

## 2023-08-05 DIAGNOSIS — R519 Headache, unspecified: Secondary | ICD-10-CM | POA: Diagnosis not present

## 2023-08-05 DIAGNOSIS — M868X7 Other osteomyelitis, ankle and foot: Secondary | ICD-10-CM | POA: Diagnosis not present

## 2023-08-05 DIAGNOSIS — M869 Osteomyelitis, unspecified: Secondary | ICD-10-CM | POA: Diagnosis not present

## 2023-08-06 ENCOUNTER — Ambulatory Visit: Payer: Medicare HMO

## 2023-08-06 DIAGNOSIS — A419 Sepsis, unspecified organism: Secondary | ICD-10-CM | POA: Diagnosis not present

## 2023-08-06 DIAGNOSIS — R6520 Severe sepsis without septic shock: Secondary | ICD-10-CM | POA: Diagnosis not present

## 2023-08-06 DIAGNOSIS — M19072 Primary osteoarthritis, left ankle and foot: Secondary | ICD-10-CM | POA: Diagnosis not present

## 2023-08-06 DIAGNOSIS — R519 Headache, unspecified: Secondary | ICD-10-CM | POA: Diagnosis not present

## 2023-08-06 DIAGNOSIS — L089 Local infection of the skin and subcutaneous tissue, unspecified: Secondary | ICD-10-CM | POA: Diagnosis not present

## 2023-08-06 DIAGNOSIS — Z0389 Encounter for observation for other suspected diseases and conditions ruled out: Secondary | ICD-10-CM | POA: Diagnosis not present

## 2023-08-06 DIAGNOSIS — R7881 Bacteremia: Secondary | ICD-10-CM | POA: Diagnosis not present

## 2023-08-06 DIAGNOSIS — R6521 Severe sepsis with septic shock: Secondary | ICD-10-CM | POA: Diagnosis not present

## 2023-08-06 DIAGNOSIS — B9562 Methicillin resistant Staphylococcus aureus infection as the cause of diseases classified elsewhere: Secondary | ICD-10-CM | POA: Diagnosis not present

## 2023-08-06 DIAGNOSIS — M868X7 Other osteomyelitis, ankle and foot: Secondary | ICD-10-CM | POA: Diagnosis not present

## 2023-08-06 DIAGNOSIS — Z9889 Other specified postprocedural states: Secondary | ICD-10-CM | POA: Diagnosis not present

## 2023-08-06 DIAGNOSIS — M86172 Other acute osteomyelitis, left ankle and foot: Secondary | ICD-10-CM | POA: Diagnosis not present

## 2023-08-06 DIAGNOSIS — L97529 Non-pressure chronic ulcer of other part of left foot with unspecified severity: Secondary | ICD-10-CM | POA: Diagnosis not present

## 2023-08-06 DIAGNOSIS — Z89412 Acquired absence of left great toe: Secondary | ICD-10-CM | POA: Diagnosis not present

## 2023-08-06 DIAGNOSIS — Z8619 Personal history of other infectious and parasitic diseases: Secondary | ICD-10-CM | POA: Diagnosis not present

## 2023-08-06 DIAGNOSIS — N390 Urinary tract infection, site not specified: Secondary | ICD-10-CM | POA: Diagnosis not present

## 2023-08-06 DIAGNOSIS — M869 Osteomyelitis, unspecified: Secondary | ICD-10-CM | POA: Diagnosis not present

## 2023-08-07 DIAGNOSIS — M869 Osteomyelitis, unspecified: Secondary | ICD-10-CM | POA: Diagnosis not present

## 2023-08-07 DIAGNOSIS — R519 Headache, unspecified: Secondary | ICD-10-CM | POA: Diagnosis not present

## 2023-08-07 DIAGNOSIS — R6521 Severe sepsis with septic shock: Secondary | ICD-10-CM | POA: Diagnosis not present

## 2023-08-07 DIAGNOSIS — N390 Urinary tract infection, site not specified: Secondary | ICD-10-CM | POA: Diagnosis not present

## 2023-08-07 DIAGNOSIS — A419 Sepsis, unspecified organism: Secondary | ICD-10-CM | POA: Diagnosis not present

## 2023-08-08 DIAGNOSIS — M869 Osteomyelitis, unspecified: Secondary | ICD-10-CM | POA: Diagnosis not present

## 2023-08-08 DIAGNOSIS — R519 Headache, unspecified: Secondary | ICD-10-CM | POA: Diagnosis not present

## 2023-08-08 DIAGNOSIS — R6521 Severe sepsis with septic shock: Secondary | ICD-10-CM | POA: Diagnosis not present

## 2023-08-08 DIAGNOSIS — A419 Sepsis, unspecified organism: Secondary | ICD-10-CM | POA: Diagnosis not present

## 2023-08-08 DIAGNOSIS — R269 Unspecified abnormalities of gait and mobility: Secondary | ICD-10-CM | POA: Insufficient documentation

## 2023-08-09 DIAGNOSIS — N390 Urinary tract infection, site not specified: Secondary | ICD-10-CM | POA: Diagnosis not present

## 2023-08-09 DIAGNOSIS — R6521 Severe sepsis with septic shock: Secondary | ICD-10-CM | POA: Diagnosis not present

## 2023-08-09 DIAGNOSIS — M869 Osteomyelitis, unspecified: Secondary | ICD-10-CM | POA: Diagnosis not present

## 2023-08-09 DIAGNOSIS — R519 Headache, unspecified: Secondary | ICD-10-CM | POA: Diagnosis not present

## 2023-08-09 DIAGNOSIS — A419 Sepsis, unspecified organism: Secondary | ICD-10-CM | POA: Diagnosis not present

## 2023-08-09 DIAGNOSIS — L97524 Non-pressure chronic ulcer of other part of left foot with necrosis of bone: Secondary | ICD-10-CM | POA: Diagnosis not present

## 2023-08-09 DIAGNOSIS — B9562 Methicillin resistant Staphylococcus aureus infection as the cause of diseases classified elsewhere: Secondary | ICD-10-CM | POA: Diagnosis not present

## 2023-08-09 DIAGNOSIS — R7881 Bacteremia: Secondary | ICD-10-CM | POA: Diagnosis not present

## 2023-08-10 ENCOUNTER — Inpatient Hospital Stay: Payer: Medicare HMO

## 2023-08-10 ENCOUNTER — Inpatient Hospital Stay: Payer: Medicare HMO | Attending: Oncology

## 2023-08-10 ENCOUNTER — Inpatient Hospital Stay: Payer: Medicare HMO | Admitting: Oncology

## 2023-08-10 DIAGNOSIS — R6521 Severe sepsis with septic shock: Secondary | ICD-10-CM | POA: Diagnosis not present

## 2023-08-10 DIAGNOSIS — N390 Urinary tract infection, site not specified: Secondary | ICD-10-CM | POA: Diagnosis not present

## 2023-08-10 DIAGNOSIS — L97524 Non-pressure chronic ulcer of other part of left foot with necrosis of bone: Secondary | ICD-10-CM | POA: Diagnosis not present

## 2023-08-10 DIAGNOSIS — A419 Sepsis, unspecified organism: Secondary | ICD-10-CM | POA: Diagnosis not present

## 2023-08-10 DIAGNOSIS — R519 Headache, unspecified: Secondary | ICD-10-CM | POA: Diagnosis not present

## 2023-08-10 DIAGNOSIS — R7881 Bacteremia: Secondary | ICD-10-CM | POA: Diagnosis not present

## 2023-08-10 DIAGNOSIS — B9562 Methicillin resistant Staphylococcus aureus infection as the cause of diseases classified elsewhere: Secondary | ICD-10-CM | POA: Diagnosis not present

## 2023-08-10 DIAGNOSIS — M869 Osteomyelitis, unspecified: Secondary | ICD-10-CM | POA: Diagnosis not present

## 2023-08-10 NOTE — Progress Notes (Deleted)
[facility]  [address] [place],  [place]  [address] [phone]  Clinic Day:  08/10/2023  Referring physician: Blane Ohara, MD   HISTORY OF PRESENT ILLNESS:  The patient is a 58 y.o. female with a history of both leukocytosis and anemia.  A recent bone marrow biopsy did not show any obvious marrow etiology behind her abnormal counts.  She has been receiving monthly Retacrit shots to get her hemoglobin to/above 10.  She comes in today to reassess her anemia.  Overall, the patient claims to be doing okay.  She does have occasional fatigue, but denies having any overt forms of blood loss.      PHYSICAL EXAM:  There were no vitals taken for this visit. Wt Readings from Last 3 Encounters:  08/02/23 151 lb (68.5 kg)  07/23/23 154 lb (69.9 kg)  06/25/23 167 lb (75.8 kg)   There is no height or weight on file to calculate BMI. Performance status (ECOG): {CHL ONC [id number] Physical Exam  LABS:      Latest Ref Rng & Units 07/23/2023   10:58 AM 07/12/2023   10:53 AM 06/25/2023   10:29 AM  CBC  WBC 3.4 - 10.8 x10E3/uL 9.7  10.6  8.6   Hemoglobin 11.1 - 15.9 g/dL 46.9  62.9  9.8   Hematocrit 34.0 - 46.6 % 36.4  32.0  32.7   Platelets 150 - 450 x10E3/uL 315  358  329       Latest Ref Rng & Units 07/23/2023    9:58 AM 06/25/2023   10:29 AM 05/13/2023   10:06 AM  CMP  Glucose 70 - 99 mg/dL 84  60  528   BUN 6 - 24 mg/dL 30  30  30    Creatinine 0.57 - 1.00 mg/dL 4.13  2.44  0.10   Sodium 134 - 144 mmol/L 139  139  134   Potassium 3.5 - 5.2 mmol/L 4.4  4.5  3.5   Chloride 96 - 106 mmol/L 98  97  97   CO2 20 - 29 mmol/L 26  30  27    Calcium 8.7 - 10.2 mg/dL 9.4  9.2  7.5   Total Protein 6.0 - 8.5 g/dL  5.5  6.3   Total Bilirubin 0.0 - 1.2 mg/dL  0.3  0.7   Alkaline Phos 44 - 121 IU/L  172  120   AST 0 - 40 IU/L  22  31   ALT 0 - 32 IU/L  18  29      No results found for: "CEA1", "CEA" / No results found for: "CEA1", "CEA" No results  found for: "PSA1" No results found for: "UVO536" No results found for: "CAN125"  No results found for: "TOTALPROTELP", "ALBUMINELP", "A1GS", "A2GS", "BETS", "BETA2SER", "GAMS", "MSPIKE", "SPEI" Lab Results  Component Value Date   TIBC 287 05/13/2023   TIBC 241 (L) 04/26/2023   TIBC 211 (L) 02/02/2023   FERRITIN 263 05/13/2023   FERRITIN 391 (H) 04/26/2023   FERRITIN 381 (H) 02/02/2023   IRONPCTSAT 21 05/13/2023   IRONPCTSAT 14 (L) 04/26/2023   IRONPCTSAT 15 02/02/2023   No results found for: "LDH"  No results found for: "AFPTUMOR", "TOTALPROTELP", "ALBUMINELP", "A1GS", "A2GS", "BETS", "BETA2SER", "GAMS", "MSPIKE", "SPEI", "LDH", "CEA1", "CEA", "PSA1", "IGASERUM", "IGGSERUM", "IGMSERUM", "THGAB", "THYROGLB"  Review Flowsheet  More data exists      Latest Ref Rng & Units 02/02/2023 04/26/2023 05/13/2023  Oncology Labs  Ferritin 11 - 307 ng/mL 381  391  263   %SAT 10.4 - 31.8 % 15  14  21       STUDIES:  No results found.    ASSESSMENT & PLAN:   Assessment/Plan:  A 58 y.o. female with *** .The patient understands all the plans discussed today and is in agreement with them.      Macklyn Glandon Kirby Funk, MD

## 2023-08-11 DIAGNOSIS — L97524 Non-pressure chronic ulcer of other part of left foot with necrosis of bone: Secondary | ICD-10-CM | POA: Diagnosis not present

## 2023-08-11 DIAGNOSIS — R519 Headache, unspecified: Secondary | ICD-10-CM | POA: Diagnosis not present

## 2023-08-11 DIAGNOSIS — A419 Sepsis, unspecified organism: Secondary | ICD-10-CM | POA: Diagnosis not present

## 2023-08-11 DIAGNOSIS — B9562 Methicillin resistant Staphylococcus aureus infection as the cause of diseases classified elsewhere: Secondary | ICD-10-CM | POA: Diagnosis not present

## 2023-08-11 DIAGNOSIS — R6521 Severe sepsis with septic shock: Secondary | ICD-10-CM | POA: Diagnosis not present

## 2023-08-11 DIAGNOSIS — M869 Osteomyelitis, unspecified: Secondary | ICD-10-CM | POA: Diagnosis not present

## 2023-08-11 DIAGNOSIS — N390 Urinary tract infection, site not specified: Secondary | ICD-10-CM | POA: Diagnosis not present

## 2023-08-11 DIAGNOSIS — R7881 Bacteremia: Secondary | ICD-10-CM | POA: Diagnosis not present

## 2023-08-12 DIAGNOSIS — R7881 Bacteremia: Secondary | ICD-10-CM | POA: Diagnosis not present

## 2023-08-12 DIAGNOSIS — L97524 Non-pressure chronic ulcer of other part of left foot with necrosis of bone: Secondary | ICD-10-CM | POA: Diagnosis not present

## 2023-08-12 DIAGNOSIS — B9562 Methicillin resistant Staphylococcus aureus infection as the cause of diseases classified elsewhere: Secondary | ICD-10-CM | POA: Diagnosis not present

## 2023-08-12 DIAGNOSIS — A419 Sepsis, unspecified organism: Secondary | ICD-10-CM | POA: Diagnosis not present

## 2023-08-12 DIAGNOSIS — R6521 Severe sepsis with septic shock: Secondary | ICD-10-CM | POA: Diagnosis not present

## 2023-08-12 DIAGNOSIS — R519 Headache, unspecified: Secondary | ICD-10-CM | POA: Diagnosis not present

## 2023-08-12 DIAGNOSIS — M869 Osteomyelitis, unspecified: Secondary | ICD-10-CM | POA: Diagnosis not present

## 2023-08-12 DIAGNOSIS — N390 Urinary tract infection, site not specified: Secondary | ICD-10-CM | POA: Diagnosis not present

## 2023-08-13 DIAGNOSIS — A419 Sepsis, unspecified organism: Secondary | ICD-10-CM | POA: Diagnosis not present

## 2023-08-13 DIAGNOSIS — R6521 Severe sepsis with septic shock: Secondary | ICD-10-CM | POA: Diagnosis not present

## 2023-08-13 DIAGNOSIS — L97524 Non-pressure chronic ulcer of other part of left foot with necrosis of bone: Secondary | ICD-10-CM | POA: Diagnosis not present

## 2023-08-13 DIAGNOSIS — R519 Headache, unspecified: Secondary | ICD-10-CM | POA: Diagnosis not present

## 2023-08-13 DIAGNOSIS — N390 Urinary tract infection, site not specified: Secondary | ICD-10-CM | POA: Diagnosis not present

## 2023-08-13 DIAGNOSIS — M869 Osteomyelitis, unspecified: Secondary | ICD-10-CM | POA: Diagnosis not present

## 2023-08-13 DIAGNOSIS — B9562 Methicillin resistant Staphylococcus aureus infection as the cause of diseases classified elsewhere: Secondary | ICD-10-CM | POA: Diagnosis not present

## 2023-08-13 DIAGNOSIS — R7881 Bacteremia: Secondary | ICD-10-CM | POA: Diagnosis not present

## 2023-08-14 DIAGNOSIS — M869 Osteomyelitis, unspecified: Secondary | ICD-10-CM | POA: Diagnosis not present

## 2023-08-14 DIAGNOSIS — R519 Headache, unspecified: Secondary | ICD-10-CM | POA: Diagnosis not present

## 2023-08-14 DIAGNOSIS — A419 Sepsis, unspecified organism: Secondary | ICD-10-CM | POA: Diagnosis not present

## 2023-08-14 DIAGNOSIS — R6521 Severe sepsis with septic shock: Secondary | ICD-10-CM | POA: Diagnosis not present

## 2023-08-15 DIAGNOSIS — E1169 Type 2 diabetes mellitus with other specified complication: Secondary | ICD-10-CM | POA: Diagnosis not present

## 2023-08-15 DIAGNOSIS — E871 Hypo-osmolality and hyponatremia: Secondary | ICD-10-CM | POA: Diagnosis not present

## 2023-08-15 DIAGNOSIS — D72829 Elevated white blood cell count, unspecified: Secondary | ICD-10-CM | POA: Diagnosis not present

## 2023-08-15 DIAGNOSIS — E1122 Type 2 diabetes mellitus with diabetic chronic kidney disease: Secondary | ICD-10-CM | POA: Diagnosis not present

## 2023-08-15 DIAGNOSIS — D631 Anemia in chronic kidney disease: Secondary | ICD-10-CM | POA: Diagnosis not present

## 2023-08-15 DIAGNOSIS — R911 Solitary pulmonary nodule: Secondary | ICD-10-CM | POA: Diagnosis not present

## 2023-08-15 DIAGNOSIS — N1832 Chronic kidney disease, stage 3b: Secondary | ICD-10-CM | POA: Diagnosis not present

## 2023-08-15 DIAGNOSIS — L03116 Cellulitis of left lower limb: Secondary | ICD-10-CM | POA: Diagnosis not present

## 2023-08-15 DIAGNOSIS — N390 Urinary tract infection, site not specified: Secondary | ICD-10-CM | POA: Diagnosis not present

## 2023-08-15 DIAGNOSIS — K7030 Alcoholic cirrhosis of liver without ascites: Secondary | ICD-10-CM | POA: Diagnosis not present

## 2023-08-15 DIAGNOSIS — E876 Hypokalemia: Secondary | ICD-10-CM | POA: Diagnosis not present

## 2023-08-15 DIAGNOSIS — G43909 Migraine, unspecified, not intractable, without status migrainosus: Secondary | ICD-10-CM | POA: Diagnosis not present

## 2023-08-15 DIAGNOSIS — M86172 Other acute osteomyelitis, left ankle and foot: Secondary | ICD-10-CM | POA: Diagnosis not present

## 2023-08-15 DIAGNOSIS — I959 Hypotension, unspecified: Secondary | ICD-10-CM | POA: Diagnosis not present

## 2023-08-15 DIAGNOSIS — N179 Acute kidney failure, unspecified: Secondary | ICD-10-CM | POA: Diagnosis not present

## 2023-08-15 DIAGNOSIS — R269 Unspecified abnormalities of gait and mobility: Secondary | ICD-10-CM | POA: Diagnosis not present

## 2023-08-15 DIAGNOSIS — Z4781 Encounter for orthopedic aftercare following surgical amputation: Secondary | ICD-10-CM | POA: Diagnosis not present

## 2023-08-15 DIAGNOSIS — K766 Portal hypertension: Secondary | ICD-10-CM | POA: Diagnosis not present

## 2023-08-15 DIAGNOSIS — E892 Postprocedural hypoparathyroidism: Secondary | ICD-10-CM | POA: Diagnosis not present

## 2023-08-15 DIAGNOSIS — B965 Pseudomonas (aeruginosa) (mallei) (pseudomallei) as the cause of diseases classified elsewhere: Secondary | ICD-10-CM | POA: Diagnosis not present

## 2023-08-15 DIAGNOSIS — F1021 Alcohol dependence, in remission: Secondary | ICD-10-CM | POA: Diagnosis not present

## 2023-08-15 DIAGNOSIS — U071 COVID-19: Secondary | ICD-10-CM | POA: Diagnosis not present

## 2023-08-15 DIAGNOSIS — E11649 Type 2 diabetes mellitus with hypoglycemia without coma: Secondary | ICD-10-CM | POA: Diagnosis not present

## 2023-08-15 DIAGNOSIS — A4102 Sepsis due to Methicillin resistant Staphylococcus aureus: Secondary | ICD-10-CM | POA: Diagnosis not present

## 2023-08-16 ENCOUNTER — Other Ambulatory Visit: Payer: Self-pay | Admitting: Family Medicine

## 2023-08-16 DIAGNOSIS — N390 Urinary tract infection, site not specified: Secondary | ICD-10-CM | POA: Diagnosis not present

## 2023-08-16 DIAGNOSIS — E1169 Type 2 diabetes mellitus with other specified complication: Secondary | ICD-10-CM | POA: Diagnosis not present

## 2023-08-16 DIAGNOSIS — D72829 Elevated white blood cell count, unspecified: Secondary | ICD-10-CM | POA: Diagnosis not present

## 2023-08-16 DIAGNOSIS — E876 Hypokalemia: Secondary | ICD-10-CM | POA: Diagnosis not present

## 2023-08-16 DIAGNOSIS — L03116 Cellulitis of left lower limb: Secondary | ICD-10-CM | POA: Diagnosis not present

## 2023-08-16 DIAGNOSIS — A4102 Sepsis due to Methicillin resistant Staphylococcus aureus: Secondary | ICD-10-CM | POA: Diagnosis not present

## 2023-08-16 DIAGNOSIS — N179 Acute kidney failure, unspecified: Secondary | ICD-10-CM | POA: Diagnosis not present

## 2023-08-16 DIAGNOSIS — G43909 Migraine, unspecified, not intractable, without status migrainosus: Secondary | ICD-10-CM | POA: Diagnosis not present

## 2023-08-16 DIAGNOSIS — E11649 Type 2 diabetes mellitus with hypoglycemia without coma: Secondary | ICD-10-CM | POA: Diagnosis not present

## 2023-08-16 DIAGNOSIS — K766 Portal hypertension: Secondary | ICD-10-CM | POA: Diagnosis not present

## 2023-08-16 DIAGNOSIS — R911 Solitary pulmonary nodule: Secondary | ICD-10-CM | POA: Diagnosis not present

## 2023-08-16 DIAGNOSIS — F1021 Alcohol dependence, in remission: Secondary | ICD-10-CM | POA: Diagnosis not present

## 2023-08-16 DIAGNOSIS — K7030 Alcoholic cirrhosis of liver without ascites: Secondary | ICD-10-CM | POA: Diagnosis not present

## 2023-08-16 DIAGNOSIS — E871 Hypo-osmolality and hyponatremia: Secondary | ICD-10-CM | POA: Diagnosis not present

## 2023-08-16 DIAGNOSIS — M86172 Other acute osteomyelitis, left ankle and foot: Secondary | ICD-10-CM | POA: Diagnosis not present

## 2023-08-16 DIAGNOSIS — R269 Unspecified abnormalities of gait and mobility: Secondary | ICD-10-CM | POA: Diagnosis not present

## 2023-08-16 DIAGNOSIS — N1832 Chronic kidney disease, stage 3b: Secondary | ICD-10-CM | POA: Diagnosis not present

## 2023-08-16 DIAGNOSIS — M869 Osteomyelitis, unspecified: Secondary | ICD-10-CM | POA: Diagnosis not present

## 2023-08-16 DIAGNOSIS — Z4781 Encounter for orthopedic aftercare following surgical amputation: Secondary | ICD-10-CM | POA: Diagnosis not present

## 2023-08-16 DIAGNOSIS — B965 Pseudomonas (aeruginosa) (mallei) (pseudomallei) as the cause of diseases classified elsewhere: Secondary | ICD-10-CM | POA: Diagnosis not present

## 2023-08-16 DIAGNOSIS — U071 COVID-19: Secondary | ICD-10-CM | POA: Diagnosis not present

## 2023-08-16 DIAGNOSIS — E892 Postprocedural hypoparathyroidism: Secondary | ICD-10-CM | POA: Diagnosis not present

## 2023-08-16 DIAGNOSIS — D631 Anemia in chronic kidney disease: Secondary | ICD-10-CM | POA: Diagnosis not present

## 2023-08-16 DIAGNOSIS — E1122 Type 2 diabetes mellitus with diabetic chronic kidney disease: Secondary | ICD-10-CM | POA: Diagnosis not present

## 2023-08-16 DIAGNOSIS — I959 Hypotension, unspecified: Secondary | ICD-10-CM | POA: Diagnosis not present

## 2023-08-17 DIAGNOSIS — E860 Dehydration: Secondary | ICD-10-CM | POA: Diagnosis not present

## 2023-08-17 DIAGNOSIS — M1990 Unspecified osteoarthritis, unspecified site: Secondary | ICD-10-CM | POA: Diagnosis not present

## 2023-08-17 DIAGNOSIS — R419 Unspecified symptoms and signs involving cognitive functions and awareness: Secondary | ICD-10-CM | POA: Diagnosis not present

## 2023-08-17 DIAGNOSIS — F319 Bipolar disorder, unspecified: Secondary | ICD-10-CM | POA: Diagnosis not present

## 2023-08-17 DIAGNOSIS — M6282 Rhabdomyolysis: Secondary | ICD-10-CM | POA: Diagnosis not present

## 2023-08-17 DIAGNOSIS — R7401 Elevation of levels of liver transaminase levels: Secondary | ICD-10-CM | POA: Diagnosis not present

## 2023-08-17 DIAGNOSIS — D500 Iron deficiency anemia secondary to blood loss (chronic): Secondary | ICD-10-CM | POA: Diagnosis not present

## 2023-08-17 DIAGNOSIS — R918 Other nonspecific abnormal finding of lung field: Secondary | ICD-10-CM | POA: Diagnosis not present

## 2023-08-17 DIAGNOSIS — B9562 Methicillin resistant Staphylococcus aureus infection as the cause of diseases classified elsewhere: Secondary | ICD-10-CM | POA: Diagnosis not present

## 2023-08-17 DIAGNOSIS — E876 Hypokalemia: Secondary | ICD-10-CM | POA: Diagnosis not present

## 2023-08-17 DIAGNOSIS — Z87440 Personal history of urinary (tract) infections: Secondary | ICD-10-CM | POA: Diagnosis not present

## 2023-08-17 DIAGNOSIS — F419 Anxiety disorder, unspecified: Secondary | ICD-10-CM | POA: Diagnosis not present

## 2023-08-17 DIAGNOSIS — N1832 Chronic kidney disease, stage 3b: Secondary | ICD-10-CM | POA: Diagnosis not present

## 2023-08-17 DIAGNOSIS — R7881 Bacteremia: Secondary | ICD-10-CM | POA: Diagnosis not present

## 2023-08-17 DIAGNOSIS — J449 Chronic obstructive pulmonary disease, unspecified: Secondary | ICD-10-CM | POA: Diagnosis not present

## 2023-08-17 DIAGNOSIS — S20219A Contusion of unspecified front wall of thorax, initial encounter: Secondary | ICD-10-CM | POA: Diagnosis not present

## 2023-08-17 DIAGNOSIS — E871 Hypo-osmolality and hyponatremia: Secondary | ICD-10-CM | POA: Diagnosis not present

## 2023-08-17 DIAGNOSIS — S20212A Contusion of left front wall of thorax, initial encounter: Secondary | ICD-10-CM | POA: Diagnosis not present

## 2023-08-17 DIAGNOSIS — K219 Gastro-esophageal reflux disease without esophagitis: Secondary | ICD-10-CM | POA: Diagnosis not present

## 2023-08-17 DIAGNOSIS — R0789 Other chest pain: Secondary | ICD-10-CM | POA: Diagnosis not present

## 2023-08-17 DIAGNOSIS — D62 Acute posthemorrhagic anemia: Secondary | ICD-10-CM | POA: Diagnosis not present

## 2023-08-17 DIAGNOSIS — D509 Iron deficiency anemia, unspecified: Secondary | ICD-10-CM | POA: Diagnosis not present

## 2023-08-17 DIAGNOSIS — W19XXXA Unspecified fall, initial encounter: Secondary | ICD-10-CM | POA: Diagnosis not present

## 2023-08-17 DIAGNOSIS — Z89412 Acquired absence of left great toe: Secondary | ICD-10-CM | POA: Diagnosis not present

## 2023-08-17 DIAGNOSIS — J189 Pneumonia, unspecified organism: Secondary | ICD-10-CM | POA: Diagnosis not present

## 2023-08-17 DIAGNOSIS — E892 Postprocedural hypoparathyroidism: Secondary | ICD-10-CM | POA: Diagnosis not present

## 2023-08-17 DIAGNOSIS — E890 Postprocedural hypothyroidism: Secondary | ICD-10-CM | POA: Diagnosis not present

## 2023-08-17 DIAGNOSIS — K5900 Constipation, unspecified: Secondary | ICD-10-CM | POA: Diagnosis not present

## 2023-08-17 DIAGNOSIS — R7989 Other specified abnormal findings of blood chemistry: Secondary | ICD-10-CM | POA: Diagnosis not present

## 2023-08-17 DIAGNOSIS — Z8673 Personal history of transient ischemic attack (TIA), and cerebral infarction without residual deficits: Secondary | ICD-10-CM | POA: Diagnosis not present

## 2023-08-17 DIAGNOSIS — N10 Acute tubulo-interstitial nephritis: Secondary | ICD-10-CM | POA: Diagnosis not present

## 2023-08-17 DIAGNOSIS — Z85850 Personal history of malignant neoplasm of thyroid: Secondary | ICD-10-CM | POA: Diagnosis not present

## 2023-08-17 DIAGNOSIS — E8809 Other disorders of plasma-protein metabolism, not elsewhere classified: Secondary | ICD-10-CM | POA: Diagnosis not present

## 2023-08-18 ENCOUNTER — Other Ambulatory Visit: Payer: Self-pay | Admitting: Family Medicine

## 2023-08-18 DIAGNOSIS — S20212A Contusion of left front wall of thorax, initial encounter: Secondary | ICD-10-CM | POA: Diagnosis not present

## 2023-08-18 DIAGNOSIS — J449 Chronic obstructive pulmonary disease, unspecified: Secondary | ICD-10-CM | POA: Diagnosis not present

## 2023-08-19 MED ORDER — FUROSEMIDE 40 MG PO TABS: 80.0000 mg | ORAL_TABLET | Freq: Two times a day (BID) | ORAL | 0 refills | Status: DC

## 2023-08-21 DIAGNOSIS — M869 Osteomyelitis, unspecified: Secondary | ICD-10-CM | POA: Diagnosis not present

## 2023-08-23 ENCOUNTER — Telehealth: Payer: Self-pay

## 2023-08-23 ENCOUNTER — Telehealth: Payer: Self-pay | Admitting: Family Medicine

## 2023-08-23 DIAGNOSIS — Z4781 Encounter for orthopedic aftercare following surgical amputation: Secondary | ICD-10-CM | POA: Diagnosis not present

## 2023-08-23 DIAGNOSIS — D72829 Elevated white blood cell count, unspecified: Secondary | ICD-10-CM | POA: Diagnosis not present

## 2023-08-23 DIAGNOSIS — R911 Solitary pulmonary nodule: Secondary | ICD-10-CM | POA: Diagnosis not present

## 2023-08-23 DIAGNOSIS — F1021 Alcohol dependence, in remission: Secondary | ICD-10-CM | POA: Diagnosis not present

## 2023-08-23 DIAGNOSIS — E876 Hypokalemia: Secondary | ICD-10-CM | POA: Diagnosis not present

## 2023-08-23 DIAGNOSIS — K7030 Alcoholic cirrhosis of liver without ascites: Secondary | ICD-10-CM | POA: Diagnosis not present

## 2023-08-23 DIAGNOSIS — E11649 Type 2 diabetes mellitus with hypoglycemia without coma: Secondary | ICD-10-CM | POA: Diagnosis not present

## 2023-08-23 DIAGNOSIS — S98112A Complete traumatic amputation of left great toe, initial encounter: Secondary | ICD-10-CM | POA: Diagnosis not present

## 2023-08-23 DIAGNOSIS — I959 Hypotension, unspecified: Secondary | ICD-10-CM | POA: Diagnosis not present

## 2023-08-23 DIAGNOSIS — U071 COVID-19: Secondary | ICD-10-CM | POA: Diagnosis not present

## 2023-08-23 DIAGNOSIS — E871 Hypo-osmolality and hyponatremia: Secondary | ICD-10-CM | POA: Diagnosis not present

## 2023-08-23 DIAGNOSIS — E1122 Type 2 diabetes mellitus with diabetic chronic kidney disease: Secondary | ICD-10-CM | POA: Diagnosis not present

## 2023-08-23 DIAGNOSIS — N1832 Chronic kidney disease, stage 3b: Secondary | ICD-10-CM | POA: Diagnosis not present

## 2023-08-23 DIAGNOSIS — B965 Pseudomonas (aeruginosa) (mallei) (pseudomallei) as the cause of diseases classified elsewhere: Secondary | ICD-10-CM | POA: Diagnosis not present

## 2023-08-23 DIAGNOSIS — G43909 Migraine, unspecified, not intractable, without status migrainosus: Secondary | ICD-10-CM | POA: Diagnosis not present

## 2023-08-23 DIAGNOSIS — L03116 Cellulitis of left lower limb: Secondary | ICD-10-CM | POA: Diagnosis not present

## 2023-08-23 DIAGNOSIS — M86172 Other acute osteomyelitis, left ankle and foot: Secondary | ICD-10-CM | POA: Diagnosis not present

## 2023-08-23 DIAGNOSIS — R269 Unspecified abnormalities of gait and mobility: Secondary | ICD-10-CM | POA: Diagnosis not present

## 2023-08-23 DIAGNOSIS — M869 Osteomyelitis, unspecified: Secondary | ICD-10-CM | POA: Diagnosis not present

## 2023-08-23 DIAGNOSIS — K766 Portal hypertension: Secondary | ICD-10-CM | POA: Diagnosis not present

## 2023-08-23 DIAGNOSIS — D631 Anemia in chronic kidney disease: Secondary | ICD-10-CM | POA: Diagnosis not present

## 2023-08-23 DIAGNOSIS — N390 Urinary tract infection, site not specified: Secondary | ICD-10-CM | POA: Diagnosis not present

## 2023-08-23 DIAGNOSIS — A4102 Sepsis due to Methicillin resistant Staphylococcus aureus: Secondary | ICD-10-CM | POA: Diagnosis not present

## 2023-08-23 DIAGNOSIS — E892 Postprocedural hypoparathyroidism: Secondary | ICD-10-CM | POA: Diagnosis not present

## 2023-08-23 DIAGNOSIS — E1169 Type 2 diabetes mellitus with other specified complication: Secondary | ICD-10-CM | POA: Diagnosis not present

## 2023-08-23 DIAGNOSIS — N179 Acute kidney failure, unspecified: Secondary | ICD-10-CM | POA: Diagnosis not present

## 2023-08-23 NOTE — Telephone Encounter (Signed)
[facility]-PLAN OF CARE-ORDER # [id number]

## 2023-08-23 NOTE — Telephone Encounter (Signed)
Order #: [id number]

## 2023-08-24 ENCOUNTER — Telehealth: Payer: Self-pay

## 2023-08-24 NOTE — Transitions of Care (Post Inpatient/ED Visit) (Signed)
   08/24/2023  Name: Molly Parrish MRN: [id number] DOB: 1966/02/01  Today's TOC FU Call Status: Today's TOC FU Call Status:: Unsuccessful Call (1st Attempt) Unsuccessful Call (1st Attempt) Date: 08/23/23  Attempted to reach the patient regarding the most recent Inpatient/ED visit.  Follow Up Plan: Additional outreach attempts will be made to reach the patient to complete the Transitions of Care (Post Inpatient/ED visit) call.   Signature Luke Sharps

## 2023-08-24 NOTE — Transitions of Care (Post Inpatient/ED Visit) (Signed)
 08/24/2023  Name: Molly Parrish MRN: [id number] DOB: September 03, 1965  Today's TOC FU Call Status: Today's TOC FU Call Status:: Successful TOC FU Call Completed Unsuccessful Call (1st Attempt) Date: 08/23/23 [facility] FU Call Complete Date: 08/24/23 Patient's Name and Date of Birth confirmed.  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  Large hematoma along the anterior chest wall -Evacuation of left chest wall hematoma with drain placement done by Dr Joesph on 08/18/23 - patient has follow-up appt with him this Friday -Patient transfused total of two units PRBC during hospitalization with discharge HGB of 9.3 -Patient discharged with home health PT/OT and RN -Discharged with Azithromycin  500 mg PO every day x 3 days for pneumonia  Items Reviewed: Did you receive and understand the discharge instructions provided?: Yes Medications obtained,verified, and reconciled?: Yes (Medications Reviewed) Any new allergies since your discharge?: No Dietary orders reviewed?: NA Do you have support at home?: Yes  Medications Reviewed Today: Medications Reviewed Today     Reviewed by Claudene Suzen HERO, LPN (Licensed Practical Nurse)  on 08/24/23 at 1003  Med List Status: <None>   Medication Order Taking? Sig Documenting Provider Last Dose Status Informant  acetaminophen  (TYLENOL ) 325 MG tablet [id number] Yes Take by mouth. [provider] Taking Active   ALPRAZolam  (XANAX ) 0.25 MG tablet [id number] Yes Take 1 tablet by mouth twice daily as needed for anxiety Cox, Kirsten, MD Taking Active   azithromycin  (ZITHROMAX ) 500 MG tablet [id number] Yes Take 500 mg by mouth daily. [provider] Taking Active   budesonide -formoterol  (SYMBICORT ) 160-4.5 MCG/ACT inhaler [id number] Yes Inhale 2 puffs into the lungs 2 (two) times daily. Sirivol, Mamatha, MD Taking Active   calcitRIOL (ROCALTROL) 0.5 MCG capsule [id number] Yes Take 0.5 mcg by mouth 2 (two) times daily. [provider] Taking Active   calcium carbonate (OSCAL) 1500 (600 Ca) MG TABS tablet [id number] Yes Take 3,600 mg by mouth 2 (two) times daily with a meal. [provider] Taking Active   CAPVAXIVE 0.5 ML injection [id number] Yes  [provider] Taking Active   ciprofloxacin  (CIPRO ) 500 MG tablet [id number] Yes Take 500 mg by mouth 2 (two) times daily. [provider] Taking Active   CONSTULOSE 10 GM/15ML solution [id number] Yes Take 20 g by mouth 2 (two) times daily. [provider] Taking Active   Cyanocobalamin  (VITAMIN B 12 PO) [id number] Yes Take 1,000 mcg by mouth daily. [provider] Taking Active   cyclobenzaprine  (FLEXERIL ) 10 MG tablet [id number] Yes Take 1 tablet by mouth three times daily as needed for muscle spasm Cox, Kirsten, MD Taking Active   DAPTOmycin (CUBICIN) 500 MG injection [id number] Yes Inject into the vein. [provider] Taking Active   famotidine  (PEPCID ) 40 MG tablet [id number] Yes Take 40 mg by mouth at bedtime. [provider] Taking  Active   furosemide  (LASIX ) 40 MG tablet [id number] Yes Take 2 tablets (80 mg total) by mouth 2 (two) times daily. Cox, Kirsten, MD  Taking Active   gabapentin  (NEURONTIN ) 100 MG capsule [id number] Yes TAKE 1 CAPSULE(100 MG) BY MOUTH THREE TIMES DAILY Sirivol, Mamatha, MD Taking Active   Glucagon  (GVOKE HYPOPEN  2-PACK) 0.5 MG/0.1ML SOAJ [id number] Yes If glucose less than 60, use gvoke pen x 1. May repeat if not responding x 1 daily. Call EMS if not improving. Cox, Kirsten, MD Taking Active   hydrOXYzine  (VISTARIL ) 25 MG capsule [id number] Yes TAKE 1 CAPSULE BY MOUTH THREE TIMES DAILY Cox, Kirsten, MD Taking Active   Iron -Vitamins (GERITOL COMPLETE) TABS [id number] Yes Take 1 tablet by mouth daily. [provider] Taking Active   lamoTRIgine (LAMICTAL) 200 MG tablet [id number] Yes Take 200 mg by mouth at bedtime. [provider] Taking Active   levothyroxine  (SYNTHROID ) 150 MCG tablet [id number] Yes Take 1 tablet (150 mcg total) by mouth daily before breakfast. Cox, Kirsten, MD Taking Active   magnesium oxide (MAG-OX) 400 MG tablet [id number] Yes Take 400 mg by mouth daily. [provider] Taking Active   midodrine  (PROAMATINE ) 2.5 MG tablet [id number] Yes Take 1 tablet (2.5 mg total) by mouth 3 (three) times daily with meals. Cox, Kirsten, MD Taking Active   omeprazole (PRILOSEC) 40 MG capsule [id number] Yes Take 40 mg by mouth daily. [provider] Taking Active   ondansetron  (ZOFRAN ) 4 MG tablet [id number] Yes TAKE 1 TABLET(4 MG) BY MOUTH EVERY 8 HOURS AS NEEDED FOR NAUSEA OR VOMITING Cox, Kirsten, MD Taking Active   pantoprazole  (PROTONIX ) 40 MG tablet [id number] Yes TAKE 1 TABLET(40 MG) BY MOUTH DAILY Cox, Kirsten, MD Taking Active   phentermine (ADIPEX-P) 37.5 MG tablet [id number] Yes Take 37.5 mg by mouth every morning. [provider] Taking Active   promethazine (PHENERGAN) 25 MG tablet [id number] Yes TAKE 1 TABLET BY MOUTH FOUR TIMES DAILY AS NEEDED FOR NAUSEA OR VOMITING Cox, Kirsten, MD Taking Active   propranolol (INDERAL) 10 MG tablet [id number] Yes Take 10 mg by mouth at bedtime.  [provider] Taking Active   QUEtiapine (SEROQUEL) 50 MG tablet [id number] Yes Take 100 mg by mouth at bedtime. [provider] Taking Active   rifaximin (XIFAXAN) 550 MG TABS tablet [id number] Yes Take 550 mg by mouth 2 (two) times daily. [provider] Taking Active   sertraline (ZOLOFT) 100 MG tablet [id number] Yes Take 100 mg by mouth in the morning and at bedtime. [provider] Taking Active   sevelamer carbonate (RENVELA) 800 MG tablet [id number] Yes Take 800 mg by mouth 3 (three) times daily with meals. [provider] Taking Active   spironolactone  (ALDACTONE ) 50 MG tablet [id number] Yes Take 2 tablets (100 mg total) by mouth 2 (two) times daily. Sherre Clapper, MD Taking Active   thiamine 100 MG tablet [id number] Yes Take 100 mg by mouth daily. [provider] Taking Active   topiramate (TOPAMAX) 100 MG tablet [id number] Yes Take 100 mg by mouth at bedtime. [provider] Taking Active   traMADol  (ULTRAM ) 50 MG tablet [id number] Yes TAKE 2 TABLETS BY MOUTH TWICE DAILY Cox, Kirsten, MD Taking Active   triamcinolone  (KENALOG ) 0.1 % paste [id number] Yes USE AS DIRECTED IN  MOUTH  OR  THROAT  TWICE  DAILY Cox, Kirsten, MD Taking Active   Vitamin D , Ergocalciferol , (DRISDOL ) 1.25 MG (50000 UNIT) CAPS capsule [id number] Yes Take 1 capsule (50,000 Units total) by mouth  2 (two) times a week. Cox, Kirsten, MD Taking Active   zolpidem  (AMBIEN ) 10 MG tablet [id number] Yes Take 1 tablet (10 mg total) by mouth at bedtime. Cox, Kirsten, MD Taking Active             Home Care and Equipment/Supplies: Were Home Health Services Ordered?: Yes Name of Home Health Agency:: [facility] Has Agency set up a time to come to your home?: Yes First Home Health Visit Date: 08/23/23 Any new equipment or medical supplies ordered?: No  Functional Questionnaire: Do you need assistance with bathing/showering or dressing?: No Do you need assistance with meal  preparation?: No Do you need assistance with eating?: No Do you have difficulty maintaining continence: No Do you need assistance with getting out of bed/getting out of a chair/moving?: Yes Do you have difficulty managing or taking your medications?: No  Follow up appointments reviewed: PCP Follow-up appointment confirmed?: Yes Date of PCP follow-up appointment?: 08/30/23 Follow-up Provider: Dr Sirivol Specialist Hospital Follow-up appointment confirmed?: Yes Date of Specialist follow-up appointment?: 08/27/23 Follow-Up Specialty Provider:: Dr Joesph Do you need transportation to your follow-up appointment?: No Do you understand care options if your condition(s) worsen?: Yes-patient verbalized understanding    SIGNATURE: Luke Sharps, LPN  97/95/74 89:96 AM

## 2023-08-27 DIAGNOSIS — U071 COVID-19: Secondary | ICD-10-CM | POA: Diagnosis not present

## 2023-08-27 DIAGNOSIS — F1021 Alcohol dependence, in remission: Secondary | ICD-10-CM | POA: Diagnosis not present

## 2023-08-27 DIAGNOSIS — A419 Sepsis, unspecified organism: Secondary | ICD-10-CM | POA: Diagnosis not present

## 2023-08-27 DIAGNOSIS — N390 Urinary tract infection, site not specified: Secondary | ICD-10-CM | POA: Diagnosis not present

## 2023-08-27 DIAGNOSIS — N179 Acute kidney failure, unspecified: Secondary | ICD-10-CM | POA: Diagnosis not present

## 2023-08-27 DIAGNOSIS — A4102 Sepsis due to Methicillin resistant Staphylococcus aureus: Secondary | ICD-10-CM | POA: Diagnosis not present

## 2023-08-27 DIAGNOSIS — Z89412 Acquired absence of left great toe: Secondary | ICD-10-CM | POA: Diagnosis not present

## 2023-08-27 DIAGNOSIS — D631 Anemia in chronic kidney disease: Secondary | ICD-10-CM | POA: Diagnosis not present

## 2023-08-27 DIAGNOSIS — R911 Solitary pulmonary nodule: Secondary | ICD-10-CM | POA: Diagnosis not present

## 2023-08-27 DIAGNOSIS — E876 Hypokalemia: Secondary | ICD-10-CM | POA: Diagnosis not present

## 2023-08-27 DIAGNOSIS — S20211A Contusion of right front wall of thorax, initial encounter: Secondary | ICD-10-CM | POA: Diagnosis not present

## 2023-08-27 DIAGNOSIS — M869 Osteomyelitis, unspecified: Secondary | ICD-10-CM | POA: Diagnosis not present

## 2023-08-27 DIAGNOSIS — M7989 Other specified soft tissue disorders: Secondary | ICD-10-CM | POA: Diagnosis not present

## 2023-08-27 DIAGNOSIS — G43909 Migraine, unspecified, not intractable, without status migrainosus: Secondary | ICD-10-CM | POA: Diagnosis not present

## 2023-08-27 DIAGNOSIS — E1122 Type 2 diabetes mellitus with diabetic chronic kidney disease: Secondary | ICD-10-CM | POA: Diagnosis not present

## 2023-08-27 DIAGNOSIS — E1169 Type 2 diabetes mellitus with other specified complication: Secondary | ICD-10-CM | POA: Diagnosis not present

## 2023-08-27 DIAGNOSIS — J9811 Atelectasis: Secondary | ICD-10-CM | POA: Diagnosis not present

## 2023-08-27 DIAGNOSIS — B965 Pseudomonas (aeruginosa) (mallei) (pseudomallei) as the cause of diseases classified elsewhere: Secondary | ICD-10-CM | POA: Diagnosis not present

## 2023-08-27 DIAGNOSIS — D72829 Elevated white blood cell count, unspecified: Secondary | ICD-10-CM | POA: Diagnosis not present

## 2023-08-27 DIAGNOSIS — R269 Unspecified abnormalities of gait and mobility: Secondary | ICD-10-CM | POA: Diagnosis not present

## 2023-08-27 DIAGNOSIS — M86172 Other acute osteomyelitis, left ankle and foot: Secondary | ICD-10-CM | POA: Diagnosis not present

## 2023-08-27 DIAGNOSIS — I959 Hypotension, unspecified: Secondary | ICD-10-CM | POA: Diagnosis not present

## 2023-08-27 DIAGNOSIS — B9562 Methicillin resistant Staphylococcus aureus infection as the cause of diseases classified elsewhere: Secondary | ICD-10-CM | POA: Insufficient documentation

## 2023-08-27 DIAGNOSIS — E11649 Type 2 diabetes mellitus with hypoglycemia without coma: Secondary | ICD-10-CM | POA: Diagnosis not present

## 2023-08-27 DIAGNOSIS — K7030 Alcoholic cirrhosis of liver without ascites: Secondary | ICD-10-CM | POA: Diagnosis not present

## 2023-08-27 DIAGNOSIS — E892 Postprocedural hypoparathyroidism: Secondary | ICD-10-CM | POA: Diagnosis not present

## 2023-08-27 DIAGNOSIS — L03116 Cellulitis of left lower limb: Secondary | ICD-10-CM | POA: Diagnosis not present

## 2023-08-27 DIAGNOSIS — N1832 Chronic kidney disease, stage 3b: Secondary | ICD-10-CM | POA: Diagnosis not present

## 2023-08-27 DIAGNOSIS — K766 Portal hypertension: Secondary | ICD-10-CM | POA: Diagnosis not present

## 2023-08-27 DIAGNOSIS — E871 Hypo-osmolality and hyponatremia: Secondary | ICD-10-CM | POA: Diagnosis not present

## 2023-08-27 DIAGNOSIS — Z4781 Encounter for orthopedic aftercare following surgical amputation: Secondary | ICD-10-CM | POA: Diagnosis not present

## 2023-08-27 DIAGNOSIS — R7881 Bacteremia: Secondary | ICD-10-CM | POA: Insufficient documentation

## 2023-08-28 DIAGNOSIS — J159 Unspecified bacterial pneumonia: Secondary | ICD-10-CM | POA: Diagnosis not present

## 2023-08-28 DIAGNOSIS — M86171 Other acute osteomyelitis, right ankle and foot: Secondary | ICD-10-CM | POA: Diagnosis not present

## 2023-08-28 DIAGNOSIS — N200 Calculus of kidney: Secondary | ICD-10-CM | POA: Diagnosis not present

## 2023-08-28 DIAGNOSIS — Z89419 Acquired absence of unspecified great toe: Secondary | ICD-10-CM | POA: Diagnosis not present

## 2023-08-28 DIAGNOSIS — N179 Acute kidney failure, unspecified: Secondary | ICD-10-CM | POA: Diagnosis not present

## 2023-08-28 DIAGNOSIS — Z7401 Bed confinement status: Secondary | ICD-10-CM | POA: Diagnosis not present

## 2023-08-28 DIAGNOSIS — E43 Unspecified severe protein-calorie malnutrition: Secondary | ICD-10-CM | POA: Diagnosis not present

## 2023-08-28 DIAGNOSIS — K7460 Unspecified cirrhosis of liver: Secondary | ICD-10-CM | POA: Diagnosis not present

## 2023-08-28 DIAGNOSIS — S20212S Contusion of left front wall of thorax, sequela: Secondary | ICD-10-CM

## 2023-08-28 DIAGNOSIS — R918 Other nonspecific abnormal finding of lung field: Secondary | ICD-10-CM | POA: Diagnosis not present

## 2023-08-28 DIAGNOSIS — K766 Portal hypertension: Secondary | ICD-10-CM | POA: Diagnosis not present

## 2023-08-28 DIAGNOSIS — Y95 Nosocomial condition: Secondary | ICD-10-CM | POA: Diagnosis not present

## 2023-08-28 DIAGNOSIS — K439 Ventral hernia without obstruction or gangrene: Secondary | ICD-10-CM | POA: Diagnosis not present

## 2023-08-28 DIAGNOSIS — M868X7 Other osteomyelitis, ankle and foot: Secondary | ICD-10-CM | POA: Diagnosis not present

## 2023-08-28 DIAGNOSIS — S98122A Partial traumatic amputation of left great toe, initial encounter: Secondary | ICD-10-CM | POA: Diagnosis not present

## 2023-08-28 DIAGNOSIS — Z79899 Other long term (current) drug therapy: Secondary | ICD-10-CM | POA: Diagnosis not present

## 2023-08-28 DIAGNOSIS — E871 Hypo-osmolality and hyponatremia: Secondary | ICD-10-CM | POA: Diagnosis not present

## 2023-08-28 DIAGNOSIS — Z8614 Personal history of Methicillin resistant Staphylococcus aureus infection: Secondary | ICD-10-CM | POA: Diagnosis not present

## 2023-08-28 DIAGNOSIS — Z9229 Personal history of other drug therapy: Secondary | ICD-10-CM | POA: Diagnosis not present

## 2023-08-28 DIAGNOSIS — E890 Postprocedural hypothyroidism: Secondary | ICD-10-CM | POA: Diagnosis not present

## 2023-08-28 DIAGNOSIS — J440 Chronic obstructive pulmonary disease with acute lower respiratory infection: Secondary | ICD-10-CM | POA: Diagnosis not present

## 2023-08-28 DIAGNOSIS — F0393 Unspecified dementia, unspecified severity, with mood disturbance: Secondary | ICD-10-CM | POA: Diagnosis not present

## 2023-08-28 DIAGNOSIS — E869 Volume depletion, unspecified: Secondary | ICD-10-CM | POA: Diagnosis not present

## 2023-08-28 DIAGNOSIS — N189 Chronic kidney disease, unspecified: Secondary | ICD-10-CM | POA: Diagnosis not present

## 2023-08-28 DIAGNOSIS — R531 Weakness: Secondary | ICD-10-CM | POA: Diagnosis not present

## 2023-08-28 DIAGNOSIS — Z8673 Personal history of transient ischemic attack (TIA), and cerebral infarction without residual deficits: Secondary | ICD-10-CM | POA: Diagnosis not present

## 2023-08-28 DIAGNOSIS — N1831 Chronic kidney disease, stage 3a: Secondary | ICD-10-CM | POA: Diagnosis not present

## 2023-08-28 DIAGNOSIS — J449 Chronic obstructive pulmonary disease, unspecified: Secondary | ICD-10-CM | POA: Diagnosis not present

## 2023-08-28 DIAGNOSIS — G459 Transient cerebral ischemic attack, unspecified: Secondary | ICD-10-CM | POA: Diagnosis not present

## 2023-08-28 DIAGNOSIS — D649 Anemia, unspecified: Secondary | ICD-10-CM | POA: Diagnosis not present

## 2023-08-28 DIAGNOSIS — R6520 Severe sepsis without septic shock: Secondary | ICD-10-CM | POA: Diagnosis not present

## 2023-08-28 DIAGNOSIS — F0394 Unspecified dementia, unspecified severity, with anxiety: Secondary | ICD-10-CM | POA: Diagnosis not present

## 2023-08-28 DIAGNOSIS — E876 Hypokalemia: Secondary | ICD-10-CM | POA: Diagnosis not present

## 2023-08-28 DIAGNOSIS — R7881 Bacteremia: Secondary | ICD-10-CM | POA: Diagnosis not present

## 2023-08-28 DIAGNOSIS — Z89412 Acquired absence of left great toe: Secondary | ICD-10-CM | POA: Diagnosis not present

## 2023-08-28 DIAGNOSIS — A419 Sepsis, unspecified organism: Secondary | ICD-10-CM | POA: Diagnosis not present

## 2023-08-28 DIAGNOSIS — M869 Osteomyelitis, unspecified: Secondary | ICD-10-CM | POA: Diagnosis not present

## 2023-08-28 DIAGNOSIS — Z85850 Personal history of malignant neoplasm of thyroid: Secondary | ICD-10-CM | POA: Diagnosis not present

## 2023-08-28 DIAGNOSIS — J189 Pneumonia, unspecified organism: Secondary | ICD-10-CM | POA: Diagnosis not present

## 2023-08-28 DIAGNOSIS — D72829 Elevated white blood cell count, unspecified: Secondary | ICD-10-CM | POA: Diagnosis not present

## 2023-08-28 DIAGNOSIS — Z209 Contact with and (suspected) exposure to unspecified communicable disease: Secondary | ICD-10-CM | POA: Diagnosis not present

## 2023-08-28 DIAGNOSIS — F32A Depression, unspecified: Secondary | ICD-10-CM | POA: Insufficient documentation

## 2023-08-28 DIAGNOSIS — Z978 Presence of other specified devices: Secondary | ICD-10-CM | POA: Diagnosis not present

## 2023-08-28 DIAGNOSIS — K5900 Constipation, unspecified: Secondary | ICD-10-CM | POA: Diagnosis not present

## 2023-08-28 DIAGNOSIS — N6489 Other specified disorders of breast: Secondary | ICD-10-CM | POA: Diagnosis not present

## 2023-08-28 DIAGNOSIS — B9562 Methicillin resistant Staphylococcus aureus infection as the cause of diseases classified elsewhere: Secondary | ICD-10-CM | POA: Diagnosis not present

## 2023-08-28 DIAGNOSIS — S20212A Contusion of left front wall of thorax, initial encounter: Secondary | ICD-10-CM | POA: Diagnosis not present

## 2023-08-28 DIAGNOSIS — Z9884 Bariatric surgery status: Secondary | ICD-10-CM | POA: Diagnosis not present

## 2023-08-28 DIAGNOSIS — Z743 Need for continuous supervision: Secondary | ICD-10-CM | POA: Diagnosis not present

## 2023-08-28 DIAGNOSIS — F419 Anxiety disorder, unspecified: Secondary | ICD-10-CM | POA: Diagnosis not present

## 2023-08-28 HISTORY — DX: Contusion of left front wall of thorax, sequela: S20.212S

## 2023-08-30 ENCOUNTER — Telehealth: Payer: Self-pay

## 2023-08-30 ENCOUNTER — Inpatient Hospital Stay: Payer: Medicare HMO

## 2023-08-30 NOTE — Telephone Encounter (Signed)
 This paperwork was put in dr. Rolland Cline box

## 2023-09-01 ENCOUNTER — Other Ambulatory Visit: Payer: Self-pay | Admitting: Family Medicine

## 2023-09-02 ENCOUNTER — Telehealth: Payer: Self-pay

## 2023-09-02 NOTE — Telephone Encounter (Signed)
Copied from CRM [id number]. Topic: Appointments - Appointment Scheduling >> Sep 02, 2023  9:40 AM Dimitri Ped wrote: Patient/patient representative is calling to schedule an appointment. Refer to attachments for appointment information. Patient was calling to get a appointment scheduled for a hospital follow up . I got patient scheduled for next Thursday . But patient says doctor at the [facility] said it should be soon as possible because she also needs labs too . Please reach out if there are any earlier availabilities with Dr Sedalia Muta [phone]

## 2023-09-03 ENCOUNTER — Ambulatory Visit: Payer: Self-pay | Admitting: Family Medicine

## 2023-09-03 ENCOUNTER — Telehealth: Payer: Self-pay

## 2023-09-03 DIAGNOSIS — S98112A Complete traumatic amputation of left great toe, initial encounter: Secondary | ICD-10-CM | POA: Diagnosis not present

## 2023-09-03 DIAGNOSIS — M869 Osteomyelitis, unspecified: Secondary | ICD-10-CM | POA: Diagnosis not present

## 2023-09-03 DIAGNOSIS — L97522 Non-pressure chronic ulcer of other part of left foot with fat layer exposed: Secondary | ICD-10-CM | POA: Diagnosis not present

## 2023-09-03 NOTE — Transitions of Care (Post Inpatient/ED Visit) (Signed)
   09/03/2023  Name: Molly Parrish MRN: [id number] DOB: Apr 07, 1966  Today's TOC FU Call Status: Today's TOC FU Call Status:: Unsuccessful Call (1st Attempt) Unsuccessful Call (1st Attempt) Date: 09/03/23  Attempted to reach the patient regarding the most recent Inpatient/ED visit. Spoke with patient and she requested a call back Monday as she did not feel well.  Follow Up Plan: Additional outreach attempts will be made to reach the patient to complete the Transitions of Care (Post Inpatient/ED visit) call.   Jodelle Gross RN, BSN, CCM [place]  [facility] Manager Population Health Direct Dial: [phone]  Fax: [phone]

## 2023-09-03 NOTE — Telephone Encounter (Signed)
Chief Complaint: Shortness of Breath Symptoms: SOB s/p PNA Frequency: discharged yesterday from [facility] Pertinent Negatives: Patient denies fever Disposition: [] ED /[] Urgent Care (no appt availability in office) / [] Appointment(In office/virtual)/ []  [facility]/ [] Home Care/ [x] Refused Recommended Disposition /[] [place] [facility]/ []  Follow-up with PCP Additional Notes: patient was discharged from [facility] yesterday for PNA. Patient states shortness of breath increased after being at home. Inhalers aren't working. Patient states she was better in the hospital getting breathing treatments. Patient was wanting to get a prescription for nebulizer breathing treatments. Patient does sound short of breath speaking to RN. Per protocol, patient is recommended to go back to ED for evaluation. Patient refused Emergency Department disposition. Patient verbalized understanding and all questions answered.  Patient is requesting call from staff if breathing treatments are okay   Copied from CRM [id number]. Topic: Clinical - Red Word Triage >> Sep 03, 2023 11:10 AM Gildardo Pounds wrote: Red Word that prompted transfer to Nurse Triage: Patient released from [facility], shortness of breath getting worse, and inhaler is not working. [phone] Reason for Disposition  [1] MODERATE difficulty breathing (e.g., speaks in phrases, SOB even at rest, pulse 100-120) AND [2] NEW-onset or WORSE than normal  Answer Assessment - Initial Assessment Questions 1. RESPIRATORY STATUS: "Describe your breathing?" (e.g., wheezing, shortness of breath, unable to speak, severe coughing)      Shortness of breath with any movement 2. ONSET: "When did this breathing problem begin?"      Patient states that she was better while in the hospital while getting breathing treatments-patient states shortness of breath got worse when she was discharged with only inhalers 3. PATTERN "Does the  difficult breathing come and go, or has it been constant since it started?"      constatnt 4. SEVERITY: "How bad is your breathing?" (e.g., mild, moderate, severe)    - MILD: No SOB at rest, mild SOB with walking, speaks normally in sentences, can lie down, no retractions, pulse < 100.    - MODERATE: SOB at rest, SOB with minimal exertion and prefers to sit, cannot lie down flat, speaks in phrases, mild retractions, audible wheezing, pulse 100-120.    - SEVERE: Very SOB at rest, speaks in single words, struggling to breathe, sitting hunched forward, retractions, pulse > 120      Moderate 5. RECURRENT SYMPTOM: "Have you had difficulty breathing before?" If Yes, ask: "When was the last time?" and "What happened that time?"      Yes when she was admitted to the hopsital 6. CARDIAC HISTORY: "Do you have any history of heart disease?" (e.g., heart attack, angina, bypass surgery, angioplasty)      No 7. LUNG HISTORY: "Do you have any history of lung disease?"  (e.g., pulmonary embolus, asthma, emphysema)     No 8. CAUSE: "What do you think is causing the breathing problem?"      Had recently been in the hospital for PNA 9. OTHER SYMPTOMS: "Do you have any other symptoms? (e.g., dizziness, runny nose, cough, chest pain, fever)     Dizziness 10. O2 SATURATION MONITOR:  "Do you use an oxygen saturation monitor (pulse oximeter) at home?" If Yes, ask: "What is your reading (oxygen level) today?" "What is your usual oxygen saturation reading?" (e.g., 95%)       No 12. TRAVEL: "Have you traveled out of the country in the last month?" (e.g., travel history, exposures)  No  Protocols used: Breathing Difficulty-A-AH

## 2023-09-04 DIAGNOSIS — M869 Osteomyelitis, unspecified: Secondary | ICD-10-CM | POA: Diagnosis not present

## 2023-09-05 DIAGNOSIS — M869 Osteomyelitis, unspecified: Secondary | ICD-10-CM | POA: Diagnosis not present

## 2023-09-06 ENCOUNTER — Encounter: Payer: Self-pay | Admitting: Family Medicine

## 2023-09-06 ENCOUNTER — Inpatient Hospital Stay: Payer: Medicare HMO | Admitting: Family Medicine

## 2023-09-06 DIAGNOSIS — R269 Unspecified abnormalities of gait and mobility: Secondary | ICD-10-CM | POA: Diagnosis not present

## 2023-09-06 DIAGNOSIS — B965 Pseudomonas (aeruginosa) (mallei) (pseudomallei) as the cause of diseases classified elsewhere: Secondary | ICD-10-CM | POA: Diagnosis not present

## 2023-09-06 DIAGNOSIS — G43909 Migraine, unspecified, not intractable, without status migrainosus: Secondary | ICD-10-CM | POA: Diagnosis not present

## 2023-09-06 DIAGNOSIS — N179 Acute kidney failure, unspecified: Secondary | ICD-10-CM | POA: Diagnosis not present

## 2023-09-06 DIAGNOSIS — U071 COVID-19: Secondary | ICD-10-CM | POA: Diagnosis not present

## 2023-09-06 DIAGNOSIS — D72829 Elevated white blood cell count, unspecified: Secondary | ICD-10-CM | POA: Diagnosis not present

## 2023-09-06 DIAGNOSIS — L03116 Cellulitis of left lower limb: Secondary | ICD-10-CM | POA: Diagnosis not present

## 2023-09-06 DIAGNOSIS — E871 Hypo-osmolality and hyponatremia: Secondary | ICD-10-CM | POA: Diagnosis not present

## 2023-09-06 DIAGNOSIS — K7030 Alcoholic cirrhosis of liver without ascites: Secondary | ICD-10-CM | POA: Diagnosis not present

## 2023-09-06 DIAGNOSIS — M869 Osteomyelitis, unspecified: Secondary | ICD-10-CM | POA: Diagnosis not present

## 2023-09-06 DIAGNOSIS — I959 Hypotension, unspecified: Secondary | ICD-10-CM | POA: Diagnosis not present

## 2023-09-06 DIAGNOSIS — A4102 Sepsis due to Methicillin resistant Staphylococcus aureus: Secondary | ICD-10-CM | POA: Diagnosis not present

## 2023-09-06 DIAGNOSIS — E876 Hypokalemia: Secondary | ICD-10-CM | POA: Diagnosis not present

## 2023-09-06 DIAGNOSIS — E1122 Type 2 diabetes mellitus with diabetic chronic kidney disease: Secondary | ICD-10-CM | POA: Diagnosis not present

## 2023-09-06 DIAGNOSIS — Z4781 Encounter for orthopedic aftercare following surgical amputation: Secondary | ICD-10-CM | POA: Diagnosis not present

## 2023-09-06 DIAGNOSIS — M86172 Other acute osteomyelitis, left ankle and foot: Secondary | ICD-10-CM | POA: Diagnosis not present

## 2023-09-06 DIAGNOSIS — F1021 Alcohol dependence, in remission: Secondary | ICD-10-CM | POA: Diagnosis not present

## 2023-09-06 DIAGNOSIS — R911 Solitary pulmonary nodule: Secondary | ICD-10-CM | POA: Diagnosis not present

## 2023-09-06 DIAGNOSIS — K766 Portal hypertension: Secondary | ICD-10-CM | POA: Diagnosis not present

## 2023-09-06 DIAGNOSIS — E892 Postprocedural hypoparathyroidism: Secondary | ICD-10-CM | POA: Diagnosis not present

## 2023-09-06 DIAGNOSIS — N390 Urinary tract infection, site not specified: Secondary | ICD-10-CM | POA: Diagnosis not present

## 2023-09-06 DIAGNOSIS — E11649 Type 2 diabetes mellitus with hypoglycemia without coma: Secondary | ICD-10-CM | POA: Diagnosis not present

## 2023-09-06 DIAGNOSIS — N1832 Chronic kidney disease, stage 3b: Secondary | ICD-10-CM | POA: Diagnosis not present

## 2023-09-06 DIAGNOSIS — E1169 Type 2 diabetes mellitus with other specified complication: Secondary | ICD-10-CM | POA: Diagnosis not present

## 2023-09-06 DIAGNOSIS — D631 Anemia in chronic kidney disease: Secondary | ICD-10-CM | POA: Diagnosis not present

## 2023-09-07 ENCOUNTER — Telehealth: Payer: Self-pay | Admitting: Family Medicine

## 2023-09-07 ENCOUNTER — Encounter: Payer: Self-pay | Admitting: Family Medicine

## 2023-09-07 ENCOUNTER — Ambulatory Visit: Payer: Medicare HMO | Admitting: Family Medicine

## 2023-09-07 VITALS — BP 104/60 | HR 89 | Temp 97.2°F | Ht 66.0 in | Wt 153.0 lb

## 2023-09-07 DIAGNOSIS — S20212A Contusion of left front wall of thorax, initial encounter: Secondary | ICD-10-CM

## 2023-09-07 DIAGNOSIS — M86172 Other acute osteomyelitis, left ankle and foot: Secondary | ICD-10-CM | POA: Diagnosis not present

## 2023-09-07 DIAGNOSIS — E11649 Type 2 diabetes mellitus with hypoglycemia without coma: Secondary | ICD-10-CM | POA: Diagnosis not present

## 2023-09-07 DIAGNOSIS — M868X7 Other osteomyelitis, ankle and foot: Secondary | ICD-10-CM | POA: Diagnosis not present

## 2023-09-07 DIAGNOSIS — G43909 Migraine, unspecified, not intractable, without status migrainosus: Secondary | ICD-10-CM | POA: Diagnosis not present

## 2023-09-07 DIAGNOSIS — R911 Solitary pulmonary nodule: Secondary | ICD-10-CM | POA: Diagnosis not present

## 2023-09-07 DIAGNOSIS — F1021 Alcohol dependence, in remission: Secondary | ICD-10-CM | POA: Diagnosis not present

## 2023-09-07 DIAGNOSIS — A4102 Sepsis due to Methicillin resistant Staphylococcus aureus: Secondary | ICD-10-CM

## 2023-09-07 DIAGNOSIS — N179 Acute kidney failure, unspecified: Secondary | ICD-10-CM | POA: Diagnosis not present

## 2023-09-07 DIAGNOSIS — I959 Hypotension, unspecified: Secondary | ICD-10-CM | POA: Diagnosis not present

## 2023-09-07 DIAGNOSIS — D631 Anemia in chronic kidney disease: Secondary | ICD-10-CM | POA: Diagnosis not present

## 2023-09-07 DIAGNOSIS — E876 Hypokalemia: Secondary | ICD-10-CM

## 2023-09-07 DIAGNOSIS — R269 Unspecified abnormalities of gait and mobility: Secondary | ICD-10-CM | POA: Diagnosis not present

## 2023-09-07 DIAGNOSIS — B965 Pseudomonas (aeruginosa) (mallei) (pseudomallei) as the cause of diseases classified elsewhere: Secondary | ICD-10-CM | POA: Diagnosis not present

## 2023-09-07 DIAGNOSIS — N390 Urinary tract infection, site not specified: Secondary | ICD-10-CM | POA: Diagnosis not present

## 2023-09-07 DIAGNOSIS — L03116 Cellulitis of left lower limb: Secondary | ICD-10-CM | POA: Diagnosis not present

## 2023-09-07 DIAGNOSIS — K7030 Alcoholic cirrhosis of liver without ascites: Secondary | ICD-10-CM | POA: Diagnosis not present

## 2023-09-07 DIAGNOSIS — M869 Osteomyelitis, unspecified: Secondary | ICD-10-CM | POA: Diagnosis not present

## 2023-09-07 DIAGNOSIS — E892 Postprocedural hypoparathyroidism: Secondary | ICD-10-CM | POA: Diagnosis not present

## 2023-09-07 DIAGNOSIS — D638 Anemia in other chronic diseases classified elsewhere: Secondary | ICD-10-CM | POA: Diagnosis not present

## 2023-09-07 DIAGNOSIS — E1169 Type 2 diabetes mellitus with other specified complication: Secondary | ICD-10-CM | POA: Diagnosis not present

## 2023-09-07 DIAGNOSIS — D72829 Elevated white blood cell count, unspecified: Secondary | ICD-10-CM | POA: Diagnosis not present

## 2023-09-07 DIAGNOSIS — E871 Hypo-osmolality and hyponatremia: Secondary | ICD-10-CM | POA: Diagnosis not present

## 2023-09-07 DIAGNOSIS — K766 Portal hypertension: Secondary | ICD-10-CM | POA: Diagnosis not present

## 2023-09-07 DIAGNOSIS — N1832 Chronic kidney disease, stage 3b: Secondary | ICD-10-CM | POA: Diagnosis not present

## 2023-09-07 DIAGNOSIS — E1122 Type 2 diabetes mellitus with diabetic chronic kidney disease: Secondary | ICD-10-CM | POA: Diagnosis not present

## 2023-09-07 DIAGNOSIS — U071 COVID-19: Secondary | ICD-10-CM | POA: Diagnosis not present

## 2023-09-07 DIAGNOSIS — Z4781 Encounter for orthopedic aftercare following surgical amputation: Secondary | ICD-10-CM | POA: Diagnosis not present

## 2023-09-07 NOTE — Telephone Encounter (Unsigned)
 Copied from CRM [id number]. Topic: Clinical - Medication Refill >> Sep 07, 2023  2:44 PM Higinio Roger wrote: Most Recent Primary Care Visit:  Provider: [facility], KIRSTEN  Department[facility]  Visit Type: HOSPITAL FOLLOW UP  Date: 09/07/2023  Medication: fluconazole (Diflucan)   Has the patient contacted their pharmacy? No (Agent: If no, request that the patient contact the pharmacy for the refill. If patient does not wish to contact the pharmacy document the reason why and proceed with request.)  Patient forgot to mention yeast infection at appointment. (Agent: If yes, when and what did the pharmacy advise?)  Is this the correct pharmacy for this prescription? Yes If no, delete pharmacy and type the correct one.  This is the patient's preferred pharmacy:  [facility] [address], [place] - [address] [place] [address] Phone: [phone] Fax: [phone]   Has the prescription been filled recently? No  Is the patient out of the medication? Yes  Has the patient been seen for an appointment in the last year OR does the patient have an upcoming appointment? Yes  Can we respond through MyChart? Yes  Agent: Please be advised that Rx refills may take up to 3 business days. We ask that you follow-up with your pharmacy.

## 2023-09-07 NOTE — Telephone Encounter (Unsigned)
 Copied from CRM [id number]. Topic: General - Call Back - No Documentation >> Sep 07, 2023  2:35 PM Higinio Roger wrote: Patient is requesting a callback to discuss medication for yeast infection. Patient would like fluconazole (Diflucan)  sent in to the pharmacy: [facility] [address] [place] - [address]  Callback #: [phone]

## 2023-09-07 NOTE — Progress Notes (Unsigned)
 Subjective:  Patient ID: Molly Parrish, female    DOB: 11-17-1965  Age: 58 y.o. MRN: [id number]  Chief Complaint  Patient presents with   Hospitalization Follow-up    HPI  The patient, with a history of osteomyelitis of the left great toe leading to partial amputation, septic shock secondary to MRSA bacteremia, and a kidney infection, presents with shortness of breath and general weakness. The patient was hospitalized for 21 days between January and February. During this time, the patient also developed a hematoma in the chest wall, which required surgical intervention. The patient has been on multiple antibiotics, including daptomycin and ciprofloxacin, and is currently receiving home health care. Patient is completing The patient also reports a history of low sodium and potassium levels, lung nodules found on a CT scan, and headaches. The patient's toe amputation wound is being managed with daily dressing changes at home.      05/07/2023   10:44 AM 03/01/2023    3:33 PM 06/15/2022   10:02 AM 12/05/2021   10:55 AM 11/14/2021   11:16 AM  Depression screen PHQ 2/9  Decreased Interest 0 0 0 0 0  Down, Depressed, Hopeless 0 0 0 0 0  PHQ - 2 Score 0 0 0 0 0  Altered sleeping  0 3    Tired, decreased energy  3 3    Change in appetite  0 0    Feeling bad or failure about yourself   0 0    Trouble concentrating  0 0    Moving slowly or fidgety/restless   1    Suicidal thoughts  0 0    PHQ-9 Score  3 7    Difficult doing work/chores  Not difficult at all Somewhat difficult          05/07/2023   10:44 AM  Fall Risk   Falls in the past year? 1  Number falls in past yr: 1  Injury with Fall? 1    Patient Care Team: Blane Ohara, MD as PCP - General (Family Medicine) Doristine Bosworth., MD (Endocrinology) Carney Harder, MD as Referring Physician (Geriatric Medicine) [facility] Geraldine Contras, MD as Referring Physician (Psychiatry) Ancil Boozer, DPM  (Inactive) as Referring Physician (Podiatry) Weston Settle, MD as Consulting Physician (Oncology) Barnabas Lister, MD as Referring Physician (Nephrology)   Review of Systems  Constitutional:  Negative for chills, fatigue and fever.  HENT:  Negative for congestion, ear pain, rhinorrhea and sore throat.   Respiratory:  Positive for shortness of breath. Negative for cough.   Cardiovascular:  Negative for chest pain.  Gastrointestinal:  Negative for abdominal pain, constipation, diarrhea, nausea and vomiting.  Genitourinary:  Negative for dysuria and urgency.  Musculoskeletal:  Negative for back pain and myalgias.  Neurological:  Positive for weakness. Negative for dizziness, light-headedness and headaches.  Psychiatric/Behavioral:  Negative for dysphoric mood. The patient is not nervous/anxious.     Current Outpatient Medications on File Prior to Visit  Medication Sig Dispense Refill   acetaminophen (TYLENOL) 325 MG tablet Take by mouth.     ALPRAZolam (XANAX) 0.25 MG tablet Take 1 tablet by mouth twice daily as needed for anxiety 60 tablet 0   budesonide-formoterol (SYMBICORT) 160-4.5 MCG/ACT inhaler Inhale 2 puffs into the lungs 2 (two) times daily. 10.2 g 2   calcitRIOL (ROCALTROL) 0.5 MCG capsule Take 0.5 mcg by mouth 2 (two) times daily.     calcium carbonate (OSCAL) 1500 (600 Ca) MG TABS tablet  Take 3,600 mg by mouth 2 (two) times daily with a meal.     CAPVAXIVE 0.5 ML injection      CONSTULOSE 10 GM/15ML solution Take 20 g by mouth 2 (two) times daily.     Cyanocobalamin (VITAMIN B 12 PO) Take 1,000 mcg by mouth daily.     cyclobenzaprine (FLEXERIL) 10 MG tablet Take 1 tablet by mouth three times daily as needed for muscle spasm 90 tablet 0   DAPTOmycin (CUBICIN) 500 MG injection Inject into the vein.     famotidine (PEPCID) 40 MG tablet Take 40 mg by mouth at bedtime.     furosemide (LASIX) 40 MG tablet Take 2 tablets (80 mg total) by mouth 2 (two) times daily. 360 tablet 0    Glucagon (GVOKE HYPOPEN 2-PACK) 0.5 MG/0.1ML SOAJ If glucose less than 60, use gvoke pen x 1. May repeat if not responding x 1 daily. Call EMS if not improving. 0.2 mL 2   hydrOXYzine (VISTARIL) 25 MG capsule TAKE 1 CAPSULE BY MOUTH THREE TIMES DAILY 90 capsule 5   Iron-Vitamins (GERITOL COMPLETE) TABS Take 1 tablet by mouth daily.     lamoTRIgine (LAMICTAL) 200 MG tablet Take 200 mg by mouth at bedtime.     levothyroxine (SYNTHROID) 150 MCG tablet Take 1 tablet (150 mcg total) by mouth daily before breakfast. 90 tablet 0   magnesium oxide (MAG-OX) 400 MG tablet Take 400 mg by mouth daily.     midodrine (PROAMATINE) 2.5 MG tablet Take 1 tablet (2.5 mg total) by mouth 3 (three) times daily with meals. 90 tablet 0   omeprazole (PRILOSEC) 40 MG capsule Take 40 mg by mouth daily.     ondansetron (ZOFRAN) 4 MG tablet TAKE 1 TABLET(4 MG) BY MOUTH EVERY 8 HOURS AS NEEDED FOR NAUSEA OR VOMITING 60 tablet 3   pantoprazole (PROTONIX) 40 MG tablet TAKE 1 TABLET(40 MG) BY MOUTH DAILY 90 tablet 0   phentermine (ADIPEX-P) 37.5 MG tablet Take 37.5 mg by mouth every morning.     promethazine (PHENERGAN) 25 MG tablet TAKE 1 TABLET BY MOUTH FOUR TIMES DAILY AS NEEDED FOR NAUSEA OR VOMITING 40 tablet 2   propranolol (INDERAL) 10 MG tablet Take 10 mg by mouth at bedtime.     QUEtiapine (SEROQUEL) 50 MG tablet Take 100 mg by mouth at bedtime.     rifaximin (XIFAXAN) 550 MG TABS tablet Take 550 mg by mouth 2 (two) times daily.     sertraline (ZOLOFT) 100 MG tablet Take 100 mg by mouth in the morning and at bedtime.     sevelamer carbonate (RENVELA) 800 MG tablet Take 800 mg by mouth 3 (three) times daily with meals.     spironolactone (ALDACTONE) 50 MG tablet Take 2 tablets (100 mg total) by mouth 2 (two) times daily. 120 tablet 0   thiamine 100 MG tablet Take 100 mg by mouth daily.     topiramate (TOPAMAX) 100 MG tablet Take 100 mg by mouth at bedtime.     traMADol (ULTRAM) 50 MG tablet TAKE 2 TABLETS BY MOUTH TWICE  DAILY 120 tablet 1   triamcinolone (KENALOG) 0.1 % paste USE AS DIRECTED IN  MOUTH  OR  THROAT  TWICE  DAILY 5 g 0   Vitamin D, Ergocalciferol, (DRISDOL) 1.25 MG (50000 UNIT) CAPS capsule Take 1 capsule (50,000 Units total) by mouth 2 (two) times a week. 24 capsule 0   zolpidem (AMBIEN) 10 MG tablet Take 1 tablet (10 mg total) by mouth  at bedtime. 30 tablet 3   No current facility-administered medications on file prior to visit.   Past Medical History:  Diagnosis Date   Alcohol dependence in remission (HCC) 10/04/2013   Alcoholic cirrhosis of liver (HCC)    Anemia    Anemia of chronic disease 08/08/2016   Arterial hypotension 05/08/2020   Bipolar disorder current episode depressed (HCC)    Brain TIA 02/19/2020   CKD (chronic kidney disease)    Episodic mood disorder (HCC) 11/07/2013   Generalized anxiety disorder    Generalized weakness 07/11/2020   GERD (gastroesophageal reflux disease)    H/O bariatric surgery 06/11/2020   Hypocalcemia 12/10/2015   Hypokalemia    Hyponatremia 02/19/2020   Intestinal malabsorption    Iron deficiency anemia due to chronic blood loss 05/08/2020   Korsakoff syndrome (HCC)    Lumbar disc disease 10/19/2019   Microscopic hematuria 04/16/2021   Migraine 04/20/2014   Mild recurrent major depression (HCC) 04/19/2020   Multiple closed fractures of ribs of left side 06/03/2020   Formatting of this note might be different from the original. Added automatically from request for surgery [id number]   Neuropathy 01/31/2020   Obsessive-compulsive disorder 11/07/2013   Other osteoporosis without current pathological fracture    Peripheral edema 11/01/2020   Polypharmacy 06/12/2020   Portal hypertension (HCC)    Post-surgical hypothyroidism 12/10/2015   Primary insomnia 03/26/2021   Severe protein-calorie malnutrition (HCC) 03/26/2021   Shortness of breath 11/01/2020   Stage 3b chronic kidney disease (HCC) 04/16/2021   Stroke (HCC)     left basal ganglia stroke (hemorrhagic)    Telogen effluvium 03/26/2021   Ulcer of left foot with fat layer exposed (HCC) 10/04/2020   Ulcer of right foot, with fat layer exposed (HCC) 10/10/2020   Vitamin D deficiency    Past Surgical History:  Procedure Laterality Date   BREAST EXCISIONAL BIOPSY Left    CATARACT EXTRACTION     GASTRIC BYPASS  2005   HEMORRHOID SURGERY     HERNIA REPAIR     metatarsus abductus surgery Left 1990   PARTIAL HYSTERECTOMY  05/1997   BL Ovaries remain. Done for fibroids.   THYROIDECTOMY  03/2013    Family History  Problem Relation Age of Onset   Breast cancer Mother    Osteoporosis Mother    Cancer Mother        Breast, lung, skin.    Breast cancer Maternal Grandmother    Social History   Socioeconomic History   Marital status: Married    Spouse name: Not on file   Number of children: 1   Years of education: Not on file   Highest education level: Associate degree: academic program  Occupational History   Occupation: IT trainer    Comment: Retired.  Tobacco Use   Smoking status: Never   Smokeless tobacco: Never  Substance and Sexual Activity   Alcohol use: Not Currently    Comment: history of alcoholism. sober since 2014.   Drug use: Never   Sexual activity: Yes  Other Topics Concern   Not on file  Social History Narrative   Right handed   Social Drivers of Health   Financial Resource Strain: Medium Risk (09/07/2023)   Overall Financial Resource Strain (CARDIA)    Difficulty of Paying Living Expenses: Somewhat hard  Food Insecurity: Low Risk  (08/28/2023)   Received from [facility]   Hunger Vital Sign    Worried About Running Out of Food in the Last Year: Never true  Ran Out of Food in the Last Year: Never true  Transportation Needs: No Transportation Needs (08/28/2023)   Received from [facility]   Transportation    In the past 12 months, has lack of reliable transportation kept you from medical appointments, meetings, work or from getting things needed for daily living? : No   Physical Activity: Insufficiently Active (09/07/2023)   Exercise Vital Sign    Days of Exercise per Week: 3 days    Minutes of Exercise per Session: 30 min  Stress: Stress Concern Present (06/24/2023)   [employer] of Occupational Health - Occupational Stress Questionnaire    Feeling of Stress : Very much  Social Connections: Unknown (09/07/2023)   Social Connection and Isolation Panel [NHANES]    Frequency of Communication with Friends and Family: More than three times a week    Frequency of Social Gatherings with Friends and Family: Twice a week    Attends Religious Services: Patient declined    Database administrator or Organizations: No    Attends Engineer, structural: Never    Marital Status: Married    Objective:  BP 104/60   Pulse 89   Temp (!) 97.2 F (36.2 C)   Ht 5\' 6"  (1.676 m)   Wt 153 lb (69.4 kg)   SpO2 93%   BMI 24.69 kg/m      09/07/2023    1:39 PM 08/02/2023    3:58 PM 07/23/2023   10:44 AM  BP/Weight  Systolic BP 104 98 102  Diastolic BP 60 68 68  Wt. (Lbs) 153 151   BMI 24.69 kg/m2 24.37 kg/m2     Physical Exam Vitals reviewed.  Constitutional:      Appearance: Normal appearance.     Comments: thin  Neck:     Vascular: No carotid bruit.  Cardiovascular:     Rate and Rhythm: Normal rate and regular rhythm.     Heart sounds: Normal heart sounds.  Pulmonary:     Effort: Pulmonary effort is normal. No respiratory distress.     Breath sounds: Normal breath sounds.  Abdominal:     General: Abdomen is flat. Bowel sounds are normal.     Palpations: Abdomen is soft.     Tenderness: There is no abdominal tenderness.  Neurological:     Mental Status: She is alert and oriented to person, place, and time.  Psychiatric:        Mood and Affect: Mood normal.        Behavior: Behavior normal.     Diabetic Foot Exam - Simple   No data filed      Lab Results  Component Value Date   WBC 13.5 (H) 09/07/2023   HGB 8.5 (L) 09/07/2023    HCT 26.5 (L) 09/07/2023   PLT 379 09/07/2023   GLUCOSE 65 (L) 09/07/2023   CHOL 121 03/01/2023   TRIG 65 03/01/2023   HDL 68 03/01/2023   LDLCALC 39 03/01/2023   ALT 18 09/07/2023   AST 21 09/07/2023   NA 143 09/07/2023   K 4.4 09/07/2023   CL 106 09/07/2023   CREATININE 0.98 09/07/2023   BUN 23 09/07/2023   CO2 22 09/07/2023   TSH 30.000 (H) 05/06/2023      Assessment & Plan:    Hyponatremia Assessment & Plan: Check labs  Orders: -     Comprehensive metabolic panel  Anemia of chronic disease Assessment & Plan: Check labs  Orders: -     CBC  with Differential/Platelet  Other osteomyelitis of left foot (HCC) Assessment & Plan: Status post partial toe amputation due to infection. Currently on Ciprofloxacin for the amputated toe. -Continue Ciprofloxacin as prescribed. -Follow up with Dr. Lequita Halt in 2 weeks.   Hematoma of left chest wall, initial encounter Assessment & Plan: Status post surgical intervention. Wound care ongoing at home. -Continue daily wound care at home. -Follow up with Dr. Lequita Halt in 2 weeks.   Sepsis due to methicillin resistant Staphylococcus aureus (MRSA), unspecified whether acute organ dysfunction present [facility]) Assessment & Plan: Current treatment with Daptomycin via PICC line. Home health is assisting with medication administration. -Continue Daptomycin until September 21, 2023. -Ensure home health is assisting with medication administration.   Hypokalemia Assessment & Plan: Check level      No orders of the defined types were placed in this encounter.   Orders Placed This Encounter  Procedures   CBC with Differential/Platelet   Comprehensive metabolic panel     Follow-up: No follow-ups on file.   I,Katherina A Bramblett,acting as a scribe for Blane Ohara, MD.,have documented all relevant documentation on the behalf of Blane Ohara, MD,as directed by  Blane Ohara, MD while in the presence of Blane Ohara, MD.   Clayborn Bigness I  Leal-Borjas,acting as a scribe for Blane Ohara, MD.,have documented all relevant documentation on the behalf of Blane Ohara, MD,as directed by  Blane Ohara, MD while in the presence of Blane Ohara, MD.    An After Visit Summary was printed and given to the patient.  I attest that I have reviewed this visit and agree with the plan scribed by my staff.   Blane Ohara, MD [facility] [phone]

## 2023-09-08 ENCOUNTER — Encounter: Payer: Self-pay | Admitting: Family Medicine

## 2023-09-08 ENCOUNTER — Other Ambulatory Visit: Payer: Self-pay

## 2023-09-08 DIAGNOSIS — K146 Glossodynia: Secondary | ICD-10-CM

## 2023-09-08 DIAGNOSIS — M869 Osteomyelitis, unspecified: Secondary | ICD-10-CM | POA: Diagnosis not present

## 2023-09-08 LAB — COMPREHENSIVE METABOLIC PANEL
ALT: 18 [IU]/L (ref 0–32)
AST: 21 [IU]/L (ref 0–40)
Albumin: 2.7 g/dL — ABNORMAL LOW (ref 3.8–4.9)
Alkaline Phosphatase: 193 [IU]/L — ABNORMAL HIGH (ref 44–121)
BUN/Creatinine Ratio: 23 (ref 9–23)
BUN: 23 mg/dL (ref 6–24)
Bilirubin Total: 0.3 mg/dL (ref 0.0–1.2)
CO2: 22 mmol/L (ref 20–29)
Calcium: 7.9 mg/dL — ABNORMAL LOW (ref 8.7–10.2)
Chloride: 106 mmol/L (ref 96–106)
Creatinine, Ser: 0.98 mg/dL (ref 0.57–1.00)
Globulin, Total: 2.7 g/dL (ref 1.5–4.5)
Glucose: 65 mg/dL — ABNORMAL LOW (ref 70–99)
Potassium: 4.4 mmol/L (ref 3.5–5.2)
Sodium: 143 mmol/L (ref 134–144)
Total Protein: 5.4 g/dL — ABNORMAL LOW (ref 6.0–8.5)
eGFR: 66 mL/min/{1.73_m2} (ref >59)

## 2023-09-08 LAB — CBC WITH DIFFERENTIAL/PLATELET
Basophils Absolute: 0.1 10*3/uL (ref 0.0–0.2)
Basos: 1 %
EOS (ABSOLUTE): 0.3 10*3/uL (ref 0.0–0.4)
Eos: 3 %
Hematocrit: 26.5 % — ABNORMAL LOW (ref 34.0–46.6)
Hemoglobin: 8.5 g/dL — ABNORMAL LOW (ref 11.1–15.9)
Immature Grans (Abs): 0.1 10*3/uL (ref 0.0–0.1)
Immature Granulocytes: 1 %
Lymphocytes Absolute: 1.4 10*3/uL (ref 0.7–3.1)
Lymphs: 10 %
MCH: 29.4 pg (ref 26.6–33.0)
MCHC: 32.1 g/dL (ref 31.5–35.7)
MCV: 92 fL (ref 79–97)
Monocytes Absolute: 0.9 10*3/uL (ref 0.1–0.9)
Monocytes: 6 %
Neutrophils Absolute: 10.8 10*3/uL — ABNORMAL HIGH (ref 1.4–7.0)
Neutrophils: 79 %
Platelets: 379 10*3/uL (ref 150–450)
RBC: 2.89 x10E6/uL — ABNORMAL LOW (ref 3.77–5.28)
RDW: 15.7 % — ABNORMAL HIGH (ref 11.7–15.4)
WBC: 13.5 10*3/uL — ABNORMAL HIGH (ref 3.4–10.8)

## 2023-09-08 MED ORDER — MAGIC MOUTHWASH W/LIDOCAINE: ORAL | 0 refills | Status: DC

## 2023-09-08 MED ORDER — FLUCONAZOLE 150 MG PO TABS: 150.0000 mg | ORAL_TABLET | Freq: Every day | ORAL | 0 refills | Status: DC

## 2023-09-08 MED ORDER — MAGIC MOUTHWASH W/LIDOCAINE
ORAL | 0 refills | Status: DC
Start: 2023-09-08 — End: 2023-09-30

## 2023-09-08 NOTE — Telephone Encounter (Signed)
 Attempted to call patient. Sent message via mychart .  Copied from CRM [id number]. Topic: Clinical - Prescription Issue >> Sep 08, 2023 10:20 AM Gery Pray wrote: Reason for CRM: [facility] was faxed a prescription for a Magic Mouth Wash with Lidocaine. Clydie Braun stated they no longer compound, however, the [facility] pharmacy does. Please contact Clydie Braun at [phone] for additional information.

## 2023-09-09 ENCOUNTER — Telehealth: Payer: Self-pay

## 2023-09-09 ENCOUNTER — Telehealth: Payer: Self-pay | Admitting: Family Medicine

## 2023-09-09 ENCOUNTER — Inpatient Hospital Stay: Payer: Medicare HMO | Admitting: Family Medicine

## 2023-09-09 DIAGNOSIS — M869 Osteomyelitis, unspecified: Secondary | ICD-10-CM | POA: Diagnosis not present

## 2023-09-09 NOTE — Telephone Encounter (Signed)
 [facility]-Order [phone]

## 2023-09-09 NOTE — Telephone Encounter (Signed)
 This has been put in dr. Marcha Solders box

## 2023-09-09 NOTE — Telephone Encounter (Signed)
 Left message informing pharmacy ratio should be 1:1:1.   Copied from CRM [id number]. Topic: Clinical - Prescription Issue >> Sep 08, 2023 12:45 PM Geroge Baseman wrote: Reason for CRM: magic mouthwash w/lidocaine SOLN, [facility] in [place], needs ingredients and mesaurements of. [phone] [facility]. Please advise.

## 2023-09-10 DIAGNOSIS — M869 Osteomyelitis, unspecified: Secondary | ICD-10-CM | POA: Diagnosis not present

## 2023-09-11 ENCOUNTER — Other Ambulatory Visit: Payer: Self-pay | Admitting: Family Medicine

## 2023-09-11 ENCOUNTER — Other Ambulatory Visit: Payer: Self-pay

## 2023-09-11 DIAGNOSIS — M869 Osteomyelitis, unspecified: Secondary | ICD-10-CM | POA: Diagnosis not present

## 2023-09-11 DIAGNOSIS — I959 Hypotension, unspecified: Secondary | ICD-10-CM

## 2023-09-11 NOTE — Assessment & Plan Note (Signed)
 Check labs

## 2023-09-12 DIAGNOSIS — A4102 Sepsis due to Methicillin resistant Staphylococcus aureus: Secondary | ICD-10-CM | POA: Insufficient documentation

## 2023-09-12 DIAGNOSIS — S20212A Contusion of left front wall of thorax, initial encounter: Secondary | ICD-10-CM | POA: Insufficient documentation

## 2023-09-12 DIAGNOSIS — M869 Osteomyelitis, unspecified: Secondary | ICD-10-CM | POA: Diagnosis not present

## 2023-09-12 HISTORY — DX: Sepsis due to methicillin resistant Staphylococcus aureus: A41.02

## 2023-09-12 NOTE — Assessment & Plan Note (Signed)
 Current treatment with Daptomycin via PICC line. Home health is assisting with medication administration. -Continue Daptomycin until September 21, 2023. -Ensure home health is assisting with medication administration.

## 2023-09-12 NOTE — Assessment & Plan Note (Signed)
 Status post partial toe amputation due to infection. Currently on Ciprofloxacin for the amputated toe. -Continue Ciprofloxacin as prescribed. -Follow up with Dr. Lequita Halt in 2 weeks.

## 2023-09-12 NOTE — Assessment & Plan Note (Signed)
 Status post surgical intervention. Wound care ongoing at home. -Continue daily wound care at home. -Follow up with Dr. Lequita Halt in 2 weeks.

## 2023-09-12 NOTE — Assessment & Plan Note (Signed)
 Check level

## 2023-09-13 ENCOUNTER — Telehealth: Payer: Self-pay | Admitting: Family Medicine

## 2023-09-13 ENCOUNTER — Telehealth: Payer: Self-pay

## 2023-09-13 DIAGNOSIS — B965 Pseudomonas (aeruginosa) (mallei) (pseudomallei) as the cause of diseases classified elsewhere: Secondary | ICD-10-CM | POA: Diagnosis not present

## 2023-09-13 DIAGNOSIS — E876 Hypokalemia: Secondary | ICD-10-CM | POA: Diagnosis not present

## 2023-09-13 DIAGNOSIS — K7030 Alcoholic cirrhosis of liver without ascites: Secondary | ICD-10-CM | POA: Diagnosis not present

## 2023-09-13 DIAGNOSIS — E11649 Type 2 diabetes mellitus with hypoglycemia without coma: Secondary | ICD-10-CM | POA: Diagnosis not present

## 2023-09-13 DIAGNOSIS — K766 Portal hypertension: Secondary | ICD-10-CM | POA: Diagnosis not present

## 2023-09-13 DIAGNOSIS — L03116 Cellulitis of left lower limb: Secondary | ICD-10-CM | POA: Diagnosis not present

## 2023-09-13 DIAGNOSIS — R269 Unspecified abnormalities of gait and mobility: Secondary | ICD-10-CM | POA: Diagnosis not present

## 2023-09-13 DIAGNOSIS — M86172 Other acute osteomyelitis, left ankle and foot: Secondary | ICD-10-CM | POA: Diagnosis not present

## 2023-09-13 DIAGNOSIS — N390 Urinary tract infection, site not specified: Secondary | ICD-10-CM | POA: Diagnosis not present

## 2023-09-13 DIAGNOSIS — E871 Hypo-osmolality and hyponatremia: Secondary | ICD-10-CM | POA: Diagnosis not present

## 2023-09-13 DIAGNOSIS — M869 Osteomyelitis, unspecified: Secondary | ICD-10-CM | POA: Diagnosis not present

## 2023-09-13 DIAGNOSIS — G43909 Migraine, unspecified, not intractable, without status migrainosus: Secondary | ICD-10-CM | POA: Diagnosis not present

## 2023-09-13 DIAGNOSIS — N1832 Chronic kidney disease, stage 3b: Secondary | ICD-10-CM | POA: Diagnosis not present

## 2023-09-13 DIAGNOSIS — I959 Hypotension, unspecified: Secondary | ICD-10-CM | POA: Diagnosis not present

## 2023-09-13 DIAGNOSIS — E1122 Type 2 diabetes mellitus with diabetic chronic kidney disease: Secondary | ICD-10-CM | POA: Diagnosis not present

## 2023-09-13 DIAGNOSIS — E1169 Type 2 diabetes mellitus with other specified complication: Secondary | ICD-10-CM | POA: Diagnosis not present

## 2023-09-13 DIAGNOSIS — N179 Acute kidney failure, unspecified: Secondary | ICD-10-CM | POA: Diagnosis not present

## 2023-09-13 DIAGNOSIS — D631 Anemia in chronic kidney disease: Secondary | ICD-10-CM | POA: Diagnosis not present

## 2023-09-13 DIAGNOSIS — D72829 Elevated white blood cell count, unspecified: Secondary | ICD-10-CM | POA: Diagnosis not present

## 2023-09-13 DIAGNOSIS — U071 COVID-19: Secondary | ICD-10-CM | POA: Diagnosis not present

## 2023-09-13 DIAGNOSIS — E892 Postprocedural hypoparathyroidism: Secondary | ICD-10-CM | POA: Diagnosis not present

## 2023-09-13 DIAGNOSIS — F1021 Alcohol dependence, in remission: Secondary | ICD-10-CM | POA: Diagnosis not present

## 2023-09-13 DIAGNOSIS — R911 Solitary pulmonary nodule: Secondary | ICD-10-CM | POA: Diagnosis not present

## 2023-09-13 DIAGNOSIS — A4102 Sepsis due to Methicillin resistant Staphylococcus aureus: Secondary | ICD-10-CM | POA: Diagnosis not present

## 2023-09-13 DIAGNOSIS — Z4781 Encounter for orthopedic aftercare following surgical amputation: Secondary | ICD-10-CM | POA: Diagnosis not present

## 2023-09-13 NOTE — Telephone Encounter (Signed)
 [facility] - ORDER [phone]

## 2023-09-13 NOTE — Telephone Encounter (Signed)
 Spoke with Yvonna Alanis who was verifying if orders below had be received and signed. I do not see the orders for Dr. Sedalia Muta sign and she is re-faxing the orders.  Copied from CRM [id number]. Topic: Clinical - Home Health Verbal Orders >> Sep 13, 2023 11:28 AM Eunice Blase wrote: Caller/Agency: [facility] per Asa Lente Number: [phone] ext. [phone] Service Requested: PT Frequency: 1week 6 for nursing, Pt 1 week 1 and 2 week 3, 1 week 2.  Any new concerns about the patient? Yes

## 2023-09-14 ENCOUNTER — Telehealth: Payer: Self-pay | Admitting: Family Medicine

## 2023-09-14 NOTE — Telephone Encounter (Signed)
 [facility]- ORDER [phone]

## 2023-09-16 DIAGNOSIS — B9562 Methicillin resistant Staphylococcus aureus infection as the cause of diseases classified elsewhere: Secondary | ICD-10-CM | POA: Diagnosis not present

## 2023-09-16 DIAGNOSIS — Z89412 Acquired absence of left great toe: Secondary | ICD-10-CM | POA: Diagnosis not present

## 2023-09-16 DIAGNOSIS — M868X7 Other osteomyelitis, ankle and foot: Secondary | ICD-10-CM | POA: Diagnosis not present

## 2023-09-16 DIAGNOSIS — S20212S Contusion of left front wall of thorax, sequela: Secondary | ICD-10-CM | POA: Diagnosis not present

## 2023-09-16 DIAGNOSIS — R7881 Bacteremia: Secondary | ICD-10-CM | POA: Diagnosis not present

## 2023-09-17 DIAGNOSIS — E876 Hypokalemia: Secondary | ICD-10-CM | POA: Diagnosis not present

## 2023-09-17 DIAGNOSIS — N1832 Chronic kidney disease, stage 3b: Secondary | ICD-10-CM | POA: Diagnosis not present

## 2023-09-17 DIAGNOSIS — K766 Portal hypertension: Secondary | ICD-10-CM | POA: Diagnosis not present

## 2023-09-17 DIAGNOSIS — G43909 Migraine, unspecified, not intractable, without status migrainosus: Secondary | ICD-10-CM | POA: Diagnosis not present

## 2023-09-17 DIAGNOSIS — N179 Acute kidney failure, unspecified: Secondary | ICD-10-CM | POA: Diagnosis not present

## 2023-09-17 DIAGNOSIS — E1122 Type 2 diabetes mellitus with diabetic chronic kidney disease: Secondary | ICD-10-CM | POA: Diagnosis not present

## 2023-09-17 DIAGNOSIS — E1169 Type 2 diabetes mellitus with other specified complication: Secondary | ICD-10-CM | POA: Diagnosis not present

## 2023-09-17 DIAGNOSIS — F0394 Unspecified dementia, unspecified severity, with anxiety: Secondary | ICD-10-CM | POA: Diagnosis not present

## 2023-09-17 DIAGNOSIS — F0393 Unspecified dementia, unspecified severity, with mood disturbance: Secondary | ICD-10-CM | POA: Diagnosis not present

## 2023-09-17 DIAGNOSIS — F411 Generalized anxiety disorder: Secondary | ICD-10-CM | POA: Diagnosis not present

## 2023-09-17 DIAGNOSIS — I700 Atherosclerosis of aorta: Secondary | ICD-10-CM | POA: Diagnosis not present

## 2023-09-17 DIAGNOSIS — K7030 Alcoholic cirrhosis of liver without ascites: Secondary | ICD-10-CM | POA: Diagnosis not present

## 2023-09-17 DIAGNOSIS — M869 Osteomyelitis, unspecified: Secondary | ICD-10-CM | POA: Diagnosis not present

## 2023-09-17 DIAGNOSIS — I4891 Unspecified atrial fibrillation: Secondary | ICD-10-CM | POA: Diagnosis not present

## 2023-09-17 DIAGNOSIS — F319 Bipolar disorder, unspecified: Secondary | ICD-10-CM | POA: Diagnosis not present

## 2023-09-17 DIAGNOSIS — D631 Anemia in chronic kidney disease: Secondary | ICD-10-CM | POA: Diagnosis not present

## 2023-09-17 DIAGNOSIS — E890 Postprocedural hypothyroidism: Secondary | ICD-10-CM | POA: Diagnosis not present

## 2023-09-17 DIAGNOSIS — I051 Rheumatic mitral insufficiency: Secondary | ICD-10-CM | POA: Diagnosis not present

## 2023-09-17 DIAGNOSIS — E892 Postprocedural hypoparathyroidism: Secondary | ICD-10-CM | POA: Diagnosis not present

## 2023-09-17 DIAGNOSIS — G621 Alcoholic polyneuropathy: Secondary | ICD-10-CM | POA: Diagnosis not present

## 2023-09-17 DIAGNOSIS — I2510 Atherosclerotic heart disease of native coronary artery without angina pectoris: Secondary | ICD-10-CM | POA: Diagnosis not present

## 2023-09-17 DIAGNOSIS — E871 Hypo-osmolality and hyponatremia: Secondary | ICD-10-CM | POA: Diagnosis not present

## 2023-09-17 DIAGNOSIS — R911 Solitary pulmonary nodule: Secondary | ICD-10-CM | POA: Diagnosis not present

## 2023-09-17 DIAGNOSIS — Z452 Encounter for adjustment and management of vascular access device: Secondary | ICD-10-CM | POA: Diagnosis not present

## 2023-09-17 DIAGNOSIS — A4102 Sepsis due to Methicillin resistant Staphylococcus aureus: Secondary | ICD-10-CM | POA: Diagnosis not present

## 2023-09-20 DIAGNOSIS — I4891 Unspecified atrial fibrillation: Secondary | ICD-10-CM | POA: Diagnosis not present

## 2023-09-20 DIAGNOSIS — F411 Generalized anxiety disorder: Secondary | ICD-10-CM | POA: Diagnosis not present

## 2023-09-20 DIAGNOSIS — N179 Acute kidney failure, unspecified: Secondary | ICD-10-CM | POA: Diagnosis not present

## 2023-09-20 DIAGNOSIS — K7030 Alcoholic cirrhosis of liver without ascites: Secondary | ICD-10-CM | POA: Diagnosis not present

## 2023-09-20 DIAGNOSIS — I2510 Atherosclerotic heart disease of native coronary artery without angina pectoris: Secondary | ICD-10-CM | POA: Diagnosis not present

## 2023-09-20 DIAGNOSIS — E1122 Type 2 diabetes mellitus with diabetic chronic kidney disease: Secondary | ICD-10-CM | POA: Diagnosis not present

## 2023-09-20 DIAGNOSIS — I051 Rheumatic mitral insufficiency: Secondary | ICD-10-CM | POA: Diagnosis not present

## 2023-09-20 DIAGNOSIS — E871 Hypo-osmolality and hyponatremia: Secondary | ICD-10-CM | POA: Diagnosis not present

## 2023-09-20 DIAGNOSIS — I700 Atherosclerosis of aorta: Secondary | ICD-10-CM | POA: Diagnosis not present

## 2023-09-20 DIAGNOSIS — F0394 Unspecified dementia, unspecified severity, with anxiety: Secondary | ICD-10-CM | POA: Diagnosis not present

## 2023-09-20 DIAGNOSIS — M869 Osteomyelitis, unspecified: Secondary | ICD-10-CM | POA: Diagnosis not present

## 2023-09-20 DIAGNOSIS — E876 Hypokalemia: Secondary | ICD-10-CM | POA: Diagnosis not present

## 2023-09-20 DIAGNOSIS — G621 Alcoholic polyneuropathy: Secondary | ICD-10-CM | POA: Diagnosis not present

## 2023-09-20 DIAGNOSIS — G43909 Migraine, unspecified, not intractable, without status migrainosus: Secondary | ICD-10-CM | POA: Diagnosis not present

## 2023-09-20 DIAGNOSIS — M8640 Chronic osteomyelitis with draining sinus, unspecified site: Secondary | ICD-10-CM | POA: Diagnosis not present

## 2023-09-20 DIAGNOSIS — F319 Bipolar disorder, unspecified: Secondary | ICD-10-CM | POA: Diagnosis not present

## 2023-09-20 DIAGNOSIS — E890 Postprocedural hypothyroidism: Secondary | ICD-10-CM | POA: Diagnosis not present

## 2023-09-20 DIAGNOSIS — F0393 Unspecified dementia, unspecified severity, with mood disturbance: Secondary | ICD-10-CM | POA: Diagnosis not present

## 2023-09-20 DIAGNOSIS — Z452 Encounter for adjustment and management of vascular access device: Secondary | ICD-10-CM | POA: Diagnosis not present

## 2023-09-20 DIAGNOSIS — D631 Anemia in chronic kidney disease: Secondary | ICD-10-CM | POA: Diagnosis not present

## 2023-09-20 DIAGNOSIS — K766 Portal hypertension: Secondary | ICD-10-CM | POA: Diagnosis not present

## 2023-09-20 DIAGNOSIS — E892 Postprocedural hypoparathyroidism: Secondary | ICD-10-CM | POA: Diagnosis not present

## 2023-09-20 DIAGNOSIS — R911 Solitary pulmonary nodule: Secondary | ICD-10-CM | POA: Diagnosis not present

## 2023-09-20 DIAGNOSIS — N1832 Chronic kidney disease, stage 3b: Secondary | ICD-10-CM | POA: Diagnosis not present

## 2023-09-20 DIAGNOSIS — A4102 Sepsis due to Methicillin resistant Staphylococcus aureus: Secondary | ICD-10-CM | POA: Diagnosis not present

## 2023-09-20 DIAGNOSIS — E1169 Type 2 diabetes mellitus with other specified complication: Secondary | ICD-10-CM | POA: Diagnosis not present

## 2023-09-21 ENCOUNTER — Other Ambulatory Visit: Payer: Self-pay | Admitting: Family Medicine

## 2023-09-21 ENCOUNTER — Telehealth: Payer: Self-pay

## 2023-09-21 DIAGNOSIS — G43909 Migraine, unspecified, not intractable, without status migrainosus: Secondary | ICD-10-CM | POA: Diagnosis not present

## 2023-09-21 DIAGNOSIS — R911 Solitary pulmonary nodule: Secondary | ICD-10-CM | POA: Diagnosis not present

## 2023-09-21 DIAGNOSIS — F0394 Unspecified dementia, unspecified severity, with anxiety: Secondary | ICD-10-CM | POA: Diagnosis not present

## 2023-09-21 DIAGNOSIS — I051 Rheumatic mitral insufficiency: Secondary | ICD-10-CM | POA: Diagnosis not present

## 2023-09-21 DIAGNOSIS — G621 Alcoholic polyneuropathy: Secondary | ICD-10-CM | POA: Diagnosis not present

## 2023-09-21 DIAGNOSIS — K7030 Alcoholic cirrhosis of liver without ascites: Secondary | ICD-10-CM | POA: Diagnosis not present

## 2023-09-21 DIAGNOSIS — K766 Portal hypertension: Secondary | ICD-10-CM | POA: Diagnosis not present

## 2023-09-21 DIAGNOSIS — E876 Hypokalemia: Secondary | ICD-10-CM | POA: Diagnosis not present

## 2023-09-21 DIAGNOSIS — A4102 Sepsis due to Methicillin resistant Staphylococcus aureus: Secondary | ICD-10-CM | POA: Diagnosis not present

## 2023-09-21 DIAGNOSIS — E1122 Type 2 diabetes mellitus with diabetic chronic kidney disease: Secondary | ICD-10-CM | POA: Diagnosis not present

## 2023-09-21 DIAGNOSIS — E871 Hypo-osmolality and hyponatremia: Secondary | ICD-10-CM | POA: Diagnosis not present

## 2023-09-21 DIAGNOSIS — F319 Bipolar disorder, unspecified: Secondary | ICD-10-CM | POA: Diagnosis not present

## 2023-09-21 DIAGNOSIS — I700 Atherosclerosis of aorta: Secondary | ICD-10-CM | POA: Diagnosis not present

## 2023-09-21 DIAGNOSIS — M869 Osteomyelitis, unspecified: Secondary | ICD-10-CM | POA: Diagnosis not present

## 2023-09-21 DIAGNOSIS — E890 Postprocedural hypothyroidism: Secondary | ICD-10-CM | POA: Diagnosis not present

## 2023-09-21 DIAGNOSIS — D631 Anemia in chronic kidney disease: Secondary | ICD-10-CM | POA: Diagnosis not present

## 2023-09-21 DIAGNOSIS — N179 Acute kidney failure, unspecified: Secondary | ICD-10-CM | POA: Diagnosis not present

## 2023-09-21 DIAGNOSIS — I4891 Unspecified atrial fibrillation: Secondary | ICD-10-CM | POA: Diagnosis not present

## 2023-09-21 DIAGNOSIS — Z452 Encounter for adjustment and management of vascular access device: Secondary | ICD-10-CM | POA: Diagnosis not present

## 2023-09-21 DIAGNOSIS — N1832 Chronic kidney disease, stage 3b: Secondary | ICD-10-CM | POA: Diagnosis not present

## 2023-09-21 DIAGNOSIS — F0393 Unspecified dementia, unspecified severity, with mood disturbance: Secondary | ICD-10-CM | POA: Diagnosis not present

## 2023-09-21 DIAGNOSIS — F411 Generalized anxiety disorder: Secondary | ICD-10-CM | POA: Diagnosis not present

## 2023-09-21 DIAGNOSIS — I2510 Atherosclerotic heart disease of native coronary artery without angina pectoris: Secondary | ICD-10-CM | POA: Diagnosis not present

## 2023-09-21 DIAGNOSIS — E1169 Type 2 diabetes mellitus with other specified complication: Secondary | ICD-10-CM | POA: Diagnosis not present

## 2023-09-21 DIAGNOSIS — E892 Postprocedural hypoparathyroidism: Secondary | ICD-10-CM | POA: Diagnosis not present

## 2023-09-21 NOTE — Telephone Encounter (Signed)
 Patient has been scheduled. Aware of appt date and time.

## 2023-09-21 NOTE — Telephone Encounter (Signed)
 Pt states she missed last appt, because she was in hospital. She would like to R/S those appt's.

## 2023-09-22 DIAGNOSIS — Z452 Encounter for adjustment and management of vascular access device: Secondary | ICD-10-CM | POA: Diagnosis not present

## 2023-09-22 DIAGNOSIS — I700 Atherosclerosis of aorta: Secondary | ICD-10-CM | POA: Diagnosis not present

## 2023-09-22 DIAGNOSIS — E1122 Type 2 diabetes mellitus with diabetic chronic kidney disease: Secondary | ICD-10-CM | POA: Diagnosis not present

## 2023-09-22 DIAGNOSIS — D631 Anemia in chronic kidney disease: Secondary | ICD-10-CM | POA: Diagnosis not present

## 2023-09-22 DIAGNOSIS — I2510 Atherosclerotic heart disease of native coronary artery without angina pectoris: Secondary | ICD-10-CM | POA: Diagnosis not present

## 2023-09-22 DIAGNOSIS — F0394 Unspecified dementia, unspecified severity, with anxiety: Secondary | ICD-10-CM | POA: Diagnosis not present

## 2023-09-22 DIAGNOSIS — F319 Bipolar disorder, unspecified: Secondary | ICD-10-CM | POA: Diagnosis not present

## 2023-09-22 DIAGNOSIS — I4891 Unspecified atrial fibrillation: Secondary | ICD-10-CM | POA: Diagnosis not present

## 2023-09-22 DIAGNOSIS — N1832 Chronic kidney disease, stage 3b: Secondary | ICD-10-CM | POA: Diagnosis not present

## 2023-09-22 DIAGNOSIS — K7030 Alcoholic cirrhosis of liver without ascites: Secondary | ICD-10-CM | POA: Diagnosis not present

## 2023-09-22 DIAGNOSIS — G43909 Migraine, unspecified, not intractable, without status migrainosus: Secondary | ICD-10-CM | POA: Diagnosis not present

## 2023-09-22 DIAGNOSIS — I051 Rheumatic mitral insufficiency: Secondary | ICD-10-CM | POA: Diagnosis not present

## 2023-09-22 DIAGNOSIS — F411 Generalized anxiety disorder: Secondary | ICD-10-CM | POA: Diagnosis not present

## 2023-09-22 DIAGNOSIS — E890 Postprocedural hypothyroidism: Secondary | ICD-10-CM | POA: Diagnosis not present

## 2023-09-22 DIAGNOSIS — E876 Hypokalemia: Secondary | ICD-10-CM | POA: Diagnosis not present

## 2023-09-22 DIAGNOSIS — E1169 Type 2 diabetes mellitus with other specified complication: Secondary | ICD-10-CM | POA: Diagnosis not present

## 2023-09-22 DIAGNOSIS — M869 Osteomyelitis, unspecified: Secondary | ICD-10-CM | POA: Diagnosis not present

## 2023-09-22 DIAGNOSIS — N179 Acute kidney failure, unspecified: Secondary | ICD-10-CM | POA: Diagnosis not present

## 2023-09-22 DIAGNOSIS — G621 Alcoholic polyneuropathy: Secondary | ICD-10-CM | POA: Diagnosis not present

## 2023-09-22 DIAGNOSIS — R911 Solitary pulmonary nodule: Secondary | ICD-10-CM | POA: Diagnosis not present

## 2023-09-22 DIAGNOSIS — A4102 Sepsis due to Methicillin resistant Staphylococcus aureus: Secondary | ICD-10-CM | POA: Diagnosis not present

## 2023-09-22 DIAGNOSIS — R7881 Bacteremia: Secondary | ICD-10-CM | POA: Diagnosis not present

## 2023-09-22 DIAGNOSIS — E892 Postprocedural hypoparathyroidism: Secondary | ICD-10-CM | POA: Diagnosis not present

## 2023-09-22 DIAGNOSIS — K766 Portal hypertension: Secondary | ICD-10-CM | POA: Diagnosis not present

## 2023-09-22 DIAGNOSIS — B9562 Methicillin resistant Staphylococcus aureus infection as the cause of diseases classified elsewhere: Secondary | ICD-10-CM | POA: Diagnosis not present

## 2023-09-22 DIAGNOSIS — E871 Hypo-osmolality and hyponatremia: Secondary | ICD-10-CM | POA: Diagnosis not present

## 2023-09-22 DIAGNOSIS — F0393 Unspecified dementia, unspecified severity, with mood disturbance: Secondary | ICD-10-CM | POA: Diagnosis not present

## 2023-09-23 ENCOUNTER — Other Ambulatory Visit: Payer: Self-pay

## 2023-09-23 DIAGNOSIS — F0394 Unspecified dementia, unspecified severity, with anxiety: Secondary | ICD-10-CM | POA: Diagnosis not present

## 2023-09-23 DIAGNOSIS — F411 Generalized anxiety disorder: Secondary | ICD-10-CM | POA: Diagnosis not present

## 2023-09-23 DIAGNOSIS — E890 Postprocedural hypothyroidism: Secondary | ICD-10-CM | POA: Diagnosis not present

## 2023-09-23 DIAGNOSIS — F0393 Unspecified dementia, unspecified severity, with mood disturbance: Secondary | ICD-10-CM | POA: Diagnosis not present

## 2023-09-23 DIAGNOSIS — E871 Hypo-osmolality and hyponatremia: Secondary | ICD-10-CM | POA: Diagnosis not present

## 2023-09-23 DIAGNOSIS — N179 Acute kidney failure, unspecified: Secondary | ICD-10-CM | POA: Diagnosis not present

## 2023-09-23 DIAGNOSIS — D508 Other iron deficiency anemias: Secondary | ICD-10-CM

## 2023-09-23 DIAGNOSIS — I4891 Unspecified atrial fibrillation: Secondary | ICD-10-CM | POA: Diagnosis not present

## 2023-09-23 DIAGNOSIS — N1832 Chronic kidney disease, stage 3b: Secondary | ICD-10-CM | POA: Diagnosis not present

## 2023-09-23 DIAGNOSIS — E876 Hypokalemia: Secondary | ICD-10-CM | POA: Diagnosis not present

## 2023-09-23 DIAGNOSIS — E1169 Type 2 diabetes mellitus with other specified complication: Secondary | ICD-10-CM | POA: Diagnosis not present

## 2023-09-23 DIAGNOSIS — E1122 Type 2 diabetes mellitus with diabetic chronic kidney disease: Secondary | ICD-10-CM | POA: Diagnosis not present

## 2023-09-23 DIAGNOSIS — G621 Alcoholic polyneuropathy: Secondary | ICD-10-CM | POA: Diagnosis not present

## 2023-09-23 DIAGNOSIS — I700 Atherosclerosis of aorta: Secondary | ICD-10-CM | POA: Diagnosis not present

## 2023-09-23 DIAGNOSIS — I2510 Atherosclerotic heart disease of native coronary artery without angina pectoris: Secondary | ICD-10-CM | POA: Diagnosis not present

## 2023-09-23 DIAGNOSIS — R911 Solitary pulmonary nodule: Secondary | ICD-10-CM | POA: Diagnosis not present

## 2023-09-23 DIAGNOSIS — I051 Rheumatic mitral insufficiency: Secondary | ICD-10-CM | POA: Diagnosis not present

## 2023-09-23 DIAGNOSIS — E892 Postprocedural hypoparathyroidism: Secondary | ICD-10-CM | POA: Diagnosis not present

## 2023-09-23 DIAGNOSIS — K7030 Alcoholic cirrhosis of liver without ascites: Secondary | ICD-10-CM | POA: Diagnosis not present

## 2023-09-23 DIAGNOSIS — F319 Bipolar disorder, unspecified: Secondary | ICD-10-CM | POA: Diagnosis not present

## 2023-09-23 DIAGNOSIS — K766 Portal hypertension: Secondary | ICD-10-CM | POA: Diagnosis not present

## 2023-09-23 DIAGNOSIS — Z452 Encounter for adjustment and management of vascular access device: Secondary | ICD-10-CM | POA: Diagnosis not present

## 2023-09-23 DIAGNOSIS — M869 Osteomyelitis, unspecified: Secondary | ICD-10-CM | POA: Diagnosis not present

## 2023-09-23 DIAGNOSIS — D631 Anemia in chronic kidney disease: Secondary | ICD-10-CM | POA: Diagnosis not present

## 2023-09-23 DIAGNOSIS — G43909 Migraine, unspecified, not intractable, without status migrainosus: Secondary | ICD-10-CM | POA: Diagnosis not present

## 2023-09-23 DIAGNOSIS — A4102 Sepsis due to Methicillin resistant Staphylococcus aureus: Secondary | ICD-10-CM | POA: Diagnosis not present

## 2023-09-23 NOTE — Progress Notes (Signed)
 [facility]  [address] [place],  [place]  [address] [phone]  Clinic Day:  09/24/2023  Referring physician: Blane Ohara, MD  HISTORY OF PRESENT ILLNESS:  The patient is a 58 y.o. female with a history of both leukocytosis and anemia.  A recent bone marrow biopsy did not show any obvious marrow etiology behind her abnormal counts.  She was receiving monthly Retacrit shots up until December 2024 to get her hemoglobin to/above 10.  She comes in today to reassess her anemia.  Overall, the patient claims to be doing okay.  Since her last visit, she has been hospitalized for multiple issues, including MRSA bacteremia, a kidney infection which led to mild renal insufficiency, and osteomyelitis, which led to partial amputation of her left big toe. As it pertains to her anemia, she does have occasional fatigue, but denies having any overt forms of blood loss.    PHYSICAL EXAM:  There were no vitals taken for this visit. Wt Readings from Last 3 Encounters:  09/07/23 153 lb (69.4 kg)  08/02/23 151 lb (68.5 kg)  07/23/23 154 lb (69.9 kg)   There is no height or weight on file to calculate BMI. Performance status (ECOG): 1 Physical Exam Constitutional:      Appearance: Normal appearance. She is ill-appearing (chronically ill appearance).     Comments: She ambulates with a walker  HENT:     Mouth/Throat:     Mouth: Mucous membranes are moist.     Pharynx: Oropharynx is clear. No oropharyngeal exudate or posterior oropharyngeal erythema.  Cardiovascular:     Rate and Rhythm: Normal rate and regular rhythm.     Heart sounds: No murmur heard.    No friction rub. No gallop.  Pulmonary:     Effort: Pulmonary effort is normal. No respiratory distress.     Breath sounds: No wheezing, rhonchi or rales.  Abdominal:     General: Bowel sounds are normal. There is no distension.     Palpations: Abdomen is soft. There is no mass.     Tenderness: There is no  abdominal tenderness.  Musculoskeletal:        General: No swelling.     Right lower leg: No edema.     Left lower leg: No edema.  Lymphadenopathy:     Cervical: No cervical adenopathy.     Upper Body:     Right upper body: No supraclavicular or axillary adenopathy.     Left upper body: No supraclavicular or axillary adenopathy.     Lower Body: No right inguinal adenopathy. No left inguinal adenopathy.  Skin:    General: Skin is warm.     Coloration: Skin is not jaundiced.     Findings: No lesion or rash.  Neurological:     General: No focal deficit present.     Mental Status: She is alert and oriented to person, place, and time. Mental status is at baseline.  Psychiatric:        Mood and Affect: Mood normal.        Behavior: Behavior normal.        Thought Content: Thought content normal.    LABS:    Latest Reference Range & Units 09/24/23 09:41  WBC 4.0 - 10.5 K/uL 12.2 (H)  RBC 3.87 - 5.11 MIL/uL 2.96 (L)  Hemoglobin 12.0 - 15.0 g/dL 8.9 (L)  HCT 84.6 - 96.2 % 27.3 (L)  MCV 80.0 - 100.0 fL 92.2  MCH 26.0 -  34.0 pg 30.1  MCHC 30.0 - 36.0 g/dL 16.1  RDW 09.6 - 04.5 % 16.0 (H)  Platelets 150 - 400 K/uL 490 (H)  nRBC 0.0 - 0.2 % 0 /100 WBC 0.00 0  Neutrophils % 78  Lymphocytes % 11  Monocytes Relative % 8  Eosinophil % 1  Basophil % 1  Immature Granulocytes % 1  (H): Data is abnormally high (L): Data is abnormally low  Latest Reference Range & Units 09/24/23 09:41  Iron 28 - 170 ug/dL 60  UIBC ug/dL 84  TIBC 409 - 811 ug/dL 914 (L)  Saturation Ratios 10.4 - 31.8 % 42 (H)  Ferritin 11 - 307 ng/mL 439 (H)  Folate >5.9 ng/mL 17.6  (L): Data is abnormally low (H): Data is abnormally high    Latest Reference Range & Units 05/13/23 10:06  Sodium 135 - 145 mmol/L 134 (L)  Potassium 3.5 - 5.1 mmol/L 3.5  Chloride 98 - 111 mmol/L 97 (L)  CO2 22 - 32 mmol/L 27  Glucose 70 - 99 mg/dL 782 (H)  BUN 6 - 20 mg/dL 30 (H)  Creatinine 9.56 - 1.00 mg/dL 2.13 (H)  Calcium  8.9 - 10.3 mg/dL 7.5 (L)  Anion gap 5 - 15  10  Alkaline Phosphatase 38 - 126 U/L 120  Albumin 3.5 - 5.0 g/dL 2.9 (L)  AST 15 - 41 U/L 31  ALT 0 - 44 U/L 29  Total Protein 6.5 - 8.1 g/dL 6.3 (L)  Total Bilirubin 0.3 - 1.2 mg/dL 0.7  GFR, Est Non African American >60 mL/min >60  Iron 28 - 170 ug/dL 59  UIBC ug/dL 086  TIBC 578 - 469 ug/dL 629  Saturation Ratios 10.4 - 31.8 % 21  Ferritin 11 - 307 ng/mL 263  Folate >5.9 ng/mL 20.7  Vitamin B12 180 - 914 pg/mL 295  (L): Data is abnormally low (H): Data is abnormally high  ASSESSMENT & PLAN:  Assessment/Plan:  A 58 y.o. female with both leukocytosis and anemia secondary to chronic renal insufficiency.  As her hemoglobin remains below 10, she will get back to taking monthly Retacrit injections at 40,000 units.  Her CBC will be checked monthly to see how well she is responding to her Retacrit shots.  I will see her back in 4 months for repeat clinical assessment.  The patient understands all the plans discussed today and is in agreement with them.    Tenise Stetler Kirby Funk, MD

## 2023-09-24 ENCOUNTER — Other Ambulatory Visit: Payer: Self-pay | Admitting: Oncology

## 2023-09-24 ENCOUNTER — Inpatient Hospital Stay: Admitting: Oncology

## 2023-09-24 ENCOUNTER — Inpatient Hospital Stay: Attending: Oncology

## 2023-09-24 ENCOUNTER — Inpatient Hospital Stay

## 2023-09-24 VITALS — BP 131/81 | HR 85 | Temp 99.3°F | Resp 14 | Ht 66.0 in | Wt 150.1 lb

## 2023-09-24 DIAGNOSIS — R5383 Other fatigue: Secondary | ICD-10-CM | POA: Diagnosis not present

## 2023-09-24 DIAGNOSIS — Z79899 Other long term (current) drug therapy: Secondary | ICD-10-CM | POA: Diagnosis not present

## 2023-09-24 DIAGNOSIS — D508 Other iron deficiency anemias: Secondary | ICD-10-CM

## 2023-09-24 DIAGNOSIS — Z8614 Personal history of Methicillin resistant Staphylococcus aureus infection: Secondary | ICD-10-CM | POA: Diagnosis not present

## 2023-09-24 DIAGNOSIS — D631 Anemia in chronic kidney disease: Secondary | ICD-10-CM | POA: Insufficient documentation

## 2023-09-24 DIAGNOSIS — D72829 Elevated white blood cell count, unspecified: Secondary | ICD-10-CM | POA: Insufficient documentation

## 2023-09-24 DIAGNOSIS — N189 Chronic kidney disease, unspecified: Secondary | ICD-10-CM | POA: Diagnosis not present

## 2023-09-24 DIAGNOSIS — N1832 Chronic kidney disease, stage 3b: Secondary | ICD-10-CM | POA: Insufficient documentation

## 2023-09-24 LAB — CBC WITH DIFFERENTIAL (CANCER CENTER ONLY)
Abs Immature Granulocytes: 0.15 10*3/uL — ABNORMAL HIGH (ref 0.00–0.07)
Basophils Absolute: 0.1 10*3/uL (ref 0.0–0.1)
Basophils Relative: 1 %
Eosinophils Absolute: 0.1 10*3/uL (ref 0.0–0.5)
Eosinophils Relative: 1 %
HCT: 27.3 % — ABNORMAL LOW (ref 36.0–46.0)
Hemoglobin: 8.9 g/dL — ABNORMAL LOW (ref 12.0–15.0)
Immature Granulocytes: 1 %
Lymphocytes Relative: 11 %
Lymphs Abs: 1.4 10*3/uL (ref 0.7–4.0)
MCH: 30.1 pg (ref 26.0–34.0)
MCHC: 32.6 g/dL (ref 30.0–36.0)
MCV: 92.2 fL (ref 80.0–100.0)
Monocytes Absolute: 0.9 10*3/uL (ref 0.1–1.0)
Monocytes Relative: 8 %
Neutro Abs: 9.6 10*3/uL — ABNORMAL HIGH (ref 1.7–7.7)
Neutrophils Relative %: 78 %
Platelet Count: 490 10*3/uL — ABNORMAL HIGH (ref 150–400)
RBC: 2.96 MIL/uL — ABNORMAL LOW (ref 3.87–5.11)
RDW: 16 % — ABNORMAL HIGH (ref 11.5–15.5)
WBC Count: 12.2 10*3/uL — ABNORMAL HIGH (ref 4.0–10.5)
nRBC: 0 % (ref 0.0–0.2)
nRBC: 0 /100{WBCs}

## 2023-09-24 LAB — VITAMIN B12: Vitamin B-12: 3640 pg/mL — ABNORMAL HIGH (ref 180–914)

## 2023-09-24 LAB — IRON AND TIBC
Iron: 60 ug/dL (ref 28–170)
Saturation Ratios: 42 % — ABNORMAL HIGH (ref 10.4–31.8)
TIBC: 144 ug/dL — ABNORMAL LOW (ref 250–450)
UIBC: 84 ug/dL

## 2023-09-24 LAB — FERRITIN: Ferritin: 439 ng/mL — ABNORMAL HIGH (ref 11–307)

## 2023-09-24 LAB — FOLATE: Folate: 17.6 ng/mL (ref >5.9)

## 2023-09-25 ENCOUNTER — Other Ambulatory Visit: Payer: Self-pay | Admitting: Family Medicine

## 2023-09-27 ENCOUNTER — Telehealth: Payer: Self-pay | Admitting: Oncology

## 2023-09-27 DIAGNOSIS — Z452 Encounter for adjustment and management of vascular access device: Secondary | ICD-10-CM | POA: Diagnosis not present

## 2023-09-27 DIAGNOSIS — R911 Solitary pulmonary nodule: Secondary | ICD-10-CM | POA: Diagnosis not present

## 2023-09-27 DIAGNOSIS — K766 Portal hypertension: Secondary | ICD-10-CM | POA: Diagnosis not present

## 2023-09-27 DIAGNOSIS — F411 Generalized anxiety disorder: Secondary | ICD-10-CM | POA: Diagnosis not present

## 2023-09-27 DIAGNOSIS — K7030 Alcoholic cirrhosis of liver without ascites: Secondary | ICD-10-CM | POA: Diagnosis not present

## 2023-09-27 DIAGNOSIS — E892 Postprocedural hypoparathyroidism: Secondary | ICD-10-CM | POA: Diagnosis not present

## 2023-09-27 DIAGNOSIS — E890 Postprocedural hypothyroidism: Secondary | ICD-10-CM | POA: Diagnosis not present

## 2023-09-27 DIAGNOSIS — N1832 Chronic kidney disease, stage 3b: Secondary | ICD-10-CM | POA: Diagnosis not present

## 2023-09-27 DIAGNOSIS — I2510 Atherosclerotic heart disease of native coronary artery without angina pectoris: Secondary | ICD-10-CM | POA: Diagnosis not present

## 2023-09-27 DIAGNOSIS — E1169 Type 2 diabetes mellitus with other specified complication: Secondary | ICD-10-CM | POA: Diagnosis not present

## 2023-09-27 DIAGNOSIS — I700 Atherosclerosis of aorta: Secondary | ICD-10-CM | POA: Diagnosis not present

## 2023-09-27 DIAGNOSIS — I051 Rheumatic mitral insufficiency: Secondary | ICD-10-CM | POA: Diagnosis not present

## 2023-09-27 DIAGNOSIS — E1122 Type 2 diabetes mellitus with diabetic chronic kidney disease: Secondary | ICD-10-CM | POA: Diagnosis not present

## 2023-09-27 DIAGNOSIS — F0393 Unspecified dementia, unspecified severity, with mood disturbance: Secondary | ICD-10-CM | POA: Diagnosis not present

## 2023-09-27 DIAGNOSIS — G621 Alcoholic polyneuropathy: Secondary | ICD-10-CM | POA: Diagnosis not present

## 2023-09-27 DIAGNOSIS — M869 Osteomyelitis, unspecified: Secondary | ICD-10-CM | POA: Diagnosis not present

## 2023-09-27 DIAGNOSIS — D631 Anemia in chronic kidney disease: Secondary | ICD-10-CM | POA: Diagnosis not present

## 2023-09-27 DIAGNOSIS — F0394 Unspecified dementia, unspecified severity, with anxiety: Secondary | ICD-10-CM | POA: Diagnosis not present

## 2023-09-27 DIAGNOSIS — F319 Bipolar disorder, unspecified: Secondary | ICD-10-CM | POA: Diagnosis not present

## 2023-09-27 DIAGNOSIS — A4102 Sepsis due to Methicillin resistant Staphylococcus aureus: Secondary | ICD-10-CM | POA: Diagnosis not present

## 2023-09-27 DIAGNOSIS — N179 Acute kidney failure, unspecified: Secondary | ICD-10-CM | POA: Diagnosis not present

## 2023-09-27 DIAGNOSIS — E871 Hypo-osmolality and hyponatremia: Secondary | ICD-10-CM | POA: Diagnosis not present

## 2023-09-27 DIAGNOSIS — E876 Hypokalemia: Secondary | ICD-10-CM | POA: Diagnosis not present

## 2023-09-27 DIAGNOSIS — I4891 Unspecified atrial fibrillation: Secondary | ICD-10-CM | POA: Diagnosis not present

## 2023-09-27 DIAGNOSIS — G43909 Migraine, unspecified, not intractable, without status migrainosus: Secondary | ICD-10-CM | POA: Diagnosis not present

## 2023-09-27 NOTE — Telephone Encounter (Signed)
 09/27/23 Spoke with patient and confirmed injection appt on 09/28/23 at 2pm.

## 2023-09-28 ENCOUNTER — Encounter: Payer: Self-pay | Admitting: Oncology

## 2023-09-28 ENCOUNTER — Inpatient Hospital Stay

## 2023-09-28 VITALS — BP 114/79 | HR 85 | Temp 98.3°F | Resp 16 | Ht 66.0 in | Wt 147.2 lb

## 2023-09-28 DIAGNOSIS — M869 Osteomyelitis, unspecified: Secondary | ICD-10-CM | POA: Diagnosis not present

## 2023-09-28 DIAGNOSIS — G621 Alcoholic polyneuropathy: Secondary | ICD-10-CM | POA: Diagnosis not present

## 2023-09-28 DIAGNOSIS — Z79899 Other long term (current) drug therapy: Secondary | ICD-10-CM | POA: Diagnosis not present

## 2023-09-28 DIAGNOSIS — G43909 Migraine, unspecified, not intractable, without status migrainosus: Secondary | ICD-10-CM | POA: Diagnosis not present

## 2023-09-28 DIAGNOSIS — I4891 Unspecified atrial fibrillation: Secondary | ICD-10-CM | POA: Diagnosis not present

## 2023-09-28 DIAGNOSIS — E1122 Type 2 diabetes mellitus with diabetic chronic kidney disease: Secondary | ICD-10-CM | POA: Diagnosis not present

## 2023-09-28 DIAGNOSIS — N179 Acute kidney failure, unspecified: Secondary | ICD-10-CM | POA: Diagnosis not present

## 2023-09-28 DIAGNOSIS — R5383 Other fatigue: Secondary | ICD-10-CM | POA: Diagnosis not present

## 2023-09-28 DIAGNOSIS — D631 Anemia in chronic kidney disease: Secondary | ICD-10-CM | POA: Diagnosis not present

## 2023-09-28 DIAGNOSIS — E876 Hypokalemia: Secondary | ICD-10-CM | POA: Diagnosis not present

## 2023-09-28 DIAGNOSIS — F411 Generalized anxiety disorder: Secondary | ICD-10-CM | POA: Diagnosis not present

## 2023-09-28 DIAGNOSIS — K766 Portal hypertension: Secondary | ICD-10-CM | POA: Diagnosis not present

## 2023-09-28 DIAGNOSIS — R911 Solitary pulmonary nodule: Secondary | ICD-10-CM | POA: Diagnosis not present

## 2023-09-28 DIAGNOSIS — E871 Hypo-osmolality and hyponatremia: Secondary | ICD-10-CM | POA: Diagnosis not present

## 2023-09-28 DIAGNOSIS — F319 Bipolar disorder, unspecified: Secondary | ICD-10-CM | POA: Diagnosis not present

## 2023-09-28 DIAGNOSIS — F0393 Unspecified dementia, unspecified severity, with mood disturbance: Secondary | ICD-10-CM | POA: Diagnosis not present

## 2023-09-28 DIAGNOSIS — I051 Rheumatic mitral insufficiency: Secondary | ICD-10-CM | POA: Diagnosis not present

## 2023-09-28 DIAGNOSIS — E890 Postprocedural hypothyroidism: Secondary | ICD-10-CM | POA: Diagnosis not present

## 2023-09-28 DIAGNOSIS — F0394 Unspecified dementia, unspecified severity, with anxiety: Secondary | ICD-10-CM | POA: Diagnosis not present

## 2023-09-28 DIAGNOSIS — Z452 Encounter for adjustment and management of vascular access device: Secondary | ICD-10-CM | POA: Diagnosis not present

## 2023-09-28 DIAGNOSIS — I2510 Atherosclerotic heart disease of native coronary artery without angina pectoris: Secondary | ICD-10-CM | POA: Diagnosis not present

## 2023-09-28 DIAGNOSIS — E1169 Type 2 diabetes mellitus with other specified complication: Secondary | ICD-10-CM | POA: Diagnosis not present

## 2023-09-28 DIAGNOSIS — E892 Postprocedural hypoparathyroidism: Secondary | ICD-10-CM | POA: Diagnosis not present

## 2023-09-28 DIAGNOSIS — K7030 Alcoholic cirrhosis of liver without ascites: Secondary | ICD-10-CM | POA: Diagnosis not present

## 2023-09-28 DIAGNOSIS — N1832 Chronic kidney disease, stage 3b: Secondary | ICD-10-CM | POA: Diagnosis not present

## 2023-09-28 DIAGNOSIS — I700 Atherosclerosis of aorta: Secondary | ICD-10-CM | POA: Diagnosis not present

## 2023-09-28 DIAGNOSIS — Z8614 Personal history of Methicillin resistant Staphylococcus aureus infection: Secondary | ICD-10-CM | POA: Diagnosis not present

## 2023-09-28 DIAGNOSIS — A4102 Sepsis due to Methicillin resistant Staphylococcus aureus: Secondary | ICD-10-CM | POA: Diagnosis not present

## 2023-09-28 DIAGNOSIS — D72829 Elevated white blood cell count, unspecified: Secondary | ICD-10-CM | POA: Diagnosis not present

## 2023-09-28 MED ORDER — EPOETIN ALFA-EPBX 40000 UNIT/ML IJ SOLN
40000.0000 [IU] | Freq: Once | INTRAMUSCULAR | Status: AC
  Administered 2023-09-28: 40000 [IU] via SUBCUTANEOUS
  Filled 2023-09-28: qty 1

## 2023-09-28 NOTE — Patient Instructions (Signed)

## 2023-09-29 ENCOUNTER — Telehealth: Payer: Self-pay | Admitting: Oncology

## 2023-09-29 DIAGNOSIS — F319 Bipolar disorder, unspecified: Secondary | ICD-10-CM | POA: Diagnosis not present

## 2023-09-29 DIAGNOSIS — K766 Portal hypertension: Secondary | ICD-10-CM | POA: Diagnosis not present

## 2023-09-29 DIAGNOSIS — A4102 Sepsis due to Methicillin resistant Staphylococcus aureus: Secondary | ICD-10-CM | POA: Diagnosis not present

## 2023-09-29 DIAGNOSIS — E1169 Type 2 diabetes mellitus with other specified complication: Secondary | ICD-10-CM | POA: Diagnosis not present

## 2023-09-29 DIAGNOSIS — D631 Anemia in chronic kidney disease: Secondary | ICD-10-CM | POA: Diagnosis not present

## 2023-09-29 DIAGNOSIS — F0394 Unspecified dementia, unspecified severity, with anxiety: Secondary | ICD-10-CM | POA: Diagnosis not present

## 2023-09-29 DIAGNOSIS — I4891 Unspecified atrial fibrillation: Secondary | ICD-10-CM | POA: Diagnosis not present

## 2023-09-29 DIAGNOSIS — N179 Acute kidney failure, unspecified: Secondary | ICD-10-CM | POA: Diagnosis not present

## 2023-09-29 DIAGNOSIS — G621 Alcoholic polyneuropathy: Secondary | ICD-10-CM | POA: Diagnosis not present

## 2023-09-29 DIAGNOSIS — E876 Hypokalemia: Secondary | ICD-10-CM | POA: Diagnosis not present

## 2023-09-29 DIAGNOSIS — R911 Solitary pulmonary nodule: Secondary | ICD-10-CM | POA: Diagnosis not present

## 2023-09-29 DIAGNOSIS — E892 Postprocedural hypoparathyroidism: Secondary | ICD-10-CM | POA: Diagnosis not present

## 2023-09-29 DIAGNOSIS — I2510 Atherosclerotic heart disease of native coronary artery without angina pectoris: Secondary | ICD-10-CM | POA: Diagnosis not present

## 2023-09-29 DIAGNOSIS — K7030 Alcoholic cirrhosis of liver without ascites: Secondary | ICD-10-CM | POA: Diagnosis not present

## 2023-09-29 DIAGNOSIS — F411 Generalized anxiety disorder: Secondary | ICD-10-CM | POA: Diagnosis not present

## 2023-09-29 DIAGNOSIS — Z452 Encounter for adjustment and management of vascular access device: Secondary | ICD-10-CM | POA: Diagnosis not present

## 2023-09-29 DIAGNOSIS — E1122 Type 2 diabetes mellitus with diabetic chronic kidney disease: Secondary | ICD-10-CM | POA: Diagnosis not present

## 2023-09-29 DIAGNOSIS — N1832 Chronic kidney disease, stage 3b: Secondary | ICD-10-CM | POA: Diagnosis not present

## 2023-09-29 DIAGNOSIS — E871 Hypo-osmolality and hyponatremia: Secondary | ICD-10-CM | POA: Diagnosis not present

## 2023-09-29 DIAGNOSIS — I051 Rheumatic mitral insufficiency: Secondary | ICD-10-CM | POA: Diagnosis not present

## 2023-09-29 DIAGNOSIS — M869 Osteomyelitis, unspecified: Secondary | ICD-10-CM | POA: Diagnosis not present

## 2023-09-29 DIAGNOSIS — E890 Postprocedural hypothyroidism: Secondary | ICD-10-CM | POA: Diagnosis not present

## 2023-09-29 DIAGNOSIS — I700 Atherosclerosis of aorta: Secondary | ICD-10-CM | POA: Diagnosis not present

## 2023-09-29 DIAGNOSIS — G43909 Migraine, unspecified, not intractable, without status migrainosus: Secondary | ICD-10-CM | POA: Diagnosis not present

## 2023-09-29 DIAGNOSIS — F0393 Unspecified dementia, unspecified severity, with mood disturbance: Secondary | ICD-10-CM | POA: Diagnosis not present

## 2023-09-29 NOTE — Progress Notes (Unsigned)
Subjective:  Patient ID: Molly Parrish, female    DOB: 1965/10/20  Age: 58 y.o. MRN: [id number]  Chief Complaint  Patient presents with   Medical Management of Chronic Issues   Discussed the use of AI scribe software for clinical note transcription with the patient, who gave verbal consent to proceed.  History of Present Illness   The patient, with a history of anemia of chronic disease, liver cirrhosis, hypotension, and mouth ulcers, presents with ongoing constipation. Despite taking lactulose twice a day and Miralax once a day, the patient reports having a bowel movement only every two to three days. The patient also mentions that she has been losing weight despite eating regularly.  The patient's anemia is being managed with monthly Procrit injections. She recently had her first injection since being discharged from the hospital. The patient also reports that she has been experiencing shortness of breath, which she attributes to her anemia.  The patient's liver cirrhosis is being managed with lactulose and spironolactone. She was previously on Xifaxan, but it was discontinued due to insurance issues. The patient also has hypotension, for which she is taking midodrine.  The patient has mouth ulcers, which she manages with Kenalog paste. However, the ulcers persist and are triggered by certain foods. The patient also has a history of foot ulcers, which are being managed by a podiatrist.       Patient presents for follow up on constipation.Takes miralax once daily and lactulose 30 ml twice daily. Bms every 2-3 days.   Hypocalcemia:  Calcium carbonate 600 mg 6 oral twice daily.  Hypoglycemia: has a meter. Sugars 65-70. Uses glucose tablets. Had gastric bypass 2005. Vomits twice a day. Eating 6 small meals per day. A meal consists of 2 eggs for breakfast. Snacks: yogurt with protein. Protein and a salad at lunch. 2 veggies and a meat for supper.   Weight is unchanged. Has some  swelling in her hands and feet by the night.    Anemia: sees Dr. Rennis Harding.      05/07/2023   10:44 AM 03/01/2023    3:33 PM 06/15/2022   10:02 AM 12/05/2021   10:55 AM 11/14/2021   11:16 AM  Depression screen PHQ 2/9  Decreased Interest 0 0 0 0 0  Down, Depressed, Hopeless 0 0 0 0 0  PHQ - 2 Score 0 0 0 0 0  Altered sleeping  0 3    Tired, decreased energy  3 3    Change in appetite  0 0    Feeling bad or failure about yourself   0 0    Trouble concentrating  0 0    Moving slowly or fidgety/restless   1    Suicidal thoughts  0 0    PHQ-9 Score  3 7    Difficult doing work/chores  Not difficult at all Somewhat difficult          05/07/2023   10:44 AM  Fall Risk   Falls in the past year? 1  Number falls in past yr: 1  Injury with Fall? 1    Patient Care Team: Blane Ohara, MD as PCP - General (Family Medicine) Doristine Bosworth., MD (Endocrinology) Carney Harder, MD as Referring Physician (Geriatric Medicine) [facility] Geraldine Contras, MD as Referring Physician (Psychiatry) Ancil Boozer, DPM (Inactive) as Referring Physician (Podiatry) Weston Settle, MD as Consulting Physician (Oncology) Barnabas Lister, MD as Referring Physician (Nephrology)   Review of Systems  Constitutional:  Positive for fatigue. Negative for appetite change and fever.  HENT:  Negative for congestion, ear pain, sinus pressure and sore throat.   Respiratory:  Negative for cough, chest tightness, shortness of breath and wheezing.   Cardiovascular:  Negative for chest pain and palpitations.  Gastrointestinal:  Negative for abdominal pain, constipation, diarrhea, nausea and vomiting.  Genitourinary:  Negative for dysuria and hematuria.  Musculoskeletal:  Negative for arthralgias, back pain, joint swelling and myalgias.  Skin:  Negative for rash.  Neurological:  Positive for weakness. Negative for dizziness and headaches.  Psychiatric/Behavioral:  Negative for dysphoric mood.  The patient is not nervous/anxious.     Current Outpatient Medications on File Prior to Visit  Medication Sig Dispense Refill   acetaminophen (TYLENOL) 325 MG tablet Take by mouth.     ALPRAZolam (XANAX) 0.25 MG tablet Take 1 tablet by mouth twice daily as needed for anxiety 60 tablet 0   budesonide-formoterol (SYMBICORT) 160-4.5 MCG/ACT inhaler Inhale 2 puffs into the lungs 2 (two) times daily. 10.2 g 2   calcitRIOL (ROCALTROL) 0.5 MCG capsule Take 0.5 mcg by mouth 2 (two) times daily.     calcium carbonate (OSCAL) 1500 (600 Ca) MG TABS tablet Take 3,600 mg by mouth 2 (two) times daily with a meal.     CONSTULOSE 10 GM/15ML solution Take 20 g by mouth 2 (two) times daily.     Cyanocobalamin (VITAMIN B 12 PO) Take 1,000 mcg by mouth daily.     cyclobenzaprine (FLEXERIL) 10 MG tablet Take 1 tablet by mouth three times daily as needed for muscle spasm 90 tablet 0   famotidine (PEPCID) 40 MG tablet Take 40 mg by mouth at bedtime.     furosemide (LASIX) 40 MG tablet Take 2 tablets (80 mg total) by mouth 2 (two) times daily. 360 tablet 0   gabapentin (NEURONTIN) 100 MG capsule TAKE 1 CAPSULE BY MOUTH THREE TIMES DAILY 90 capsule 0   Glucagon (GVOKE HYPOPEN 2-PACK) 0.5 MG/0.1ML SOAJ If glucose less than 60, use gvoke pen x 1. May repeat if not responding x 1 daily. Call EMS if not improving. 0.2 mL 2   hydrOXYzine (VISTARIL) 25 MG capsule TAKE 1 CAPSULE BY MOUTH THREE TIMES DAILY 90 capsule 5   Iron-Vitamins (GERITOL COMPLETE) TABS Take 1 tablet by mouth daily.     lamoTRIgine (LAMICTAL) 200 MG tablet Take 200 mg by mouth at bedtime.     levothyroxine (SYNTHROID) 150 MCG tablet TAKE 1 TABLET BY MOUTH ONCE DAILY BEFORE  BREAKFAST 90 tablet 0   magnesium oxide (MAG-OX) 400 MG tablet Take 400 mg by mouth daily.     midodrine (PROAMATINE) 2.5 MG tablet TAKE 1 TABLET BY MOUTH THREE TIMES DAILY WITH MEALS 90 tablet 0   omeprazole (PRILOSEC) 40 MG capsule Take 40 mg by mouth daily.     ondansetron  (ZOFRAN) 4 MG tablet TAKE 1 TABLET(4 MG) BY MOUTH EVERY 8 HOURS AS NEEDED FOR NAUSEA OR VOMITING 60 tablet 3   pantoprazole (PROTONIX) 40 MG tablet TAKE 1 TABLET(40 MG) BY MOUTH DAILY 90 tablet 0   phentermine (ADIPEX-P) 37.5 MG tablet Take 37.5 mg by mouth every morning.     promethazine (PHENERGAN) 25 MG tablet TAKE 1 TABLET BY MOUTH FOUR TIMES DAILY AS NEEDED FOR NAUSEA OR VOMITING 40 tablet 2   propranolol (INDERAL) 10 MG tablet Take 5 mg by mouth at bedtime.     QUEtiapine (SEROQUEL) 50 MG tablet Take 100 mg by mouth at  bedtime.     sertraline (ZOLOFT) 100 MG tablet Take 100 mg by mouth in the morning and at bedtime.     sevelamer carbonate (RENVELA) 800 MG tablet Take 800 mg by mouth 3 (three) times daily with meals.     spironolactone (ALDACTONE) 50 MG tablet Take 2 tablets by mouth twice daily 120 tablet 0   thiamine 100 MG tablet Take 100 mg by mouth daily.     traMADol (ULTRAM) 50 MG tablet TAKE 2 TABLETS BY MOUTH TWICE DAILY (Patient taking differently: Take 100 mg by mouth 2 (two) times daily as needed for severe pain (pain score 7-10).) 120 tablet 1   Vitamin D, Ergocalciferol, (DRISDOL) 1.25 MG (50000 UNIT) CAPS capsule Take 1 capsule (50,000 Units total) by mouth 2 (two) times a week. 24 capsule 0   zolpidem (AMBIEN) 10 MG tablet Take 1 tablet (10 mg total) by mouth at bedtime. 30 tablet 3   No current facility-administered medications on file prior to visit.   Past Medical History:  Diagnosis Date   Alcohol dependence in remission (HCC) 10/04/2013   Alcoholic cirrhosis of liver (HCC)    Anemia    Anemia of chronic disease 08/08/2016   Arterial hypotension 05/08/2020   Bipolar disorder current episode depressed (HCC)    Brain TIA 02/19/2020   CKD (chronic kidney disease)    Episodic mood disorder (HCC) 11/07/2013   Generalized anxiety disorder    Generalized weakness 07/11/2020   GERD (gastroesophageal reflux disease)    H/O bariatric surgery 06/11/2020   Hypocalcemia  12/10/2015   Hypokalemia    Hyponatremia 02/19/2020   Intestinal malabsorption    Iron deficiency anemia due to chronic blood loss 05/08/2020   Korsakoff syndrome (HCC)    Lumbar disc disease 10/19/2019   Microscopic hematuria 04/16/2021   Migraine 04/20/2014   Mild recurrent major depression (HCC) 04/19/2020   Multiple closed fractures of ribs of left side 06/03/2020   Formatting of this note might be different from the original. Added automatically from request for surgery [id number]   Neuropathy 01/31/2020   Obsessive-compulsive disorder 11/07/2013   Other osteoporosis without current pathological fracture    Peripheral edema 11/01/2020   Polypharmacy 06/12/2020   Portal hypertension (HCC)    Post-surgical hypothyroidism 12/10/2015   Primary insomnia 03/26/2021   Severe protein-calorie malnutrition (HCC) 03/26/2021   Shortness of breath 11/01/2020   Stage 3b chronic kidney disease (HCC) 04/16/2021   Stroke (HCC)     left basal ganglia stroke (hemorrhagic)   Telogen effluvium 03/26/2021   Ulcer of left foot with fat layer exposed (HCC) 10/04/2020   Ulcer of right foot, with fat layer exposed (HCC) 10/10/2020   Vitamin D deficiency    Past Surgical History:  Procedure Laterality Date   BREAST EXCISIONAL BIOPSY Left    CATARACT EXTRACTION     GASTRIC BYPASS  2005   HEMORRHOID SURGERY     HERNIA REPAIR     metatarsus abductus surgery Left 1990   PARTIAL HYSTERECTOMY  05/1997   BL Ovaries remain. Done for fibroids.   THYROIDECTOMY  03/2013    Family History  Problem Relation Age of Onset   Breast cancer Mother    Osteoporosis Mother    Cancer Mother        Breast, lung, skin.    Breast cancer Maternal Grandmother    Social History   Socioeconomic History   Marital status: Married    Spouse name: Not on file   Number of children:  1   Years of education: Not on file   Highest education level: Associate degree: occupational, Scientist, product/process development, or vocational program  Occupational History    Occupation: IT trainer    Comment: Retired.  Tobacco Use   Smoking status: Never   Smokeless tobacco: Never  Substance and Sexual Activity   Alcohol use: Not Currently    Comment: history of alcoholism. sober since 2014.   Drug use: Never   Sexual activity: Yes  Other Topics Concern   Not on file  Social History Narrative   Right handed   Social Drivers of Health   Financial Resource Strain: Low Risk  (09/29/2023)   Overall Financial Resource Strain (CARDIA)    Difficulty of Paying Living Expenses: Not hard at all  Recent Concern: Financial Resource Strain - Medium Risk (09/07/2023)   Overall Financial Resource Strain (CARDIA)    Difficulty of Paying Living Expenses: Somewhat hard  Food Insecurity: No Food Insecurity (09/29/2023)   Hunger Vital Sign    Worried About Running Out of Food in the Last Year: Never true    Ran Out of Food in the Last Year: Never true  Transportation Needs: No Transportation Needs (09/29/2023)   PRAPARE - Administrator, Civil Service (Medical): No    Lack of Transportation (Non-Medical): No  Physical Activity: Insufficiently Active (09/29/2023)   Exercise Vital Sign    Days of Exercise per Week: 2 days    Minutes of Exercise per Session: 30 min  Stress: No Stress Concern Present (09/29/2023)   [employer] of Occupational Health - Occupational Stress Questionnaire    Feeling of Stress : Only a little  Social Connections: Moderately Isolated (09/29/2023)   Social Connection and Isolation Panel [NHANES]    Frequency of Communication with Friends and Family: More than three times a week    Frequency of Social Gatherings with Friends and Family: Once a week    Attends Religious Services: Never    Diplomatic Services operational officer: No    Attends Engineer, structural: Never    Marital Status: Married    Objective:  BP (!) 98/52 (BP Location: Right Arm, Patient Position: Sitting)   Pulse 89   Temp (!) 97.2 F (36.2 C)  (Temporal)   Ht 5\' 6"  (1.676 m)   Wt 142 lb (64.4 kg)   SpO2 98%   BMI 22.92 kg/m      09/30/2023    9:07 AM 09/28/2023    1:56 PM 09/24/2023    9:58 AM  BP/Weight  Systolic BP 98 114 131  Diastolic BP 52 79 81  Wt. (Lbs) 142 147.25 150.1  BMI 22.92 kg/m2 23.77 kg/m2 24.23 kg/m2    Physical Exam Vitals reviewed.  Constitutional:      Appearance: Normal appearance. She is normal weight.  Neck:     Vascular: No carotid bruit.  Cardiovascular:     Rate and Rhythm: Normal rate and regular rhythm.     Heart sounds: Murmur heard.  Pulmonary:     Effort: Pulmonary effort is normal. No respiratory distress.     Breath sounds: Normal breath sounds.  Abdominal:     General: Abdomen is flat. Bowel sounds are normal.     Palpations: Abdomen is soft.     Tenderness: There is no abdominal tenderness.  Feet:     Comments: Deformities: see photos.  Neurological:     Mental Status: She is alert and oriented to person, place, and time.  Psychiatric:  Mood and Affect: Mood normal.        Behavior: Behavior normal.          Diabetic Foot Exam - Simple   No data filed      Lab Results  Component Value Date   WBC 13.2 (H) 09/30/2023   HGB 9.1 (L) 09/30/2023   HCT 29.4 (L) 09/30/2023   PLT 449 09/30/2023   GLUCOSE 74 09/30/2023   CHOL 121 03/01/2023   TRIG 65 03/01/2023   HDL 68 03/01/2023   LDLCALC 39 03/01/2023   ALT 14 09/30/2023   AST 21 09/30/2023   NA 137 09/30/2023   K 4.3 09/30/2023   CL 97 09/30/2023   CREATININE 1.43 (H) 09/30/2023   BUN 31 (H) 09/30/2023   CO2 25 09/30/2023   TSH 30.000 (H) 05/06/2023      Assessment & Plan:       Liver cirrhosis She is on spironolactone and lactulose for management. - Continue spironolactone as prescribed.  Portal hypertension Discrepancy in propranolol dosage requires clarification. - Contact prescribing physician to clarify propranolol dosage.  Constipation Constipation likely exacerbated by liver  condition. Goal is daily bowel movements. - Increase lactulose to three times a day, and up to four times a day if needed, to achieve daily bowel movements.  Anemia of chronic disease Anemia contributes to fatigue and exertional dyspnea. She receives Procrit injections monthly. - Continue Procrit injections monthly at the hematologist's office.  Hypotension Midodrine generally maintains blood pressure at or above 100 mmHg. - Continue midodrine as prescribed.  Hypocalcemia Current regimen of calcitriol and Os-Cal D is ongoing without issues. - Continue current regimen of calcitriol and Os-Cal D.  Peripheral neuropathy Managed with gabapentin. - Continue gabapentin as prescribed.  Oral ulcers Persistent oral ulcers respond to Kenalog paste but recur frequently. Dental evaluation suggests potential extraction of remaining teeth. - Continue using Kenalog paste for oral ulcers. - Consider dental evaluation for potential extraction of remaining teeth.  Medication management Complex regimen with redundancies. Taking both omeprazole and pantoprazole is redundant. - Contact prescribing physician to clarify the need for both omeprazole and pantoprazole. - Provide her with a list of current medications to review and update with her healthcare providers.  General Health Maintenance Received new pneumonia vaccine, Capvaxib. Discussed frequency of pneumonia vaccinations. - Ensure she is up to date with pneumonia vaccinations.         Portal hypertension (HCC) Assessment & Plan: Continue propranolol.  Orders: -     CBC with Differential/Platelet -     Comprehensive metabolic panel  Chronic idiopathic constipation Assessment & Plan:  Continue amitiza 24 mcg twice daily and miralax and benefiber.    Gastroesophageal reflux disease without esophagitis Assessment & Plan: Continue famotidine 40 mg daily,  pantoprazole 40 mg daily.    Stage 3b chronic kidney disease [facility]) Assessment  & Plan: Management per specialist.  Dr. Elenore Rota.       No orders of the defined types were placed in this encounter.   Orders Placed This Encounter  Procedures   CBC with Differential/Platelet   Comprehensive metabolic panel     Follow-up: No follow-ups on file.   I,Marla I Leal-Borjas,acting as a scribe for Blane Ohara, MD.,have documented all relevant documentation on the behalf of Blane Ohara, MD,as directed by  Blane Ohara, MD while in the presence of Blane Ohara, MD.   An After Visit Summary was printed and given to the patient.  Blane Ohara, MD [facility] [phone]  [phone]  

## 2023-09-29 NOTE — Telephone Encounter (Signed)
 Contacted pt to schedule an appt. Unable to reach via phone, voicemail was left.    Scheduling Message Entered by Domenic Schwab on 09/28/2023 at  2:06 PM Priority: Routine <No visit type provided>  Department: [facility]- MED ONC  Provider:  Appointment Notes:  Please schedule pt for retacrit/labs once per month. Next dose due on 10/26/23                                         Follow-up disposition: Return in about 4 months (around 01/24/2024).  Check out comments: Monthly cbc  Retacrit for hgb<10

## 2023-09-30 ENCOUNTER — Ambulatory Visit (INDEPENDENT_AMBULATORY_CARE_PROVIDER_SITE_OTHER): Payer: Medicare HMO | Admitting: Family Medicine

## 2023-09-30 ENCOUNTER — Other Ambulatory Visit: Payer: Self-pay | Admitting: Family Medicine

## 2023-09-30 ENCOUNTER — Other Ambulatory Visit: Payer: Self-pay | Admitting: Physician Assistant

## 2023-09-30 ENCOUNTER — Encounter: Payer: Self-pay | Admitting: Family Medicine

## 2023-09-30 VITALS — BP 98/52 | HR 89 | Temp 97.2°F | Ht 66.0 in | Wt 142.0 lb

## 2023-09-30 DIAGNOSIS — A4102 Sepsis due to Methicillin resistant Staphylococcus aureus: Secondary | ICD-10-CM | POA: Diagnosis not present

## 2023-09-30 DIAGNOSIS — K766 Portal hypertension: Secondary | ICD-10-CM | POA: Diagnosis not present

## 2023-09-30 DIAGNOSIS — E876 Hypokalemia: Secondary | ICD-10-CM | POA: Diagnosis not present

## 2023-09-30 DIAGNOSIS — E890 Postprocedural hypothyroidism: Secondary | ICD-10-CM | POA: Diagnosis not present

## 2023-09-30 DIAGNOSIS — K146 Glossodynia: Secondary | ICD-10-CM

## 2023-09-30 DIAGNOSIS — G43909 Migraine, unspecified, not intractable, without status migrainosus: Secondary | ICD-10-CM | POA: Diagnosis not present

## 2023-09-30 DIAGNOSIS — I4891 Unspecified atrial fibrillation: Secondary | ICD-10-CM | POA: Diagnosis not present

## 2023-09-30 DIAGNOSIS — K5904 Chronic idiopathic constipation: Secondary | ICD-10-CM | POA: Diagnosis not present

## 2023-09-30 DIAGNOSIS — N1832 Chronic kidney disease, stage 3b: Secondary | ICD-10-CM

## 2023-09-30 DIAGNOSIS — G621 Alcoholic polyneuropathy: Secondary | ICD-10-CM | POA: Diagnosis not present

## 2023-09-30 DIAGNOSIS — I2510 Atherosclerotic heart disease of native coronary artery without angina pectoris: Secondary | ICD-10-CM | POA: Diagnosis not present

## 2023-09-30 DIAGNOSIS — I700 Atherosclerosis of aorta: Secondary | ICD-10-CM | POA: Diagnosis not present

## 2023-09-30 DIAGNOSIS — E892 Postprocedural hypoparathyroidism: Secondary | ICD-10-CM | POA: Diagnosis not present

## 2023-09-30 DIAGNOSIS — D638 Anemia in other chronic diseases classified elsewhere: Secondary | ICD-10-CM | POA: Diagnosis not present

## 2023-09-30 DIAGNOSIS — F0393 Unspecified dementia, unspecified severity, with mood disturbance: Secondary | ICD-10-CM | POA: Diagnosis not present

## 2023-09-30 DIAGNOSIS — D631 Anemia in chronic kidney disease: Secondary | ICD-10-CM | POA: Diagnosis not present

## 2023-09-30 DIAGNOSIS — K219 Gastro-esophageal reflux disease without esophagitis: Secondary | ICD-10-CM

## 2023-09-30 DIAGNOSIS — F411 Generalized anxiety disorder: Secondary | ICD-10-CM | POA: Diagnosis not present

## 2023-09-30 DIAGNOSIS — F319 Bipolar disorder, unspecified: Secondary | ICD-10-CM | POA: Diagnosis not present

## 2023-09-30 DIAGNOSIS — I051 Rheumatic mitral insufficiency: Secondary | ICD-10-CM | POA: Diagnosis not present

## 2023-09-30 DIAGNOSIS — E1169 Type 2 diabetes mellitus with other specified complication: Secondary | ICD-10-CM | POA: Diagnosis not present

## 2023-09-30 DIAGNOSIS — M869 Osteomyelitis, unspecified: Secondary | ICD-10-CM | POA: Diagnosis not present

## 2023-09-30 DIAGNOSIS — K7030 Alcoholic cirrhosis of liver without ascites: Secondary | ICD-10-CM | POA: Diagnosis not present

## 2023-09-30 DIAGNOSIS — F0394 Unspecified dementia, unspecified severity, with anxiety: Secondary | ICD-10-CM | POA: Diagnosis not present

## 2023-09-30 DIAGNOSIS — R911 Solitary pulmonary nodule: Secondary | ICD-10-CM | POA: Diagnosis not present

## 2023-09-30 DIAGNOSIS — E871 Hypo-osmolality and hyponatremia: Secondary | ICD-10-CM | POA: Diagnosis not present

## 2023-09-30 DIAGNOSIS — N179 Acute kidney failure, unspecified: Secondary | ICD-10-CM | POA: Diagnosis not present

## 2023-09-30 DIAGNOSIS — E1122 Type 2 diabetes mellitus with diabetic chronic kidney disease: Secondary | ICD-10-CM | POA: Diagnosis not present

## 2023-09-30 DIAGNOSIS — Z452 Encounter for adjustment and management of vascular access device: Secondary | ICD-10-CM | POA: Diagnosis not present

## 2023-09-30 NOTE — Telephone Encounter (Signed)
 Patient has been scheduled. Aware of appt dates and times.

## 2023-09-30 NOTE — Patient Instructions (Signed)
 VISIT SUMMARY:  During today's visit, we discussed your ongoing health issues, including liver cirrhosis, portal hypertension, constipation, anemia, hypotension, hypocalcemia, peripheral neuropathy, and oral ulcers. We reviewed your current medications and made some adjustments to better manage your symptoms. We also discussed the importance of staying up to date with your vaccinations.  YOUR PLAN:  -LIVER CIRRHOSIS: Liver cirrhosis is a condition where the liver is severely scarred and its function is impaired. Continue taking spironolactone as prescribed to manage this condition.  -PORTAL HYPERTENSION: Portal hypertension is high blood pressure in the veins that supply the liver. We need to clarify the correct dosage of propranolol with your prescribing physician.  -CONSTIPATION: Constipation is difficulty in having regular bowel movements. Increase lactulose to three times a day, and up to four times a day if needed, to achieve daily bowel movements.  -ANEMIA OF CHRONIC DISEASE: Anemia of chronic disease is a type of anemia that occurs with chronic illness. Continue receiving your monthly Procrit injections at the hematologist's office.  -HYPOTENSION: Hypotension is low blood pressure. Continue taking midodrine as prescribed to maintain your blood pressure.  -HYPOCALCEMIA: Hypocalcemia is low calcium levels in the blood. Continue your current regimen of calcitriol and Os-Cal D.  -PERIPHERAL NEUROPATHY: Peripheral neuropathy is damage to the nerves outside the brain and spinal cord. Continue taking gabapentin as prescribed to manage this condition.  -ORAL ULCERS: Oral ulcers are sores in the mouth. Continue using Kenalog paste for your oral ulcers and consider a dental evaluation for potential extraction of your remaining teeth.  -MEDICATION MANAGEMENT: We need to review your medications to avoid redundancies. Contact your prescribing physician to clarify the need for both omeprazole and  pantoprazole. We will provide you with a list of your current medications to review and update with your healthcare providers.  -GENERAL HEALTH MAINTENANCE: You received the new pneumonia vaccine, Capvaxib. Ensure you stay up to date with your pneumonia vaccinations.  INSTRUCTIONS:  Please follow up with your prescribing physician to clarify the dosage of propranolol and the need for both omeprazole and pantoprazole. Continue with your current medication regimen as discussed. Make sure to stay up to date with your pneumonia vaccinations. Consider scheduling a dental evaluation for potential extraction of your remaining teeth.

## 2023-10-01 LAB — COMPREHENSIVE METABOLIC PANEL
ALT: 14 IU/L (ref 0–32)
AST: 21 IU/L (ref 0–40)
Albumin: 2.6 g/dL — ABNORMAL LOW (ref 3.8–4.9)
Alkaline Phosphatase: 267 IU/L — ABNORMAL HIGH (ref 44–121)
BUN/Creatinine Ratio: 22 (ref 9–23)
BUN: 31 mg/dL — ABNORMAL HIGH (ref 6–24)
Bilirubin Total: 0.3 mg/dL (ref 0.0–1.2)
CO2: 25 mmol/L (ref 20–29)
Calcium: 8.8 mg/dL (ref 8.7–10.2)
Chloride: 97 mmol/L (ref 96–106)
Creatinine, Ser: 1.43 mg/dL — ABNORMAL HIGH (ref 0.57–1.00)
Globulin, Total: 2.9 g/dL (ref 1.5–4.5)
Glucose: 74 mg/dL (ref 70–99)
Potassium: 4.3 mmol/L (ref 3.5–5.2)
Sodium: 137 mmol/L (ref 134–144)
Total Protein: 5.5 g/dL — ABNORMAL LOW (ref 6.0–8.5)
eGFR: 42 mL/min/{1.73_m2} — ABNORMAL LOW (ref >59)

## 2023-10-01 LAB — CBC WITH DIFFERENTIAL/PLATELET
Basophils Absolute: 0.1 10*3/uL (ref 0.0–0.2)
Basos: 1 %
EOS (ABSOLUTE): 0.1 10*3/uL (ref 0.0–0.4)
Eos: 1 %
Hematocrit: 29.4 % — ABNORMAL LOW (ref 34.0–46.6)
Hemoglobin: 9.1 g/dL — ABNORMAL LOW (ref 11.1–15.9)
Immature Grans (Abs): 0.2 10*3/uL — ABNORMAL HIGH (ref 0.0–0.1)
Immature Granulocytes: 1 %
Lymphocytes Absolute: 1.6 10*3/uL (ref 0.7–3.1)
Lymphs: 12 %
MCH: 29.4 pg (ref 26.6–33.0)
MCHC: 31 g/dL — ABNORMAL LOW (ref 31.5–35.7)
MCV: 95 fL (ref 79–97)
Monocytes Absolute: 0.9 10*3/uL (ref 0.1–0.9)
Monocytes: 7 %
Neutrophils Absolute: 10.3 10*3/uL — ABNORMAL HIGH (ref 1.4–7.0)
Neutrophils: 78 %
Platelets: 449 10*3/uL (ref 150–450)
RBC: 3.09 x10E6/uL — ABNORMAL LOW (ref 3.77–5.28)
RDW: 15 % (ref 11.7–15.4)
WBC: 13.2 10*3/uL — ABNORMAL HIGH (ref 3.4–10.8)

## 2023-10-01 MED ORDER — MAGIC MOUTHWASH W/LIDOCAINE: ORAL | 0 refills | Status: DC

## 2023-10-02 ENCOUNTER — Encounter: Payer: Self-pay | Admitting: Family Medicine

## 2023-10-03 ENCOUNTER — Encounter: Payer: Self-pay | Admitting: Family Medicine

## 2023-10-03 NOTE — Assessment & Plan Note (Signed)
Continue amitiza 24 mcg twice daily and miralax and benefiber.

## 2023-10-03 NOTE — Assessment & Plan Note (Addendum)
 Continue famotidine 40 mg daily,  pantoprazole 40 mg daily.

## 2023-10-03 NOTE — Assessment & Plan Note (Signed)
Management per specialist.  Dr. Sadiq.  

## 2023-10-03 NOTE — Assessment & Plan Note (Signed)
 -  Continue propranolol

## 2023-10-04 ENCOUNTER — Other Ambulatory Visit: Payer: Self-pay

## 2023-10-04 DIAGNOSIS — K146 Glossodynia: Secondary | ICD-10-CM

## 2023-10-04 NOTE — Telephone Encounter (Signed)
 Patient would like request sent again [facility] is saying they never got RX

## 2023-10-05 ENCOUNTER — Ambulatory Visit: Payer: Medicare HMO

## 2023-10-05 MED ORDER — TRIAMCINOLONE ACETONIDE 0.1 % MT PSTE: PASTE | Freq: Two times a day (BID) | OROMUCOSAL | 0 refills | Status: DC

## 2023-10-05 MED ORDER — MAGIC MOUTHWASH W/LIDOCAINE: ORAL | 0 refills | Status: DC

## 2023-10-06 ENCOUNTER — Other Ambulatory Visit: Payer: Self-pay

## 2023-10-06 DIAGNOSIS — K146 Glossodynia: Secondary | ICD-10-CM

## 2023-10-07 ENCOUNTER — Ambulatory Visit

## 2023-10-07 ENCOUNTER — Other Ambulatory Visit: Payer: Self-pay | Admitting: Family Medicine

## 2023-10-07 DIAGNOSIS — Z452 Encounter for adjustment and management of vascular access device: Secondary | ICD-10-CM | POA: Diagnosis not present

## 2023-10-07 DIAGNOSIS — M869 Osteomyelitis, unspecified: Secondary | ICD-10-CM | POA: Diagnosis not present

## 2023-10-07 DIAGNOSIS — E876 Hypokalemia: Secondary | ICD-10-CM | POA: Diagnosis not present

## 2023-10-07 DIAGNOSIS — K7030 Alcoholic cirrhosis of liver without ascites: Secondary | ICD-10-CM | POA: Diagnosis not present

## 2023-10-07 DIAGNOSIS — N179 Acute kidney failure, unspecified: Secondary | ICD-10-CM | POA: Diagnosis not present

## 2023-10-07 DIAGNOSIS — E1169 Type 2 diabetes mellitus with other specified complication: Secondary | ICD-10-CM | POA: Diagnosis not present

## 2023-10-07 DIAGNOSIS — F0393 Unspecified dementia, unspecified severity, with mood disturbance: Secondary | ICD-10-CM | POA: Diagnosis not present

## 2023-10-07 DIAGNOSIS — F411 Generalized anxiety disorder: Secondary | ICD-10-CM | POA: Diagnosis not present

## 2023-10-07 DIAGNOSIS — A4102 Sepsis due to Methicillin resistant Staphylococcus aureus: Secondary | ICD-10-CM | POA: Diagnosis not present

## 2023-10-07 DIAGNOSIS — E890 Postprocedural hypothyroidism: Secondary | ICD-10-CM | POA: Diagnosis not present

## 2023-10-07 DIAGNOSIS — E871 Hypo-osmolality and hyponatremia: Secondary | ICD-10-CM | POA: Diagnosis not present

## 2023-10-07 DIAGNOSIS — G43909 Migraine, unspecified, not intractable, without status migrainosus: Secondary | ICD-10-CM | POA: Diagnosis not present

## 2023-10-07 DIAGNOSIS — E1122 Type 2 diabetes mellitus with diabetic chronic kidney disease: Secondary | ICD-10-CM | POA: Diagnosis not present

## 2023-10-07 DIAGNOSIS — F319 Bipolar disorder, unspecified: Secondary | ICD-10-CM | POA: Diagnosis not present

## 2023-10-07 DIAGNOSIS — E892 Postprocedural hypoparathyroidism: Secondary | ICD-10-CM | POA: Diagnosis not present

## 2023-10-07 DIAGNOSIS — K766 Portal hypertension: Secondary | ICD-10-CM | POA: Diagnosis not present

## 2023-10-07 DIAGNOSIS — G621 Alcoholic polyneuropathy: Secondary | ICD-10-CM | POA: Diagnosis not present

## 2023-10-07 DIAGNOSIS — N1832 Chronic kidney disease, stage 3b: Secondary | ICD-10-CM | POA: Diagnosis not present

## 2023-10-07 DIAGNOSIS — I2510 Atherosclerotic heart disease of native coronary artery without angina pectoris: Secondary | ICD-10-CM | POA: Diagnosis not present

## 2023-10-07 DIAGNOSIS — I700 Atherosclerosis of aorta: Secondary | ICD-10-CM | POA: Diagnosis not present

## 2023-10-07 DIAGNOSIS — I051 Rheumatic mitral insufficiency: Secondary | ICD-10-CM | POA: Diagnosis not present

## 2023-10-07 DIAGNOSIS — F0394 Unspecified dementia, unspecified severity, with anxiety: Secondary | ICD-10-CM | POA: Diagnosis not present

## 2023-10-07 DIAGNOSIS — R911 Solitary pulmonary nodule: Secondary | ICD-10-CM | POA: Diagnosis not present

## 2023-10-07 DIAGNOSIS — I4891 Unspecified atrial fibrillation: Secondary | ICD-10-CM | POA: Diagnosis not present

## 2023-10-07 DIAGNOSIS — D631 Anemia in chronic kidney disease: Secondary | ICD-10-CM | POA: Diagnosis not present

## 2023-10-07 DIAGNOSIS — R110 Nausea: Secondary | ICD-10-CM

## 2023-10-07 NOTE — Telephone Encounter (Signed)
 Left patient a message letting her know that [facility] can't fill this prescription that they don't have the medication and that if she is still needing the magic mouthwash to give [place] a call back and let us know what pharmacy it needs to be sent to.

## 2023-10-08 NOTE — Assessment & Plan Note (Signed)
 She is on spironolactone and lactulose for management. - Increase lactulose to 3-4 times per day. Goal is to have a BM daily.

## 2023-10-08 NOTE — Assessment & Plan Note (Signed)
 Managed with gabapentin.  -Continue gabapentin as prescribed.

## 2023-10-08 NOTE — Assessment & Plan Note (Signed)
 Anemia contributes to fatigue and exertional dyspnea. She receives Procrit injections monthly. - Continue Procrit injections monthly at the hematologist's office.

## 2023-10-08 NOTE — Assessment & Plan Note (Signed)
 Current regimen of calcitriol and Os-Cal D is ongoing without issues. - Continue current regimen of calcitriol and Os-Cal D.

## 2023-10-11 ENCOUNTER — Other Ambulatory Visit: Payer: Self-pay

## 2023-10-11 NOTE — Telephone Encounter (Signed)
 Patient is calling to have this sent to [facility] [place].

## 2023-10-12 DIAGNOSIS — I700 Atherosclerosis of aorta: Secondary | ICD-10-CM | POA: Diagnosis not present

## 2023-10-12 DIAGNOSIS — D631 Anemia in chronic kidney disease: Secondary | ICD-10-CM | POA: Diagnosis not present

## 2023-10-12 DIAGNOSIS — E871 Hypo-osmolality and hyponatremia: Secondary | ICD-10-CM | POA: Diagnosis not present

## 2023-10-12 DIAGNOSIS — E892 Postprocedural hypoparathyroidism: Secondary | ICD-10-CM | POA: Diagnosis not present

## 2023-10-12 DIAGNOSIS — F319 Bipolar disorder, unspecified: Secondary | ICD-10-CM | POA: Diagnosis not present

## 2023-10-12 DIAGNOSIS — K766 Portal hypertension: Secondary | ICD-10-CM | POA: Diagnosis not present

## 2023-10-12 DIAGNOSIS — K7030 Alcoholic cirrhosis of liver without ascites: Secondary | ICD-10-CM | POA: Diagnosis not present

## 2023-10-12 DIAGNOSIS — R911 Solitary pulmonary nodule: Secondary | ICD-10-CM | POA: Diagnosis not present

## 2023-10-12 DIAGNOSIS — G621 Alcoholic polyneuropathy: Secondary | ICD-10-CM | POA: Diagnosis not present

## 2023-10-12 DIAGNOSIS — F0394 Unspecified dementia, unspecified severity, with anxiety: Secondary | ICD-10-CM | POA: Diagnosis not present

## 2023-10-12 DIAGNOSIS — I2510 Atherosclerotic heart disease of native coronary artery without angina pectoris: Secondary | ICD-10-CM | POA: Diagnosis not present

## 2023-10-12 DIAGNOSIS — F411 Generalized anxiety disorder: Secondary | ICD-10-CM | POA: Diagnosis not present

## 2023-10-12 DIAGNOSIS — E1122 Type 2 diabetes mellitus with diabetic chronic kidney disease: Secondary | ICD-10-CM | POA: Diagnosis not present

## 2023-10-12 DIAGNOSIS — E876 Hypokalemia: Secondary | ICD-10-CM | POA: Diagnosis not present

## 2023-10-12 DIAGNOSIS — I4891 Unspecified atrial fibrillation: Secondary | ICD-10-CM | POA: Diagnosis not present

## 2023-10-12 DIAGNOSIS — N1832 Chronic kidney disease, stage 3b: Secondary | ICD-10-CM | POA: Diagnosis not present

## 2023-10-12 DIAGNOSIS — Z452 Encounter for adjustment and management of vascular access device: Secondary | ICD-10-CM | POA: Diagnosis not present

## 2023-10-12 DIAGNOSIS — A4102 Sepsis due to Methicillin resistant Staphylococcus aureus: Secondary | ICD-10-CM | POA: Diagnosis not present

## 2023-10-12 DIAGNOSIS — G43909 Migraine, unspecified, not intractable, without status migrainosus: Secondary | ICD-10-CM | POA: Diagnosis not present

## 2023-10-12 DIAGNOSIS — F0393 Unspecified dementia, unspecified severity, with mood disturbance: Secondary | ICD-10-CM | POA: Diagnosis not present

## 2023-10-12 DIAGNOSIS — N179 Acute kidney failure, unspecified: Secondary | ICD-10-CM | POA: Diagnosis not present

## 2023-10-12 DIAGNOSIS — E890 Postprocedural hypothyroidism: Secondary | ICD-10-CM | POA: Diagnosis not present

## 2023-10-12 DIAGNOSIS — M869 Osteomyelitis, unspecified: Secondary | ICD-10-CM | POA: Diagnosis not present

## 2023-10-12 DIAGNOSIS — I051 Rheumatic mitral insufficiency: Secondary | ICD-10-CM | POA: Diagnosis not present

## 2023-10-12 DIAGNOSIS — E1169 Type 2 diabetes mellitus with other specified complication: Secondary | ICD-10-CM | POA: Diagnosis not present

## 2023-10-12 MED ORDER — GABAPENTIN 100 MG PO CAPS: 100.0000 mg | ORAL_CAPSULE | Freq: Three times a day (TID) | ORAL | 5 refills | Status: DC

## 2023-10-20 DIAGNOSIS — E871 Hypo-osmolality and hyponatremia: Secondary | ICD-10-CM | POA: Diagnosis not present

## 2023-10-20 DIAGNOSIS — M810 Age-related osteoporosis without current pathological fracture: Secondary | ICD-10-CM | POA: Diagnosis not present

## 2023-10-20 DIAGNOSIS — Z79890 Hormone replacement therapy (postmenopausal): Secondary | ICD-10-CM | POA: Diagnosis not present

## 2023-10-20 DIAGNOSIS — C73 Malignant neoplasm of thyroid gland: Secondary | ICD-10-CM | POA: Diagnosis not present

## 2023-10-20 DIAGNOSIS — E890 Postprocedural hypothyroidism: Secondary | ICD-10-CM | POA: Diagnosis not present

## 2023-10-20 DIAGNOSIS — E559 Vitamin D deficiency, unspecified: Secondary | ICD-10-CM | POA: Diagnosis not present

## 2023-10-24 ENCOUNTER — Other Ambulatory Visit: Payer: Self-pay | Admitting: Family Medicine

## 2023-10-25 ENCOUNTER — Other Ambulatory Visit: Payer: Self-pay | Admitting: Family Medicine

## 2023-10-26 ENCOUNTER — Other Ambulatory Visit

## 2023-10-26 ENCOUNTER — Ambulatory Visit

## 2023-10-28 ENCOUNTER — Inpatient Hospital Stay: Attending: Oncology

## 2023-10-28 ENCOUNTER — Inpatient Hospital Stay

## 2023-10-28 ENCOUNTER — Other Ambulatory Visit: Payer: Self-pay

## 2023-10-28 ENCOUNTER — Encounter: Payer: Self-pay | Admitting: Oncology

## 2023-10-28 VITALS — BP 108/70 | HR 87 | Temp 98.4°F | Resp 18

## 2023-10-28 DIAGNOSIS — D638 Anemia in other chronic diseases classified elsewhere: Secondary | ICD-10-CM

## 2023-10-28 DIAGNOSIS — K219 Gastro-esophageal reflux disease without esophagitis: Secondary | ICD-10-CM | POA: Diagnosis not present

## 2023-10-28 DIAGNOSIS — F422 Mixed obsessional thoughts and acts: Secondary | ICD-10-CM | POA: Diagnosis not present

## 2023-10-28 DIAGNOSIS — D72829 Elevated white blood cell count, unspecified: Secondary | ICD-10-CM | POA: Diagnosis not present

## 2023-10-28 DIAGNOSIS — Z79899 Other long term (current) drug therapy: Secondary | ICD-10-CM | POA: Diagnosis not present

## 2023-10-28 DIAGNOSIS — F411 Generalized anxiety disorder: Secondary | ICD-10-CM | POA: Diagnosis not present

## 2023-10-28 DIAGNOSIS — D631 Anemia in chronic kidney disease: Secondary | ICD-10-CM

## 2023-10-28 DIAGNOSIS — N1832 Chronic kidney disease, stage 3b: Secondary | ICD-10-CM | POA: Diagnosis not present

## 2023-10-28 DIAGNOSIS — F330 Major depressive disorder, recurrent, mild: Secondary | ICD-10-CM | POA: Diagnosis not present

## 2023-10-28 DIAGNOSIS — D508 Other iron deficiency anemias: Secondary | ICD-10-CM

## 2023-10-28 LAB — CBC WITH DIFFERENTIAL (CANCER CENTER ONLY)
Abs Immature Granulocytes: 0.09 10*3/uL — ABNORMAL HIGH (ref 0.00–0.07)
Basophils Absolute: 0.1 10*3/uL (ref 0.0–0.1)
Basophils Relative: 1 %
Eosinophils Absolute: 0.2 10*3/uL (ref 0.0–0.5)
Eosinophils Relative: 2 %
HCT: 26.1 % — ABNORMAL LOW (ref 36.0–46.0)
Hemoglobin: 7.7 g/dL — ABNORMAL LOW (ref 12.0–15.0)
Immature Granulocytes: 1 %
Lymphocytes Relative: 13 %
Lymphs Abs: 1.6 10*3/uL (ref 0.7–4.0)
MCH: 29.3 pg (ref 26.0–34.0)
MCHC: 29.5 g/dL — ABNORMAL LOW (ref 30.0–36.0)
MCV: 99.2 fL (ref 80.0–100.0)
Monocytes Absolute: 0.8 10*3/uL (ref 0.1–1.0)
Monocytes Relative: 6 %
Neutro Abs: 10.2 10*3/uL — ABNORMAL HIGH (ref 1.7–7.7)
Neutrophils Relative %: 77 %
Platelet Count: 311 10*3/uL (ref 150–400)
RBC: 2.63 MIL/uL — ABNORMAL LOW (ref 3.87–5.11)
RDW: 16.4 % — ABNORMAL HIGH (ref 11.5–15.5)
WBC Count: 13 10*3/uL — ABNORMAL HIGH (ref 4.0–10.5)
nRBC: 0 % (ref 0.0–0.2)
nRBC: 0 /100{WBCs}

## 2023-10-28 LAB — ABO/RH: ABO/RH(D): O POS

## 2023-10-28 LAB — SAMPLE TO BLOOD BANK

## 2023-10-28 LAB — PREPARE RBC (CROSSMATCH)

## 2023-10-28 MED ORDER — EPOETIN ALFA-EPBX 40000 UNIT/ML IJ SOLN
40000.0000 [IU] | Freq: Once | INTRAMUSCULAR | Status: AC
  Administered 2023-10-28: 40000 [IU] via SUBCUTANEOUS
  Filled 2023-10-28: qty 1

## 2023-10-28 NOTE — Patient Instructions (Signed)

## 2023-10-29 ENCOUNTER — Inpatient Hospital Stay

## 2023-10-29 DIAGNOSIS — D638 Anemia in other chronic diseases classified elsewhere: Secondary | ICD-10-CM

## 2023-10-29 DIAGNOSIS — N1832 Chronic kidney disease, stage 3b: Secondary | ICD-10-CM | POA: Diagnosis not present

## 2023-10-29 DIAGNOSIS — D631 Anemia in chronic kidney disease: Secondary | ICD-10-CM | POA: Diagnosis not present

## 2023-10-29 DIAGNOSIS — D72829 Elevated white blood cell count, unspecified: Secondary | ICD-10-CM | POA: Diagnosis not present

## 2023-10-29 DIAGNOSIS — Z79899 Other long term (current) drug therapy: Secondary | ICD-10-CM | POA: Diagnosis not present

## 2023-10-29 DIAGNOSIS — K219 Gastro-esophageal reflux disease without esophagitis: Secondary | ICD-10-CM | POA: Diagnosis not present

## 2023-10-29 MED ORDER — DIPHENHYDRAMINE HCL 25 MG PO CAPS
25.0000 mg | ORAL_CAPSULE | Freq: Once | ORAL | Status: DC
Start: 2023-10-29 — End: 2023-10-29

## 2023-10-29 MED ORDER — ACETAMINOPHEN 325 MG PO TABS: 650.0000 mg | ORAL_TABLET | Freq: Once | ORAL | Status: DC

## 2023-10-29 MED ORDER — SODIUM CHLORIDE 0.9% IV SOLUTION
250.0000 mL | INTRAVENOUS | Status: DC
  Administered 2023-10-29: 250 mL via INTRAVENOUS

## 2023-10-29 NOTE — Patient Instructions (Signed)

## 2023-11-01 LAB — BPAM RBC
Blood Product Expiration Date: 202505102359
Blood Product Expiration Date: 202505102359
ISSUE DATE / TIME: 202504110725
ISSUE DATE / TIME: 202504110725
Unit Type and Rh: 5100
Unit Type and Rh: 5100

## 2023-11-01 LAB — TYPE AND SCREEN
ABO/RH(D): O POS
Antibody Screen: NEGATIVE
Unit division: 0
Unit division: 0

## 2023-11-02 ENCOUNTER — Other Ambulatory Visit: Payer: Self-pay | Admitting: Family Medicine

## 2023-11-05 ENCOUNTER — Telehealth: Payer: Self-pay | Admitting: Family Medicine

## 2023-11-05 DIAGNOSIS — N1832 Chronic kidney disease, stage 3b: Secondary | ICD-10-CM | POA: Diagnosis not present

## 2023-11-05 NOTE — Telephone Encounter (Signed)
 [employer] Tramadol  Hydrochloride and Zolpidem  Tartrate Tramadol  Hydrochloride

## 2023-11-06 DIAGNOSIS — S51001A Unspecified open wound of right elbow, initial encounter: Secondary | ICD-10-CM | POA: Diagnosis not present

## 2023-11-08 ENCOUNTER — Encounter: Payer: Self-pay | Admitting: Family Medicine

## 2023-11-08 ENCOUNTER — Other Ambulatory Visit: Payer: Self-pay | Admitting: Family Medicine

## 2023-11-08 DIAGNOSIS — S98112A Complete traumatic amputation of left great toe, initial encounter: Secondary | ICD-10-CM | POA: Diagnosis not present

## 2023-11-08 DIAGNOSIS — M7742 Metatarsalgia, left foot: Secondary | ICD-10-CM | POA: Diagnosis not present

## 2023-11-08 DIAGNOSIS — I959 Hypotension, unspecified: Secondary | ICD-10-CM

## 2023-11-08 DIAGNOSIS — L84 Corns and callosities: Secondary | ICD-10-CM | POA: Diagnosis not present

## 2023-11-08 DIAGNOSIS — G621 Alcoholic polyneuropathy: Secondary | ICD-10-CM | POA: Diagnosis not present

## 2023-11-08 NOTE — Progress Notes (Signed)
 Subjective:  Patient ID: Molly Parrish, female    DOB: 11-Aug-1965  Age: 58 y.o. MRN: [id number]  No chief complaint on file.   HPI:     05/07/2023   10:44 AM 03/01/2023    3:33 PM 06/15/2022   10:02 AM 12/05/2021   10:55 AM 11/14/2021   11:16 AM  Depression screen PHQ 2/9  Decreased Interest 0 0 0 0 0  Down, Depressed, Hopeless 0 0 0 0 0  PHQ - 2 Score 0 0 0 0 0  Altered sleeping  0 3    Tired, decreased energy  3 3    Change in appetite  0 0    Feeling bad or failure about yourself   0 0    Trouble concentrating  0 0    Moving slowly or fidgety/restless   1    Suicidal thoughts  0 0    PHQ-9 Score  3 7    Difficult doing work/chores  Not difficult at all Somewhat difficult          05/07/2023   10:44 AM  Fall Risk   Falls in the past year? 1  Number falls in past yr: 1  Injury with Fall? 1    Patient Care Team: Mercy Stall, MD as PCP - General (Family Medicine) Drury Geralds., MD (Endocrinology) Katy Parry, MD as Referring Physician (Geriatric Medicine) Center, [facility] Leobardo Rainier, MD as Referring Physician (Psychiatry) Sherolyn Dixon, DPM (Inactive) as Referring Physician (Podiatry) Deloria Fetch, MD as Consulting Physician (Oncology) Geraldyne Kluver, MD as Referring Physician (Nephrology)   Review of Systems  Current Outpatient Medications on File Prior to Visit  Medication Sig Dispense Refill   acetaminophen  (TYLENOL ) 325 MG tablet Take by mouth.     ALPRAZolam  (XANAX ) 0.25 MG tablet Take 1 tablet by mouth twice daily as needed for anxiety 60 tablet 0   budesonide -formoterol  (SYMBICORT ) 160-4.5 MCG/ACT inhaler Inhale 2 puffs into the lungs 2 (two) times daily. 10.2 g 2   calcitRIOL (ROCALTROL) 0.5 MCG capsule Take 0.5 mcg by mouth 2 (two) times daily.     calcium carbonate (OSCAL) 1500 (600 Ca) MG TABS tablet Take 3,600 mg by mouth 2 (two) times daily with a meal.     CONSTULOSE 10 GM/15ML solution Take 20 g by mouth 2 (two)  times daily.     Cyanocobalamin  (VITAMIN B 12 PO) Take 1,000 mcg by mouth daily.     cyclobenzaprine  (FLEXERIL ) 10 MG tablet Take 1 tablet by mouth three times daily as needed for muscle spasm 90 tablet 1   famotidine  (PEPCID ) 40 MG tablet Take 40 mg by mouth at bedtime.     furosemide  (LASIX ) 40 MG tablet Take 2 tablets (80 mg total) by mouth 2 (two) times daily. 360 tablet 0   gabapentin  (NEURONTIN ) 100 MG capsule Take 1 capsule (100 mg total) by mouth 3 (three) times daily. 90 capsule 5   Glucagon  (GVOKE HYPOPEN  2-PACK) 0.5 MG/0.1ML SOAJ If glucose less than 60, use gvoke pen x 1. May repeat if not responding x 1 daily. Call EMS if not improving. 0.2 mL 2   hydrOXYzine  (VISTARIL ) 25 MG capsule TAKE 1 CAPSULE BY MOUTH THREE TIMES DAILY 90 capsule 5   Iron -Vitamins (GERITOL COMPLETE) TABS Take 1 tablet by mouth daily.     lamoTRIgine (LAMICTAL) 200 MG tablet Take 200 mg by mouth at bedtime.     levothyroxine  (SYNTHROID ) 150 MCG tablet TAKE 1 TABLET BY  MOUTH ONCE DAILY BEFORE  BREAKFAST 90 tablet 0   magic mouthwash w/lidocaine  SOLN 1 tsp Swish and spit before meals and bedtime. Avoid acidic foods. 120 mL 0   magnesium oxide (MAG-OX) 400 MG tablet Take 400 mg by mouth daily.     midodrine  (PROAMATINE ) 2.5 MG tablet TAKE 1 TABLET BY MOUTH THREE TIMES DAILY WITH MEALS 90 tablet 0   ondansetron  (ZOFRAN ) 4 MG tablet TAKE 1 TABLET BY MOUTH EVERY 8 HOURS AS NEEDED FOR NAUSEA AND VOMITING 60 tablet 1   pantoprazole  (PROTONIX ) 40 MG tablet TAKE 1 TABLET(40 MG) BY MOUTH DAILY 90 tablet 0   promethazine (PHENERGAN) 25 MG tablet TAKE 1 TABLET BY MOUTH FOUR TIMES DAILY AS NEEDED FOR NAUSEA OR VOMITING 40 tablet 2   propranolol (INDERAL) 10 MG tablet Take 5 mg by mouth at bedtime.     QUEtiapine (SEROQUEL) 50 MG tablet Take 100 mg by mouth at bedtime.     sertraline (ZOLOFT) 100 MG tablet Take 100 mg by mouth in the morning and at bedtime.     sevelamer carbonate (RENVELA) 800 MG tablet Take 800 mg by mouth 3  (three) times daily with meals.     spironolactone  (ALDACTONE ) 50 MG tablet Take 2 tablets by mouth twice daily 120 tablet 3   thiamine 100 MG tablet Take 100 mg by mouth daily.     traMADol  (ULTRAM ) 50 MG tablet TAKE 2 TABLETS BY MOUTH TWICE DAILY (Patient taking differently: Take 100 mg by mouth 2 (two) times daily as needed for severe pain (pain score 7-10).) 120 tablet 1   triamcinolone  (KENALOG ) 0.1 % paste USE AS DIRECTED IN  MOUTH  OR  THROAT  TWICE  DAILY. 5 g 0   Vitamin D , Ergocalciferol , (DRISDOL ) 1.25 MG (50000 UNIT) CAPS capsule Take 1 capsule (50,000 Units total) by mouth 2 (two) times a week. 24 capsule 0   zolpidem  (AMBIEN ) 10 MG tablet TAKE 1 TABLET BY MOUTH AT BEDTIME 30 tablet 0   No current facility-administered medications on file prior to visit.   Past Medical History:  Diagnosis Date   Alcohol dependence in remission (HCC) 10/04/2013   Alcoholic cirrhosis of liver (HCC)    Anemia    Anemia of chronic disease 08/08/2016   Arterial hypotension 05/08/2020   Bipolar disorder current episode depressed (HCC)    Brain TIA 02/19/2020   CKD (chronic kidney disease)    Episodic mood disorder (HCC) 11/07/2013   Generalized anxiety disorder    Generalized weakness 07/11/2020   GERD (gastroesophageal reflux disease)    H/O bariatric surgery 06/11/2020   Hypocalcemia 12/10/2015   Hypokalemia    Hyponatremia 02/19/2020   Intestinal malabsorption    Iron  deficiency anemia due to chronic blood loss 05/08/2020   Korsakoff syndrome (HCC)    Lumbar disc disease 10/19/2019   Microscopic hematuria 04/16/2021   Migraine 04/20/2014   Mild recurrent major depression (HCC) 04/19/2020   Multiple closed fractures of ribs of left side 06/03/2020   Formatting of this note might be different from the original. Added automatically from request for surgery [id number]   Neuropathy 01/31/2020   Obsessive-compulsive disorder 11/07/2013   Other osteoporosis without current pathological fracture    Peripheral  edema 11/01/2020   Polypharmacy 06/12/2020   Portal hypertension (HCC)    Post-surgical hypothyroidism 12/10/2015   Primary insomnia 03/26/2021   Severe protein-calorie malnutrition (HCC) 03/26/2021   Shortness of breath 11/01/2020   Stage 3b chronic kidney disease (HCC) 04/16/2021  Stroke [facility])     left basal ganglia stroke (hemorrhagic)   Telogen effluvium 03/26/2021   Ulcer of left foot with fat layer exposed (HCC) 10/04/2020   Ulcer of right foot, with fat layer exposed (HCC) 10/10/2020   Vitamin D  deficiency    Past Surgical History:  Procedure Laterality Date   BREAST EXCISIONAL BIOPSY Left    CATARACT EXTRACTION     GASTRIC BYPASS  2005   HEMORRHOID SURGERY     HERNIA REPAIR     metatarsus abductus surgery Left 1990   PARTIAL HYSTERECTOMY  05/1997   BL Ovaries remain. Done for fibroids.   THYROIDECTOMY  03/2013    Family History  Problem Relation Age of Onset   Breast cancer Mother    Osteoporosis Mother    Cancer Mother        Breast, lung, skin.    Breast cancer Maternal Grandmother    Social History   Socioeconomic History   Marital status: Married    Spouse name: Not on file   Number of children: 1   Years of education: Not on file   Highest education level: Associate degree: occupational, Scientist, product/process development, or vocational program  Occupational History   Occupation: IT trainer    Comment: Retired.  Tobacco Use   Smoking status: Never   Smokeless tobacco: Never  Substance and Sexual Activity   Alcohol use: Not Currently    Comment: history of alcoholism. sober since 2014.   Drug use: Never   Sexual activity: Yes  Other Topics Concern   Not on file  Social History Narrative   Right handed   Social Drivers of Health   Financial Resource Strain: Low Risk  (09/29/2023)   Overall Financial Resource Strain (CARDIA)    Difficulty of Paying Living Expenses: Not hard at all  Recent Concern: Financial Resource Strain - Medium Risk (09/07/2023)   Overall Financial Resource Strain  (CARDIA)    Difficulty of Paying Living Expenses: Somewhat hard  Food Insecurity: Low Risk  (10/20/2023)   Received from [facility]   Hunger Vital Sign    Worried About Running Out of Food in the Last Year: Never true    Ran Out of Food in the Last Year: Never true  Transportation Needs: No Transportation Needs (10/20/2023)   Received from [facility]    In the past 12 months, has lack of reliable transportation kept you from medical appointments, meetings, work or from getting things needed for daily living? : No  Physical Activity: Insufficiently Active (09/29/2023)   Exercise Vital Sign    Days of Exercise per Week: 2 days    Minutes of Exercise per Session: 30 min  Stress: No Stress Concern Present (09/29/2023)   [employer] of Occupational Health - Occupational Stress Questionnaire    Feeling of Stress : Only a little  Social Connections: Moderately Isolated (09/29/2023)   Social Connection and Isolation Panel [NHANES]    Frequency of Communication with Friends and Family: More than three times a week    Frequency of Social Gatherings with Friends and Family: Once a week    Attends Religious Services: Never    Database administrator or Organizations: No    Attends Banker Meetings: Never    Marital Status: Married    Objective:  There were no vitals taken for this visit.     10/29/2023    1:17 PM 10/29/2023   11:50 AM 10/29/2023   11:28 AM  BP/Weight  Systolic BP 111 109 113  Diastolic BP 69 71 65    Physical Exam  Diabetic Foot Exam - Simple   No data filed      Lab Results  Component Value Date   WBC 13.0 (H) 10/28/2023   HGB 7.7 (L) 10/28/2023   HCT 26.1 (L) 10/28/2023   PLT 311 10/28/2023   GLUCOSE 74 09/30/2023   CHOL 121 03/01/2023   TRIG 65 03/01/2023   HDL 68 03/01/2023   LDLCALC 39 03/01/2023   ALT 14 09/30/2023   AST 21 09/30/2023   NA 137 09/30/2023   K 4.3 09/30/2023   CL 97 09/30/2023   CREATININE 1.43  (H) 09/30/2023   BUN 31 (H) 09/30/2023   CO2 25 09/30/2023   TSH 30.000 (H) 05/06/2023      Assessment & Plan:  Gastroesophageal reflux disease without esophagitis  Chronic idiopathic constipation     No orders of the defined types were placed in this encounter.   No orders of the defined types were placed in this encounter.    Follow-up: No follow-ups on file.   I,Marla I Leal-Borjas,acting as a scribe for Mercy Stall, MD.,have documented all relevant documentation on the behalf of Mercy Stall, MD,as directed by  Mercy Stall, MD while in the presence of Mercy Stall, MD.   An After Visit Summary was printed and given to the patient.  Mercy Stall, MD [facility] [phone]

## 2023-11-09 ENCOUNTER — Encounter: Payer: Self-pay | Admitting: Family Medicine

## 2023-11-09 ENCOUNTER — Ambulatory Visit (INDEPENDENT_AMBULATORY_CARE_PROVIDER_SITE_OTHER): Admitting: Family Medicine

## 2023-11-09 VITALS — BP 114/84 | HR 69 | Temp 97.8°F | Ht 60.0 in | Wt 154.0 lb

## 2023-11-09 DIAGNOSIS — K5904 Chronic idiopathic constipation: Secondary | ICD-10-CM

## 2023-11-09 DIAGNOSIS — S41111A Laceration without foreign body of right upper arm, initial encounter: Secondary | ICD-10-CM | POA: Diagnosis not present

## 2023-11-09 DIAGNOSIS — E892 Postprocedural hypoparathyroidism: Secondary | ICD-10-CM | POA: Diagnosis not present

## 2023-11-09 DIAGNOSIS — K219 Gastro-esophageal reflux disease without esophagitis: Secondary | ICD-10-CM

## 2023-11-09 DIAGNOSIS — N179 Acute kidney failure, unspecified: Secondary | ICD-10-CM | POA: Diagnosis not present

## 2023-11-09 DIAGNOSIS — N1831 Chronic kidney disease, stage 3a: Secondary | ICD-10-CM | POA: Diagnosis not present

## 2023-11-09 DIAGNOSIS — K7030 Alcoholic cirrhosis of liver without ascites: Secondary | ICD-10-CM | POA: Diagnosis not present

## 2023-11-09 MED ORDER — MIDODRINE HCL 2.5 MG PO TABS: 2.5000 mg | ORAL_TABLET | Freq: Three times a day (TID) | ORAL | 0 refills | Status: DC

## 2023-11-09 NOTE — Telephone Encounter (Signed)
 Dr. Reinhold Carbine scheduled the patient an appointment for today.

## 2023-11-09 NOTE — Assessment & Plan Note (Signed)
 CLEANED WITH SALINE.  NONSTICK DRESSING APPLIED.  WRAPPED WITH COBAN  CONTINUE KEFLEX  RECOMMEND AVOID IODINE TO CLEAN.  CONTINUE TO USE ANTIBIOTIC OINTMENT OR VASELINE AND DRESS TWICE DAILY.

## 2023-11-16 ENCOUNTER — Ambulatory Visit (INDEPENDENT_AMBULATORY_CARE_PROVIDER_SITE_OTHER): Admitting: Family Medicine

## 2023-11-16 ENCOUNTER — Encounter: Payer: Self-pay | Admitting: Family Medicine

## 2023-11-16 VITALS — BP 102/64 | HR 86 | Temp 98.0°F | Resp 16 | Ht 60.0 in | Wt 154.6 lb

## 2023-11-16 DIAGNOSIS — S41111D Laceration without foreign body of right upper arm, subsequent encounter: Secondary | ICD-10-CM

## 2023-11-16 NOTE — Progress Notes (Signed)
 Subjective:  Patient ID: Molly Parrish, female    DOB: 05-08-1966  Age: 58 y.o. MRN: [id number]  Chief Complaint  Patient presents with   Suture / Staple Removal   Wound Check    HPI: Discussed the use of AI scribe software for clinical note transcription with the patient, who gave verbal consent to proceed.  History of Present Illness   Molly Parrish is a 58 year old female who presents for suture removal from her arm.  The area around the sutures is red, and she has been on antibiotics. She changes the dressing twice a day and notes greenish-yellow discharge from the wound.  She recalls a previous incident where she almost lost her leg after a fall, which required a balloon stint and a soft cast for a significant period. This past experience has made her concerned about the current wound's healing process.  She has been applying antibiotic ointment and is concerned about the wound's appearance, noting that it was bruised and red previously, but now appears less red and swollen. However, she is worried about a specific area in the center of the wound that has a yellowish-green color.  She is still taking antibiotics and is following the care instructions provided to her. She is concerned about the possibility of infection, given her past medical history and the current symptoms. No fever or other systemic symptoms.         05/07/2023   10:44 AM 03/01/2023    3:33 PM 06/15/2022   10:02 AM 12/05/2021   10:55 AM 11/14/2021   11:16 AM  Depression screen PHQ 2/9  Decreased Interest 0 0 0 0 0  Down, Depressed, Hopeless 0 0 0 0 0  PHQ - 2 Score 0 0 0 0 0  Altered sleeping  0 3    Tired, decreased energy  3 3    Change in appetite  0 0    Feeling bad or failure about yourself   0 0    Trouble concentrating  0 0    Moving slowly or fidgety/restless   1    Suicidal thoughts  0 0    PHQ-9 Score  3 7    Difficult doing work/chores  Not difficult at all Somewhat  difficult          05/07/2023   10:44 AM  Fall Risk   Falls in the past year? 1  Number falls in past yr: 1  Injury with Fall? 1    Patient Care Team: Mercy Stall, MD as PCP - General (Family Medicine) Drury Geralds., MD (Endocrinology) Katy Parry, MD as Referring Physician (Geriatric Medicine) Center, [facility] Leobardo Rainier, MD as Referring Physician (Psychiatry) Sherolyn Dixon, DPM (Inactive) as Referring Physician (Podiatry) Deloria Fetch, MD as Consulting Physician (Oncology) Geraldyne Kluver, MD as Referring Physician (Nephrology)   Review of Systems  Constitutional:  Negative for chills, diaphoresis, fatigue and fever.  HENT:  Negative for congestion, ear pain and sinus pain.   Eyes: Negative.   Respiratory:  Negative for cough and shortness of breath.   Cardiovascular:  Negative for chest pain.  Gastrointestinal:  Negative for abdominal pain, constipation, nausea and vomiting.  Endocrine: Negative.   Genitourinary:  Negative for dysuria.  Musculoskeletal:  Negative for arthralgias.  Allergic/Immunologic: Negative.   Neurological:  Negative for weakness and headaches.  Hematological: Negative.   Psychiatric/Behavioral:  Negative for dysphoric mood. The patient is not nervous/anxious.     Current  Outpatient Medications on File Prior to Visit  Medication Sig Dispense Refill   acetaminophen  (TYLENOL ) 325 MG tablet Take by mouth.     ALPRAZolam  (XANAX ) 0.25 MG tablet Take 1 tablet by mouth twice daily as needed for anxiety 60 tablet 0   budesonide -formoterol  (SYMBICORT ) 160-4.5 MCG/ACT inhaler Inhale 2 puffs into the lungs 2 (two) times daily. 10.2 g 2   calcitRIOL (ROCALTROL) 0.5 MCG capsule Take 0.5 mcg by mouth 2 (two) times daily.     calcium carbonate (OSCAL) 1500 (600 Ca) MG TABS tablet Take 3,600 mg by mouth 2 (two) times daily with a meal.     CONSTULOSE 10 GM/15ML solution Take 20 g by mouth 2 (two) times daily.     Cyanocobalamin  (VITAMIN B  12 PO) Take 1,000 mcg by mouth daily.     cyclobenzaprine  (FLEXERIL ) 10 MG tablet Take 1 tablet by mouth three times daily as needed for muscle spasm 90 tablet 1   famotidine  (PEPCID ) 40 MG tablet Take 40 mg by mouth at bedtime.     furosemide  (LASIX ) 40 MG tablet Take 2 tablets (80 mg total) by mouth 2 (two) times daily. 360 tablet 0   gabapentin  (NEURONTIN ) 100 MG capsule Take 1 capsule (100 mg total) by mouth 3 (three) times daily. 90 capsule 5   Glucagon  (GVOKE HYPOPEN  2-PACK) 0.5 MG/0.1ML SOAJ If glucose less than 60, use gvoke pen x 1. May repeat if not responding x 1 daily. Call EMS if not improving. 0.2 mL 2   hydrOXYzine  (VISTARIL ) 25 MG capsule TAKE 1 CAPSULE BY MOUTH THREE TIMES DAILY 90 capsule 5   Iron -Vitamins (GERITOL COMPLETE) TABS Take 1 tablet by mouth daily.     lamoTRIgine (LAMICTAL) 200 MG tablet Take 200 mg by mouth at bedtime.     levothyroxine  (SYNTHROID ) 150 MCG tablet TAKE 1 TABLET BY MOUTH ONCE DAILY BEFORE  BREAKFAST 90 tablet 0   magnesium oxide (MAG-OX) 400 MG tablet Take 400 mg by mouth daily.     midodrine  (PROAMATINE ) 2.5 MG tablet Take 1 tablet (2.5 mg total) by mouth 3 (three) times daily with meals. 90 tablet 0   ondansetron  (ZOFRAN ) 4 MG tablet TAKE 1 TABLET BY MOUTH EVERY 8 HOURS AS NEEDED FOR NAUSEA AND VOMITING 60 tablet 1   pantoprazole  (PROTONIX ) 40 MG tablet TAKE 1 TABLET(40 MG) BY MOUTH DAILY 90 tablet 0   propranolol (INDERAL) 10 MG tablet Take 5 mg by mouth at bedtime.     QUEtiapine (SEROQUEL) 50 MG tablet Take 100 mg by mouth at bedtime.     sertraline (ZOLOFT) 100 MG tablet Take 100 mg by mouth in the morning and at bedtime.     spironolactone  (ALDACTONE ) 50 MG tablet Take 2 tablets by mouth twice daily 120 tablet 3   thiamine 100 MG tablet Take 100 mg by mouth daily.     traMADol  (ULTRAM ) 50 MG tablet TAKE 2 TABLETS BY MOUTH TWICE DAILY (Patient taking differently: Take 100 mg by mouth 2 (two) times daily as needed for severe pain (pain score  7-10).) 120 tablet 1   triamcinolone  (KENALOG ) 0.1 % paste USE AS DIRECTED IN  MOUTH  OR  THROAT  TWICE  DAILY. 5 g 0   Vitamin D , Ergocalciferol , (DRISDOL ) 1.25 MG (50000 UNIT) CAPS capsule Take 1 capsule (50,000 Units total) by mouth 2 (two) times a week. 24 capsule 0   zolpidem  (AMBIEN ) 10 MG tablet TAKE 1 TABLET BY MOUTH AT BEDTIME 30 tablet 0  promethazine (PHENERGAN) 25 MG tablet TAKE 1 TABLET BY MOUTH FOUR TIMES DAILY AS NEEDED FOR NAUSEA OR VOMITING (Patient not taking: Reported on 11/16/2023) 40 tablet 2   No current facility-administered medications on file prior to visit.   Past Medical History:  Diagnosis Date   Alcohol dependence in remission (HCC) 10/04/2013   Alcoholic cirrhosis of liver (HCC)    Anemia    Anemia of chronic disease 08/08/2016   Arterial hypotension 05/08/2020   Bipolar disorder current episode depressed (HCC)    Brain TIA 02/19/2020   CKD (chronic kidney disease)    Episodic mood disorder (HCC) 11/07/2013   Generalized anxiety disorder    Generalized weakness 07/11/2020   GERD (gastroesophageal reflux disease)    H/O bariatric surgery 06/11/2020   Hypocalcemia 12/10/2015   Hypokalemia    Hyponatremia 02/19/2020   Intestinal malabsorption    Iron  deficiency anemia due to chronic blood loss 05/08/2020   Korsakoff syndrome (HCC)    Lumbar disc disease 10/19/2019   Microscopic hematuria 04/16/2021   Migraine 04/20/2014   Mild recurrent major depression (HCC) 04/19/2020   Multiple closed fractures of ribs of left side 06/03/2020   Formatting of this note might be different from the original. Added automatically from request for surgery [id number]   Neuropathy 01/31/2020   Obsessive-compulsive disorder 11/07/2013   Other osteoporosis without current pathological fracture    Peripheral edema 11/01/2020   Polypharmacy 06/12/2020   Portal hypertension (HCC)    Post-surgical hypothyroidism 12/10/2015   Primary insomnia 03/26/2021   Severe protein-calorie malnutrition (HCC)  03/26/2021   Shortness of breath 11/01/2020   Stage 3b chronic kidney disease (HCC) 04/16/2021   Stroke (HCC)     left basal ganglia stroke (hemorrhagic)   Telogen effluvium 03/26/2021   Ulcer of left foot with fat layer exposed (HCC) 10/04/2020   Ulcer of right foot, with fat layer exposed (HCC) 10/10/2020   Vitamin D  deficiency    Past Surgical History:  Procedure Laterality Date   BREAST EXCISIONAL BIOPSY Left    CATARACT EXTRACTION     GASTRIC BYPASS  2005   HEMORRHOID SURGERY     HERNIA REPAIR     metatarsus abductus surgery Left 1990   PARTIAL HYSTERECTOMY  05/1997   BL Ovaries remain. Done for fibroids.   THYROIDECTOMY  03/2013    Family History  Problem Relation Age of Onset   Breast cancer Mother    Osteoporosis Mother    Cancer Mother        Breast, lung, skin.    Breast cancer Maternal Grandmother    Social History   Socioeconomic History   Marital status: Married    Spouse name: Not on file   Number of children: 1   Years of education: Not on file   Highest education level: Associate degree: occupational, Scientist, product/process development, or vocational program  Occupational History   Occupation: IT trainer    Comment: Retired.  Tobacco Use   Smoking status: Never   Smokeless tobacco: Never  Substance and Sexual Activity   Alcohol use: Not Currently    Comment: history of alcoholism. sober since 2014.   Drug use: Never   Sexual activity: Yes  Other Topics Concern   Not on file  Social History Narrative   Right handed   Social Drivers of Health   Financial Resource Strain: Low Risk  (09/29/2023)   Overall Financial Resource Strain (CARDIA)    Difficulty of Paying Living Expenses: Not hard at all  Recent Concern: Financial  Resource Strain - Medium Risk (09/07/2023)   Overall Financial Resource Strain (CARDIA)    Difficulty of Paying Living Expenses: Somewhat hard  Food Insecurity: Low Risk  (10/20/2023)   Received from [facility]   Hunger Vital Sign    Worried About Running Out of  Food in the Last Year: Never true    Ran Out of Food in the Last Year: Never true  Transportation Needs: No Transportation Needs (10/20/2023)   Received from [facility]    In the past 12 months, has lack of reliable transportation kept you from medical appointments, meetings, work or from getting things needed for daily living? : No  Physical Activity: Insufficiently Active (09/29/2023)   Exercise Vital Sign    Days of Exercise per Week: 2 days    Minutes of Exercise per Session: 30 min  Stress: No Stress Concern Present (09/29/2023)   [employer] of Occupational Health - Occupational Stress Questionnaire    Feeling of Stress : Only a little  Social Connections: Moderately Isolated (09/29/2023)   Social Connection and Isolation Panel [NHANES]    Frequency of Communication with Friends and Family: More than three times a week    Frequency of Social Gatherings with Friends and Family: Once a week    Attends Religious Services: Never    Database administrator or Organizations: No    Attends Engineer, structural: Never    Marital Status: Married    Objective:  BP 102/64   Pulse 86   Temp 98 F (36.7 C) (Temporal)   Resp 16   Ht 5' (1.524 m)   Wt 154 lb 9.6 oz (70.1 kg)   SpO2 95%   BMI 30.19 kg/m      11/16/2023   10:02 AM 11/09/2023    7:42 AM 10/29/2023    1:17 PM  BP/Weight  Systolic BP 102 114 111  Diastolic BP 64 84 69  Wt. (Lbs) 154.6 154   BMI 30.19 kg/m2 30.08 kg/m2     Physical Exam Vitals reviewed.  Constitutional:      General: She is not in acute distress.    Appearance: Normal appearance.  Eyes:     Conjunctiva/sclera: Conjunctivae normal.  Cardiovascular:     Rate and Rhythm: Normal rate and regular rhythm.     Heart sounds: Normal heart sounds. No murmur heard. Pulmonary:     Effort: Pulmonary effort is normal.     Breath sounds: Normal breath sounds. No wheezing.  Musculoskeletal:     Cervical back: Normal range of  motion.  Skin:    Findings: Wound present.     Comments: Sutures removed  Neurological:     Mental Status: She is alert. Mental status is at baseline.  Psychiatric:        Mood and Affect: Mood normal.        Behavior: Behavior normal.     Lab Results  Component Value Date   WBC 13.0 (H) 10/28/2023   HGB 7.7 (L) 10/28/2023   HCT 26.1 (L) 10/28/2023   PLT 311 10/28/2023   GLUCOSE 74 09/30/2023   CHOL 121 03/01/2023   TRIG 65 03/01/2023   HDL 68 03/01/2023   LDLCALC 39 03/01/2023   ALT 14 09/30/2023   AST 21 09/30/2023   NA 137 09/30/2023   K 4.3 09/30/2023   CL 97 09/30/2023   CREATININE 1.43 (H) 09/30/2023   BUN 31 (H) 09/30/2023   CO2 25 09/30/2023  TSH 30.000 (H) 05/06/2023   Assessment & Plan:  Skin tear of right upper arm without complication, subsequent encounter Assessment & Plan: Arm wound shows some discharge and erythema. Bruising improved. Concern for infection persists despite antibiotics due to wound depth. - Continue current antibiotic regimen. - Apply 4x4 saline-soaked gauze after suture removal. - Reassess wound after 10 minutes of soaking for debridement - sutures removed and debridement of dead tissue- tolerated well with no complications - dressed after procedure with tefla, triple antibiotic ointment, gauze and kurlex - FU as needed for signs of infection          Follow-up: Return if symptoms worsen or fail to improve.   An After Visit Summary was printed and given to the patient.  Delford Felling, FNP [facility] [phone]

## 2023-11-16 NOTE — Assessment & Plan Note (Addendum)
 Arm wound shows some discharge and erythema. Bruising improved. Concern for infection persists despite antibiotics due to wound depth. - Continue current antibiotic regimen. - Apply 4x4 saline-soaked gauze after suture removal. - Reassess wound after 10 minutes of soaking for debridement - sutures removed and debridement of dead tissue- tolerated well with no complications - dressed after procedure with tefla, triple antibiotic ointment, gauze and kurlex - FU as needed for signs of infection

## 2023-11-17 DIAGNOSIS — D539 Nutritional anemia, unspecified: Secondary | ICD-10-CM | POA: Diagnosis not present

## 2023-11-17 DIAGNOSIS — Z79899 Other long term (current) drug therapy: Secondary | ICD-10-CM | POA: Diagnosis not present

## 2023-11-17 DIAGNOSIS — K7682 Hepatic encephalopathy: Secondary | ICD-10-CM | POA: Diagnosis not present

## 2023-11-17 DIAGNOSIS — N390 Urinary tract infection, site not specified: Secondary | ICD-10-CM | POA: Diagnosis not present

## 2023-11-17 DIAGNOSIS — N1832 Chronic kidney disease, stage 3b: Secondary | ICD-10-CM | POA: Diagnosis not present

## 2023-11-17 DIAGNOSIS — R300 Dysuria: Secondary | ICD-10-CM | POA: Diagnosis not present

## 2023-11-17 DIAGNOSIS — Z6826 Body mass index (BMI) 26.0-26.9, adult: Secondary | ICD-10-CM | POA: Diagnosis not present

## 2023-11-17 DIAGNOSIS — A499 Bacterial infection, unspecified: Secondary | ICD-10-CM | POA: Diagnosis not present

## 2023-11-17 DIAGNOSIS — K7469 Other cirrhosis of liver: Secondary | ICD-10-CM | POA: Diagnosis not present

## 2023-11-19 ENCOUNTER — Inpatient Hospital Stay: Admitting: Family Medicine

## 2023-11-23 ENCOUNTER — Inpatient Hospital Stay: Attending: Oncology

## 2023-11-23 ENCOUNTER — Encounter: Payer: Self-pay | Admitting: Family Medicine

## 2023-11-23 ENCOUNTER — Inpatient Hospital Stay

## 2023-11-23 VITALS — BP 98/63 | HR 88 | Temp 98.0°F | Resp 18

## 2023-11-23 DIAGNOSIS — Z79899 Other long term (current) drug therapy: Secondary | ICD-10-CM | POA: Diagnosis not present

## 2023-11-23 DIAGNOSIS — D508 Other iron deficiency anemias: Secondary | ICD-10-CM

## 2023-11-23 DIAGNOSIS — D72829 Elevated white blood cell count, unspecified: Secondary | ICD-10-CM | POA: Diagnosis not present

## 2023-11-23 DIAGNOSIS — N1831 Chronic kidney disease, stage 3a: Secondary | ICD-10-CM | POA: Diagnosis not present

## 2023-11-23 DIAGNOSIS — N189 Chronic kidney disease, unspecified: Secondary | ICD-10-CM

## 2023-11-23 DIAGNOSIS — D631 Anemia in chronic kidney disease: Secondary | ICD-10-CM | POA: Diagnosis not present

## 2023-11-23 LAB — CBC WITH DIFFERENTIAL (CANCER CENTER ONLY)
Abs Immature Granulocytes: 0.2 10*3/uL — ABNORMAL HIGH (ref 0.00–0.07)
Basophils Absolute: 0.1 10*3/uL (ref 0.0–0.1)
Basophils Relative: 1 %
Eosinophils Absolute: 0.3 10*3/uL (ref 0.0–0.5)
Eosinophils Relative: 2 %
HCT: 28.5 % — ABNORMAL LOW (ref 36.0–46.0)
Hemoglobin: 9.1 g/dL — ABNORMAL LOW (ref 12.0–15.0)
Immature Granulocytes: 1 %
Lymphocytes Relative: 14 %
Lymphs Abs: 2 10*3/uL (ref 0.7–4.0)
MCH: 30.2 pg (ref 26.0–34.0)
MCHC: 31.9 g/dL (ref 30.0–36.0)
MCV: 94.7 fL (ref 80.0–100.0)
Monocytes Absolute: 1 10*3/uL (ref 0.1–1.0)
Monocytes Relative: 7 %
Neutro Abs: 10.9 10*3/uL — ABNORMAL HIGH (ref 1.7–7.7)
Neutrophils Relative %: 75 %
Platelet Count: 367 10*3/uL (ref 150–400)
RBC: 3.01 MIL/uL — ABNORMAL LOW (ref 3.87–5.11)
RDW: 16.6 % — ABNORMAL HIGH (ref 11.5–15.5)
WBC Count: 14.7 10*3/uL — ABNORMAL HIGH (ref 4.0–10.5)
nRBC: 0 % (ref 0.0–0.2)
nRBC: 0 /100{WBCs}

## 2023-11-23 MED ORDER — EPOETIN ALFA-EPBX 40000 UNIT/ML IJ SOLN
40000.0000 [IU] | Freq: Once | INTRAMUSCULAR | Status: AC
  Administered 2023-11-23: 40000 [IU] via SUBCUTANEOUS
  Filled 2023-11-23: qty 1

## 2023-11-23 NOTE — Patient Instructions (Signed)

## 2023-11-24 NOTE — Telephone Encounter (Signed)
 Appointment made with Steele Edelson tomorrow

## 2023-11-24 NOTE — Progress Notes (Unsigned)
 Acute Office Visit  Subjective:    Patient ID: Molly Parrish, female    DOB: 21-Mar-1966, 58 y.o.   MRN: [id number]  Chief Complaint  Patient presents with   Urinary Tract Infection   Discussed the use of AI scribe software for clinical note transcription with the patient, who gave verbal consent to proceed.  History of Present Illness   Molly Parrish is a 58 year old female with recurrent urinary tract infections who presents with symptoms of a UTI and concerns about vaginal bleeding.  Dysuria: She was recently on antibiotics for her right arm wound following a fall. She experiences flank pain on both sides, worsening with deep breaths, and lower abdominal pain, particularly in the pelvic area. She recalls a previous episode of sepsis with fluid drained from her bladder. She has feverish episodes, feeling hot and then chilly, and takes OTC medication to manage these symptoms. She experiences dysuria, urgency, and frequency of urination, but no burning sensation.She has a history of developing yeast infections with antibiotic use, although no yeast infections are reported currently.   Vaginal bleeding: She noticed blood on a panty liner at the beginning of the week, which has not recurred. She is not on hormone therapy, does not have a gynecologist, experienced menopause early at age 3, and had a partial hysterectomy.  Osteoporosis: She has a history of multiple fractures, including breaking her arm three times, each foot, and ribs on the left side. Her fractures take a long time to heal, and she has metal plates in her feet. Her orthopedic doctor has informed her that her bones are in poor condition, comparable to those of much older individuals. She has a family history of osteoporosis, with her mother and grandmother both affected. She would like a DEXA scan ordered. Her orthopedic doctor has informed her of hairline cracks in her arm, requiring her to wear a sling  frequently. She has had collarbone surgery in the past, limiting her range of motion.   She reports pain behind her ear, which is swollen and tender, with no recent trauma to the area.      Past Medical History:  Diagnosis Date   Alcohol dependence in remission (HCC) 10/04/2013   Alcoholic cirrhosis of liver (HCC)    Anemia    Anemia of chronic disease 08/08/2016   Arterial hypotension 05/08/2020   Bipolar disorder current episode depressed (HCC)    Brain TIA 02/19/2020   CKD (chronic kidney disease)    Episodic mood disorder (HCC) 11/07/2013   Generalized anxiety disorder    Generalized weakness 07/11/2020   GERD (gastroesophageal reflux disease)    H/O bariatric surgery 06/11/2020   Hypocalcemia 12/10/2015   Hypokalemia    Hyponatremia 02/19/2020   Intestinal malabsorption    Iron  deficiency anemia due to chronic blood loss 05/08/2020   Korsakoff syndrome (HCC)    Lumbar disc disease 10/19/2019   Microscopic hematuria 04/16/2021   Migraine 04/20/2014   Mild recurrent major depression (HCC) 04/19/2020   Multiple closed fractures of ribs of left side 06/03/2020   Formatting of this note might be different from the original. Added automatically from request for surgery [id number]   Neuropathy 01/31/2020   Obsessive-compulsive disorder 11/07/2013   Other osteoporosis without current pathological fracture    Peripheral edema 11/01/2020   Polypharmacy 06/12/2020   Portal hypertension (HCC)    Post-surgical hypothyroidism 12/10/2015   Primary insomnia 03/26/2021   Severe protein-calorie malnutrition (HCC) 03/26/2021   Shortness of breath  11/01/2020   Stage 3b chronic kidney disease (HCC) 04/16/2021   Stroke (HCC)     left basal ganglia stroke (hemorrhagic)   Telogen effluvium 03/26/2021   Ulcer of left foot with fat layer exposed (HCC) 10/04/2020   Ulcer of right foot, with fat layer exposed (HCC) 10/10/2020   Vitamin D  deficiency     Past Surgical History:  Procedure Laterality Date   BREAST  EXCISIONAL BIOPSY Left    CATARACT EXTRACTION     GASTRIC BYPASS  2005   HEMORRHOID SURGERY     HERNIA REPAIR     metatarsus abductus surgery Left 1990   PARTIAL HYSTERECTOMY  05/1997   BL Ovaries remain. Done for fibroids.   THYROIDECTOMY  03/2013    Family History  Problem Relation Age of Onset   Breast cancer Mother    Osteoporosis Mother    Cancer Mother        Breast, lung, skin.    Breast cancer Maternal Grandmother     Social History   Socioeconomic History   Marital status: Married    Spouse name: Not on file   Number of children: 1   Years of education: Not on file   Highest education level: Associate degree: occupational, Scientist, product/process development, or vocational program  Occupational History   Occupation: IT trainer    Comment: Retired.  Tobacco Use   Smoking status: Never   Smokeless tobacco: Never  Substance and Sexual Activity   Alcohol use: Not Currently    Comment: history of alcoholism. sober since 2014.   Drug use: Never   Sexual activity: Yes  Other Topics Concern   Not on file  Social History Narrative   Right handed   Social Drivers of Health   Financial Resource Strain: Low Risk  (09/29/2023)   Overall Financial Resource Strain (CARDIA)    Difficulty of Paying Living Expenses: Not hard at all  Recent Concern: Financial Resource Strain - Medium Risk (09/07/2023)   Overall Financial Resource Strain (CARDIA)    Difficulty of Paying Living Expenses: Somewhat hard  Food Insecurity: Low Risk  (10/20/2023)   Received from [facility]   Hunger Vital Sign    Worried About Running Out of Food in the Last Year: Never true    Ran Out of Food in the Last Year: Never true  Transportation Needs: No Transportation Needs (10/20/2023)   Received from [facility]    In the past 12 months, has lack of reliable transportation kept you from medical appointments, meetings, work or from getting things needed for daily living? : No  Physical Activity: Insufficiently  Active (09/29/2023)   Exercise Vital Sign    Days of Exercise per Week: 2 days    Minutes of Exercise per Session: 30 min  Stress: No Stress Concern Present (09/29/2023)   [employer] of Occupational Health - Occupational Stress Questionnaire    Feeling of Stress : Only a little  Social Connections: Moderately Isolated (09/29/2023)   Social Connection and Isolation Panel [NHANES]    Frequency of Communication with Friends and Family: More than three times a week    Frequency of Social Gatherings with Friends and Family: Once a week    Attends Religious Services: Never    Database administrator or Organizations: No    Attends Banker Meetings: Never    Marital Status: Married  Catering manager Violence: Not At Risk (05/20/2021)   Humiliation, Afraid, Rape, and Kick questionnaire    Fear  of Current or Ex-Partner: No    Emotionally Abused: No    Physically Abused: No    Sexually Abused: No    Outpatient Medications Prior to Visit  Medication Sig Dispense Refill   acetaminophen  (TYLENOL ) 325 MG tablet Take by mouth.     ALPRAZolam  (XANAX ) 0.25 MG tablet Take 1 tablet by mouth twice daily as needed for anxiety 60 tablet 0   budesonide -formoterol  (SYMBICORT ) 160-4.5 MCG/ACT inhaler Inhale 2 puffs into the lungs 2 (two) times daily. 10.2 g 2   calcitRIOL (ROCALTROL) 0.5 MCG capsule Take 0.5 mcg by mouth 2 (two) times daily.     calcium carbonate (OSCAL) 1500 (600 Ca) MG TABS tablet Take 3,600 mg by mouth 2 (two) times daily with a meal.     CONSTULOSE 10 GM/15ML solution Take 20 g by mouth 2 (two) times daily.     Cyanocobalamin  (VITAMIN B 12 PO) Take 1,000 mcg by mouth daily.     cyclobenzaprine  (FLEXERIL ) 10 MG tablet Take 1 tablet by mouth three times daily as needed for muscle spasm 90 tablet 1   doxycycline  (VIBRAMYCIN ) 50 MG capsule Take 50 mg by mouth 2 (two) times daily.     famotidine  (PEPCID ) 40 MG tablet Take 40 mg by mouth at bedtime.     folic acid  (FOLVITE )  800 MCG tablet Take 800 mcg by mouth daily.     gabapentin  (NEURONTIN ) 100 MG capsule Take 1 capsule (100 mg total) by mouth 3 (three) times daily. 90 capsule 5   Glucagon  (GVOKE HYPOPEN  2-PACK) 0.5 MG/0.1ML SOAJ If glucose less than 60, use gvoke pen x 1. May repeat if not responding x 1 daily. Call EMS if not improving. 0.2 mL 2   hydrOXYzine  (VISTARIL ) 25 MG capsule TAKE 1 CAPSULE BY MOUTH THREE TIMES DAILY 90 capsule 5   Iron -Vitamins (GERITOL COMPLETE) TABS Take 1 tablet by mouth daily.     lamoTRIgine (LAMICTAL) 200 MG tablet Take 200 mg by mouth at bedtime.     levothyroxine  (SYNTHROID ) 150 MCG tablet TAKE 1 TABLET BY MOUTH ONCE DAILY BEFORE  BREAKFAST 90 tablet 0   magnesium oxide (MAG-OX) 400 MG tablet Take 400 mg by mouth daily.     midodrine  (PROAMATINE ) 2.5 MG tablet Take 1 tablet (2.5 mg total) by mouth 3 (three) times daily with meals. 90 tablet 0   ondansetron  (ZOFRAN ) 4 MG tablet TAKE 1 TABLET BY MOUTH EVERY 8 HOURS AS NEEDED FOR NAUSEA AND VOMITING 60 tablet 1   pantoprazole  (PROTONIX ) 40 MG tablet TAKE 1 TABLET(40 MG) BY MOUTH DAILY 90 tablet 0   propranolol (INDERAL) 10 MG tablet Take 5 mg by mouth at bedtime.     QUEtiapine (SEROQUEL) 50 MG tablet Take 100 mg by mouth at bedtime.     sertraline (ZOLOFT) 100 MG tablet Take 100 mg by mouth in the morning and at bedtime.     spironolactone  (ALDACTONE ) 50 MG tablet Take 2 tablets by mouth twice daily 120 tablet 3   thiamine 100 MG tablet Take 100 mg by mouth daily.     traMADol  (ULTRAM ) 50 MG tablet TAKE 2 TABLETS BY MOUTH TWICE DAILY (Patient taking differently: Take 100 mg by mouth 2 (two) times daily as needed for severe pain (pain score 7-10).) 120 tablet 1   triamcinolone  (KENALOG ) 0.1 % paste USE AS DIRECTED IN  MOUTH  OR  THROAT  TWICE  DAILY. 5 g 0   Vitamin D , Ergocalciferol , (DRISDOL ) 1.25 MG (50000 UNIT) CAPS capsule  Take 1 capsule (50,000 Units total) by mouth 2 (two) times a week. 24 capsule 0   zolpidem  (AMBIEN ) 10 MG  tablet TAKE 1 TABLET BY MOUTH AT BEDTIME 30 tablet 0   furosemide  (LASIX ) 40 MG tablet Take 2 tablets (80 mg total) by mouth 2 (two) times daily. (Patient not taking: Reported on 11/25/2023) 360 tablet 0   promethazine (PHENERGAN) 25 MG tablet TAKE 1 TABLET BY MOUTH FOUR TIMES DAILY AS NEEDED FOR NAUSEA OR VOMITING (Patient not taking: Reported on 11/25/2023) 40 tablet 2   No facility-administered medications prior to visit.    Allergies  Allergen Reactions   Cholestyramine     Rash and itching   Trintellix [Vortioxetine]     Review of Systems  Constitutional:  Negative for chills, diaphoresis, fatigue and fever.  HENT:  Negative for congestion, ear pain and sinus pain.   Eyes: Negative.   Respiratory:  Negative for cough and shortness of breath.   Cardiovascular:  Negative for chest pain.  Gastrointestinal:  Negative for abdominal pain, constipation, nausea and vomiting.  Endocrine: Negative.   Genitourinary:  Positive for dysuria, flank pain (bilateral), frequency, pelvic pain and urgency.  Musculoskeletal:  Negative for arthralgias.  Skin: Negative.   Allergic/Immunologic: Negative.   Neurological:  Negative for weakness and headaches.  Hematological: Negative.   Psychiatric/Behavioral:  Negative for dysphoric mood. The patient is not nervous/anxious.        Objective:        11/25/2023   10:38 AM 11/23/2023   11:03 AM 11/16/2023   10:02 AM  Vitals with BMI  Height 5\' 0"   5\' 0"   Weight 154 lbs 10 oz  154 lbs 10 oz  BMI 30.19  30.19  Systolic 108 98 102  Diastolic 64 63 64  Pulse 98 88 86    No data found.   Physical Exam Vitals reviewed.  Constitutional:      General: She is not in acute distress.    Appearance: Normal appearance. She is not ill-appearing.  HENT:     Head:      Comments: Tenderness behind the left ear    Right Ear: Tympanic membrane normal.     Left Ear: Tympanic membrane normal.  Eyes:     Conjunctiva/sclera: Conjunctivae normal.   Cardiovascular:     Rate and Rhythm: Normal rate and regular rhythm.     Heart sounds: Normal heart sounds. No murmur heard. Pulmonary:     Effort: Pulmonary effort is normal.     Breath sounds: Normal breath sounds. No wheezing.  Abdominal:     Comments: Bilateral flank tenderness  Skin:    General: Skin is warm.  Neurological:     Mental Status: She is alert. Mental status is at baseline.  Psychiatric:        Mood and Affect: Mood normal.        Behavior: Behavior normal.     Health Maintenance Due  Topic Date Due   Medicare Annual Wellness (AWV)  Never done   MAMMOGRAM  09/18/2023    There are no preventive care reminders to display for this patient.   Lab Results  Component Value Date   TSH 30.000 (H) 05/06/2023   Lab Results  Component Value Date   WBC 14.7 (H) 11/23/2023   HGB 9.1 (L) 11/23/2023   HCT 28.5 (L) 11/23/2023   MCV 94.7 11/23/2023   PLT 367 11/23/2023   Lab Results  Component Value Date   NA 137 09/30/2023  K 4.3 09/30/2023   CO2 25 09/30/2023   GLUCOSE 74 09/30/2023   BUN 31 (H) 09/30/2023   CREATININE 1.43 (H) 09/30/2023   BILITOT 0.3 09/30/2023   ALKPHOS 267 (H) 09/30/2023   AST 21 09/30/2023   ALT 14 09/30/2023   PROT 5.5 (L) 09/30/2023   ALBUMIN 2.6 (L) 09/30/2023   CALCIUM 8.8 09/30/2023   ANIONGAP 10 05/13/2023   EGFR 42 (L) 09/30/2023   Lab Results  Component Value Date   CHOL 121 03/01/2023   Lab Results  Component Value Date   HDL 68 03/01/2023   Lab Results  Component Value Date   LDLCALC 39 03/01/2023   Lab Results  Component Value Date   TRIG 65 03/01/2023   Lab Results  Component Value Date   CHOLHDL 1.8 03/01/2023   No results found for: "HGBA1C"     Assessment & Plan:  Dysuria Assessment & Plan: UA - positive UTI Urine dipstick shows positive for nitrates, positive for leukocytes, and positive for urobilinogen   Orders: -     POCT URINALYSIS DIP (CLINITEK)  Vaginal bleeding Assessment &  Plan: Post-menopausal bleeding with no hormone therapy. Concern for underlying pathology. - Referred to gynecologist for evaluation.  Orders: -     Ambulatory referral to Gynecology  Acute cystitis without hematuria Assessment & Plan: Recurrent UTI with positive urine test. History of sepsis related to urinary issues. No antibiotic allergies. Yeast infections occur with antibiotic use. - Prescribed Macrobid  twice daily for 7 days. - Sent urine for culture. - Prescribed Diflucan  for potential yeast infection. - Advised to take Diflucan  in the morning and skip Seroquel that night.  Orders: -     Urine Culture -     Nitrofurantoin  Monohyd Macro; Take 1 capsule (100 mg total) by mouth 2 (two) times daily for 7 days.  Dispense: 14 capsule; Refill: 0  Vaginal itching Assessment & Plan: Previous side effect of antibiotics. - order diflucan  prophylaxis   Orders: -     Fluconazole ; Take 1 tablet (150 mg total) by mouth daily for 1 dose.  Dispense: 1 tablet; Refill: 0  Localized osteoporosis without current pathological fracture Assessment & Plan: Osteoporosis with multiple fractures and family history. Fractures heal slowly. - Schedule DEXA scan in August 2025, order placed   Pain behind the ear, left Assessment & Plan: Pain and swelling behind ear with redness in canal. Tympanic membrane not significantly affected. - Advised use of Debrox to loosen ear wax. - try ice or heat for discomfort - Offered ear drops if pain worsens.   Encounter for screening mammogram for malignant neoplasm of breast Assessment & Plan: Due for mammogram and annual wellness visit. - Scheduled mammogram. - Scheduled annual wellness visit.       Meds ordered this encounter  Medications   nitrofurantoin , macrocrystal-monohydrate, (MACROBID ) 100 MG capsule    Sig: Take 1 capsule (100 mg total) by mouth 2 (two) times daily for 7 days.    Dispense:  14 capsule    Refill:  0   fluconazole  (DIFLUCAN )  150 MG tablet    Sig: Take 1 tablet (150 mg total) by mouth daily for 1 dose.    Dispense:  1 tablet    Refill:  0    Orders Placed This Encounter  Procedures   Urine Culture   Ambulatory referral to Gynecology   POCT URINALYSIS DIP (CLINITEK)      Follow-up: Return for awv, mammogram .  An After Visit Summary was  printed and given to the patient.  Total time spent on today's visit was 35 minutes, including both face-to-face time and nonface-to-face time personally spent on review of chart (labs), discussing labs, discussing further work-up, treatment options, referrals to specialist if needed, answering patient's questions, and coordinating care.    Delford Felling, FNP [facility] [phone]

## 2023-11-25 ENCOUNTER — Encounter: Payer: Self-pay | Admitting: Family Medicine

## 2023-11-25 ENCOUNTER — Ambulatory Visit: Admitting: Family Medicine

## 2023-11-25 VITALS — BP 108/64 | HR 98 | Temp 98.7°F | Resp 16 | Ht 60.0 in | Wt 154.6 lb

## 2023-11-25 DIAGNOSIS — H9202 Otalgia, left ear: Secondary | ICD-10-CM

## 2023-11-25 DIAGNOSIS — N939 Abnormal uterine and vaginal bleeding, unspecified: Secondary | ICD-10-CM

## 2023-11-25 DIAGNOSIS — N3000 Acute cystitis without hematuria: Secondary | ICD-10-CM

## 2023-11-25 DIAGNOSIS — Z1231 Encounter for screening mammogram for malignant neoplasm of breast: Secondary | ICD-10-CM

## 2023-11-25 DIAGNOSIS — R300 Dysuria: Secondary | ICD-10-CM

## 2023-11-25 DIAGNOSIS — M816 Localized osteoporosis [Lequesne]: Secondary | ICD-10-CM

## 2023-11-25 DIAGNOSIS — N898 Other specified noninflammatory disorders of vagina: Secondary | ICD-10-CM

## 2023-11-25 LAB — POCT URINALYSIS DIP (CLINITEK)
Blood, UA: NEGATIVE
Glucose, UA: NEGATIVE mg/dL
Ketones, POC UA: NEGATIVE mg/dL
Nitrite, UA: POSITIVE — AB
POC PROTEIN,UA: NEGATIVE
Spec Grav, UA: 1.015 (ref 1.010–1.025)
Urobilinogen, UA: 0.2 U/dL — AB
pH, UA: 6 (ref 5.0–8.0)

## 2023-11-25 MED ORDER — NITROFURANTOIN MONOHYD MACRO 100 MG PO CAPS: 100.0000 mg | ORAL_CAPSULE | Freq: Two times a day (BID) | ORAL | 0 refills | Status: DC

## 2023-11-25 MED ORDER — FLUCONAZOLE 150 MG PO TABS: 150.0000 mg | ORAL_TABLET | Freq: Every day | ORAL | 0 refills | Status: AC

## 2023-11-25 NOTE — Assessment & Plan Note (Signed)
 Post-menopausal bleeding with no hormone therapy. Concern for underlying pathology. - Referred to gynecologist for evaluation.

## 2023-11-25 NOTE — Assessment & Plan Note (Signed)
 Osteoporosis with multiple fractures and family history. Fractures heal slowly. - Schedule DEXA scan in August 2025, order placed

## 2023-11-25 NOTE — Assessment & Plan Note (Signed)
 Previous side effect of antibiotics. - order diflucan  prophylaxis

## 2023-11-25 NOTE — Assessment & Plan Note (Addendum)
 UA - positive UTI Urine dipstick shows positive for nitrates, positive for leukocytes, and positive for urobilinogen

## 2023-11-25 NOTE — Assessment & Plan Note (Signed)
 Pain and swelling behind ear with redness in canal. Tympanic membrane not significantly affected. - Advised use of Debrox to loosen ear wax. - try ice or heat for discomfort - Offered ear drops if pain worsens.

## 2023-11-25 NOTE — Assessment & Plan Note (Signed)
 Due for mammogram and annual wellness visit. - Scheduled mammogram. - Scheduled annual wellness visit.

## 2023-11-25 NOTE — Assessment & Plan Note (Signed)
 Recurrent UTI with positive urine test. History of sepsis related to urinary issues. No antibiotic allergies. Yeast infections occur with antibiotic use. - Prescribed Macrobid  twice daily for 7 days. - Sent urine for culture. - Prescribed Diflucan  for potential yeast infection. - Advised to take Diflucan  in the morning and skip Seroquel that night.

## 2023-11-26 ENCOUNTER — Other Ambulatory Visit: Payer: Self-pay | Admitting: Family Medicine

## 2023-11-28 ENCOUNTER — Other Ambulatory Visit: Payer: Self-pay

## 2023-11-28 ENCOUNTER — Other Ambulatory Visit: Payer: Self-pay | Admitting: Family Medicine

## 2023-11-28 DIAGNOSIS — S82009A Unspecified fracture of unspecified patella, initial encounter for closed fracture: Secondary | ICD-10-CM | POA: Insufficient documentation

## 2023-11-29 ENCOUNTER — Other Ambulatory Visit: Payer: Self-pay | Admitting: Family Medicine

## 2023-11-29 ENCOUNTER — Encounter: Payer: Self-pay | Admitting: Family Medicine

## 2023-11-29 LAB — URINE CULTURE

## 2023-11-29 MED ORDER — ZOLPIDEM TARTRATE 10 MG PO TABS: 10.0000 mg | ORAL_TABLET | Freq: Every day | ORAL | 2 refills | Status: DC

## 2023-11-29 MED ORDER — AMOXICILLIN-POT CLAVULANATE 875-125 MG PO TABS: 1.0000 | ORAL_TABLET | Freq: Two times a day (BID) | ORAL | 0 refills | Status: DC

## 2023-12-01 IMAGING — MG MM DIGITAL SCREENING BILAT W/ TOMO AND CAD
8 series · 9 of 24 positions shown · non-contrast
Comparison: Previous exam(s).

CLINICAL DATA: Screening.

EXAM:
DIGITAL SCREENING BILATERAL MAMMOGRAM WITH TOMOSYNTHESIS AND CAD
TECHNIQUE: Bilateral screening digital craniocaudal and mediolateral oblique
mammograms were obtained. Bilateral screening digital breast
tomosynthesis was performed. The images were evaluated with
computer-aided detection.

[R MLO synth-2D]
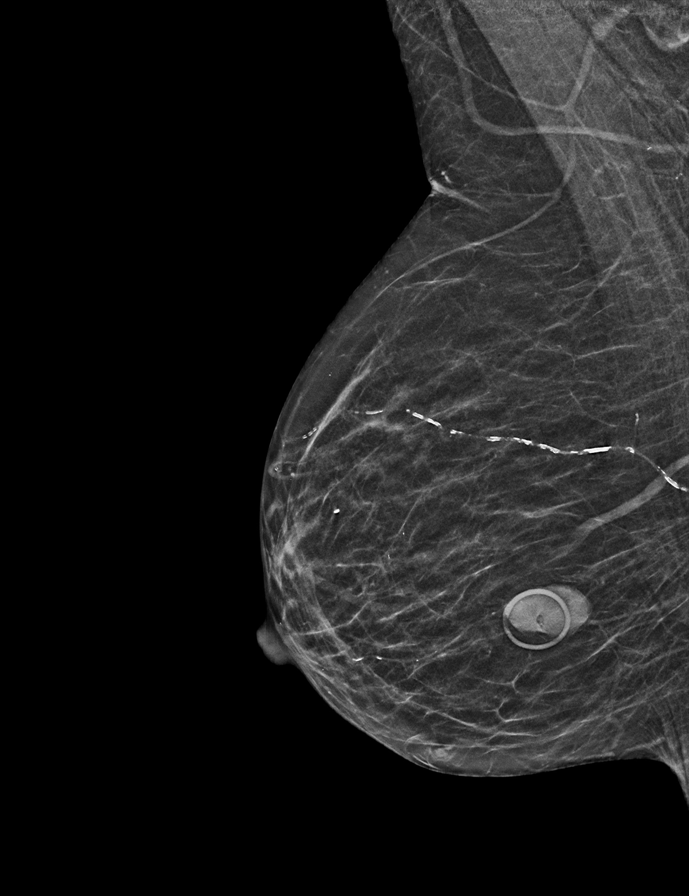

[R CC synth-2D]
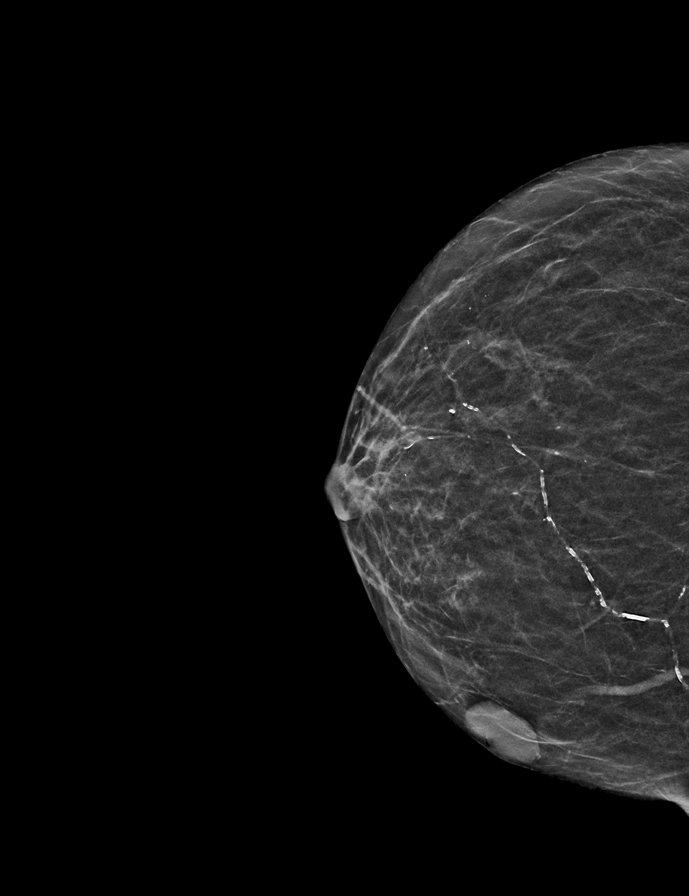

[L CC synth-2D]
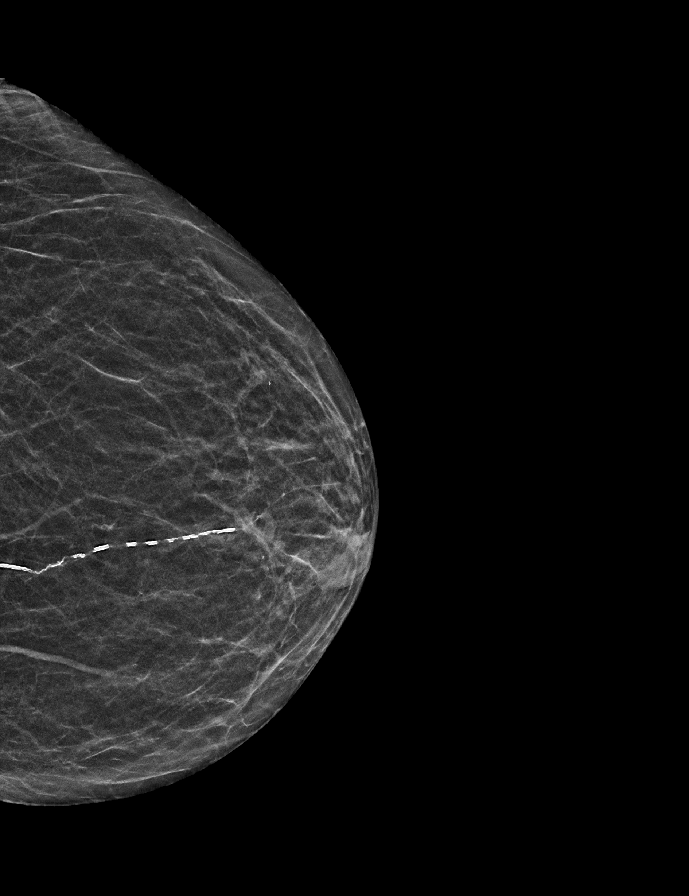

[L MLO synth-2D]
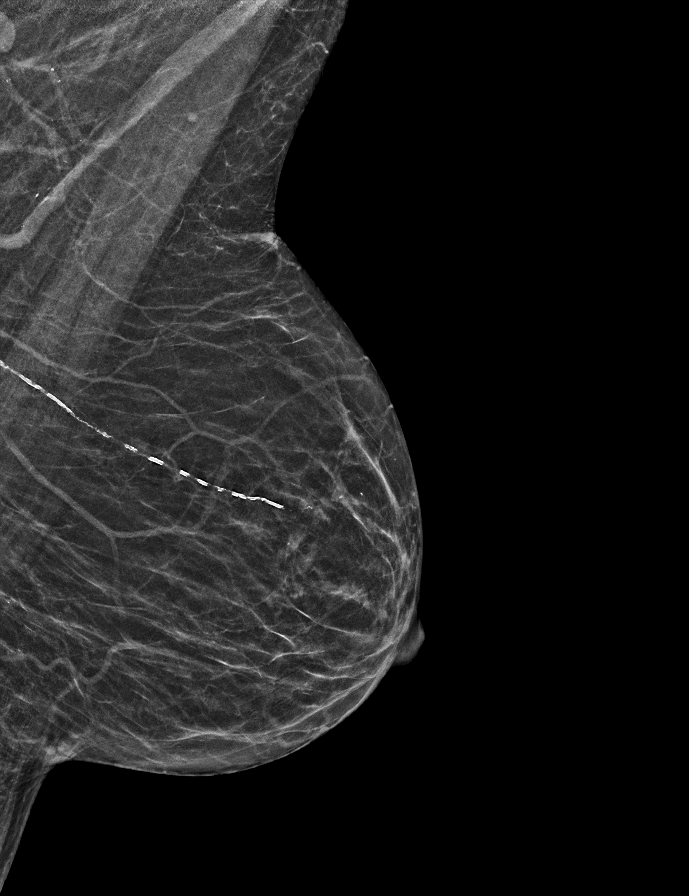

[R MLO tomo · 2 of 33 frames shown]
[frame 11/33]
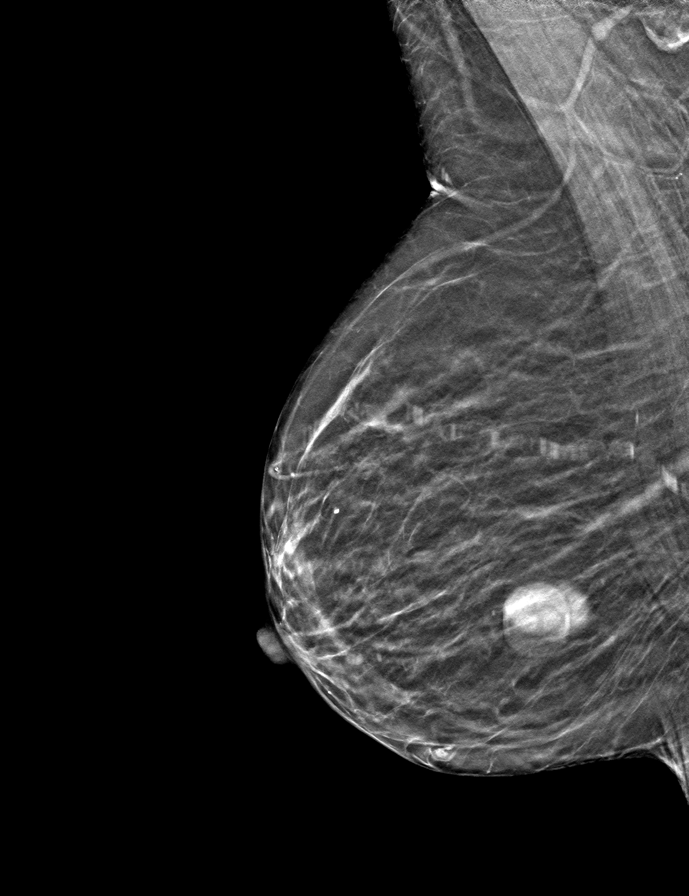
[frame 17/33]
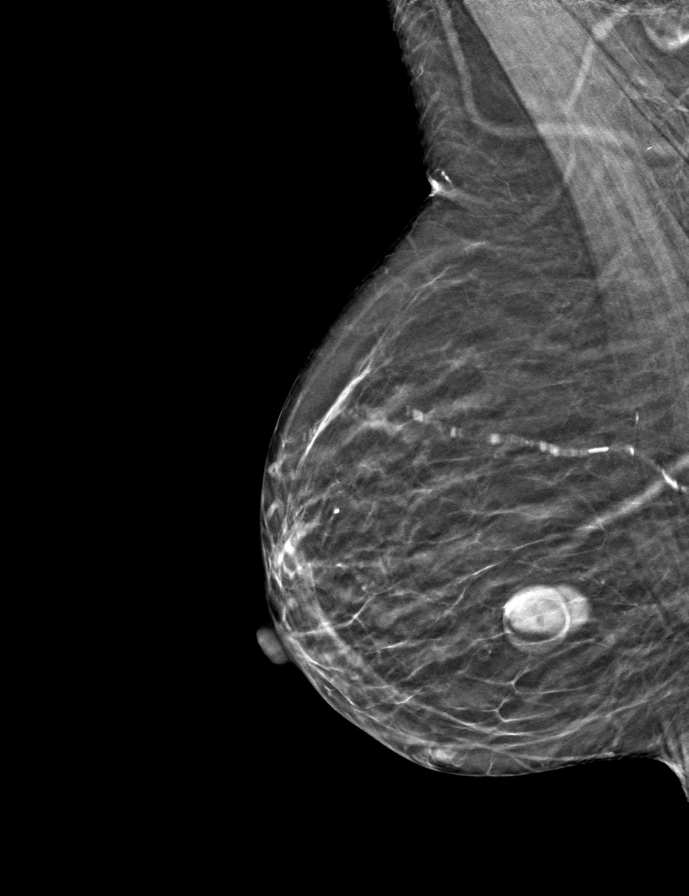

[L CC tomo · tomo slice 15/29.0]
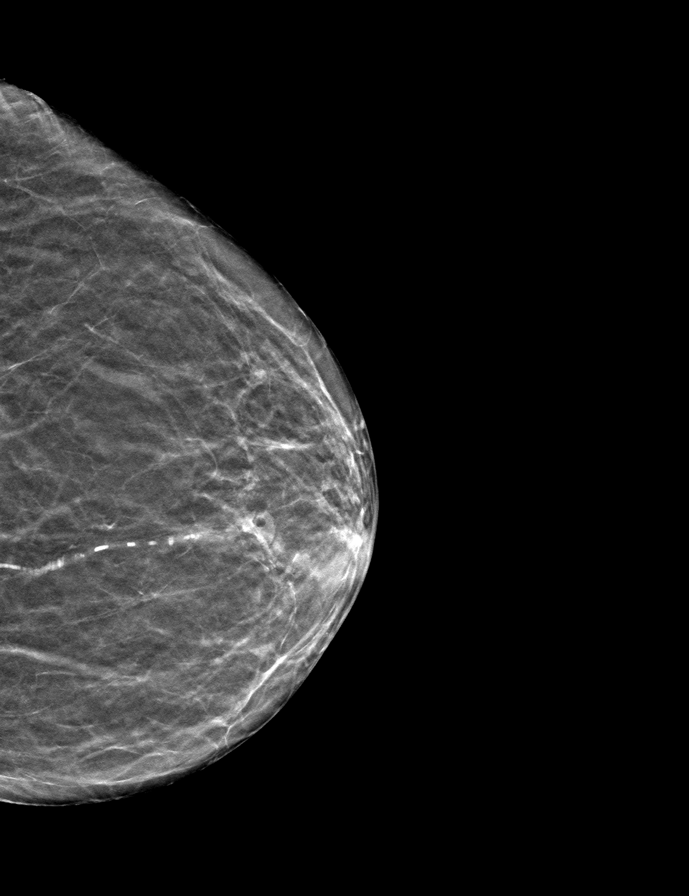

[L MLO tomo · tomo slice 18/35.0]
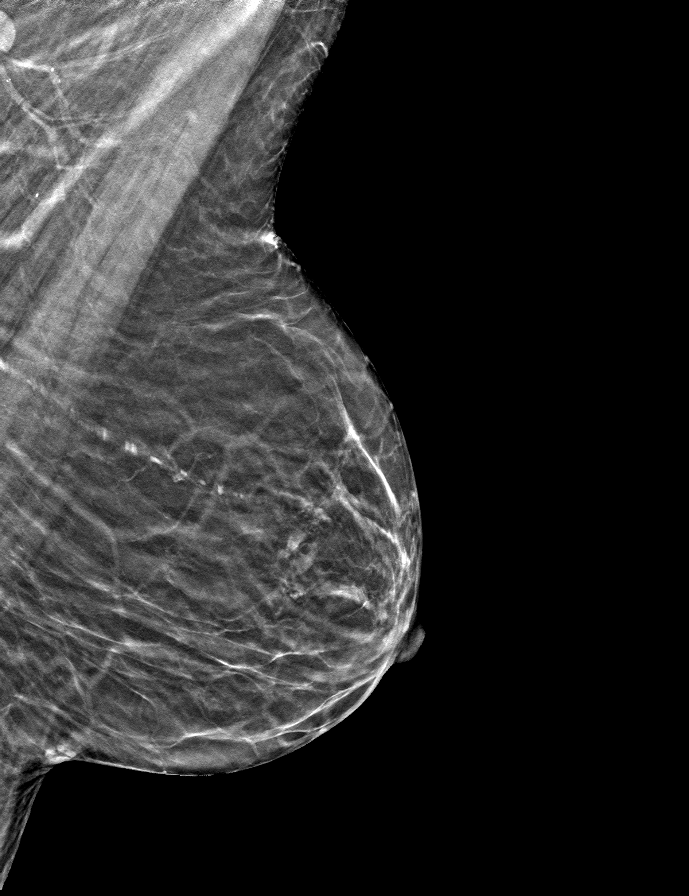

[R CC tomo · tomo slice 16/31.0]
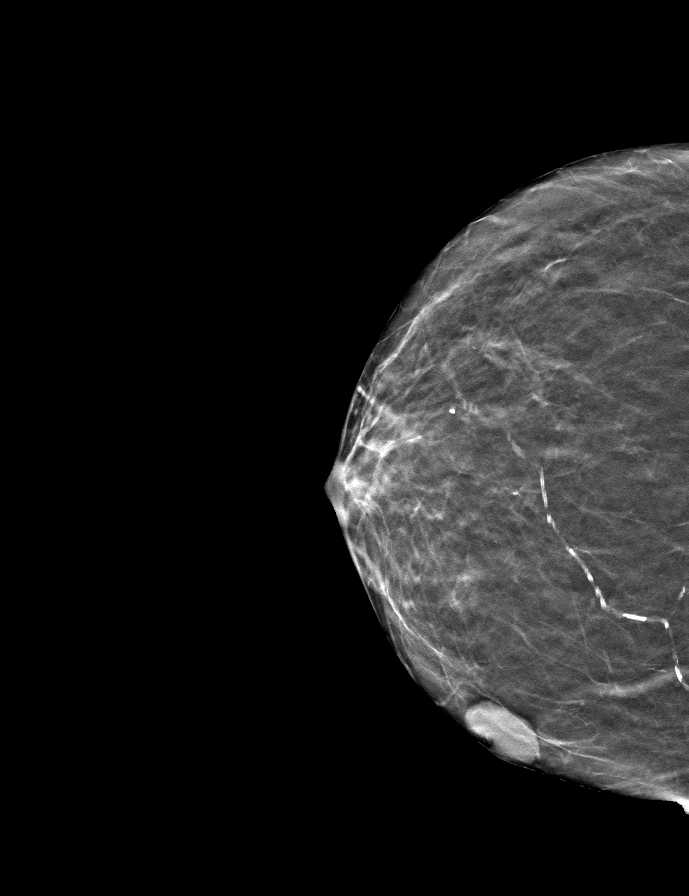

[9 of 24 positions shown; findings below may reference images not displayed]

ACR Breast Density Category b: There are scattered areas of
fibroglandular density.
FINDINGS: In the left breast, a possible mass warrants further evaluation. In
the right breast, no findings suspicious for malignancy.
IMPRESSION: Further evaluation is suggested for a possible mass in the left
breast.

RECOMMENDATION:
Diagnostic mammogram and possibly ultrasound of the left breast.
(Code:[id number]-X-44M)

The patient will be contacted regarding the findings, and additional
imaging will be scheduled.

BI-RADS CATEGORY  0: Incomplete. Need additional imaging evaluation
and/or prior mammograms for comparison.

## 2023-12-21 ENCOUNTER — Other Ambulatory Visit: Payer: Self-pay | Admitting: Pharmacist

## 2023-12-21 ENCOUNTER — Inpatient Hospital Stay: Attending: Oncology

## 2023-12-21 ENCOUNTER — Inpatient Hospital Stay

## 2023-12-21 DIAGNOSIS — D72829 Elevated white blood cell count, unspecified: Secondary | ICD-10-CM | POA: Insufficient documentation

## 2023-12-21 DIAGNOSIS — Z79899 Other long term (current) drug therapy: Secondary | ICD-10-CM | POA: Diagnosis not present

## 2023-12-21 DIAGNOSIS — D631 Anemia in chronic kidney disease: Secondary | ICD-10-CM | POA: Diagnosis not present

## 2023-12-21 DIAGNOSIS — N1831 Chronic kidney disease, stage 3a: Secondary | ICD-10-CM | POA: Insufficient documentation

## 2023-12-21 DIAGNOSIS — D508 Other iron deficiency anemias: Secondary | ICD-10-CM

## 2023-12-21 LAB — CBC WITH DIFFERENTIAL (CANCER CENTER ONLY)
Abs Immature Granulocytes: 0.05 10*3/uL (ref 0.00–0.07)
Basophils Absolute: 0.1 10*3/uL (ref 0.0–0.1)
Basophils Relative: 1 %
Eosinophils Absolute: 0.4 10*3/uL (ref 0.0–0.5)
Eosinophils Relative: 3 %
HCT: 32.1 % — ABNORMAL LOW (ref 36.0–46.0)
Hemoglobin: 10.2 g/dL — ABNORMAL LOW (ref 12.0–15.0)
Immature Granulocytes: 0 %
Lymphocytes Relative: 13 %
Lymphs Abs: 1.5 10*3/uL (ref 0.7–4.0)
MCH: 29.2 pg (ref 26.0–34.0)
MCHC: 31.8 g/dL (ref 30.0–36.0)
MCV: 92 fL (ref 80.0–100.0)
Monocytes Absolute: 0.9 10*3/uL (ref 0.1–1.0)
Monocytes Relative: 8 %
Neutro Abs: 8.3 10*3/uL — ABNORMAL HIGH (ref 1.7–7.7)
Neutrophils Relative %: 75 %
Platelet Count: 406 10*3/uL — ABNORMAL HIGH (ref 150–400)
RBC: 3.49 MIL/uL — ABNORMAL LOW (ref 3.87–5.11)
RDW: 15.5 % (ref 11.5–15.5)
WBC Count: 11.2 10*3/uL — ABNORMAL HIGH (ref 4.0–10.5)
nRBC: 0 % (ref 0.0–0.2)

## 2023-12-24 DIAGNOSIS — S82034A Nondisplaced transverse fracture of right patella, initial encounter for closed fracture: Secondary | ICD-10-CM | POA: Diagnosis not present

## 2023-12-24 DIAGNOSIS — M25511 Pain in right shoulder: Secondary | ICD-10-CM | POA: Diagnosis not present

## 2023-12-25 ENCOUNTER — Other Ambulatory Visit: Payer: Self-pay | Admitting: Family Medicine

## 2023-12-25 DIAGNOSIS — R110 Nausea: Secondary | ICD-10-CM

## 2023-12-26 ENCOUNTER — Encounter: Payer: Self-pay | Admitting: Family Medicine

## 2024-01-06 ENCOUNTER — Ambulatory Visit: Admitting: Family Medicine

## 2024-01-07 ENCOUNTER — Ambulatory Visit (INDEPENDENT_AMBULATORY_CARE_PROVIDER_SITE_OTHER): Admitting: Family Medicine

## 2024-01-07 ENCOUNTER — Other Ambulatory Visit: Payer: Self-pay | Admitting: Family Medicine

## 2024-01-07 VITALS — BP 116/58 | HR 94 | Temp 98.1°F | Ht 61.5 in | Wt 161.0 lb

## 2024-01-07 DIAGNOSIS — K7030 Alcoholic cirrhosis of liver without ascites: Secondary | ICD-10-CM | POA: Diagnosis not present

## 2024-01-07 DIAGNOSIS — K219 Gastro-esophageal reflux disease without esophagitis: Secondary | ICD-10-CM | POA: Diagnosis not present

## 2024-01-07 DIAGNOSIS — F1021 Alcohol dependence, in remission: Secondary | ICD-10-CM | POA: Diagnosis not present

## 2024-01-07 DIAGNOSIS — E559 Vitamin D deficiency, unspecified: Secondary | ICD-10-CM | POA: Diagnosis not present

## 2024-01-07 DIAGNOSIS — R300 Dysuria: Secondary | ICD-10-CM | POA: Diagnosis not present

## 2024-01-07 DIAGNOSIS — N3001 Acute cystitis with hematuria: Secondary | ICD-10-CM

## 2024-01-07 DIAGNOSIS — E890 Postprocedural hypothyroidism: Secondary | ICD-10-CM | POA: Diagnosis not present

## 2024-01-07 DIAGNOSIS — K766 Portal hypertension: Secondary | ICD-10-CM | POA: Diagnosis not present

## 2024-01-07 DIAGNOSIS — I9589 Other hypotension: Secondary | ICD-10-CM

## 2024-01-07 DIAGNOSIS — F3132 Bipolar disorder, current episode depressed, moderate: Secondary | ICD-10-CM

## 2024-01-07 LAB — POCT URINALYSIS DIP (CLINITEK)
Bilirubin, UA: NEGATIVE
Glucose, UA: NEGATIVE mg/dL
Ketones, POC UA: NEGATIVE mg/dL
Nitrite, UA: NEGATIVE
POC PROTEIN,UA: 30 — AB
Spec Grav, UA: 1.02 (ref 1.010–1.025)
Urobilinogen, UA: 0.2 U/dL
pH, UA: 6 (ref 5.0–8.0)

## 2024-01-07 MED ORDER — CIPROFLOXACIN HCL 250 MG PO TABS: 250.0000 mg | ORAL_TABLET | Freq: Two times a day (BID) | ORAL | 0 refills | Status: AC

## 2024-01-07 NOTE — Assessment & Plan Note (Signed)
 Well controlled midodrine  2.5 mg three times a day.

## 2024-01-07 NOTE — Progress Notes (Unsigned)
 Subjective:  Patient ID: Molly Parrish, female    DOB: [birth year]  Age: 58 y.o. MRN: [id number]  Chief Complaint  Patient presents with   Medical Management of Chronic Issues    HPI: The patient, with a history of anemia of chronic disease, liver cirrhosis, hypotension, and mouth ulcers, presents with ongoing constipation.   The patient's anemia is being managed with monthly Procrit injections. Patient sees Dr. Rogene Claude, nephrology, and Dr Ciro Cress, hematology.   The patient's liver cirrhosis is being managed with  Xifaxin 550 twice daily, Miralax once a day, spironolactone  and propranolol for portal hypertension. Bms once daily. If delay in xifaxin, starts lactulose up.     The patient also has hypotension, for which she is taking midodrine .  Vitamin D  deficiency: on vitamin D  once weekly. Sees Dr .Felipe Horton for your postsurgical hypothyroidism.   Hydroxyzine  25 mg three times a day and Gabapentin  100 mg three times a day for itching.  GERD: famotidine  40 mg daily and omeprazole       05/07/2023   10:44 AM 03/01/2023    3:33 PM 06/15/2022   10:02 AM 12/05/2021   10:55 AM 11/14/2021   11:16 AM  Depression screen PHQ 2/9  Decreased Interest 0 0 0 0 0  Down, Depressed, Hopeless 0 0 0 0 0  PHQ - 2 Score 0 0 0 0 0  Altered sleeping  0 3    Tired, decreased energy  3 3    Change in appetite  0 0    Feeling bad or failure about yourself   0 0    Trouble concentrating  0 0    Moving slowly or fidgety/restless   1    Suicidal thoughts  0 0    PHQ-9 Score  3 7    Difficult doing work/chores  Not difficult at all Somewhat difficult          05/07/2023   10:44 AM  Fall Risk   Falls in the past year? 1  Number falls in past yr: 1  Injury with Fall? 1    Patient Care Team: Mercy Stall, MD as PCP - General (Family Medicine) Drury Geralds., MD (Endocrinology) Katy Parry, MD as Referring Physician (Geriatric Medicine) Center, [facility] Leobardo Rainier,  MD as Referring Physician (Psychiatry) Sherolyn Dixon, DPM (Inactive) as Referring Physician (Podiatry) Deloria Fetch, MD as Consulting Physician (Oncology) Geraldyne Kluver, MD as Referring Physician (Nephrology)   Review of Systems  Constitutional:  Negative for chills, fatigue and fever.  HENT:  Negative for congestion, ear pain, rhinorrhea and sore throat.   Respiratory:  Negative for cough and shortness of breath.   Cardiovascular:  Negative for chest pain.  Gastrointestinal:  Negative for abdominal pain, constipation, diarrhea, nausea and vomiting.  Genitourinary:  Negative for dysuria and urgency.  Musculoskeletal:  Positive for back pain. Negative for myalgias.  Neurological:  Negative for dizziness, weakness, light-headedness and headaches.  Psychiatric/Behavioral:  Negative for dysphoric mood. The patient is not nervous/anxious.     Current Outpatient Medications on File Prior to Visit  Medication Sig Dispense Refill   acetaminophen  (TYLENOL ) 325 MG tablet Take by mouth.     ALPRAZolam  (XANAX ) 0.25 MG tablet Take 1 tablet by mouth twice daily as needed for anxiety 60 tablet 0   budesonide -formoterol  (SYMBICORT ) 160-4.5 MCG/ACT inhaler Inhale 2 puffs into the lungs 2 (two) times daily. 10.2 g 2   calcitRIOL (ROCALTROL) 0.5 MCG capsule Take 0.5 mcg by  mouth 2 (two) times daily.     calcium carbonate (OSCAL) 1500 (600 Ca) MG TABS tablet Take 3,600 mg by mouth 2 (two) times daily with a meal.     CONSTULOSE 10 GM/15ML solution Take 20 g by mouth 2 (two) times daily.     Cyanocobalamin  (VITAMIN B 12 PO) Take 1,000 mcg by mouth daily.     cyclobenzaprine  (FLEXERIL ) 10 MG tablet Take 1 tablet by mouth three times daily as needed for muscle spasm 90 tablet 1   famotidine  (PEPCID ) 40 MG tablet Take 40 mg by mouth at bedtime.     folic acid  (FOLVITE ) 800 MCG tablet Take 800 mcg by mouth daily.     furosemide  (LASIX ) 40 MG tablet Take 2 tablets (80 mg total) by mouth 2 (two) times  daily. 360 tablet 0   gabapentin  (NEURONTIN ) 100 MG capsule Take 1 capsule (100 mg total) by mouth 3 (three) times daily. 90 capsule 5   Glucagon  (GVOKE HYPOPEN  2-PACK) 0.5 MG/0.1ML SOAJ If glucose less than 60, use gvoke pen x 1. May repeat if not responding x 1 daily. Call EMS if not improving. 0.2 mL 2   hydrOXYzine  (VISTARIL ) 25 MG capsule TAKE 1 CAPSULE BY MOUTH THREE TIMES DAILY 90 capsule 5   Iron -Vitamins (GERITOL COMPLETE) TABS Take 1 tablet by mouth daily.     lamoTRIgine (LAMICTAL) 200 MG tablet Take 200 mg by mouth at bedtime.     levothyroxine  (SYNTHROID ) 150 MCG tablet TAKE 1 TABLET BY MOUTH ONCE DAILY BEFORE BREAKFAST 90 tablet 0   magnesium oxide (MAG-OX) 400 MG tablet Take 400 mg by mouth daily.     midodrine  (PROAMATINE ) 2.5 MG tablet Take 1 tablet (2.5 mg total) by mouth 3 (three) times daily with meals. 90 tablet 0   omeprazole (PRILOSEC) 40 MG capsule Take 40 mg by mouth daily.     ondansetron  (ZOFRAN ) 4 MG tablet TAKE 1 TABLET BY MOUTH EVERY 8 HOURS AS NEEDED FOR NAUSEA AND VOMITING 60 tablet 0   propranolol (INDERAL) 10 MG tablet Take 5 mg by mouth at bedtime.     QUEtiapine (SEROQUEL) 50 MG tablet Take 100 mg by mouth at bedtime.     sertraline (ZOLOFT) 100 MG tablet Take 100 mg by mouth in the morning and at bedtime.     spironolactone  (ALDACTONE ) 50 MG tablet Take 2 tablets by mouth twice daily 120 tablet 3   thiamine 100 MG tablet Take 100 mg by mouth daily.     traMADol  (ULTRAM ) 50 MG tablet TAKE 2 TABLETS BY MOUTH TWICE DAILY (Patient taking differently: Take 100 mg by mouth 2 (two) times daily as needed for severe pain (pain score 7-10).) 120 tablet 1   triamcinolone  (KENALOG ) 0.1 % paste APPLY  SMALL AMOUNT TWICE DAILY AS DIRECTED IN  MOUTH  OR  THROAT 5 g 0   Vitamin D , Ergocalciferol , (DRISDOL ) 1.25 MG (50000 UNIT) CAPS capsule Take 1 capsule (50,000 Units total) by mouth 2 (two) times a week. 24 capsule 0   zolpidem  (AMBIEN ) 10 MG tablet Take 1 tablet (10 mg  total) by mouth at bedtime. 30 tablet 2   No current facility-administered medications on file prior to visit.   Past Medical History:  Diagnosis Date   Alcohol dependence in remission (HCC) 10/04/2013   Alcoholic cirrhosis of liver (HCC)    Anemia    Anemia of chronic disease 08/08/2016   Arterial hypotension 05/08/2020   Bipolar disorder current episode depressed (HCC)  Brain TIA 02/19/2020   CKD (chronic kidney disease)    Episodic mood disorder (HCC) 11/07/2013   Generalized anxiety disorder    Generalized weakness 07/11/2020   GERD (gastroesophageal reflux disease)    H/O bariatric surgery 06/11/2020   Hypocalcemia 12/10/2015   Hypokalemia    Hyponatremia 02/19/2020   Intestinal malabsorption    Iron  deficiency anemia due to chronic blood loss 05/08/2020   Korsakoff syndrome (HCC)    Lumbar disc disease 10/19/2019   Microscopic hematuria 04/16/2021   Migraine 04/20/2014   Mild recurrent major depression (HCC) 04/19/2020   Multiple closed fractures of ribs of left side 06/03/2020   Formatting of this note might be different from the original. Added automatically from request for surgery [id number]   Neuropathy 01/31/2020   Obsessive-compulsive disorder 11/07/2013   Other osteoporosis without current pathological fracture    Peripheral edema 11/01/2020   Polypharmacy 06/12/2020   Portal hypertension (HCC)    Post-surgical hypothyroidism 12/10/2015   Primary insomnia 03/26/2021   Severe protein-calorie malnutrition (HCC) 03/26/2021   Shortness of breath 11/01/2020   Stage 3b chronic kidney disease (HCC) 04/16/2021   Stroke (HCC)     left basal ganglia stroke (hemorrhagic)   Telogen effluvium 03/26/2021   Ulcer of left foot with fat layer exposed (HCC) 10/04/2020   Ulcer of right foot, with fat layer exposed (HCC) 10/10/2020   Vitamin D  deficiency    Past Surgical History:  Procedure Laterality Date   BREAST EXCISIONAL BIOPSY Left    CATARACT EXTRACTION     GASTRIC BYPASS  2005    HEMORRHOID SURGERY     HERNIA REPAIR     metatarsus abductus surgery Left 1990   PARTIAL HYSTERECTOMY  05/1997   BL Ovaries remain. Done for fibroids.   THYROIDECTOMY  03/2013    Family History  Problem Relation Age of Onset   Breast cancer Mother    Osteoporosis Mother    Cancer Mother        Breast, lung, skin.    Breast cancer Maternal Grandmother    Social History   Socioeconomic History   Marital status: Married    Spouse name: Not on file   Number of children: 1   Years of education: Not on file   Highest education level: Associate degree: occupational, Scientist, product/process development, or vocational program  Occupational History   Occupation: IT trainer    Comment: Retired.  Tobacco Use   Smoking status: Never   Smokeless tobacco: Never  Substance and Sexual Activity   Alcohol use: Not Currently    Comment: history of alcoholism. sober since 2014.   Drug use: Never   Sexual activity: Yes  Other Topics Concern   Not on file  Social History Narrative   Right handed   Social Drivers of Health   Financial Resource Strain: Low Risk  (01/07/2024)   Overall Financial Resource Strain (CARDIA)    Difficulty of Paying Living Expenses: Not hard at all  Food Insecurity: No Food Insecurity (01/07/2024)   Hunger Vital Sign    Worried About Running Out of Food in the Last Year: Never true    Ran Out of Food in the Last Year: Never true  Transportation Needs: No Transportation Needs (01/07/2024)   PRAPARE - Administrator, Civil Service (Medical): No    Lack of Transportation (Non-Medical): No  Physical Activity: Inactive (01/07/2024)   Exercise Vital Sign    Days of Exercise per Week: 0 days    Minutes of Exercise  per Session: Not on file  Stress: Stress Concern Present (01/07/2024)   [employer] of Occupational Health - Occupational Stress Questionnaire    Feeling of Stress: Very much  Social Connections: Moderately Isolated (01/07/2024)   Social Connection and Isolation Panel     Frequency of Communication with Friends and Family: More than three times a week    Frequency of Social Gatherings with Friends and Family: Once a week    Attends Religious Services: Never    Database administrator or Organizations: No    Attends Engineer, structural: Not on file    Marital Status: Married    Objective:  BP (!) 116/58   Pulse 94   Temp 98.1 F (36.7 C)   Ht 5' 1.5 (1.562 m)   Wt 161 lb (73 kg)   SpO2 91%   BMI 29.93 kg/m      01/07/2024   10:42 AM 11/25/2023   10:38 AM 11/23/2023   11:03 AM  BP/Weight  Systolic BP 116 108 98  Diastolic BP 58 64 63  Wt. (Lbs) 161 154.6   BMI 29.93 kg/m2 30.19 kg/m2     Physical Exam  {Perform Simple Foot Exam  Perform Detailed exam:1} {Insert foot Exam (Optional):30965}   Lab Results  Component Value Date   WBC 11.2 (H) 12/21/2023   HGB 10.2 (L) 12/21/2023   HCT 32.1 (L) 12/21/2023   PLT 406 (H) 12/21/2023   GLUCOSE 74 09/30/2023   CHOL 121 03/01/2023   TRIG 65 03/01/2023   HDL 68 03/01/2023   LDLCALC 39 03/01/2023   ALT 14 09/30/2023   AST 21 09/30/2023   NA 137 09/30/2023   K 4.3 09/30/2023   CL 97 09/30/2023   CREATININE 1.43 (H) 09/30/2023   BUN 31 (H) 09/30/2023   CO2 25 09/30/2023   TSH 30.000 (H) 05/06/2023      Assessment & Plan:  Portal hypertension (HCC) Assessment & Plan: Continue propranolol.   Other specified hypotension Assessment & Plan: Well controlled midodrine  2.5 mg three times a day.    Gastroesophageal reflux disease without esophagitis  Post-surgical hypothyroidism Assessment & Plan: Previously well controlled Continue Synthroid  at current dose    Vitamin D  deficiency     No orders of the defined types were placed in this encounter.   No orders of the defined types were placed in this encounter.    Follow-up: No follow-ups on file.   I,Dreux Mcgroarty I Leal-Borjas,acting as a scribe for Mercy Stall, MD.,have documented all relevant documentation on the  behalf of Mercy Stall, MD,as directed by  Mercy Stall, MD while in the presence of Mercy Stall, MD.   An After Visit Summary was printed and given to the patient.  Mercy Stall, MD  Family Practice [phone]

## 2024-01-07 NOTE — Assessment & Plan Note (Signed)
 Previously well controlled Continue Synthroid at current dose

## 2024-01-07 NOTE — Assessment & Plan Note (Signed)
 -  Continue propranolol

## 2024-01-08 LAB — CBC WITH DIFFERENTIAL/PLATELET
Basophils Absolute: 0.1 10*3/uL (ref 0.0–0.2)
Basos: 0 %
EOS (ABSOLUTE): 0.4 10*3/uL (ref 0.0–0.4)
Eos: 2 %
Hematocrit: 29.9 % — ABNORMAL LOW (ref 34.0–46.6)
Hemoglobin: 9.1 g/dL — ABNORMAL LOW (ref 11.1–15.9)
Immature Grans (Abs): 0.1 10*3/uL (ref 0.0–0.1)
Immature Granulocytes: 1 %
Lymphocytes Absolute: 1.4 10*3/uL (ref 0.7–3.1)
Lymphs: 8 %
MCH: 28.8 pg (ref 26.6–33.0)
MCHC: 30.4 g/dL — ABNORMAL LOW (ref 31.5–35.7)
MCV: 95 fL (ref 79–97)
Monocytes Absolute: 1.1 10*3/uL — ABNORMAL HIGH (ref 0.1–0.9)
Monocytes: 6 %
Neutrophils Absolute: 15.3 10*3/uL — ABNORMAL HIGH (ref 1.4–7.0)
Neutrophils: 83 %
Platelets: 284 10*3/uL (ref 150–450)
RBC: 3.16 x10E6/uL — ABNORMAL LOW (ref 3.77–5.28)
RDW: 13.8 % (ref 11.7–15.4)
WBC: 18.4 10*3/uL — ABNORMAL HIGH (ref 3.4–10.8)

## 2024-01-08 LAB — COMPREHENSIVE METABOLIC PANEL WITH GFR
ALT: 14 IU/L (ref 0–32)
AST: 17 IU/L (ref 0–40)
Albumin: 3.2 g/dL — ABNORMAL LOW (ref 3.8–4.9)
Alkaline Phosphatase: 171 IU/L — ABNORMAL HIGH (ref 44–121)
BUN/Creatinine Ratio: 21 (ref 12–28)
BUN: 24 mg/dL (ref 8–27)
Bilirubin Total: 0.3 mg/dL (ref 0.0–1.2)
CO2: 18 mmol/L — ABNORMAL LOW (ref 20–29)
Calcium: 10.2 mg/dL (ref 8.7–10.3)
Chloride: 106 mmol/L (ref 96–106)
Creatinine, Ser: 1.14 mg/dL — ABNORMAL HIGH (ref 0.57–1.00)
Globulin, Total: 2.5 g/dL (ref 1.5–4.5)
Glucose: 83 mg/dL (ref 70–99)
Potassium: 4.4 mmol/L (ref 3.5–5.2)
Sodium: 138 mmol/L (ref 134–144)
Total Protein: 5.7 g/dL — ABNORMAL LOW (ref 6.0–8.5)
eGFR: 55 mL/min/{1.73_m2} — ABNORMAL LOW (ref >59)

## 2024-01-08 LAB — LIPID PANEL
Chol/HDL Ratio: 2.1 ratio (ref 0.0–4.4)
Cholesterol, Total: 126 mg/dL (ref 100–199)
HDL: 61 mg/dL (ref >39)
LDL Chol Calc (NIH): 48 mg/dL (ref 0–99)
Triglycerides: 87 mg/dL (ref 0–149)
VLDL Cholesterol Cal: 17 mg/dL (ref 5–40)

## 2024-01-08 LAB — VITAMIN D 25 HYDROXY (VIT D DEFICIENCY, FRACTURES): Vit D, 25-Hydroxy: 27 ng/mL — ABNORMAL LOW (ref 30.0–100.0)

## 2024-01-08 LAB — TSH: TSH: 0.119 u[IU]/mL — ABNORMAL LOW (ref 0.450–4.500)

## 2024-01-09 ENCOUNTER — Ambulatory Visit: Payer: Self-pay | Admitting: Family Medicine

## 2024-01-09 DIAGNOSIS — F3132 Bipolar disorder, current episode depressed, moderate: Secondary | ICD-10-CM | POA: Insufficient documentation

## 2024-01-09 NOTE — Assessment & Plan Note (Signed)
Management per specialist The current medical regimen is effective;  continue present plan and medications.  

## 2024-01-09 NOTE — Assessment & Plan Note (Addendum)
 famotidine  40 mg daily and omeprazole 40 mg daily

## 2024-01-09 NOTE — Assessment & Plan Note (Signed)
 Continued to sustain.

## 2024-01-09 NOTE — Assessment & Plan Note (Signed)
 Check vitamin D level

## 2024-01-09 NOTE — Assessment & Plan Note (Signed)
 Sent cipro

## 2024-01-10 LAB — URINE CULTURE

## 2024-01-10 MED ORDER — SULFAMETHOXAZOLE-TRIMETHOPRIM 800-160 MG PO TABS
1.0000 | ORAL_TABLET | Freq: Two times a day (BID) | ORAL | 0 refills | Status: DC
Start: 2024-01-10 — End: 2024-02-01

## 2024-01-10 NOTE — Addendum Note (Signed)
 Addended byBETHA SHERRE CLAPPER on: 01/10/2024 10:34 PM   Modules accepted: Orders

## 2024-01-11 LAB — SPECIMEN STATUS REPORT

## 2024-01-11 LAB — T4, FREE: Free T4: 1.16 ng/dL (ref 0.82–1.77)

## 2024-01-13 ENCOUNTER — Other Ambulatory Visit: Payer: Self-pay | Admitting: Family Medicine

## 2024-01-16 ENCOUNTER — Other Ambulatory Visit: Payer: Self-pay | Admitting: Family Medicine

## 2024-01-17 NOTE — Progress Notes (Deleted)
 [facility]  [address] [place],  [place]  [address] [phone]  Clinic Day:  09/24/2023  Referring physician: Sherre Clapper, MD  HISTORY OF PRESENT ILLNESS:  The patient is a 58 y.o. female with a history of both leukocytosis and anemia.  A recent bone marrow biopsy did not show any obvious marrow etiology behind her abnormal counts.  She was receiving monthly Retacrit  shots up until December 2024 to get her hemoglobin to/above 10.  She comes in today to reassess her anemia.  Overall, the patient claims to be doing okay.  Since her last visit, she has been hospitalized for multiple issues, including MRSA bacteremia, a kidney infection which led to mild renal insufficiency, and osteomyelitis, which led to partial amputation of her left big toe. As it pertains to her anemia, she does have occasional fatigue, but denies having any overt forms of blood loss.    PHYSICAL EXAM:  There were no vitals taken for this visit. Wt Readings from Last 3 Encounters:  01/07/24 161 lb (73 kg)  11/25/23 154 lb 9.6 oz (70.1 kg)  11/16/23 154 lb 9.6 oz (70.1 kg)   There is no height or weight on file to calculate BMI. Performance status (ECOG): 1 Physical Exam Constitutional:      Appearance: Normal appearance. She is ill-appearing (chronically ill appearance).     Comments: She ambulates with a walker  HENT:     Mouth/Throat:     Mouth: Mucous membranes are moist.     Pharynx: Oropharynx is clear. No oropharyngeal exudate or posterior oropharyngeal erythema.   Cardiovascular:     Rate and Rhythm: Normal rate and regular rhythm.     Heart sounds: No murmur heard.    No friction rub. No gallop.  Pulmonary:     Effort: Pulmonary effort is normal. No respiratory distress.     Breath sounds: No wheezing, rhonchi or rales.  Abdominal:     General: Bowel sounds are normal. There is no distension.     Palpations: Abdomen is soft. There is no mass.     Tenderness:  There is no abdominal tenderness.   Musculoskeletal:        General: No swelling.     Right lower leg: No edema.     Left lower leg: No edema.  Lymphadenopathy:     Cervical: No cervical adenopathy.     Upper Body:     Right upper body: No supraclavicular or axillary adenopathy.     Left upper body: No supraclavicular or axillary adenopathy.     Lower Body: No right inguinal adenopathy. No left inguinal adenopathy.   Skin:    General: Skin is warm.     Coloration: Skin is not jaundiced.     Findings: No lesion or rash.   Neurological:     General: No focal deficit present.     Mental Status: She is alert and oriented to person, place, and time. Mental status is at baseline.   Psychiatric:        Mood and Affect: Mood normal.        Behavior: Behavior normal.        Thought Content: Thought content normal.    LABS:    Latest Reference Range & Units 09/24/23 09:41  WBC 4.0 - 10.5 K/uL 12.2 (H)  RBC 3.87 - 5.11 MIL/uL 2.96 (L)  Hemoglobin 12.0 - 15.0 g/dL 8.9 (L)  HCT 63.9 - 53.9 % 27.3 (L)  MCV  80.0 - 100.0 fL 92.2  MCH 26.0 - 34.0 pg 30.1  MCHC 30.0 - 36.0 g/dL 67.3  RDW 88.4 - 84.4 % 16.0 (H)  Platelets 150 - 400 K/uL 490 (H)  nRBC 0.0 - 0.2 % 0 /100 WBC 0.00 0  Neutrophils % 78  Lymphocytes % 11  Monocytes Relative % 8  Eosinophil % 1  Basophil % 1  Immature Granulocytes % 1  (H): Data is abnormally high (L): Data is abnormally low  Latest Reference Range & Units 09/24/23 09:41  Iron  28 - 170 ug/dL 60  UIBC ug/dL 84  TIBC 749 - 549 ug/dL 855 (L)  Saturation Ratios 10.4 - 31.8 % 42 (H)  Ferritin 11 - 307 ng/mL 439 (H)  Folate >5.9 ng/mL 17.6  (L): Data is abnormally low (H): Data is abnormally high    Latest Reference Range & Units 05/13/23 10:06  Sodium 135 - 145 mmol/L 134 (L)  Potassium 3.5 - 5.1 mmol/L 3.5  Chloride 98 - 111 mmol/L 97 (L)  CO2 22 - 32 mmol/L 27  Glucose 70 - 99 mg/dL 871 (H)  BUN 6 - 20 mg/dL 30 (H)  Creatinine 9.55 - 1.00  mg/dL 8.93 (H)  Calcium 8.9 - 10.3 mg/dL 7.5 (L)  Anion gap 5 - 15  10  Alkaline Phosphatase 38 - 126 U/L 120  Albumin 3.5 - 5.0 g/dL 2.9 (L)  AST 15 - 41 U/L 31  ALT 0 - 44 U/L 29  Total Protein 6.5 - 8.1 g/dL 6.3 (L)  Total Bilirubin 0.3 - 1.2 mg/dL 0.7  GFR, Est Non African American >60 mL/min >60  Iron  28 - 170 ug/dL 59  UIBC ug/dL 771  TIBC 749 - 549 ug/dL 712  Saturation Ratios 10.4 - 31.8 % 21  Ferritin 11 - 307 ng/mL 263  Folate >5.9 ng/mL 20.7  Vitamin B12 180 - 914 pg/mL 295  (L): Data is abnormally low (H): Data is abnormally high  ASSESSMENT & PLAN:  Assessment/Plan:  A 58 y.o. female with both leukocytosis and anemia secondary to chronic renal insufficiency.  As her hemoglobin remains below 10, she will get back to taking monthly Retacrit  injections at 40,000 units.  Her CBC will be checked monthly to see how well she is responding to her Retacrit  shots.  I will see her back in 4 months for repeat clinical assessment.  The patient understands all the plans discussed today and is in agreement with them.    Andyn Sales DELENA Kerns, MD

## 2024-01-18 ENCOUNTER — Inpatient Hospital Stay

## 2024-01-18 ENCOUNTER — Ambulatory Visit

## 2024-01-18 ENCOUNTER — Inpatient Hospital Stay: Attending: Oncology

## 2024-01-18 ENCOUNTER — Inpatient Hospital Stay: Admitting: Oncology

## 2024-01-18 DIAGNOSIS — N189 Chronic kidney disease, unspecified: Secondary | ICD-10-CM | POA: Insufficient documentation

## 2024-01-18 DIAGNOSIS — N390 Urinary tract infection, site not specified: Secondary | ICD-10-CM | POA: Insufficient documentation

## 2024-01-18 DIAGNOSIS — Z79899 Other long term (current) drug therapy: Secondary | ICD-10-CM | POA: Insufficient documentation

## 2024-01-18 DIAGNOSIS — D72829 Elevated white blood cell count, unspecified: Secondary | ICD-10-CM | POA: Insufficient documentation

## 2024-01-18 DIAGNOSIS — D631 Anemia in chronic kidney disease: Secondary | ICD-10-CM | POA: Insufficient documentation

## 2024-01-18 DIAGNOSIS — Z87440 Personal history of urinary (tract) infections: Secondary | ICD-10-CM | POA: Insufficient documentation

## 2024-01-20 DIAGNOSIS — M12811 Other specific arthropathies, not elsewhere classified, right shoulder: Secondary | ICD-10-CM | POA: Diagnosis not present

## 2024-01-20 DIAGNOSIS — S82034A Nondisplaced transverse fracture of right patella, initial encounter for closed fracture: Secondary | ICD-10-CM | POA: Diagnosis not present

## 2024-01-24 ENCOUNTER — Ambulatory Visit

## 2024-01-24 ENCOUNTER — Other Ambulatory Visit

## 2024-01-24 ENCOUNTER — Encounter: Payer: Self-pay | Admitting: Family Medicine

## 2024-01-24 ENCOUNTER — Ambulatory Visit: Admitting: Oncology

## 2024-01-24 ENCOUNTER — Other Ambulatory Visit: Payer: Self-pay

## 2024-01-24 DIAGNOSIS — Z13820 Encounter for screening for osteoporosis: Secondary | ICD-10-CM

## 2024-01-24 DIAGNOSIS — Z1231 Encounter for screening mammogram for malignant neoplasm of breast: Secondary | ICD-10-CM

## 2024-01-25 ENCOUNTER — Encounter: Payer: Self-pay | Admitting: Family Medicine

## 2024-01-25 DIAGNOSIS — N390 Urinary tract infection, site not specified: Secondary | ICD-10-CM | POA: Diagnosis not present

## 2024-01-25 DIAGNOSIS — N1832 Chronic kidney disease, stage 3b: Secondary | ICD-10-CM | POA: Diagnosis not present

## 2024-01-25 DIAGNOSIS — K7682 Hepatic encephalopathy: Secondary | ICD-10-CM | POA: Diagnosis not present

## 2024-01-25 DIAGNOSIS — R300 Dysuria: Secondary | ICD-10-CM | POA: Diagnosis not present

## 2024-01-25 DIAGNOSIS — A499 Bacterial infection, unspecified: Secondary | ICD-10-CM | POA: Diagnosis not present

## 2024-01-25 DIAGNOSIS — Z6826 Body mass index (BMI) 26.0-26.9, adult: Secondary | ICD-10-CM | POA: Diagnosis not present

## 2024-01-25 DIAGNOSIS — Z1211 Encounter for screening for malignant neoplasm of colon: Secondary | ICD-10-CM | POA: Diagnosis not present

## 2024-01-25 DIAGNOSIS — Z79899 Other long term (current) drug therapy: Secondary | ICD-10-CM | POA: Diagnosis not present

## 2024-01-25 DIAGNOSIS — E663 Overweight: Secondary | ICD-10-CM | POA: Diagnosis not present

## 2024-01-25 DIAGNOSIS — D539 Nutritional anemia, unspecified: Secondary | ICD-10-CM | POA: Diagnosis not present

## 2024-01-26 ENCOUNTER — Other Ambulatory Visit: Payer: Self-pay | Admitting: Family Medicine

## 2024-01-28 ENCOUNTER — Telehealth: Payer: Self-pay

## 2024-01-28 NOTE — Progress Notes (Signed)
 Complex Care Management Note Care Guide Note  01/28/2024 Name: Molly Parrish MRN: [id number] DOB: 1965-12-05   Complex Care Management Outreach Attempts: An unsuccessful telephone outreach was attempted today to offer the patient information about available complex care management services.  Follow Up Plan:  Additional outreach attempts will be made to offer the patient complex care management information and services.   Encounter Outcome:  No Answer  Dreama [facility]  [facility] Management Assistant Direct Dial: [phone]  Fax: [phone]

## 2024-01-31 ENCOUNTER — Encounter: Payer: Self-pay | Admitting: Family Medicine

## 2024-01-31 NOTE — Progress Notes (Unsigned)
 [facility]  [address] [place],  [place]  [address] [phone]  Clinic Day:  02/01/2024  Referring physician: Sherre Clapper, MD  HISTORY OF PRESENT ILLNESS:  The patient is a 58 y.o. female with a history of both leukocytosis and anemia.  A past bone marrow biopsy did not show any obvious marrow etiology behind her abnormal counts.  She comes in today to reassess her anemia as she is receiving monthly Retacrit  injections over these past few months.  Since her last visit, the patient has been doing okay.  She has been battling a urinary tract infection recently, for which she was given antibiotics.  However, she claims to have some back pain to where she is becoming more concerned about her urinary tract infection potentially worsening into pyelonephritis.  As she is seeing her primary care provider tomorrow, she is not interested in going to either urgent care or the emergency room for further evaluation of her UTI.  PHYSICAL EXAM:  Blood pressure 119/79, pulse 79, temperature 98.9 F (37.2 C), temperature source Oral, resp. rate 14, height 5' 1.5 (1.562 m), weight 165 lb 8 oz (75.1 kg), SpO2 95%. Wt Readings from Last 3 Encounters:  02/01/24 165 lb 8 oz (75.1 kg)  01/07/24 161 lb (73 kg)  11/25/23 154 lb 9.6 oz (70.1 kg)   Body mass index is 30.76 kg/m. Performance status (ECOG): 1 Physical Exam Constitutional:      Appearance: Normal appearance. She is ill-appearing (chronically ill appearance).     Comments: She ambulates with a cane.  She looks physically better today versus previous visits  HENT:     Mouth/Throat:     Mouth: Mucous membranes are moist.     Pharynx: Oropharynx is clear. No oropharyngeal exudate or posterior oropharyngeal erythema.  Cardiovascular:     Rate and Rhythm: Normal rate and regular rhythm.     Heart sounds: No murmur heard.    No friction rub. No gallop.  Pulmonary:     Effort: Pulmonary effort is normal. No  respiratory distress.     Breath sounds: No wheezing, rhonchi or rales.  Abdominal:     General: Bowel sounds are normal. There is no distension.     Palpations: Abdomen is soft. There is no mass.     Tenderness: There is no abdominal tenderness.  Musculoskeletal:        General: No swelling.     Right lower leg: No edema.     Left lower leg: No edema.  Lymphadenopathy:     Cervical: No cervical adenopathy.     Upper Body:     Right upper body: No supraclavicular or axillary adenopathy.     Left upper body: No supraclavicular or axillary adenopathy.     Lower Body: No right inguinal adenopathy. No left inguinal adenopathy.  Skin:    General: Skin is warm.     Coloration: Skin is not jaundiced.     Findings: No lesion or rash.  Neurological:     General: No focal deficit present.     Mental Status: She is alert and oriented to person, place, and time. Mental status is at baseline.  Psychiatric:        Mood and Affect: Mood normal.        Behavior: Behavior normal.        Thought Content: Thought content normal.    LABS:    Latest Reference Range & Units 02/01/24 14:07  WBC 4.0 -  10.5 K/uL 11.0 (H)  RBC 3.87 - 5.11 MIL/uL 3.73 (L)  Hemoglobin 12.0 - 15.0 g/dL 89.1 (L)  HCT 63.9 - 53.9 % 34.9 (L)  MCV 80.0 - 100.0 fL 93.6  MCH 26.0 - 34.0 pg 29.0  MCHC 30.0 - 36.0 g/dL 69.0  RDW 88.4 - 84.4 % 15.9 (H)  Platelets 150 - 400 K/uL 334  nRBC 0.0 - 0.2 % 0.0  Neutrophils % 66  Lymphocytes % 20  Monocytes Relative % 7  Eosinophil % 5  Basophil % 1  Immature Granulocytes % 1  (H): Data is abnormally high (L): Data is abnormally low  Latest Reference Range & Units 02/01/24 14:07  Sodium 135 - 145 mmol/L 138  Potassium 3.5 - 5.1 mmol/L 3.6  Chloride 98 - 111 mmol/L 98  CO2 22 - 32 mmol/L 26  Glucose 70 - 99 mg/dL 883 (H)  BUN 6 - 20 mg/dL 25 (H)  Creatinine 9.55 - 1.00 mg/dL 8.59 (H)  Calcium 8.9 - 10.3 mg/dL 89.3 (H)  Anion gap 5 - 15  14  Alkaline Phosphatase 38 - 126  U/L 155 (H)  Albumin 3.5 - 5.0 g/dL 3.5  AST 15 - 41 U/L 28  ALT 0 - 44 U/L 26  Total Protein 6.5 - 8.1 g/dL 6.6  Total Bilirubin 0.0 - 1.2 mg/dL 0.3  GFR, Est Non African American >60 mL/min 43 (L)  (H): Data is abnormally high (L): Data is abnormally low   Latest Reference Range & Units 02/01/24 14:06  Iron  28 - 170 ug/dL 75  UIBC ug/dL 759  TIBC 749 - 549 ug/dL 684  Saturation Ratios 10.4 - 31.8 % 24  Ferritin 11 - 307 ng/mL 328 (H)  (H): Data is abnormally high  ASSESSMENT & PLAN:  Assessment/Plan:  A 58 y.o. female with both leukocytosis and anemia secondary to chronic renal insufficiency.  I am pleased as her hemoglobin is above 10 today.  This likely represents the efficacy of her monthly Retacrit  injections.  As she has a hemoglobin above 10 today, her monthly Retacrit  injection will be held today.  Although still elevated, her white count today is much lower than what it has been in the recent past.  She did have a white count of 18.4 3+ weeks ago while at her primary care office.  Since then, she has been receiving antibiotics for her urinary tract infection, which is likely why her white count has improved.  As she has back pain that she thinks is related to her urinary tract infection, she knows to speak with her primary care physician about this issue tomorrow.  Otherwise, her CBC will continue to be followed at our clinic monthly to ensure it remains at/above 10.  I will see her back in 4 months for repeat clinical assessment.  The patient understands all the plans discussed today and is in agreement with them.    Azya Barbero DELENA Kerns, MD

## 2024-02-01 ENCOUNTER — Inpatient Hospital Stay

## 2024-02-01 ENCOUNTER — Other Ambulatory Visit: Payer: Self-pay

## 2024-02-01 ENCOUNTER — Other Ambulatory Visit: Payer: Self-pay | Admitting: Oncology

## 2024-02-01 ENCOUNTER — Ambulatory Visit: Payer: Self-pay

## 2024-02-01 ENCOUNTER — Inpatient Hospital Stay (HOSPITAL_BASED_OUTPATIENT_CLINIC_OR_DEPARTMENT_OTHER): Admitting: Oncology

## 2024-02-01 ENCOUNTER — Telehealth: Payer: Self-pay | Admitting: Oncology

## 2024-02-01 VITALS — BP 119/79 | HR 79 | Temp 98.9°F | Resp 14 | Ht 61.5 in | Wt 165.5 lb

## 2024-02-01 DIAGNOSIS — D638 Anemia in other chronic diseases classified elsewhere: Secondary | ICD-10-CM | POA: Diagnosis not present

## 2024-02-01 DIAGNOSIS — D631 Anemia in chronic kidney disease: Secondary | ICD-10-CM

## 2024-02-01 DIAGNOSIS — N189 Chronic kidney disease, unspecified: Secondary | ICD-10-CM

## 2024-02-01 DIAGNOSIS — Z87440 Personal history of urinary (tract) infections: Secondary | ICD-10-CM | POA: Diagnosis not present

## 2024-02-01 DIAGNOSIS — D508 Other iron deficiency anemias: Secondary | ICD-10-CM

## 2024-02-01 DIAGNOSIS — N390 Urinary tract infection, site not specified: Secondary | ICD-10-CM | POA: Diagnosis not present

## 2024-02-01 DIAGNOSIS — Z79899 Other long term (current) drug therapy: Secondary | ICD-10-CM | POA: Diagnosis not present

## 2024-02-01 DIAGNOSIS — D72829 Elevated white blood cell count, unspecified: Secondary | ICD-10-CM | POA: Diagnosis not present

## 2024-02-01 LAB — CBC WITH DIFFERENTIAL (CANCER CENTER ONLY)
Abs Immature Granulocytes: 0.1 K/uL — ABNORMAL HIGH (ref 0.00–0.07)
Basophils Absolute: 0.1 K/uL (ref 0.0–0.1)
Basophils Relative: 1 %
Eosinophils Absolute: 0.5 K/uL (ref 0.0–0.5)
Eosinophils Relative: 5 %
HCT: 34.9 % — ABNORMAL LOW (ref 36.0–46.0)
Hemoglobin: 10.8 g/dL — ABNORMAL LOW (ref 12.0–15.0)
Immature Granulocytes: 1 %
Lymphocytes Relative: 20 %
Lymphs Abs: 2.2 K/uL (ref 0.7–4.0)
MCH: 29 pg (ref 26.0–34.0)
MCHC: 30.9 g/dL (ref 30.0–36.0)
MCV: 93.6 fL (ref 80.0–100.0)
Monocytes Absolute: 0.8 K/uL (ref 0.1–1.0)
Monocytes Relative: 7 %
Neutro Abs: 7.3 K/uL (ref 1.7–7.7)
Neutrophils Relative %: 66 %
Platelet Count: 334 K/uL (ref 150–400)
RBC: 3.73 MIL/uL — ABNORMAL LOW (ref 3.87–5.11)
RDW: 15.9 % — ABNORMAL HIGH (ref 11.5–15.5)
WBC Count: 11 K/uL — ABNORMAL HIGH (ref 4.0–10.5)
nRBC: 0 % (ref 0.0–0.2)

## 2024-02-01 LAB — FERRITIN: Ferritin: 328 ng/mL — ABNORMAL HIGH (ref 11–307)

## 2024-02-01 LAB — CMP (CANCER CENTER ONLY)
ALT: 26 U/L (ref 0–44)
AST: 28 U/L (ref 15–41)
Albumin: 3.5 g/dL (ref 3.5–5.0)
Alkaline Phosphatase: 155 U/L — ABNORMAL HIGH (ref 38–126)
Anion gap: 14 (ref 5–15)
BUN: 25 mg/dL — ABNORMAL HIGH (ref 6–20)
CO2: 26 mmol/L (ref 22–32)
Calcium: 10.6 mg/dL — ABNORMAL HIGH (ref 8.9–10.3)
Chloride: 98 mmol/L (ref 98–111)
Creatinine: 1.4 mg/dL — ABNORMAL HIGH (ref 0.44–1.00)
GFR, Estimated: 43 mL/min — ABNORMAL LOW (ref >60)
Glucose, Bld: 116 mg/dL — ABNORMAL HIGH (ref 70–99)
Potassium: 3.6 mmol/L (ref 3.5–5.1)
Sodium: 138 mmol/L (ref 135–145)
Total Bilirubin: 0.3 mg/dL (ref 0.0–1.2)
Total Protein: 6.6 g/dL (ref 6.5–8.1)

## 2024-02-01 LAB — IRON AND TIBC
Iron: 75 ug/dL (ref 28–170)
Saturation Ratios: 24 % (ref 10.4–31.8)
TIBC: 315 ug/dL (ref 250–450)
UIBC: 240 ug/dL

## 2024-02-01 NOTE — Telephone Encounter (Signed)
 FYI Only or Action Required?: FYI only for provider.  Patient was last seen in primary care on 01/07/2024 by Sherre Clapper, MD.  Called Nurse Triage reporting Hematuria.  Symptoms began several days ago.  Interventions attempted: OTC medications: tylenol  and Rest, hydration, or home remedies.  Symptoms are: gradually worsening.  Triage Disposition: See Physician Within 24 Hours  Patient/caregiver understands and will follow disposition?: Yes      Copied from CRM #[id number]. Topic: Clinical - Red Word Triage >> Feb 01, 2024  8:56 AM Rosaria BRAVO wrote: Red Word that prompted transfer to Nurse Triage: Blood in urine Reason for Disposition  Blood in urine  (Exception: Could be normal menstrual bleeding.)  Answer Assessment - Initial Assessment Questions 1. COLOR of URINE: Describe the color of the urine.  (e.g., tea-colored, pink, red, bloody) Do you have blood clots in your urine? (e.g., none, pea, grape, small coin)     Tea colored 2. ONSET: When did the bleeding start?      Sunday 3. EPISODES: How many times has there been blood in the urine? or How many times today?    Every time  4. PAIN with URINATION: Is there any pain with passing your urine? If Yes, ask: How bad is the pain?  (Scale 1-10; or mild, moderate, severe)     no 5. FEVER: Do you have a fever? If Yes, ask: What is your temperature, how was it measured, and when did it start?     no 6. ASSOCIATED SYMPTOMS: Are you passing urine more frequently than usual?     urgerncy 7. OTHER SYMPTOMS: Do you have any other symptoms? (e.g., back/flank pain, abdomen pain, vomiting)     Back pain, abd swelling and abd pain  Protocols used: Urine - Blood In-A-AH

## 2024-02-01 NOTE — Telephone Encounter (Signed)
 Patient has been scheduled for follow-up visit per 02/01/24 LOS.  Pt aware of scheduled appt details.

## 2024-02-02 ENCOUNTER — Ambulatory Visit (INDEPENDENT_AMBULATORY_CARE_PROVIDER_SITE_OTHER): Admitting: Family Medicine

## 2024-02-02 ENCOUNTER — Encounter: Payer: Self-pay | Admitting: Family Medicine

## 2024-02-02 ENCOUNTER — Other Ambulatory Visit: Payer: Self-pay | Admitting: Family Medicine

## 2024-02-02 VITALS — BP 114/68 | HR 90 | Temp 98.0°F | Ht 61.5 in | Wt 152.0 lb

## 2024-02-02 DIAGNOSIS — Z9884 Bariatric surgery status: Secondary | ICD-10-CM

## 2024-02-02 DIAGNOSIS — I959 Hypotension, unspecified: Secondary | ICD-10-CM

## 2024-02-02 DIAGNOSIS — R799 Abnormal finding of blood chemistry, unspecified: Secondary | ICD-10-CM

## 2024-02-02 DIAGNOSIS — R310 Gross hematuria: Secondary | ICD-10-CM | POA: Diagnosis not present

## 2024-02-02 DIAGNOSIS — M545 Low back pain, unspecified: Secondary | ICD-10-CM

## 2024-02-02 DIAGNOSIS — N3000 Acute cystitis without hematuria: Secondary | ICD-10-CM

## 2024-02-02 LAB — CBC WITH DIFFERENTIAL/PLATELET
Basophils Absolute: 0.1 x10E3/uL (ref 0.0–0.2)
Basos: 1 %
EOS (ABSOLUTE): 0.4 x10E3/uL (ref 0.0–0.4)
Eos: 2 %
Hematocrit: 37.7 % (ref 34.0–46.6)
Hemoglobin: 11.5 g/dL (ref 11.1–15.9)
Immature Grans (Abs): 0.1 x10E3/uL (ref 0.0–0.1)
Immature Granulocytes: 1 %
Lymphocytes Absolute: 1.4 x10E3/uL (ref 0.7–3.1)
Lymphs: 7 %
MCH: 28.4 pg (ref 26.6–33.0)
MCHC: 30.5 g/dL — ABNORMAL LOW (ref 31.5–35.7)
MCV: 93 fL (ref 79–97)
Monocytes Absolute: 1 x10E3/uL — ABNORMAL HIGH (ref 0.1–0.9)
Monocytes: 5 %
Neutrophils Absolute: 17.6 x10E3/uL — ABNORMAL HIGH (ref 1.4–7.0)
Neutrophils: 84 %
Platelets: 358 x10E3/uL (ref 150–450)
RBC: 4.05 x10E6/uL (ref 3.77–5.28)
RDW: 13.7 % (ref 11.7–15.4)
WBC: 20.7 x10E3/uL (ref 3.4–10.8)

## 2024-02-02 LAB — COMPREHENSIVE METABOLIC PANEL WITH GFR
ALT: 28 IU/L (ref 0–32)
AST: 30 IU/L (ref 0–40)
Albumin: 3.8 g/dL (ref 3.8–4.9)
Alkaline Phosphatase: 181 IU/L — ABNORMAL HIGH (ref 44–121)
BUN/Creatinine Ratio: 16 (ref 12–28)
BUN: 22 mg/dL (ref 8–27)
Bilirubin Total: 0.2 mg/dL (ref 0.0–1.2)
CO2: 26 mmol/L (ref 20–29)
Calcium: 11 mg/dL — ABNORMAL HIGH (ref 8.7–10.3)
Chloride: 95 mmol/L — ABNORMAL LOW (ref 96–106)
Creatinine, Ser: 1.39 mg/dL — ABNORMAL HIGH (ref 0.57–1.00)
Globulin, Total: 2.3 g/dL (ref 1.5–4.5)
Glucose: 85 mg/dL (ref 70–99)
Potassium: 3.5 mmol/L (ref 3.5–5.2)
Sodium: 137 mmol/L (ref 134–144)
Total Protein: 6.1 g/dL (ref 6.0–8.5)
eGFR: 43 mL/min/1.73 — ABNORMAL LOW (ref >59)

## 2024-02-02 LAB — POCT URINALYSIS DIP (CLINITEK)
Bilirubin, UA: NEGATIVE
Blood, UA: NEGATIVE
Glucose, UA: NEGATIVE mg/dL
Ketones, POC UA: NEGATIVE mg/dL
Nitrite, UA: NEGATIVE
POC PROTEIN,UA: NEGATIVE
Spec Grav, UA: 1.015 (ref 1.010–1.025)
Urobilinogen, UA: 0.2 U/dL
pH, UA: 6 (ref 5.0–8.0)

## 2024-02-02 MED ORDER — CIPROFLOXACIN HCL 250 MG PO TABS
250.0000 mg | ORAL_TABLET | Freq: Two times a day (BID) | ORAL | 0 refills | Status: AC
Start: 2024-02-02 — End: 2024-02-07

## 2024-02-02 NOTE — Progress Notes (Signed)
 Acute Office Visit  Subjective:    Patient ID: Molly Parrish, female    DOB: Dec 17, 1965, 58 y.o.   MRN: [id number]  Chief Complaint  Patient presents with   Urinary Tract Infection    Discussed the use of AI scribe software for clinical note transcription with the patient, who gave verbal consent to proceed.  History of Present Illness   Molly Parrish is a 58 year old female who presents with urinary symptoms and abdominal discomfort.  Urinary tract symptoms - Urinary urgency and discomfort in the lower groin area since January 24, 2024 - Sensation of incomplete bladder emptying and persistent urgency - No dysuria - Urine became very dark and concentrated over the weekend - Completed a week-long course of nitrofurantoin  (Macrobid ) on Monday, with worsening symptoms over the weekend - White blood cell count of 21,000 - Urine culture results pending  Fever and back pain - Mild fever up to 99.76F - Intensification of back pain over the weekend  Gastrointestinal symptoms - Episodes of abdominal discomfort with stomach swelling, nausea, and vomiting, particularly after eating - Abdomen feels hard - Difficulty breathing when eating - No relief with Gas-X - Symptoms are reminiscent of issues since gastric bypass surgery     Past Medical History:  Diagnosis Date   Alcohol dependence in remission (HCC) 10/04/2013   Alcoholic cirrhosis of liver (HCC)    Anemia    Anemia of chronic disease 08/08/2016   Arterial hypotension 05/08/2020   Bipolar disorder current episode depressed (HCC)    Brain TIA 02/19/2020   CKD (chronic kidney disease)    Episodic mood disorder (HCC) 11/07/2013   Generalized anxiety disorder    Generalized weakness 07/11/2020   GERD (gastroesophageal reflux disease)    H/O bariatric surgery 06/11/2020   Hypocalcemia 12/10/2015   Hypokalemia    Hyponatremia 02/19/2020   Intestinal malabsorption    Iron  deficiency anemia due to chronic blood loss  05/08/2020   Korsakoff syndrome (HCC)    Lumbar disc disease 10/19/2019   Microscopic hematuria 04/16/2021   Migraine 04/20/2014   Mild recurrent major depression (HCC) 04/19/2020   Multiple closed fractures of ribs of left side 06/03/2020   Formatting of this note might be different from the original. Added automatically from request for surgery [id number]   Neuropathy 01/31/2020   Obsessive-compulsive disorder 11/07/2013   Other osteoporosis without current pathological fracture    Peripheral edema 11/01/2020   Polypharmacy 06/12/2020   Portal hypertension (HCC)    Post-surgical hypothyroidism 12/10/2015   Primary insomnia 03/26/2021   Severe protein-calorie malnutrition (HCC) 03/26/2021   Shortness of breath 11/01/2020   Stage 3b chronic kidney disease (HCC) 04/16/2021   Stroke (HCC)     left basal ganglia stroke (hemorrhagic)   Telogen effluvium 03/26/2021   Ulcer of left foot with fat layer exposed (HCC) 10/04/2020   Ulcer of right foot, with fat layer exposed (HCC) 10/10/2020   Vitamin D  deficiency     Past Surgical History:  Procedure Laterality Date   BREAST EXCISIONAL BIOPSY Left    CATARACT EXTRACTION     GASTRIC BYPASS  2005   HEMORRHOID SURGERY     HERNIA REPAIR     metatarsus abductus surgery Left 1990   PARTIAL HYSTERECTOMY  05/1997   BL Ovaries remain. Done for fibroids.   THYROIDECTOMY  03/2013    Family History  Problem Relation Age of Onset   Breast cancer Mother    Osteoporosis Mother    Cancer  Mother        Breast, lung, skin.    Breast cancer Maternal Grandmother     Social History   Socioeconomic History   Marital status: Married    Spouse name: Not on file   Number of children: 1   Years of education: Not on file   Highest education level: Associate degree: occupational, Scientist, product/process development, or vocational program  Occupational History   Occupation: IT trainer    Comment: Retired.  Tobacco Use   Smoking status: Never   Smokeless tobacco: Never  Substance and Sexual  Activity   Alcohol use: Not Currently    Comment: history of alcoholism. sober since 2014.   Drug use: Never   Sexual activity: Yes  Other Topics Concern   Not on file  Social History Narrative   Right handed   Social Drivers of Health   Financial Resource Strain: Low Risk  (01/07/2024)   Overall Financial Resource Strain (CARDIA)    Difficulty of Paying Living Expenses: Not hard at all  Food Insecurity: No Food Insecurity (01/07/2024)   Hunger Vital Sign    Worried About Running Out of Food in the Last Year: Never true    Ran Out of Food in the Last Year: Never true  Transportation Needs: No Transportation Needs (01/07/2024)   PRAPARE - Administrator, Civil Service (Medical): No    Lack of Transportation (Non-Medical): No  Physical Activity: Inactive (01/07/2024)   Exercise Vital Sign    Days of Exercise per Week: 0 days    Minutes of Exercise per Session: Not on file  Stress: Stress Concern Present (01/07/2024)   [employer] of Occupational Health - Occupational Stress Questionnaire    Feeling of Stress: Very much  Social Connections: Moderately Isolated (01/07/2024)   Social Connection and Isolation Panel    Frequency of Communication with Friends and Family: More than three times a week    Frequency of Social Gatherings with Friends and Family: Once a week    Attends Religious Services: Never    Database administrator or Organizations: No    Attends Engineer, structural: Not on file    Marital Status: Married  Catering manager Violence: Not At Risk (05/20/2021)   Humiliation, Afraid, Rape, and Kick questionnaire    Fear of Current or Ex-Partner: No    Emotionally Abused: No    Physically Abused: No    Sexually Abused: No    Outpatient Medications Prior to Visit  Medication Sig Dispense Refill   acetaminophen  (TYLENOL ) 325 MG tablet Take by mouth.     ALPRAZolam  (XANAX ) 0.25 MG tablet Take 1 tablet by mouth twice daily as needed for anxiety 60  tablet 3   budesonide -formoterol  (SYMBICORT ) 160-4.5 MCG/ACT inhaler Inhale 2 puffs into the lungs 2 (two) times daily. 10.2 g 2   calcitRIOL (ROCALTROL) 0.5 MCG capsule Take 0.5 mcg by mouth 2 (two) times daily.     calcium carbonate (OSCAL) 1500 (600 Ca) MG TABS tablet Take 3,600 mg by mouth 2 (two) times daily with a meal.     CONSTULOSE 10 GM/15ML solution Take 20 g by mouth 2 (two) times daily.     Cyanocobalamin  (VITAMIN B 12 PO) Take 1,000 mcg by mouth daily.     cyclobenzaprine  (FLEXERIL ) 10 MG tablet Take 1 tablet by mouth three times daily as needed for muscle spasm 90 tablet 0   famotidine  (PEPCID ) 40 MG tablet Take 40 mg by mouth at bedtime.  folic acid  (FOLVITE ) 800 MCG tablet Take 800 mcg by mouth daily.     furosemide  (LASIX ) 40 MG tablet Take 2 tablets (80 mg total) by mouth 2 (two) times daily. 360 tablet 0   gabapentin  (NEURONTIN ) 100 MG capsule Take 1 capsule (100 mg total) by mouth 3 (three) times daily. 90 capsule 5   Glucagon  (GVOKE HYPOPEN  2-PACK) 0.5 MG/0.1ML SOAJ If glucose less than 60, use gvoke pen x 1. May repeat if not responding x 1 daily. Call EMS if not improving. 0.2 mL 2   hydrOXYzine  (VISTARIL ) 25 MG capsule TAKE 1 CAPSULE BY MOUTH THREE TIMES DAILY 90 capsule 0   Iron -Vitamins (GERITOL COMPLETE) TABS Take 1 tablet by mouth daily.     lamoTRIgine (LAMICTAL) 200 MG tablet Take 200 mg by mouth at bedtime.     magnesium oxide (MAG-OX) 400 MG tablet Take 400 mg by mouth daily.     omeprazole (PRILOSEC) 40 MG capsule Take 40 mg by mouth daily.     ondansetron  (ZOFRAN ) 4 MG tablet TAKE 1 TABLET BY MOUTH EVERY 8 HOURS AS NEEDED FOR NAUSEA AND VOMITING 60 tablet 0   propranolol (INDERAL) 10 MG tablet Take 5 mg by mouth at bedtime.     QUEtiapine (SEROQUEL) 50 MG tablet Take 100 mg by mouth at bedtime.     sertraline (ZOLOFT) 100 MG tablet Take 100 mg by mouth in the morning and at bedtime.     spironolactone  (ALDACTONE ) 50 MG tablet Take 2 tablets by mouth twice  daily 120 tablet 3   thiamine 100 MG tablet Take 100 mg by mouth daily.     traMADol  (ULTRAM ) 50 MG tablet TAKE 2 TABLETS BY MOUTH TWICE DAILY (Patient taking differently: Take 100 mg by mouth 2 (two) times daily as needed for severe pain (pain score 7-10).) 120 tablet 1   triamcinolone  (KENALOG ) 0.1 % paste APPLY  SMALL AMOUNT TWICE DAILY AS DIRECTED IN  MOUTH  OR  THROAT 5 g 0   Vitamin D , Ergocalciferol , (DRISDOL ) 1.25 MG (50000 UNIT) CAPS capsule Take 1 capsule (50,000 Units total) by mouth 2 (two) times a week. 24 capsule 0   zolpidem  (AMBIEN ) 10 MG tablet Take 1 tablet (10 mg total) by mouth at bedtime. 30 tablet 2   levothyroxine  (SYNTHROID ) 150 MCG tablet TAKE 1 TABLET BY MOUTH ONCE DAILY BEFORE BREAKFAST 90 tablet 0   midodrine  (PROAMATINE ) 2.5 MG tablet Take 1 tablet (2.5 mg total) by mouth 3 (three) times daily with meals. 90 tablet 0   No facility-administered medications prior to visit.    Allergies  Allergen Reactions   Cholestyramine     Rash and itching   Trintellix [Vortioxetine]     Review of Systems  Constitutional:  Negative for chills and fever.  HENT:  Negative for congestion and sore throat.   Respiratory:  Negative for cough and shortness of breath.   Cardiovascular:  Negative for chest pain.  Gastrointestinal:  Positive for abdominal pain. Negative for nausea.  Genitourinary:  Positive for hematuria. Negative for dysuria, flank pain, frequency and urgency.  Musculoskeletal:  Positive for back pain.       Objective:        02/02/2024   10:36 AM 02/01/2024    2:41 PM 01/07/2024   10:42 AM  Vitals with BMI  Height 5' 1.5 5' 1.5 5' 1.5  Weight 152 lbs 165 lbs 8 oz 161 lbs  BMI 28.26 30.77 29.93  Systolic 114 119 883  Diastolic  68 79 58  Pulse 90 79 94    No data found.   Physical Exam Vitals reviewed.  Constitutional:      Appearance: Normal appearance.  Neck:     Vascular: No carotid bruit.  Cardiovascular:     Rate and Rhythm: Normal rate  and regular rhythm.     Heart sounds: Normal heart sounds.  Pulmonary:     Effort: Pulmonary effort is normal. No respiratory distress.     Breath sounds: Normal breath sounds.  Abdominal:     General: Abdomen is flat. Bowel sounds are normal.     Palpations: Abdomen is soft.     Tenderness: There is abdominal tenderness (lower abdomen.).  Neurological:     Mental Status: She is alert and oriented to person, place, and time.  Psychiatric:        Mood and Affect: Mood normal.        Behavior: Behavior normal.     Health Maintenance Due  Topic Date Due   Medicare Annual Wellness (AWV)  Never done   MAMMOGRAM  09/18/2023   Hepatitis B Vaccines (1 of 3 - Risk 3-dose series) Never done   COVID-19 Vaccine (7 - Pfizer risk 2024-25 season) 12/24/2023       Topic Date Due   Hepatitis B Vaccines (1 of 3 - Risk 3-dose series) Never done     Lab Results  Component Value Date   TSH 0.119 (L) 01/07/2024   Lab Results  Component Value Date   WBC 20.7 (HH) 02/02/2024   HGB 11.5 02/02/2024   HCT 37.7 02/02/2024   MCV 93 02/02/2024   PLT 358 02/02/2024   Lab Results  Component Value Date   NA 137 02/02/2024   K 3.5 02/02/2024   CO2 26 02/02/2024   GLUCOSE 85 02/02/2024   BUN 22 02/02/2024   CREATININE 1.39 (H) 02/02/2024   BILITOT 0.2 02/02/2024   ALKPHOS 181 (H) 02/02/2024   AST 30 02/02/2024   ALT 28 02/02/2024   PROT 6.1 02/02/2024   PROT 6.0 02/02/2024   ALBUMIN 3.8 02/02/2024   CALCIUM 11.0 (H) 02/02/2024   ANIONGAP 14 02/01/2024   EGFR 43 (L) 02/02/2024   Lab Results  Component Value Date   CHOL 126 01/07/2024   Lab Results  Component Value Date   HDL 61 01/07/2024   Lab Results  Component Value Date   LDLCALC 48 01/07/2024   Lab Results  Component Value Date   TRIG 87 01/07/2024   Lab Results  Component Value Date   CHOLHDL 2.1 01/07/2024   No results found for: HGBA1C     Assessment & Plan:  Gross hematuria Assessment & Plan: Check UA  and CBC  Orders: -     POCT URINALYSIS DIP (CLINITEK) -     CBC with Differential/Platelet  Acute cystitis without hematuria Assessment & Plan: Symptoms worsened post-nitrofurantoin . Awaiting urine culture for sensitivity. - Recheck CBC for WBC count. - Obtain urine culture results for antibiotic sensitivity. - Prescribe new antibiotic based on culture results.  Orders: -     Urine Culture -     CBC with Differential/Platelet -     Comprehensive metabolic panel with GFR -     Ciprofloxacin  HCl; Take 1 tablet (250 mg total) by mouth 2 (two) times daily for 5 days.  Dispense: 10 tablet; Refill: 0  Acute low back pain, unspecified back pain laterality, unspecified whether sciatica present Assessment & Plan: Back pain started after antibiotics  for UTI. Further investigation if pain persists.   History of gastric bypass Assessment & Plan: Nausea, vomiting, and abdominal distension postprandial likely related to gastric bypass. Gas-X ineffective, possible gas build-up.   Abnormal blood cell count -     Protein electrophoresis -     Specimen status report     Meds ordered this encounter  Medications   ciprofloxacin  (CIPRO ) 250 MG tablet    Sig: Take 1 tablet (250 mg total) by mouth 2 (two) times daily for 5 days.    Dispense:  10 tablet    Refill:  0    Orders Placed This Encounter  Procedures   Urine Culture   CBC with Differential/Platelet   Comprehensive metabolic panel with GFR   Protein electrophoresis   Specimen status report   POCT URINALYSIS DIP (CLINITEK)      Follow-up: Return previously scheduled appointment..  An After Visit Summary was printed and given to the patient.   LILLETTE Kato I Leal-Borjas,acting as a scribe for Abigail Free, MD.,have documented all relevant documentation on the behalf of Abigail Free, MD,as directed by  Abigail Free, MD while in the presence of Abigail Free, MD.   Abigail Free, MD [facility] [phone]

## 2024-02-02 NOTE — Patient Instructions (Signed)
 Sent ciprofloxacin  for urinary tract infection.  Checking labs.

## 2024-02-03 ENCOUNTER — Other Ambulatory Visit: Payer: Self-pay | Admitting: Family Medicine

## 2024-02-03 ENCOUNTER — Telehealth: Payer: Self-pay

## 2024-02-03 ENCOUNTER — Encounter: Payer: Self-pay | Admitting: Family Medicine

## 2024-02-03 ENCOUNTER — Ambulatory Visit: Payer: Self-pay | Admitting: Family Medicine

## 2024-02-03 DIAGNOSIS — D72825 Bandemia: Secondary | ICD-10-CM

## 2024-02-03 LAB — URINE CULTURE

## 2024-02-03 MED ORDER — LEVOTHYROXINE SODIUM 150 MCG PO TABS: 150.0000 ug | ORAL_TABLET | Freq: Every day | ORAL | 0 refills | Status: DC

## 2024-02-03 NOTE — Telephone Encounter (Signed)
 Patient is requested levothyroxine  but I don't see it on her medication list. Please advise

## 2024-02-03 NOTE — Telephone Encounter (Signed)
 white blood cell (WBC) count of 20.7

## 2024-02-04 LAB — PROTEIN ELECTROPHORESIS
A/G Ratio: 1.4 (ref 0.7–1.7)
Albumin ELP: 3.5 g/dL (ref 2.9–4.4)
Alpha 1: 0.2 g/dL (ref 0.0–0.4)
Alpha 2: 0.7 g/dL (ref 0.4–1.0)
Beta: 0.9 g/dL (ref 0.7–1.3)
Gamma Globulin: 0.7 g/dL (ref 0.4–1.8)
Globulin, Total: 2.5 g/dL (ref 2.2–3.9)
Total Protein: 6 g/dL (ref 6.0–8.5)

## 2024-02-04 LAB — SPECIMEN STATUS REPORT

## 2024-02-06 DIAGNOSIS — M545 Low back pain, unspecified: Secondary | ICD-10-CM | POA: Insufficient documentation

## 2024-02-06 DIAGNOSIS — Z9884 Bariatric surgery status: Secondary | ICD-10-CM | POA: Insufficient documentation

## 2024-02-06 DIAGNOSIS — R310 Gross hematuria: Secondary | ICD-10-CM | POA: Insufficient documentation

## 2024-02-06 NOTE — Assessment & Plan Note (Signed)
 Nausea, vomiting, and abdominal distension postprandial likely related to gastric bypass. Gas-X ineffective, possible gas build-up.

## 2024-02-06 NOTE — Assessment & Plan Note (Signed)
 Back pain started after antibiotics for UTI. Further investigation if pain persists.

## 2024-02-06 NOTE — Assessment & Plan Note (Signed)
 Check UA and CBC

## 2024-02-06 NOTE — Assessment & Plan Note (Signed)
 Symptoms worsened post-nitrofurantoin . Awaiting urine culture for sensitivity. - Recheck CBC for WBC count. - Obtain urine culture results for antibiotic sensitivity. - Prescribe new antibiotic based on culture results.

## 2024-02-07 ENCOUNTER — Other Ambulatory Visit: Payer: Self-pay

## 2024-02-07 NOTE — Progress Notes (Signed)
 Complex Care Management Note Care Guide Note  02/07/2024 Name: Molly Parrish MRN: [id number] DOB: 12/25/1965   Complex Care Management Outreach Attempts: A second unsuccessful outreach was attempted today to offer the patient with information about available complex care management services.  Follow Up Plan:  Additional outreach attempts will be made to offer the patient complex care management information and services.   Encounter Outcome:  No Answer  [facility]  [facility], [facility] Health Care Management Assistant Direct Dial: [phone]  Fax: [phone]

## 2024-02-13 ENCOUNTER — Other Ambulatory Visit: Payer: Self-pay | Admitting: Family Medicine

## 2024-02-14 ENCOUNTER — Other Ambulatory Visit

## 2024-02-14 NOTE — Progress Notes (Signed)
 Complex Care Management Note Care Guide Note  02/14/2024 Name: Molly Parrish MRN: [id number] DOB: 1965/07/27   Complex Care Management Outreach Attempts: A third unsuccessful outreach was attempted today to offer the patient with information about available complex care management services.  Follow Up Plan:  No further outreach attempts will be made at this time. We have been unable to contact the patient to offer or enroll patient in complex care management services.  Encounter Outcome:  No Answer  [facility]  [facility], [facility] Health Care Management Assistant Direct Dial: [phone]  Fax: [phone]

## 2024-02-15 ENCOUNTER — Other Ambulatory Visit

## 2024-02-15 ENCOUNTER — Ambulatory Visit

## 2024-02-15 ENCOUNTER — Encounter: Payer: Self-pay | Admitting: Family Medicine

## 2024-02-15 LAB — COMPREHENSIVE METABOLIC PANEL WITH GFR
ALT: 33 IU/L — ABNORMAL HIGH (ref 0–32)
AST: 29 IU/L (ref 0–40)
Albumin: 3.3 g/dL — ABNORMAL LOW (ref 3.8–4.9)
Alkaline Phosphatase: 142 IU/L — ABNORMAL HIGH (ref 44–121)
BUN/Creatinine Ratio: 22 (ref 12–28)
BUN: 28 mg/dL — ABNORMAL HIGH (ref 8–27)
Bilirubin Total: 0.3 mg/dL (ref 0.0–1.2)
CO2: 23 mmol/L (ref 20–29)
Calcium: 10 mg/dL (ref 8.7–10.3)
Chloride: 98 mmol/L (ref 96–106)
Creatinine, Ser: 1.25 mg/dL — ABNORMAL HIGH (ref 0.57–1.00)
Globulin, Total: 2.4 g/dL (ref 1.5–4.5)
Glucose: 97 mg/dL (ref 70–99)
Potassium: 3.3 mmol/L — ABNORMAL LOW (ref 3.5–5.2)
Sodium: 137 mmol/L (ref 134–144)
Total Protein: 5.7 g/dL — ABNORMAL LOW (ref 6.0–8.5)
eGFR: 49 mL/min/1.73 — ABNORMAL LOW (ref >59)

## 2024-02-17 ENCOUNTER — Other Ambulatory Visit: Payer: Self-pay

## 2024-02-17 ENCOUNTER — Other Ambulatory Visit

## 2024-02-17 DIAGNOSIS — R799 Abnormal finding of blood chemistry, unspecified: Secondary | ICD-10-CM | POA: Diagnosis not present

## 2024-02-17 NOTE — Progress Notes (Signed)
 Order for CBC is in. Patient has lab appointment today at 1:30.

## 2024-02-18 ENCOUNTER — Ambulatory Visit: Payer: Self-pay | Admitting: Family Medicine

## 2024-02-18 LAB — CBC WITH DIFFERENTIAL/PLATELET
Basophils Absolute: 0.1 x10E3/uL (ref 0.0–0.2)
Basos: 1 %
EOS (ABSOLUTE): 0.4 x10E3/uL (ref 0.0–0.4)
Eos: 3 %
Hematocrit: 33.7 % — ABNORMAL LOW (ref 34.0–46.6)
Hemoglobin: 10.3 g/dL — ABNORMAL LOW (ref 11.1–15.9)
Immature Grans (Abs): 0.1 x10E3/uL (ref 0.0–0.1)
Immature Granulocytes: 1 %
Lymphocytes Absolute: 1.5 x10E3/uL (ref 0.7–3.1)
Lymphs: 12 %
MCH: 27.6 pg (ref 26.6–33.0)
MCHC: 30.6 g/dL — ABNORMAL LOW (ref 31.5–35.7)
MCV: 90 fL (ref 79–97)
Monocytes Absolute: 1.2 x10E3/uL — ABNORMAL HIGH (ref 0.1–0.9)
Monocytes: 10 %
Neutrophils Absolute: 9.2 x10E3/uL — ABNORMAL HIGH (ref 1.4–7.0)
Neutrophils: 73 %
Platelets: 401 x10E3/uL (ref 150–450)
RBC: 3.73 x10E6/uL — ABNORMAL LOW (ref 3.77–5.28)
RDW: 13.2 % (ref 11.7–15.4)
WBC: 12.5 x10E3/uL — ABNORMAL HIGH (ref 3.4–10.8)

## 2024-02-20 ENCOUNTER — Ambulatory Visit: Payer: Self-pay | Admitting: Family Medicine

## 2024-02-21 ENCOUNTER — Other Ambulatory Visit

## 2024-02-24 ENCOUNTER — Other Ambulatory Visit: Payer: Self-pay | Admitting: Family Medicine

## 2024-02-25 ENCOUNTER — Other Ambulatory Visit: Payer: Self-pay | Admitting: Family Medicine

## 2024-02-25 DIAGNOSIS — I959 Hypotension, unspecified: Secondary | ICD-10-CM

## 2024-02-27 ENCOUNTER — Other Ambulatory Visit: Payer: Self-pay | Admitting: Family Medicine

## 2024-02-27 DIAGNOSIS — I959 Hypotension, unspecified: Secondary | ICD-10-CM

## 2024-02-28 ENCOUNTER — Telehealth: Payer: Self-pay

## 2024-02-28 ENCOUNTER — Encounter: Payer: Self-pay | Admitting: Family Medicine

## 2024-02-28 NOTE — Telephone Encounter (Signed)
 Message sent to [facility] via TEAMS, unable to send via epic.

## 2024-02-29 ENCOUNTER — Inpatient Hospital Stay: Attending: Oncology

## 2024-02-29 ENCOUNTER — Inpatient Hospital Stay

## 2024-02-29 DIAGNOSIS — D72829 Elevated white blood cell count, unspecified: Secondary | ICD-10-CM | POA: Diagnosis not present

## 2024-02-29 DIAGNOSIS — N1831 Chronic kidney disease, stage 3a: Secondary | ICD-10-CM | POA: Insufficient documentation

## 2024-02-29 DIAGNOSIS — N189 Chronic kidney disease, unspecified: Secondary | ICD-10-CM

## 2024-02-29 DIAGNOSIS — Z79899 Other long term (current) drug therapy: Secondary | ICD-10-CM | POA: Diagnosis not present

## 2024-02-29 DIAGNOSIS — D631 Anemia in chronic kidney disease: Secondary | ICD-10-CM | POA: Insufficient documentation

## 2024-02-29 LAB — CBC WITH DIFFERENTIAL (CANCER CENTER ONLY)
Abs Immature Granulocytes: 0.1 K/uL — ABNORMAL HIGH (ref 0.00–0.07)
Basophils Absolute: 0.1 K/uL (ref 0.0–0.1)
Basophils Relative: 1 %
Eosinophils Absolute: 0.3 K/uL (ref 0.0–0.5)
Eosinophils Relative: 2 %
HCT: 33.3 % — ABNORMAL LOW (ref 36.0–46.0)
Hemoglobin: 10 g/dL — ABNORMAL LOW (ref 12.0–15.0)
Immature Granulocytes: 1 %
Lymphocytes Relative: 14 %
Lymphs Abs: 1.9 K/uL (ref 0.7–4.0)
MCH: 27 pg (ref 26.0–34.0)
MCHC: 30 g/dL (ref 30.0–36.0)
MCV: 90 fL (ref 80.0–100.0)
Monocytes Absolute: 0.9 K/uL (ref 0.1–1.0)
Monocytes Relative: 7 %
Neutro Abs: 9.9 K/uL — ABNORMAL HIGH (ref 1.7–7.7)
Neutrophils Relative %: 75 %
Platelet Count: 391 K/uL (ref 150–400)
RBC: 3.7 MIL/uL — ABNORMAL LOW (ref 3.87–5.11)
RDW: 14.8 % (ref 11.5–15.5)
WBC Count: 13.2 K/uL — ABNORMAL HIGH (ref 4.0–10.5)
nRBC: 0 % (ref 0.0–0.2)

## 2024-02-29 NOTE — Progress Notes (Signed)
 Hem 10.0- holding injection

## 2024-03-02 ENCOUNTER — Other Ambulatory Visit: Payer: Self-pay | Admitting: Family Medicine

## 2024-03-03 ENCOUNTER — Other Ambulatory Visit: Payer: Medicare HMO

## 2024-03-09 ENCOUNTER — Other Ambulatory Visit: Payer: Self-pay | Admitting: Family Medicine

## 2024-03-09 DIAGNOSIS — R110 Nausea: Secondary | ICD-10-CM

## 2024-03-18 ENCOUNTER — Other Ambulatory Visit: Payer: Self-pay

## 2024-03-27 ENCOUNTER — Inpatient Hospital Stay

## 2024-03-27 ENCOUNTER — Inpatient Hospital Stay: Attending: Oncology

## 2024-03-27 DIAGNOSIS — D631 Anemia in chronic kidney disease: Secondary | ICD-10-CM | POA: Insufficient documentation

## 2024-03-27 DIAGNOSIS — Z79899 Other long term (current) drug therapy: Secondary | ICD-10-CM | POA: Insufficient documentation

## 2024-03-27 DIAGNOSIS — N1831 Chronic kidney disease, stage 3a: Secondary | ICD-10-CM | POA: Diagnosis not present

## 2024-03-27 DIAGNOSIS — D72829 Elevated white blood cell count, unspecified: Secondary | ICD-10-CM | POA: Diagnosis not present

## 2024-03-27 LAB — CBC WITH DIFFERENTIAL (CANCER CENTER ONLY)
Abs Immature Granulocytes: 0.07 K/uL (ref 0.00–0.07)
Basophils Absolute: 0.1 K/uL (ref 0.0–0.1)
Basophils Relative: 1 %
Eosinophils Absolute: 0.4 K/uL (ref 0.0–0.5)
Eosinophils Relative: 3 %
HCT: 35.3 % — ABNORMAL LOW (ref 36.0–46.0)
Hemoglobin: 10.8 g/dL — ABNORMAL LOW (ref 12.0–15.0)
Immature Granulocytes: 1 %
Lymphocytes Relative: 15 %
Lymphs Abs: 1.7 K/uL (ref 0.7–4.0)
MCH: 27.8 pg (ref 26.0–34.0)
MCHC: 30.6 g/dL (ref 30.0–36.0)
MCV: 90.7 fL (ref 80.0–100.0)
Monocytes Absolute: 0.8 K/uL (ref 0.1–1.0)
Monocytes Relative: 7 %
Neutro Abs: 8.2 K/uL — ABNORMAL HIGH (ref 1.7–7.7)
Neutrophils Relative %: 73 %
Platelet Count: 343 K/uL (ref 150–400)
RBC: 3.89 MIL/uL (ref 3.87–5.11)
RDW: 15 % (ref 11.5–15.5)
WBC Count: 11.2 K/uL — ABNORMAL HIGH (ref 4.0–10.5)
nRBC: 0 % (ref 0.0–0.2)

## 2024-03-28 ENCOUNTER — Inpatient Hospital Stay

## 2024-04-03 ENCOUNTER — Other Ambulatory Visit: Payer: Self-pay | Admitting: Family Medicine

## 2024-04-07 ENCOUNTER — Encounter: Payer: Self-pay | Admitting: Family Medicine

## 2024-04-08 ENCOUNTER — Other Ambulatory Visit: Payer: Self-pay | Admitting: Family Medicine

## 2024-04-10 ENCOUNTER — Encounter: Payer: Self-pay | Admitting: Family Medicine

## 2024-04-10 DIAGNOSIS — J189 Pneumonia, unspecified organism: Secondary | ICD-10-CM | POA: Diagnosis not present

## 2024-04-10 DIAGNOSIS — R7989 Other specified abnormal findings of blood chemistry: Secondary | ICD-10-CM | POA: Diagnosis not present

## 2024-04-10 DIAGNOSIS — R9431 Abnormal electrocardiogram [ECG] [EKG]: Secondary | ICD-10-CM | POA: Diagnosis not present

## 2024-04-10 DIAGNOSIS — E876 Hypokalemia: Secondary | ICD-10-CM | POA: Diagnosis not present

## 2024-04-10 DIAGNOSIS — R918 Other nonspecific abnormal finding of lung field: Secondary | ICD-10-CM | POA: Diagnosis not present

## 2024-04-10 DIAGNOSIS — N189 Chronic kidney disease, unspecified: Secondary | ICD-10-CM | POA: Diagnosis not present

## 2024-04-10 DIAGNOSIS — R079 Chest pain, unspecified: Secondary | ICD-10-CM | POA: Diagnosis not present

## 2024-04-10 DIAGNOSIS — E870 Hyperosmolality and hypernatremia: Secondary | ICD-10-CM | POA: Diagnosis not present

## 2024-04-10 DIAGNOSIS — D649 Anemia, unspecified: Secondary | ICD-10-CM | POA: Diagnosis not present

## 2024-04-11 DIAGNOSIS — D649 Anemia, unspecified: Secondary | ICD-10-CM | POA: Diagnosis not present

## 2024-04-11 DIAGNOSIS — F419 Anxiety disorder, unspecified: Secondary | ICD-10-CM | POA: Diagnosis not present

## 2024-04-11 DIAGNOSIS — R0602 Shortness of breath: Secondary | ICD-10-CM | POA: Diagnosis not present

## 2024-04-11 DIAGNOSIS — G8929 Other chronic pain: Secondary | ICD-10-CM | POA: Diagnosis not present

## 2024-04-11 DIAGNOSIS — J9601 Acute respiratory failure with hypoxia: Secondary | ICD-10-CM | POA: Diagnosis not present

## 2024-04-11 DIAGNOSIS — D75839 Thrombocytosis, unspecified: Secondary | ICD-10-CM | POA: Diagnosis not present

## 2024-04-11 DIAGNOSIS — R079 Chest pain, unspecified: Secondary | ICD-10-CM | POA: Diagnosis not present

## 2024-04-11 DIAGNOSIS — M1990 Unspecified osteoarthritis, unspecified site: Secondary | ICD-10-CM | POA: Diagnosis not present

## 2024-04-11 DIAGNOSIS — F319 Bipolar disorder, unspecified: Secondary | ICD-10-CM | POA: Diagnosis not present

## 2024-04-11 DIAGNOSIS — J159 Unspecified bacterial pneumonia: Secondary | ICD-10-CM | POA: Diagnosis not present

## 2024-04-11 DIAGNOSIS — Z87442 Personal history of urinary calculi: Secondary | ICD-10-CM | POA: Diagnosis not present

## 2024-04-11 DIAGNOSIS — D509 Iron deficiency anemia, unspecified: Secondary | ICD-10-CM | POA: Diagnosis not present

## 2024-04-11 DIAGNOSIS — E890 Postprocedural hypothyroidism: Secondary | ICD-10-CM | POA: Diagnosis not present

## 2024-04-11 DIAGNOSIS — E860 Dehydration: Secondary | ICD-10-CM | POA: Diagnosis not present

## 2024-04-11 DIAGNOSIS — Z85850 Personal history of malignant neoplasm of thyroid: Secondary | ICD-10-CM | POA: Diagnosis not present

## 2024-04-11 DIAGNOSIS — K7030 Alcoholic cirrhosis of liver without ascites: Secondary | ICD-10-CM | POA: Diagnosis not present

## 2024-04-11 DIAGNOSIS — J189 Pneumonia, unspecified organism: Secondary | ICD-10-CM | POA: Diagnosis not present

## 2024-04-11 DIAGNOSIS — F1011 Alcohol abuse, in remission: Secondary | ICD-10-CM | POA: Diagnosis not present

## 2024-04-11 DIAGNOSIS — Z87440 Personal history of urinary (tract) infections: Secondary | ICD-10-CM | POA: Diagnosis not present

## 2024-04-11 DIAGNOSIS — N189 Chronic kidney disease, unspecified: Secondary | ICD-10-CM | POA: Diagnosis not present

## 2024-04-11 DIAGNOSIS — E222 Syndrome of inappropriate secretion of antidiuretic hormone: Secondary | ICD-10-CM | POA: Diagnosis not present

## 2024-04-11 DIAGNOSIS — D72829 Elevated white blood cell count, unspecified: Secondary | ICD-10-CM | POA: Diagnosis not present

## 2024-04-11 DIAGNOSIS — R7989 Other specified abnormal findings of blood chemistry: Secondary | ICD-10-CM | POA: Diagnosis not present

## 2024-04-11 DIAGNOSIS — K449 Diaphragmatic hernia without obstruction or gangrene: Secondary | ICD-10-CM | POA: Diagnosis not present

## 2024-04-11 DIAGNOSIS — M545 Low back pain, unspecified: Secondary | ICD-10-CM | POA: Diagnosis not present

## 2024-04-11 DIAGNOSIS — R9431 Abnormal electrocardiogram [ECG] [EKG]: Secondary | ICD-10-CM | POA: Diagnosis not present

## 2024-04-11 DIAGNOSIS — E876 Hypokalemia: Secondary | ICD-10-CM | POA: Diagnosis not present

## 2024-04-11 DIAGNOSIS — Z8673 Personal history of transient ischemic attack (TIA), and cerebral infarction without residual deficits: Secondary | ICD-10-CM | POA: Diagnosis not present

## 2024-04-11 DIAGNOSIS — K219 Gastro-esophageal reflux disease without esophagitis: Secondary | ICD-10-CM | POA: Diagnosis not present

## 2024-04-11 DIAGNOSIS — E870 Hyperosmolality and hypernatremia: Secondary | ICD-10-CM | POA: Diagnosis not present

## 2024-04-11 DIAGNOSIS — N1831 Chronic kidney disease, stage 3a: Secondary | ICD-10-CM | POA: Diagnosis not present

## 2024-04-11 DIAGNOSIS — Z7951 Long term (current) use of inhaled steroids: Secondary | ICD-10-CM | POA: Diagnosis not present

## 2024-04-14 ENCOUNTER — Telehealth: Payer: Self-pay

## 2024-04-14 NOTE — Transitions of Care (Post Inpatient/ED Visit) (Signed)
   04/14/2024  Name: Molly Parrish MRN: [id number] DOB: 1965-11-10  Today's TOC FU Call Status: Today's TOC FU Call Status:: Unsuccessful Call (2nd Attempt) Unsuccessful Call (2nd Attempt) Date: 04/14/24  Attempted to reach the patient regarding the most recent Inpatient/ED visit.  Follow Up Plan: Additional outreach attempts will be made to reach the patient to complete the Transitions of Care (Post Inpatient/ED visit) call.   Shona Prow RN, CCM   [facility] RN Care Manager [phone]

## 2024-04-14 NOTE — Transitions of Care (Post Inpatient/ED Visit) (Signed)
   04/14/2024  Name: Molly Parrish MRN: [id number] DOB: Aug 12, 1965  Today's TOC FU Call Status: Today's TOC FU Call Status:: Unsuccessful Call (1st Attempt) Unsuccessful Call (1st Attempt) Date: 04/14/24  Attempted to reach the patient regarding the most recent Inpatient/ED visit.  Follow Up Plan: Additional outreach attempts will be made to reach the patient to complete the Transitions of Care (Post Inpatient/ED visit) call.   Shona Prow RN, CCM [place]  [facility] RN Care Manager [phone]

## 2024-04-16 ENCOUNTER — Other Ambulatory Visit: Payer: Self-pay | Admitting: Family Medicine

## 2024-04-16 DIAGNOSIS — R110 Nausea: Secondary | ICD-10-CM

## 2024-04-17 ENCOUNTER — Telehealth: Payer: Self-pay

## 2024-04-17 ENCOUNTER — Encounter: Payer: Self-pay | Admitting: Family Medicine

## 2024-04-17 ENCOUNTER — Other Ambulatory Visit: Payer: Self-pay | Admitting: Family Medicine

## 2024-04-17 NOTE — Transitions of Care (Post Inpatient/ED Visit) (Signed)
 04/17/2024  Name: Molly Parrish MRN: [id number] DOB: 04-24-1966  Today's TOC FU Call Status: Today's TOC FU Call Status:: Successful TOC FU Call Completed TOC FU Call Complete Date: 04/17/24 Patient's Name and Date of Birth confirmed.  Transition Care Management Follow-up Telephone Call Type of Discharge: Inpatient Admission How have you been since you were released from the hospital?: Better Any questions or concerns?: Yes Patient Questions/Concerns:: patient reports feels like I have cinderblocks laying on my chest - new symptom - notified PCP via EPIC secure chat  Items Reviewed: Did you receive and understand the discharge instructions provided?: Yes Medications obtained,verified, and reconciled?: Yes (Medications Reviewed) Any new allergies since your discharge?: No Dietary orders reviewed?: NA Do you have support at home?: Yes People in Home [RPT]: spouse Name of Support/Comfort Primary Source: Spouse is at home and assists as needed  Medications Reviewed Today: Medications Reviewed Today     Reviewed by Lauro Shona LABOR, RN (Registered Nurse) on 04/17/24 at 1210  Med List Status: <None>   Medication Order Taking? Sig Documenting Provider Last Dose Status Informant  acetaminophen  (TYLENOL ) 325 MG tablet [id number] Yes Take by mouth. [provider]  Active   ALPRAZolam  (XANAX ) 0.25 MG tablet [id number] Yes Take 1 tablet by mouth twice daily as needed for anxiety Cox, Kirsten, MD  Active   benzonatate (TESSALON) 100 MG capsule [id number] Yes Take 100 mg by mouth 3 (three) times daily as needed. [provider]  Active   budesonide -formoterol  (SYMBICORT ) 160-4.5 MCG/ACT inhaler [id number]  Inhale 2 puffs into the lungs 2 (two) times daily.  Patient not taking: Reported on 04/17/2024   Sirivol, Mamatha, MD  Active   calcitRIOL (ROCALTROL) 0.5 MCG capsule [id number] Yes Take 0.5 mcg by mouth 2 (two) times daily. [provider]  Active    calcium carbonate (OSCAL) 1500 (600 Ca) MG TABS tablet [id number] Yes Take 3,600 mg by mouth 2 (two) times daily with a meal. [provider]  Active   CONSTULOSE 10 GM/15ML solution [id number] Yes Take 20 g by mouth 2 (two) times daily. [provider]  Active   Cyanocobalamin  (VITAMIN B 12 PO) [id number]  Take 1,000 mcg by mouth daily.  Patient not taking: Reported on 04/17/2024   [provider]  Active   cyclobenzaprine  (FLEXERIL ) 10 MG tablet [id number] Yes Take 1 tablet by mouth three times daily as needed for muscle spasm Sirivol, Mamatha, MD  Active   famotidine  (PEPCID ) 40 MG tablet [id number] Yes Take 40 mg by mouth at bedtime.  Patient taking differently: Take 40 mg by mouth at bedtime. New on Pantoprazole  from Hospital discharge 9/25 and has Omeprazole - discuss with PCP 04/18/24   [provider]  Active   folic acid  (FOLVITE ) 800 MCG tablet [id number] Yes Take 800 mcg by mouth daily.  Patient taking differently: Take 800 mcg by mouth daily. Not on 04/13/24 discharge - discuss with PCP 04/18/24   [provider]  Active   furosemide  (LASIX ) 40 MG tablet [id number] Yes Take 2 tablets by mouth twice daily Cox, Kirsten, MD  Active   gabapentin  (NEURONTIN ) 100 MG capsule [id number] Yes Take 1 capsule (100 mg total) by mouth 3 (three) times daily. Cox, Kirsten, MD  Active   Glucagon  (GVOKE HYPOPEN  2-PACK) 0.5 MG/0.1ML EMMANUEL [id number] Yes If glucose less than 60, use gvoke pen x 1. May repeat if not responding x 1 daily. Call EMS if not improving. CoxAbigail, MD  Active   hydrOXYzine  (VISTARIL )  25 MG capsule [id number] Yes TAKE 1 CAPSULE BY MOUTH THREE TIMES DAILY Cox, Kirsten, MD  Active   Iron -Vitamins (GERITOL COMPLETE) TABS [id number] Yes Take 1 tablet by mouth daily. [provider]  Active   lamoTRIgine (LAMICTAL) 200 MG tablet [id number] Yes Take 200 mg by mouth at bedtime. [provider]  Active   levofloxacin (LEVAQUIN) 500 MG tablet  [id number] Yes Take 500 mg by mouth daily. [provider]  Active   levothyroxine  (SYNTHROID ) 150 MCG tablet [id number] Yes Take 1 tablet (150 mcg total) by mouth daily before breakfast. Cox, Kirsten, MD  Active   magnesium oxide (MAG-OX) 400 MG tablet [id number] Yes Take 400 mg by mouth daily. [provider]  Active   midodrine  (PROAMATINE ) 2.5 MG tablet [id number] Yes TAKE 1 TABLET BY MOUTH THREE TIMES DAILY WITH MEALS Cox, Kirsten, MD  Active   omeprazole (PRILOSEC) 40 MG capsule [id number] Yes Take 40 mg by mouth daily.  Patient taking differently: Take 40 mg by mouth daily. Not on 04/13/24 hospital discharge discuss with PCP 04/18/24   [provider]  Active   ondansetron  (ZOFRAN ) 4 MG tablet [id number] Yes TAKE 1 TABLET BY MOUTH EVERY 8 HOURS AS NEEDED FOR NAUSEA AND VOMITING Cox, Kirsten, MD  Active   pantoprazole  (PROTONIX ) 40 MG tablet [id number] Yes Take 40 mg by mouth daily. [provider]  Active   propranolol (INDERAL) 10 MG tablet [id number] Yes Take 5 mg by mouth at bedtime.  Patient taking differently: Take 5 mg by mouth at bedtime. Was not listed on 04/13/24 hospital discharge -discuss with PCP at 04/18/24   [provider]  Active   QUEtiapine (SEROQUEL) 50 MG tablet [id number] Yes Take 100 mg by mouth at bedtime. [provider]  Active   sertraline (ZOLOFT) 100 MG tablet [id number] Yes Take 100 mg by mouth in the morning and at bedtime. [provider]  Active   spironolactone  (ALDACTONE ) 50 MG tablet [id number] Yes Take 2 tablets by mouth twice daily Cox, Kirsten, MD  Active   thiamine 100 MG tablet [id number]  Take 100 mg by mouth daily. [provider]  Consider Medication Status and Discontinue (No longer needed (for PRN medications))   traMADol  (ULTRAM ) 50 MG tablet [id number]  TAKE 2 TABLETS BY MOUTH TWICE DAILY  Patient taking differently: Take 100 mg by mouth 2 (two) times daily as needed for severe pain (pain score  7-10).   Cox, Abigail, MD  Consider Medication Status and Discontinue (No longer needed (for PRN medications))   triamcinolone  (KENALOG ) 0.1 % paste [id number]  APPLY  SMALL AMOUNT TWICE DAILY AS DIRECTED IN  MOUTH  OR  THROAT Cox, Kirsten, MD  Consider Medication Status and Discontinue (No longer needed (for PRN medications))   Vitamin D , Ergocalciferol , (DRISDOL ) 1.25 MG (50000 UNIT) CAPS capsule [id number] Yes Take 1 capsule (50,000 Units total) by mouth 2 (two) times a week.  Patient taking differently: Take 20,000 Units by mouth 2 (two) times a week. Patient states she takes Vit D 3 twice weekly   Cox, Kirsten, MD  Active   zolpidem  (AMBIEN ) 10 MG tablet [id number] Yes TAKE 1 TABLET BY MOUTH AT BEDTIME Cox, Kirsten, MD  Active             Home Care and Equipment/Supplies: Were Home Health Services Ordered?: No Any new equipment or medical supplies ordered?: NA (using incentive spirometer every hour while a wake - states she's below 1000)  Functional Questionnaire: Do you  need assistance with bathing/showering or dressing?: No Do you need assistance with meal preparation?: No Do you need assistance with eating?: No Do you have difficulty maintaining continence: No Do you need assistance with getting out of bed/getting out of a chair/moving?: No Do you have difficulty managing or taking your medications?: No  Follow up appointments reviewed: PCP Follow-up appointment confirmed?: Yes Date of PCP follow-up appointment?: 04/18/24 Follow-up Provider: [facility] Follow-up appointment confirmed?: NA Do you need transportation to your follow-up appointment?: No Do you understand care options if your condition(s) worsen?: Yes-patient verbalized understanding  SDOH Interventions Today    Flowsheet Row Most Recent Value  SDOH Interventions   Food Insecurity Interventions Intervention Not Indicated  Housing Interventions Intervention Not Indicated  Transportation  Interventions Intervention Not Indicated  Utilities Interventions Intervention Not Indicated    Goals Addressed             This Visit's Progress    VBCI Transitions of Care (TOC) Care Plan       Problems:  Recent Hospitalization for treatment of Multifocal pneumonia, CAP bacterial pneumonia-  Patient completed TOC RN initial call and spoke clearly and appeared to be at ease and denied any respiratory issues other than non-productive cough - states she is using IS every hour while awake and getting it up to 1000 reported feels like I have cinder blocks on my chest and states it's new - reported O2 saturation 90% on room air - TOC RN sent secure chat message to PCP in EPIC to Dr Cox making her aware - [facility] RN asked patient if she felt she needed to seek treatment now and patient said she is comfortable waiting for her scheduled appointment tomorrow with Dr Sherre - Patient agrees to seek treatment if this worsens.   Goal:  Over the next 30 days, the patient will not experience hospital readmission  Interventions:  Transitions of Care: Doctor Visits  - discussed the importance of doctor visits Communication with Dr Sherre re: patient feeling like she has cinder blocks sitting on my chest - Patient discharged with pneumonia - reviewed with patient symptoms associated with pneumonia and when to seek medical treatment   Reviewed proper use of Incentive Spirometer and medications - [place] medication list updated and patient to discuss medications she states she gets from GI with PCP at tomorrow's appointments.  Reviewed BP monitoring and parameters / know when to call MD - patient had not yet checked BP today  Reviewed specialists: [place]ia [place] related to CKD3b, Dr Ezzard related to Retacrit  injections monthly, Dr Prescilla, GI [facility] [place] [facility], Endo, Dr Claudene at [facility] - reports history of thyroidectomy related to cancer 2015   Chronic Kidney Disease  Interventions: Reviewed medications with patient and discussed importance of compliance    Advised patient, providing education and rationale, to monitor blood pressure daily and record, calling PCP for findings outside established parameters    Reviewed scheduled/upcoming provider appointments including    Assessed social determinant of health barriers    Last practice recorded BP readings:  BP Readings from Last 3 Encounters:  02/02/24 114/68  02/01/24 119/79  01/07/24 (!) 116/58   Most recent eGFR/CrCl:  Lab Results  Component Value Date   EGFR 49 (L) 02/14/2024    No components found for: CRCL  Patient Self Care Activities:  Attend all scheduled provider appointments Call pharmacy for medication refills 3-7 days in advance of running out of  medications Call provider office for new concerns or questions  Notify RN Care Manager of TOC call rescheduling needs Participate in Transition of Care Program/Attend TOC scheduled calls Take medications as prescribed    Plan:  Telephone follow up appointment with care management team member scheduled for:  04/26/24 in the AM The patient has been provided with contact information for the care management team and has been advised to call with any health related questions or concerns.         Shona Prow RN, CCM [place]  VBCI-Population Health RN Care Manager [phone]

## 2024-04-17 NOTE — Telephone Encounter (Signed)
 Called patient appointment made for tomorrow at 11 AM.

## 2024-04-17 NOTE — Telephone Encounter (Signed)
Hospital follow  

## 2024-04-17 NOTE — Patient Instructions (Signed)
 Visit Information  Thank you for taking time to visit with me today. Please don't hesitate to contact me if I can be of assistance to you before our next scheduled telephone appointment.  Our next appointment is by telephone on 04/26/24 in the AM  Following is a copy of your care plan:   Goals Addressed             This Visit's Progress    VBCI Transitions of Care (TOC) Care Plan       Problems:  Recent Hospitalization for treatment of Multifocal pneumonia, CAP bacterial pneumonia-  Patient completed TOC RN initial call and spoke clearly and appeared to be at ease and denied any respiratory issues other than non-productive cough - states she is using IS every hour while awake and getting it up to 1000 reported feels like I have cinder blocks on my chest and states it's new - reported O2 saturation 90% on room air - TOC RN sent secure chat message to PCP in EPIC to Dr Cox making her aware - [facility] RN asked patient if she felt she needed to seek treatment now and patient said she is comfortable waiting for her scheduled appointment tomorrow with Dr Sherre - Patient agrees to seek treatment if this worsens.   Goal:  Over the next 30 days, the patient will not experience hospital readmission  Interventions:  Transitions of Care: Doctor Visits  - discussed the importance of doctor visits Communication with Dr Sherre re: patient feeling like she has cinder blocks sitting on my chest - Patient discharged with pneumonia - reviewed with patient symptoms associated with pneumonia and when to seek medical treatment   Reviewed proper use of Incentive Spirometer and medications - [place] medication list updated and patient to discuss medications she states she gets from GI with PCP at tomorrow's appointments.  Reviewed BP monitoring and parameters / know when to call MD - patient had not yet checked BP today  Reviewed specialists: Patient [place] Hilaria [place] related to CKD3b, Dr Ezzard related to  Retacrit  injections monthly, Dr Prescilla, GI [facility] [place] at [facility], Endo, Dr Claudene at [facility] - reports history of thyroidectomy related to cancer 2015   Chronic Kidney Disease Interventions: Reviewed medications with patient and discussed importance of compliance    Advised patient, providing education and rationale, to monitor blood pressure daily and record, calling PCP for findings outside established parameters    Reviewed scheduled/upcoming provider appointments including    Assessed social determinant of health barriers    Last practice recorded BP readings:  BP Readings from Last 3 Encounters:  02/02/24 114/68  02/01/24 119/79  01/07/24 (!) 116/58   Most recent eGFR/CrCl:  Lab Results  Component Value Date   EGFR 49 (L) 02/14/2024    No components found for: CRCL  Patient Self Care Activities:  Attend all scheduled provider appointments Call pharmacy for medication refills 3-7 days in advance of running out of medications Call provider office for new concerns or questions  Notify RN Care Manager of [facility] call rescheduling needs Participate in Transition of Care Program/Attend TOC scheduled calls Take medications as prescribed    Plan:  Telephone follow up appointment with care management team member scheduled for:  04/26/24 in the AM The patient has been provided with contact information for the care management team and has been advised to call with any health related questions or concerns.         Patient verbalizes  understanding of instructions and care plan provided today and agrees to view in MyChart. Active MyChart status and patient understanding of how to access instructions and care plan via MyChart confirmed with patient.     Telephone follow up appointment with care management team member scheduled for: 04/26/24 in the AM The patient has been provided with contact information for the care management team and has been advised to call with any health related  questions or concerns.   Please call the care guide team at [phone] if you need to cancel or reschedule your appointment.   Please call the Suicide and Crisis Lifeline: 988 call 911 if you are experiencing a Mental Health or Behavioral Health Crisis or need someone to talk to.  Shona Prow RN, CCM [place]  VBCI-Population Health RN Care Manager [phone]

## 2024-04-18 ENCOUNTER — Ambulatory Visit (INDEPENDENT_AMBULATORY_CARE_PROVIDER_SITE_OTHER): Admitting: Family Medicine

## 2024-04-18 ENCOUNTER — Encounter: Payer: Self-pay | Admitting: Family Medicine

## 2024-04-18 VITALS — BP 104/72 | HR 83 | Temp 98.0°F | Ht 61.5 in | Wt 170.0 lb

## 2024-04-18 DIAGNOSIS — E871 Hypo-osmolality and hyponatremia: Secondary | ICD-10-CM

## 2024-04-18 DIAGNOSIS — J189 Pneumonia, unspecified organism: Secondary | ICD-10-CM

## 2024-04-18 DIAGNOSIS — E876 Hypokalemia: Secondary | ICD-10-CM | POA: Diagnosis not present

## 2024-04-18 DIAGNOSIS — J188 Other pneumonia, unspecified organism: Secondary | ICD-10-CM | POA: Insufficient documentation

## 2024-04-18 DIAGNOSIS — F609 Personality disorder, unspecified: Secondary | ICD-10-CM | POA: Insufficient documentation

## 2024-04-18 DIAGNOSIS — R0789 Other chest pain: Secondary | ICD-10-CM | POA: Diagnosis not present

## 2024-04-18 LAB — COMPREHENSIVE METABOLIC PANEL WITH GFR: EGFR: 47

## 2024-04-18 NOTE — Assessment & Plan Note (Addendum)
 EKG NSR NO [facility].  Orders:   EKG 12-Lead

## 2024-04-18 NOTE — Progress Notes (Signed)
 Subjective:  Patient ID: Molly Parrish, female    DOB: 1965-11-13  Age: 58 y.o. MRN: [id number]  Chief Complaint  Patient presents with   Hospitalization Follow-up    HPI: Discussed the use of AI scribe software for clinical note transcription with the patient, who gave verbal consent to proceed.  History of Present Illness Molly Parrish is a 58 year old female who presents with shortness of breath following a recent hospitalization for pneumonia.  Dyspnea and respiratory symptoms - Shortness of breath persists following recent hospitalization for pneumonia (discharged last Thursday) - Dyspnea worsens with exertion or prolonged talking - No home oxygen prescribed after discharge - Breathing has improved since hospitalization but remains impaired, particularly in the left lung - Dry cough present; treated with Tessalon Perles and over-the-counter Robitussin - Uses a breathing exercise device every hour  Recent hospitalization for pneumonia - Hospitalized at [facility] for pneumonia; initial emergency room stay over 20 hours due to lack of available rooms - Treated with IV Rocephin , doxycycline , and IV fluids - Initial oxygen saturation in the seventies, improved to eighty-nine. - Discharged without supplemental oxygen - White blood cell count elevated at 19,000, decreased to 14,000 before discharge - Negative for influenza, RSV, and COVID-19 - CTA chest negative for pulmonary embolism - Echocardiogram showed no congestive heart failure; ejection fraction 70%  Electrolyte imbalance and fluid retention - Low sodium and potassium identified during hospitalization - Received IV sodium and oral potassium supplementation - Gained 10 pounds during hospital stay, attributed to fluid retention despite adherence to a cardiac diet and no excess salt intake  Chest discomfort - Chest discomfort described as a 'weight on her chest' - Occasional difficulty catching  her breath  Gastrointestinal symptoms - History of nausea; uses Zofran  as needed - Prescribed pantoprazole  40 mg daily for acid reflux, but is already on Prilosec and famotidine . Does not need pantoprazole    Anxiety and psychosocial stressors - Increased anxiety, exacerbated by her Husband's recent diagnosis of Alzheimer's disease and cerebral vascular disease with rapid progression - Anxiety contributes to difficulty catching her breath and increased stress levels - She sees psychiatry.        04/18/2024   12:02 PM 01/07/2024   11:44 AM 05/07/2023   10:44 AM 03/01/2023    3:33 PM 06/15/2022   10:02 AM  Depression screen PHQ 2/9  Decreased Interest 1 2 0 0 0  Down, Depressed, Hopeless 1 0 0 0 0  PHQ - 2 Score 2 2 0 0 0  Altered sleeping 3 1  0 3  Tired, decreased energy 0 3  3 3   Change in appetite 0 0  0 0  Feeling bad or failure about yourself  0 1  0 0  Trouble concentrating 1 2  0 0  Moving slowly or fidgety/restless 1 0   1  Suicidal thoughts 0 0  0 0  PHQ-9 Score 7 9  3 7   Difficult doing work/chores Not difficult at all Not difficult at all  Not difficult at all Somewhat difficult        01/07/2024   11:43 AM  Fall Risk   Falls in the past year? 1  Number falls in past yr: 1  Injury with Fall? 1  Risk for fall due to : Impaired balance/gait;Impaired mobility;History of fall(s)  Follow up Falls evaluation completed      04/18/2024   12:02 PM 01/07/2024   11:44 AM 03/01/2023    3:33 PM  06/15/2022   10:06 AM  GAD 7 : Generalized Anxiety Score  Nervous, Anxious, on Edge 2 1 0 3  Control/stop worrying 3 1 0 3  Worry too much - different things 3 1 0 3  Trouble relaxing 3 3 0 2  Restless 3 2 0 2  Easily annoyed or irritable 1 1 0 0  Afraid - awful might happen 0 0 0 1  Total GAD 7 Score 15 9 0 14  Anxiety Difficulty Not difficult at all Somewhat difficult Not difficult at all Very difficult      Patient Care Team: Sherre Clapper, MD as PCP - General (Family  Medicine) Claudene Elsie JUDITHANN Mickey., MD (Endocrinology) Ernesto Grayson, MD as Referring Physician (Geriatric Medicine) [facility] Maurice Loving, MD as Referring Physician (Psychiatry) Joshua Ozell LABOR, DPM (Inactive) as Referring Physician (Podiatry) Ezzard Valaria LABOR, MD as Consulting Physician (Oncology) Sadiq, Sameea, MD as Referring Physician (Nephrology) Lauro Shona LABOR, RN as Registered Nurse   Review of Systems  All other systems reviewed and are negative.   Current Outpatient Medications on File Prior to Visit  Medication Sig Dispense Refill   famotidine  (PEPCID ) 40 MG tablet Take 40 mg by mouth at bedtime.     folic acid  (FOLVITE ) 800 MCG tablet Take 800 mcg by mouth daily.     omeprazole (PRILOSEC) 40 MG capsule Take 40 mg by mouth daily.     propranolol (INDERAL) 10 MG tablet Take 5 mg by mouth at bedtime.     traMADol  (ULTRAM ) 50 MG tablet TAKE 2 TABLETS BY MOUTH TWICE DAILY 120 tablet 1   Vitamin D , Ergocalciferol , (DRISDOL ) 1.25 MG (50000 UNIT) CAPS capsule Take 1 capsule (50,000 Units total) by mouth 2 (two) times a week. 24 capsule 0   acetaminophen  (TYLENOL ) 325 MG tablet Take by mouth.     ALPRAZolam  (XANAX ) 0.25 MG tablet Take 1 tablet by mouth twice daily as needed for anxiety 60 tablet 3   benzonatate (TESSALON) 100 MG capsule Take 100 mg by mouth 3 (three) times daily as needed.     calcitRIOL (ROCALTROL) 0.5 MCG capsule Take 0.5 mcg by mouth 2 (two) times daily.     calcium carbonate (OSCAL) 1500 (600 Ca) MG TABS tablet Take 3,600 mg by mouth 2 (two) times daily with a meal.     CONSTULOSE 10 GM/15ML solution Take 20 g by mouth 2 (two) times daily.     cyclobenzaprine  (FLEXERIL ) 10 MG tablet Take 1 tablet by mouth three times daily as needed for muscle spasm 90 tablet 0   furosemide  (LASIX ) 40 MG tablet Take 2 tablets by mouth twice daily 360 tablet 2   gabapentin  (NEURONTIN ) 100 MG capsule Take 1 capsule (100 mg total) by mouth 3 (three) times daily. 90  capsule 5   Glucagon  (GVOKE HYPOPEN  2-PACK) 0.5 MG/0.1ML SOAJ If glucose less than 60, use gvoke pen x 1. May repeat if not responding x 1 daily. Call EMS if not improving. 0.2 mL 2   hydrOXYzine  (VISTARIL ) 25 MG capsule TAKE 1 CAPSULE BY MOUTH THREE TIMES DAILY 90 capsule 4   Iron -Vitamins (GERITOL COMPLETE) TABS Take 1 tablet by mouth daily.     lamoTRIgine (LAMICTAL) 200 MG tablet Take 200 mg by mouth at bedtime.     levofloxacin (LEVAQUIN) 500 MG tablet Take 500 mg by mouth daily.     levothyroxine  (SYNTHROID ) 150 MCG tablet Take 1 tablet (150 mcg total) by mouth daily before breakfast. 90 tablet 0  magnesium oxide (MAG-OX) 400 MG tablet Take 400 mg by mouth daily.     midodrine  (PROAMATINE ) 2.5 MG tablet TAKE 1 TABLET BY MOUTH THREE TIMES DAILY WITH MEALS 90 tablet 0   ondansetron  (ZOFRAN ) 4 MG tablet TAKE 1 TABLET BY MOUTH EVERY 8 HOURS AS NEEDED FOR NAUSEA AND VOMITING 60 tablet 0   pantoprazole  (PROTONIX ) 40 MG tablet Take 40 mg by mouth daily.     QUEtiapine (SEROQUEL) 50 MG tablet Take 100 mg by mouth at bedtime.     sertraline (ZOLOFT) 100 MG tablet Take 100 mg by mouth in the morning and at bedtime.     spironolactone  (ALDACTONE ) 50 MG tablet Take 2 tablets by mouth twice daily 120 tablet 3   thiamine 100 MG tablet Take 100 mg by mouth daily.     triamcinolone  (KENALOG ) 0.1 % paste APPLY  SMALL AMOUNT TWICE DAILY AS  DIRECTED  IN  MOUTH  OR  THROAT 5 g 0   zolpidem  (AMBIEN ) 10 MG tablet TAKE 1 TABLET BY MOUTH AT BEDTIME 30 tablet 2   No current facility-administered medications on file prior to visit.   Past Medical History:  Diagnosis Date   Alcohol dependence in remission (HCC) 10/04/2013   Alcoholic cirrhosis of liver (HCC)    Anemia    Anemia of chronic disease 08/08/2016   Arterial hypotension 05/08/2020   Bipolar disorder current episode depressed (HCC)    Brain TIA 02/19/2020   CKD (chronic kidney disease)    Episodic mood disorder 11/07/2013   Generalized anxiety  disorder    Generalized weakness 07/11/2020   GERD (gastroesophageal reflux disease)    H/O bariatric surgery 06/11/2020   Hypocalcemia 12/10/2015   Hypokalemia    Hyponatremia 02/19/2020   Intestinal malabsorption    Iron  deficiency anemia due to chronic blood loss 05/08/2020   Korsakoff syndrome    Lumbar disc disease 10/19/2019   Microscopic hematuria 04/16/2021   Migraine 04/20/2014   Mild recurrent major depression 04/19/2020   Multiple closed fractures of ribs of left side 06/03/2020   Formatting of this note might be different from the original. Added automatically from request for surgery [id number]   Neuropathy 01/31/2020   Obsessive-compulsive disorder 11/07/2013   Other osteoporosis without current pathological fracture    Peripheral edema 11/01/2020   Polypharmacy 06/12/2020   Portal hypertension (HCC)    Post-surgical hypothyroidism 12/10/2015   Primary insomnia 03/26/2021   Severe protein-calorie malnutrition 03/26/2021   Shortness of breath 11/01/2020   Stage 3b chronic kidney disease (HCC) 04/16/2021   Stroke (HCC)     left basal ganglia stroke (hemorrhagic)   Telogen effluvium 03/26/2021   Ulcer of left foot with fat layer exposed (HCC) 10/04/2020   Ulcer of right foot, with fat layer exposed (HCC) 10/10/2020   Vitamin D  deficiency    Past Surgical History:  Procedure Laterality Date   BREAST EXCISIONAL BIOPSY Left    CATARACT EXTRACTION     GASTRIC BYPASS  2005   HEMORRHOID SURGERY     HERNIA REPAIR     metatarsus abductus surgery Left 1990   PARTIAL HYSTERECTOMY  05/1997   BL Ovaries remain. Done for fibroids.   THYROIDECTOMY  03/2013    Family History  Problem Relation Age of Onset   Breast cancer Mother    Osteoporosis Mother    Cancer Mother        Breast, lung, skin.    Breast cancer Maternal Grandmother    Social History  Socioeconomic History   Marital status: Married    Spouse name: Not on file   Number of children: 1   Years of education: Not on file    Highest education level: Associate degree: occupational, Scientist, product/process development, or vocational program  Occupational History   Occupation: IT trainer    Comment: Retired.  Tobacco Use   Smoking status: Never   Smokeless tobacco: Never  Substance and Sexual Activity   Alcohol use: Not Currently    Comment: history of alcoholism. sober since 2014.   Drug use: Never   Sexual activity: Yes  Other Topics Concern   Not on file  Social History Narrative   Right handed   Social Drivers of Health   Financial Resource Strain: Low Risk  (01/07/2024)   Overall Financial Resource Strain (CARDIA)    Difficulty of Paying Living Expenses: Not hard at all  Food Insecurity: No Food Insecurity (04/17/2024)   Hunger Vital Sign    Worried About Running Out of Food in the Last Year: Never true    Ran Out of Food in the Last Year: Never true  Transportation Needs: No Transportation Needs (04/17/2024)   PRAPARE - Administrator, Civil Service (Medical): No    Lack of Transportation (Non-Medical): No  Physical Activity: Inactive (01/07/2024)   Exercise Vital Sign    Days of Exercise per Week: 0 days    Minutes of Exercise per Session: Not on file  Stress: Stress Concern Present (01/07/2024)   [employer] of Occupational Health - Occupational Stress Questionnaire    Feeling of Stress: Very much  Social Connections: Moderately Isolated (01/07/2024)   Social Connection and Isolation Panel    Frequency of Communication with Friends and Family: More than three times a week    Frequency of Social Gatherings with Friends and Family: Once a week    Attends Religious Services: Never    Diplomatic Services operational officer: No    Attends Engineer, structural: Not on file    Marital Status: Married    Objective:  BP 104/72   Pulse 83   Temp 98 F (36.7 C)   Ht 5' 1.5 (1.562 m)   Wt 170 lb (77.1 kg)   SpO2 91%   BMI 31.60 kg/m      04/18/2024   11:26 AM 04/17/2024   12:24 PM 02/02/2024    10:36 AM  BP/Weight  Systolic BP 104  885  Diastolic BP 72  68  Wt. (Lbs) 170 163 152  BMI 31.6 kg/m2 30.3 kg/m2 28.26 kg/m2    Physical Exam Vitals reviewed.  Constitutional:      Appearance: Normal appearance.  Cardiovascular:     Rate and Rhythm: Normal rate and regular rhythm.     Heart sounds: Normal heart sounds.  Pulmonary:     Effort: Pulmonary effort is normal. No respiratory distress.     Comments: Decreased breath sounds in BL bases.  Abdominal:     General: Abdomen is flat. Bowel sounds are normal.     Palpations: Abdomen is soft.     Tenderness: There is no abdominal tenderness.  Neurological:     Mental Status: She is alert and oriented to person, place, and time.  Psychiatric:        Mood and Affect: Mood normal.        Behavior: Behavior normal.         Lab Results  Component Value Date   WBC 11.2 (H) 03/27/2024  HGB 10.8 (L) 03/27/2024   HCT 35.3 (L) 03/27/2024   PLT 343 03/27/2024   GLUCOSE 97 02/14/2024   CHOL 126 01/07/2024   TRIG 87 01/07/2024   HDL 61 01/07/2024   LDLCALC 48 01/07/2024   ALT 33 (H) 02/14/2024   AST 29 02/14/2024   NA 137 02/14/2024   K 3.3 (L) 02/14/2024   CL 98 02/14/2024   CREATININE 1.25 (H) 02/14/2024   BUN 28 (H) 02/14/2024   CO2 23 02/14/2024   TSH 0.119 (L) 01/07/2024    Results for orders placed or performed in visit on 04/18/24  Comprehensive metabolic panel with GFR   Collection Time: 04/18/24 12:00 AM  Result Value Ref Range   EGFR 47.0   .  Assessment & Plan:   Assessment & Plan Atypical chest pain EKG NSR NO ST CHYANGES.  Orders:   EKG 12-Lead  Multifocal pneumonia RECHECK CXR. COMPLETE LEVAQUIN.   Orders:   DG Chest 2 View; Future   CBC with Differential/Platelet   Comprehensive metabolic panel with GFR  Hypokalemia CHECK CMP    Hyponatremia CHECK CMP      Body mass index is 31.6 kg/m.   No orders of the defined types were placed in this encounter.   Orders Placed This  Encounter  Procedures   DG Chest 2 View   CBC with Differential/Platelet   Comprehensive metabolic panel with GFR   EKG [id number]       Follow-up: Return in about 1 week (around 04/25/2024) for chronic follow up.  An After Visit Summary was printed and given to the patient.  Abigail Free, MD [facility] [phone]

## 2024-04-18 NOTE — Assessment & Plan Note (Addendum)
 RECHECK CXR. COMPLETE LEVAQUIN.   Orders:   DG Chest 2 View; Future   CBC with Differential/Platelet   Comprehensive metabolic panel with GFR

## 2024-04-18 NOTE — Assessment & Plan Note (Signed)
CHECK CMP

## 2024-04-24 ENCOUNTER — Encounter: Payer: Self-pay | Admitting: Family Medicine

## 2024-04-24 DIAGNOSIS — M12811 Other specific arthropathies, not elsewhere classified, right shoulder: Secondary | ICD-10-CM | POA: Diagnosis not present

## 2024-04-24 NOTE — Assessment & Plan Note (Addendum)
 Osteoporosis with chronic shoulder pain. Consideration for starting Tymlos to strengthen bones. - Ensure Doctor Claudene has bone density test results. - Dr. Claudene considering starting Tymlos.

## 2024-04-24 NOTE — Assessment & Plan Note (Deleted)
 Continued to sustain.

## 2024-04-24 NOTE — Progress Notes (Unsigned)
 Subjective:  Patient ID: Molly Parrish, female    DOB: 07-Jul-1966  Age: 59 y.o. MRN: [id number]  Chief Complaint  Patient presents with   Medical Management of Chronic Issues    HPI: Discussed the use of AI scribe software for clinical note transcription with the patient, who gave verbal consent to proceed.  History of Present Illness A 57 year old female with liver disease and foot ulcers who presents with ear congestion and foot pain.  Ear congestion and fullness - Congestion and sensation of fullness in both ears, more pronounced in the right ear - No fever - Frequent chills, attributed to thyroid  dysfunction  Foot ulcers and pain - History of foot ulcers, including recent surgical removal of a deep toe due to ulceration - Surgical site attempts to scab but does not remain healed; not currently open or bleeding - Cleans feet with alcohol; currently not using Band-Aids per physician recommendation - Significant foot pain, worsened by ambulation - History of metatarsal abductus with progressive worsening - Pain with every step  Liver disease and associated symptoms - Bowel movements improved with lactulose and Xifaxan (obtained from [place]) - Pruritus managed with hydroxyzine  25 mg three times daily - Portal hypertension managed with propranolol 5 mg at bedtime - Nausea managed with Zofran  - Low magnesium treated with magnesium oxide 400 mg daily - Low calcium treated with calcium carbonate 3600 mg twice daily and calcitriol 0.5 mcg twice daily - Spironolactone  100 mcg twice daily and Lasix  40 mg twice daily for volume management - Pepcid  40 mg at bedtime for gastrointestinal symptoms - Thiamine 100 mg daily, folate 800 mcg daily, and multivitamin with iron  supplementation  Neuropathy and musculoskeletal pain - Neuropathy managed with gabapentin  100 mg three times daily - Muscle relaxation with cyclobenzaprine  10 mg as needed - Significant shoulder pain with  history of clavicle fracture in three places and humerus fracture - Sensation of bones crushing with arm movement - Orthopedic surgeon discussed possible shoulder replacement but expressed concern regarding bone health and immune status  Thyroid  dysfunction and associated symptoms - History of thyroid  dysfunction managed with levothyroxine  150 mcg daily - Symptoms include chills, chest aches, dry skin, and muscle cramps - Vitamin D  50,000 units twice weekly for supplementation - Midodrine  2.5 mg three times daily for low blood pressure - Currently under endocrinology care; recent appointments rescheduled due to pneumonia  Psychiatric and sleep symptoms - Sees psychiatry - Anxiety managed with Xanax  0.25 mg twice daily as needed and Zoloft 100 mg daily - Sleep disturbances managed with Ambien  10 mg at bedtime - Seroquel 50 mg two at bedtime for mood and/or sleep       04/25/2024    9:44 AM 04/18/2024   12:02 PM 01/07/2024   11:44 AM 05/07/2023   10:44 AM 03/01/2023    3:33 PM  Depression screen PHQ 2/9  Decreased Interest 1 1 2  0 0  Down, Depressed, Hopeless 1 1 0 0 0  PHQ - 2 Score 2 2 2  0 0  Altered sleeping 3 3 1   0  Tired, decreased energy 0 0 3  3  Change in appetite 0 0 0  0  Feeling bad or failure about yourself  0 0 1  0  Trouble concentrating 1 1 2   0  Moving slowly or fidgety/restless 1 1 0    Suicidal thoughts 0 0 0  0  PHQ-9 Score 7 7 9  3   Difficult doing work/chores Not difficult  at all Not difficult at all Not difficult at all  Not difficult at all        01/07/2024   11:43 AM  Fall Risk   Falls in the past year? 1  Number falls in past yr: 1  Injury with Fall? 1  Risk for fall due to : Impaired balance/gait;Impaired mobility;History of fall(s)  Follow up Falls evaluation completed    Patient Care Team: Sherre Clapper, MD as PCP - General (Family Medicine) Claudene Elsie JUDITHANN Mickey., MD (Endocrinology) Ernesto Grayson, MD as Referring Physician (Geriatric  Medicine) [facility] Maurice Loving, MD as Referring Physician (Psychiatry) Joshua Ozell LABOR, DPM (Inactive) as Referring Physician (Podiatry) Ezzard Valaria LABOR, MD as Consulting Physician (Oncology) Sadiq, Sameea, MD as Referring Physician (Nephrology)   Review of Systems  Constitutional:  Negative for chills, fatigue and fever.  HENT:  Negative for congestion, ear pain and sore throat.   Respiratory:  Negative for cough and shortness of breath.   Cardiovascular:  Negative for chest pain.  Gastrointestinal:  Positive for nausea. Negative for abdominal pain, constipation, diarrhea and vomiting.  Genitourinary:  Negative for dysuria and urgency.  Musculoskeletal:  Positive for arthralgias. Negative for myalgias.  Skin:  Negative for rash.  Neurological:  Negative for dizziness and headaches.  Psychiatric/Behavioral:  Negative for dysphoric mood. The patient is not nervous/anxious.     Current Outpatient Medications on File Prior to Visit  Medication Sig Dispense Refill   acetaminophen  (TYLENOL ) 325 MG tablet Take by mouth.     ALPRAZolam  (XANAX ) 0.25 MG tablet Take 1 tablet by mouth twice daily as needed for anxiety 60 tablet 3   calcitRIOL (ROCALTROL) 0.5 MCG capsule Take 0.5 mcg by mouth 2 (two) times daily.     calcium carbonate (OSCAL) 1500 (600 Ca) MG TABS tablet Take 3,600 mg by mouth 2 (two) times daily with a meal.     CONSTULOSE 10 GM/15ML solution Take 20 g by mouth 2 (two) times daily.     cyclobenzaprine  (FLEXERIL ) 10 MG tablet Take 1 tablet by mouth three times daily as needed for muscle spasm 90 tablet 0   famotidine  (PEPCID ) 40 MG tablet Take 40 mg by mouth at bedtime.     folic acid  (FOLVITE ) 800 MCG tablet Take 800 mcg by mouth daily.     furosemide  (LASIX ) 40 MG tablet Take 2 tablets by mouth twice daily 360 tablet 2   gabapentin  (NEURONTIN ) 100 MG capsule Take 1 capsule (100 mg total) by mouth 3 (three) times daily. 90 capsule 5   Glucagon  (GVOKE HYPOPEN   2-PACK) 0.5 MG/0.1ML SOAJ If glucose less than 60, use gvoke pen x 1. May repeat if not responding x 1 daily. Call EMS if not improving. 0.2 mL 2   hydrOXYzine  (VISTARIL ) 25 MG capsule TAKE 1 CAPSULE BY MOUTH THREE TIMES DAILY 90 capsule 4   Iron -Vitamins (GERITOL COMPLETE) TABS Take 1 tablet by mouth daily.     lamoTRIgine (LAMICTAL) 200 MG tablet Take 200 mg by mouth at bedtime.     levothyroxine  (SYNTHROID ) 150 MCG tablet Take 1 tablet (150 mcg total) by mouth daily before breakfast. 90 tablet 0   magnesium oxide (MAG-OX) 400 MG tablet Take 400 mg by mouth daily.     midodrine  (PROAMATINE ) 2.5 MG tablet TAKE 1 TABLET BY MOUTH THREE TIMES DAILY WITH MEALS 90 tablet 0   ondansetron  (ZOFRAN ) 4 MG tablet TAKE 1 TABLET BY MOUTH EVERY 8 HOURS AS NEEDED FOR NAUSEA AND VOMITING 60  tablet 0   propranolol (INDERAL) 10 MG tablet Take 5 mg by mouth at bedtime.     QUEtiapine (SEROQUEL) 50 MG tablet Take 100 mg by mouth at bedtime.     sertraline (ZOLOFT) 100 MG tablet Take 100 mg by mouth in the morning and at bedtime.     spironolactone  (ALDACTONE ) 50 MG tablet Take 2 tablets by mouth twice daily 120 tablet 3   thiamine 100 MG tablet Take 100 mg by mouth daily.     traMADol  (ULTRAM ) 50 MG tablet TAKE 2 TABLETS BY MOUTH TWICE DAILY 120 tablet 1   triamcinolone  (KENALOG ) 0.1 % paste APPLY  SMALL AMOUNT TWICE DAILY AS  DIRECTED  IN  MOUTH  OR  THROAT 5 g 0   Vitamin D , Ergocalciferol , (DRISDOL ) 1.25 MG (50000 UNIT) CAPS capsule Take 1 capsule (50,000 Units total) by mouth 2 (two) times a week. 24 capsule 0   zolpidem  (AMBIEN ) 10 MG tablet TAKE 1 TABLET BY MOUTH AT BEDTIME 30 tablet 2   levofloxacin (LEVAQUIN) 500 MG tablet Take 500 mg by mouth daily. (Patient not taking: Reported on 04/25/2024)     No current facility-administered medications on file prior to visit.   Past Medical History:  Diagnosis Date   Alcohol dependence in remission (HCC) 10/04/2013   Alcoholic cirrhosis of liver (HCC)    Anemia     Anemia of chronic disease 08/08/2016   Arterial hypotension 05/08/2020   Bipolar disorder current episode depressed (HCC)    Brain TIA 02/19/2020   CKD (chronic kidney disease)    Episodic mood disorder 11/07/2013   Generalized anxiety disorder    Generalized weakness 07/11/2020   GERD (gastroesophageal reflux disease)    H/O bariatric surgery 06/11/2020   Hematoma of chest wall, left, sequela 08/28/2023   Hypocalcemia 12/10/2015   Hypokalemia    Hyponatremia 02/19/2020   Intestinal malabsorption    Iron  deficiency anemia due to chronic blood loss 05/08/2020   Korsakoff syndrome    Lumbar disc disease 10/19/2019   Microscopic hematuria 04/16/2021   Migraine 04/20/2014   Mild recurrent major depression 04/19/2020   Multiple closed fractures of ribs of left side 06/03/2020   Formatting of this note might be different from the original. Added automatically from request for surgery [id number]   Neuropathy 01/31/2020   Obsessive-compulsive disorder 11/07/2013   Other osteoporosis without current pathological fracture    Peripheral edema 11/01/2020   Polypharmacy 06/12/2020   Portal hypertension (HCC)    Post-surgical hypothyroidism 12/10/2015   Primary insomnia 03/26/2021   Sepsis due to methicillin resistant Staphylococcus aureus (MRSA) (HCC) 09/12/2023   Severe protein-calorie malnutrition 03/26/2021   Shortness of breath 11/01/2020   Stage 3b chronic kidney disease (HCC) 04/16/2021   Stroke (HCC)     left basal ganglia stroke (hemorrhagic)   Telogen effluvium 03/26/2021   Ulcer of left foot with fat layer exposed (HCC) 10/04/2020   Ulcer of right foot, with fat layer exposed (HCC) 10/10/2020   Vitamin D  deficiency    Past Surgical History:  Procedure Laterality Date   BREAST EXCISIONAL BIOPSY Left    CATARACT EXTRACTION     GASTRIC BYPASS  2005   HEMORRHOID SURGERY     HERNIA REPAIR     metatarsus abductus surgery Left 1990   PARTIAL HYSTERECTOMY  05/1997   BL Ovaries  remain. Done for fibroids.   THYROIDECTOMY  03/2013    Family History  Problem Relation Age of Onset   Breast cancer Mother  Osteoporosis Mother    Cancer Mother        Breast, lung, skin.    Breast cancer Maternal Grandmother    Social History   Socioeconomic History   Marital status: Married    Spouse name: Not on file   Number of children: 1   Years of education: Not on file   Highest education level: Associate degree: occupational, Scientist, product/process development, or vocational program  Occupational History   Occupation: IT trainer    Comment: Retired.  Tobacco Use   Smoking status: Never   Smokeless tobacco: Never  Substance and Sexual Activity   Alcohol use: Not Currently    Comment: history of alcoholism. sober since 2014.   Drug use: Never   Sexual activity: Yes  Other Topics Concern   Not on file  Social History Narrative   Right handed   Social Drivers of Health   Financial Resource Strain: Low Risk  (01/07/2024)   Overall Financial Resource Strain (CARDIA)    Difficulty of Paying Living Expenses: Not hard at all  Food Insecurity: No Food Insecurity (04/17/2024)   Hunger Vital Sign    Worried About Running Out of Food in the Last Year: Never true    Ran Out of Food in the Last Year: Never true  Transportation Needs: No Transportation Needs (04/17/2024)   PRAPARE - Administrator, Civil Service (Medical): No    Lack of Transportation (Non-Medical): No  Physical Activity: Inactive (01/07/2024)   Exercise Vital Sign    Days of Exercise per Week: 0 days    Minutes of Exercise per Session: Not on file  Stress: Stress Concern Present (01/07/2024)   [employer] of Occupational Health - Occupational Stress Questionnaire    Feeling of Stress: Very much  Social Connections: Moderately Isolated (01/07/2024)   Social Connection and Isolation Panel    Frequency of Communication with Friends and Family: More than three times a week    Frequency of Social Gatherings with Friends  and Family: Once a week    Attends Religious Services: Never    Database administrator or Organizations: No    Attends Engineer, structural: Not on file    Marital Status: Married    Objective:  BP 118/72   Pulse 85   Temp 98 F (36.7 C)   Resp 17   Ht 5' 1.5 (1.562 m)   Wt 165 lb (74.8 kg)   SpO2 96%   BMI 30.67 kg/m      04/25/2024    9:32 AM 04/18/2024   11:26 AM 04/17/2024   12:24 PM  BP/Weight  Systolic BP 118 104   Diastolic BP 72 72   Wt. (Lbs) 165 170 163  BMI 30.67 kg/m2 31.6 kg/m2 30.3 kg/m2    Physical Exam Vitals reviewed.  Constitutional:      Appearance: Normal appearance.  Neck:     Vascular: No carotid bruit.  Cardiovascular:     Rate and Rhythm: Normal rate and regular rhythm.     Heart sounds: Normal heart sounds.  Pulmonary:     Effort: Pulmonary effort is normal. No respiratory distress.     Breath sounds: Normal breath sounds.  Abdominal:     General: Abdomen is flat. Bowel sounds are normal.     Palpations: Abdomen is soft.     Tenderness: There is no abdominal tenderness.  Neurological:     Mental Status: She is alert and oriented to person, place, and time.  Gait: Gait abnormal (uses cane).  Psychiatric:        Mood and Affect: Mood normal.        Behavior: Behavior normal.                  Lab Results  Component Value Date   WBC 10.7 04/26/2024   HGB 10.9 (L) 04/26/2024   HCT 35.1 04/26/2024   PLT 462 (H) 04/26/2024   GLUCOSE 112 (H) 04/26/2024   CHOL 126 01/07/2024   TRIG 87 01/07/2024   HDL 61 01/07/2024   LDLCALC 48 01/07/2024   ALT 17 04/26/2024   AST 20 04/26/2024   NA 136 04/26/2024   K 3.8 04/26/2024   CL 98 04/26/2024   CREATININE 1.23 (H) 04/26/2024   BUN 29 (H) 04/26/2024   CO2 19 (L) 04/26/2024   TSH 4.470 04/26/2024    Results for orders placed or performed in visit on 04/25/24  CBC with Differential/Platelet   Collection Time: 04/26/24  9:23 AM  Result Value Ref Range   WBC  10.7 3.4 - 10.8 x10E3/uL   RBC 3.96 3.77 - 5.28 x10E6/uL   Hemoglobin 10.9 (L) 11.1 - 15.9 g/dL   Hematocrit 64.8 65.9 - 46.6 %   MCV 89 79 - 97 fL   MCH 27.5 26.6 - 33.0 pg   MCHC 31.1 (L) 31.5 - 35.7 g/dL   RDW 85.4 88.2 - 84.5 %   Platelets 462 (H) 150 - 450 x10E3/uL   Neutrophils 72 Not Estab. %   Lymphs 15 Not Estab. %   Monocytes 9 Not Estab. %   Eos 2 Not Estab. %   Basos 1 Not Estab. %   Neutrophils Absolute 7.7 (H) 1.4 - 7.0 x10E3/uL   Lymphocytes Absolute 1.6 0.7 - 3.1 x10E3/uL   Monocytes Absolute 1.0 (H) 0.1 - 0.9 x10E3/uL   EOS (ABSOLUTE) 0.3 0.0 - 0.4 x10E3/uL   Basophils Absolute 0.1 0.0 - 0.2 x10E3/uL   Immature Granulocytes 1 Not Estab. %   Immature Grans (Abs) 0.1 0.0 - 0.1 x10E3/uL  Comprehensive metabolic panel with GFR   Collection Time: 04/26/24  9:23 AM  Result Value Ref Range   Glucose 112 (H) 70 - 99 mg/dL   BUN 29 (H) 8 - 27 mg/dL   Creatinine, Ser 8.76 (H) 0.57 - 1.00 mg/dL   eGFR 50 (L) >40 fO/fpw/8.26   BUN/Creatinine Ratio 24 12 - 28   Sodium 136 134 - 144 mmol/L   Potassium 3.8 3.5 - 5.2 mmol/L   Chloride 98 96 - 106 mmol/L   CO2 19 (L) 20 - 29 mmol/L   Calcium 8.8 8.7 - 10.3 mg/dL   Total Protein 5.9 (L) 6.0 - 8.5 g/dL   Albumin 3.7 (L) 3.8 - 4.9 g/dL   Globulin, Total 2.2 1.5 - 4.5 g/dL   Bilirubin Total 0.3 0.0 - 1.2 mg/dL   Alkaline Phosphatase 167 (H) 49 - 135 IU/L   AST 20 0 - 40 IU/L   ALT 17 0 - 32 IU/L  T4, free   Collection Time: 04/26/24  9:23 AM  Result Value Ref Range   Free T4 1.37 0.82 - 1.77 ng/dL  TSH   Collection Time: 04/26/24  9:23 AM  Result Value Ref Range   TSH 4.470 0.450 - 4.500 uIU/mL  .  Assessment & Plan:   Assessment & Plan Alcoholic cirrhosis of liver without ascites (HCC) Managing liver condition with Xifaxan and lactulose. Propranolol for portal hypertension. - Continue Xifaxan  and lactulose. - Continue propranolol 5 mg at bedtime. -Management per specialist.   Orders:   CBC with  Differential/Platelet   Comprehensive metabolic panel with GFR  Status post foot surgery Chronic right foot ulcer not healing well post-surgery. Experiences chronic pain and neuropathy. Follow-up with podiatrist scheduled. - Examine feet today. - Continue follow-up with podiatrist.    Metatarsus adductus, bilateral, with history of clubfoot Metatarsus adductus and clubfoot contributing to foot pain and deformities.    Chronic right shoulder pain with history of staph infection Chronic right shoulder pain with history of staph infection. Orthopedic surgeon recommended shoulder replacement but hesitant due to bone health and immune system. Plan to refer to a specialist. - Ask Dr Larnell for a referral to a specialist due to complicated surgery recommended.     Localized osteoporosis without current pathological fracture Osteoporosis with chronic shoulder pain. Consideration for starting Tymlos to strengthen bones. - Ensure Doctor Claudene has bone density test results. - Dr. Claudene considering starting Tymlos.    Acquired hypothyroidism On levothyroxine  150 mcg once a day. Reports chills and dry skin, possibly related to thyroid  function. - Check thyroid  levels. Orders:   T4, free   TSH  Generalized anxiety disorder On multiple medications for anxiety and depression, including Xanax , Seroquel, and Zoloft. Hydroxyzine  may have an anxiolytic effect.    Portal hypertension (HCC) Managing liver condition with Xifaxan and lactulose. Propranolol for portal hypertension. - Continue Xifaxan and lactulose. - Continue propranolol 5 mg at bedtime.    Anemia of chronic disease Anemia contributes to fatigue and exertional dyspnea. She receives Procrit injections monthly. - Continue Procrit injections monthly at the hematologist's office.     Encounter for immunization  Orders:   Flu vaccine, recombinant, trivalent, inj    Body mass index is 30.67 kg/m.   Meds ordered this encounter   Medications   fluticasone  (FLONASE ) 50 MCG/ACT nasal spray    Sig: Place 2 sprays into both nostrils daily.    Dispense:  16 g    Refill:  6    Orders Placed This Encounter  Procedures   Flu vaccine, recombinant, trivalent, inj   CBC with Differential/Platelet   Comprehensive metabolic panel with GFR   T4, free   TSH     I,Marla I Leal-Borjas,acting as a scribe for Abigail Free, MD.,have documented all relevant documentation on the behalf of Abigail Free, MD,as directed by  Abigail Free, MD while in the presence of Abigail Free, MD.   Follow-up: Return in about 3 months (around 07/26/2024) for chronic follow up.  An After Visit Summary was printed and given to the patient.  I attest that I have reviewed this visit and agree with the plan scribed by my staff.   Abigail Free, MD [facility] [phone]

## 2024-04-24 NOTE — Assessment & Plan Note (Addendum)
 Anemia contributes to fatigue and exertional dyspnea. She receives Procrit injections monthly. - Continue Procrit injections monthly at the hematologist's office.

## 2024-04-24 NOTE — Assessment & Plan Note (Addendum)
 Managing liver condition with Xifaxan and lactulose. Propranolol for portal hypertension. - Continue Xifaxan and lactulose. - Continue propranolol 5 mg at bedtime. Orders:   CBC with Differential/Platelet   Comprehensive metabolic panel with GFR

## 2024-04-24 NOTE — Assessment & Plan Note (Addendum)
 On levothyroxine  150 mcg once a day. Reports chills and dry skin, possibly related to thyroid  function. - Check thyroid  levels. Orders:   T4, free   TSH

## 2024-04-24 NOTE — Assessment & Plan Note (Addendum)
 Managing liver condition with Xifaxan and lactulose. Propranolol for portal hypertension. - Continue Xifaxan and lactulose. - Continue propranolol 5 mg at bedtime.

## 2024-04-24 NOTE — Assessment & Plan Note (Deleted)
 The current medical regimen is effective;  continue present plan and medications.  - Continue taking sertraline 100 mg daily.

## 2024-04-24 NOTE — Assessment & Plan Note (Addendum)
 On multiple medications for anxiety and depression, including Xanax , Seroquel, and Zoloft. Hydroxyzine  may have an anxiolytic effect.

## 2024-04-24 NOTE — Assessment & Plan Note (Deleted)
 The current medical regimen is effective;  continue present plan and medications.  - Continue thiamine 100 mg daily.

## 2024-04-25 ENCOUNTER — Inpatient Hospital Stay: Attending: Oncology

## 2024-04-25 ENCOUNTER — Ambulatory Visit: Admitting: Family Medicine

## 2024-04-25 ENCOUNTER — Inpatient Hospital Stay

## 2024-04-25 ENCOUNTER — Other Ambulatory Visit: Payer: Self-pay | Admitting: Oncology

## 2024-04-25 VITALS — BP 118/72 | HR 85 | Temp 98.0°F | Resp 17 | Ht 61.5 in | Wt 165.0 lb

## 2024-04-25 DIAGNOSIS — E039 Hypothyroidism, unspecified: Secondary | ICD-10-CM | POA: Diagnosis not present

## 2024-04-25 DIAGNOSIS — N1832 Chronic kidney disease, stage 3b: Secondary | ICD-10-CM | POA: Diagnosis not present

## 2024-04-25 DIAGNOSIS — F330 Major depressive disorder, recurrent, mild: Secondary | ICD-10-CM

## 2024-04-25 DIAGNOSIS — G8929 Other chronic pain: Secondary | ICD-10-CM

## 2024-04-25 DIAGNOSIS — Z9889 Other specified postprocedural states: Secondary | ICD-10-CM | POA: Diagnosis not present

## 2024-04-25 DIAGNOSIS — Q66222 Congenital metatarsus adductus, left foot: Secondary | ICD-10-CM | POA: Diagnosis not present

## 2024-04-25 DIAGNOSIS — M816 Localized osteoporosis [Lequesne]: Secondary | ICD-10-CM | POA: Diagnosis not present

## 2024-04-25 DIAGNOSIS — Z23 Encounter for immunization: Secondary | ICD-10-CM

## 2024-04-25 DIAGNOSIS — Z79899 Other long term (current) drug therapy: Secondary | ICD-10-CM | POA: Diagnosis not present

## 2024-04-25 DIAGNOSIS — M25511 Pain in right shoulder: Secondary | ICD-10-CM | POA: Diagnosis not present

## 2024-04-25 DIAGNOSIS — F411 Generalized anxiety disorder: Secondary | ICD-10-CM | POA: Diagnosis not present

## 2024-04-25 DIAGNOSIS — D631 Anemia in chronic kidney disease: Secondary | ICD-10-CM | POA: Diagnosis not present

## 2024-04-25 DIAGNOSIS — K7030 Alcoholic cirrhosis of liver without ascites: Secondary | ICD-10-CM | POA: Diagnosis not present

## 2024-04-25 DIAGNOSIS — Q66221 Congenital metatarsus adductus, right foot: Secondary | ICD-10-CM

## 2024-04-25 DIAGNOSIS — F04 Amnestic disorder due to known physiological condition: Secondary | ICD-10-CM

## 2024-04-25 DIAGNOSIS — D638 Anemia in other chronic diseases classified elsewhere: Secondary | ICD-10-CM | POA: Diagnosis not present

## 2024-04-25 DIAGNOSIS — F1021 Alcohol dependence, in remission: Secondary | ICD-10-CM

## 2024-04-25 DIAGNOSIS — D72829 Elevated white blood cell count, unspecified: Secondary | ICD-10-CM | POA: Diagnosis not present

## 2024-04-25 DIAGNOSIS — K766 Portal hypertension: Secondary | ICD-10-CM | POA: Diagnosis not present

## 2024-04-25 LAB — CBC WITH DIFFERENTIAL (CANCER CENTER ONLY)
Abs Immature Granulocytes: 0.11 K/uL — ABNORMAL HIGH (ref 0.00–0.07)
Basophils Absolute: 0.1 K/uL (ref 0.0–0.1)
Basophils Relative: 1 %
Eosinophils Absolute: 0.2 K/uL (ref 0.0–0.5)
Eosinophils Relative: 1 %
HCT: 33.1 % — ABNORMAL LOW (ref 36.0–46.0)
Hemoglobin: 10.6 g/dL — ABNORMAL LOW (ref 12.0–15.0)
Immature Granulocytes: 1 %
Lymphocytes Relative: 12 %
Lymphs Abs: 2.2 K/uL (ref 0.7–4.0)
MCH: 27.5 pg (ref 26.0–34.0)
MCHC: 32 g/dL (ref 30.0–36.0)
MCV: 86 fL (ref 80.0–100.0)
Monocytes Absolute: 1 K/uL (ref 0.1–1.0)
Monocytes Relative: 5 %
Neutro Abs: 15.7 K/uL — ABNORMAL HIGH (ref 1.7–7.7)
Neutrophils Relative %: 80 %
Platelet Count: 475 K/uL — ABNORMAL HIGH (ref 150–400)
RBC: 3.85 MIL/uL — ABNORMAL LOW (ref 3.87–5.11)
RDW: 15.8 % — ABNORMAL HIGH (ref 11.5–15.5)
WBC Count: 19.4 K/uL — ABNORMAL HIGH (ref 4.0–10.5)
nRBC: 0 % (ref 0.0–0.2)

## 2024-04-25 MED ORDER — FLUTICASONE PROPIONATE 50 MCG/ACT NA SUSP: 2.0000 | Freq: Every day | NASAL | 6 refills | Status: AC

## 2024-04-25 NOTE — Patient Instructions (Addendum)
  VISIT SUMMARY: During your visit, we addressed your ear congestion, foot ulcers and pain, liver disease, shoulder pain, thyroid  dysfunction, and anxiety. We also discussed your general health maintenance, including the need for a flu shot and a bone density test.  YOUR PLAN: EAR CONGESTION AND FULLNESS: You have been experiencing congestion and a sensation of fullness in both ears, more pronounced in the right ear. -Monitor your symptoms and report any changes or worsening.  CHRONIC RIGHT FOOT ULCER, POST-SURGICAL, WITH NEUROPATHY AND CHRONIC PAIN: Your chronic right foot ulcer is not healing well post-surgery, and you are experiencing chronic pain and neuropathy. -Continue cleaning your feet with alcohol and avoid using Band-Aids as recommended. -Follow up with your podiatrist as scheduled.  METATARSUS ADDUCTUS, BILATERAL, WITH HISTORY OF CLUBFOOT: Your foot pain and deformities are due to metatarsus adductus and a history of clubfoot. -Continue to manage your foot condition as advised by your healthcare providers.  CHRONIC RIGHT SHOULDER PAIN Ask Dr. Larnell for a referral to a specialist.  OSTEOPOROSIS: You have osteoporosis and chronic shoulder pain. Consider Tymlos to strengthen your bones.  HYPOTHYROIDISM: You are on levothyroxine  for thyroid  dysfunction and have symptoms like chills and dry skin. -We will check your thyroid  levels to ensure your medication is working properly.  BIPOLAR, GENERALIZED ANXIETY DISORDER AND DEPRESSION: You are on multiple medications for anxiety and depression, including Xanax , Seroquel, and Zoloft. -Continue your current medications as prescribed.  GENERAL HEALTH MAINTENANCE: You are due for a flu shot and need to discuss bone health. -Get your flu shot as soon as possible. -Discuss the need for a bone density test with your healthcare provider.                      Contains text generated by [employer].                                  Contains text generated by [employer].

## 2024-04-26 DIAGNOSIS — M19042 Primary osteoarthritis, left hand: Secondary | ICD-10-CM | POA: Diagnosis not present

## 2024-04-26 DIAGNOSIS — K7030 Alcoholic cirrhosis of liver without ascites: Secondary | ICD-10-CM | POA: Diagnosis not present

## 2024-04-26 DIAGNOSIS — E039 Hypothyroidism, unspecified: Secondary | ICD-10-CM | POA: Diagnosis not present

## 2024-04-26 NOTE — Progress Notes (Signed)
   04/26/2024  Patient ID: Molly Parrish, female   DOB: Sep 30, 1965, 58 y.o.   MRN: [id number]  Patient marked as being due for A1c, however there is no current/previous diagnosis that exists. Is Has Rx for glucoagon for hypoglycemia however not related to medications that could cause hypoglycemia. Will notify mgr.   Lang Sieve, PharmD, BCGP Clinical Pharmacist  [phone]

## 2024-04-27 ENCOUNTER — Ambulatory Visit: Payer: Self-pay | Admitting: Family Medicine

## 2024-04-27 LAB — CBC WITH DIFFERENTIAL/PLATELET
Basophils Absolute: 0.1 x10E3/uL (ref 0.0–0.2)
Basos: 1 %
EOS (ABSOLUTE): 0.3 x10E3/uL (ref 0.0–0.4)
Eos: 2 %
Hematocrit: 35.1 % (ref 34.0–46.6)
Hemoglobin: 10.9 g/dL — ABNORMAL LOW (ref 11.1–15.9)
Immature Grans (Abs): 0.1 x10E3/uL (ref 0.0–0.1)
Immature Granulocytes: 1 %
Lymphocytes Absolute: 1.6 x10E3/uL (ref 0.7–3.1)
Lymphs: 15 %
MCH: 27.5 pg (ref 26.6–33.0)
MCHC: 31.1 g/dL — ABNORMAL LOW (ref 31.5–35.7)
MCV: 89 fL (ref 79–97)
Monocytes Absolute: 1 x10E3/uL — ABNORMAL HIGH (ref 0.1–0.9)
Monocytes: 9 %
Neutrophils Absolute: 7.7 x10E3/uL — ABNORMAL HIGH (ref 1.4–7.0)
Neutrophils: 72 %
Platelets: 462 x10E3/uL — ABNORMAL HIGH (ref 150–450)
RBC: 3.96 x10E6/uL (ref 3.77–5.28)
RDW: 14.5 % (ref 11.7–15.4)
WBC: 10.7 x10E3/uL (ref 3.4–10.8)

## 2024-04-27 LAB — COMPREHENSIVE METABOLIC PANEL WITH GFR
ALT: 17 IU/L (ref 0–32)
AST: 20 IU/L (ref 0–40)
Albumin: 3.7 g/dL — ABNORMAL LOW (ref 3.8–4.9)
Alkaline Phosphatase: 167 IU/L — ABNORMAL HIGH (ref 49–135)
BUN/Creatinine Ratio: 24 (ref 12–28)
BUN: 29 mg/dL — ABNORMAL HIGH (ref 8–27)
Bilirubin Total: 0.3 mg/dL (ref 0.0–1.2)
CO2: 19 mmol/L — ABNORMAL LOW (ref 20–29)
Calcium: 8.8 mg/dL (ref 8.7–10.3)
Chloride: 98 mmol/L (ref 96–106)
Creatinine, Ser: 1.23 mg/dL — ABNORMAL HIGH (ref 0.57–1.00)
Globulin, Total: 2.2 g/dL (ref 1.5–4.5)
Glucose: 112 mg/dL — ABNORMAL HIGH (ref 70–99)
Potassium: 3.8 mmol/L (ref 3.5–5.2)
Sodium: 136 mmol/L (ref 134–144)
Total Protein: 5.9 g/dL — ABNORMAL LOW (ref 6.0–8.5)
eGFR: 50 mL/min/1.73 — ABNORMAL LOW (ref >59)

## 2024-04-27 LAB — T4, FREE: Free T4: 1.37 ng/dL (ref 0.82–1.77)

## 2024-04-27 LAB — TSH: TSH: 4.47 u[IU]/mL (ref 0.450–4.500)

## 2024-04-28 DIAGNOSIS — M7742 Metatarsalgia, left foot: Secondary | ICD-10-CM | POA: Diagnosis not present

## 2024-04-28 DIAGNOSIS — G621 Alcoholic polyneuropathy: Secondary | ICD-10-CM | POA: Diagnosis not present

## 2024-04-28 DIAGNOSIS — S98112A Complete traumatic amputation of left great toe, initial encounter: Secondary | ICD-10-CM | POA: Diagnosis not present

## 2024-04-28 DIAGNOSIS — L84 Corns and callosities: Secondary | ICD-10-CM | POA: Diagnosis not present

## 2024-04-28 NOTE — Transitions of Care (Post Inpatient/ED Visit) (Signed)
 Care Management  Transitions of Care Program Transitions of Care Post-discharge week 2  04/28/2024 Name: Aolanis Crispen MRN: [id number] DOB: 27-Jan-1966  Subjective: Shamirah Maness Prisk is a 58 y.o. year old female who is a primary care patient of Cox, Kirsten, MD. The Care Management team was unable to reach the patient by phone to assess and address transitions of care needs.   Plan: No further outreach attempts will be made at this time.  We have been unable to reach the patient.  Shona Prow RN, CCM [place]  [facility]Population [facility] RN Care Manager [phone]

## 2024-04-29 NOTE — Assessment & Plan Note (Signed)
 Chronic right foot ulcer not healing well post-surgery. Experiences chronic pain and neuropathy. Follow-up with podiatrist scheduled. - Examine feet today. - Continue follow-up with podiatrist.

## 2024-04-29 NOTE — Assessment & Plan Note (Signed)
 Metatarsus adductus and clubfoot contributing to foot pain and deformities.

## 2024-04-30 ENCOUNTER — Encounter: Payer: Self-pay | Admitting: Family Medicine

## 2024-05-03 DIAGNOSIS — Z1272 Encounter for screening for malignant neoplasm of vagina: Secondary | ICD-10-CM | POA: Diagnosis not present

## 2024-05-03 DIAGNOSIS — N939 Abnormal uterine and vaginal bleeding, unspecified: Secondary | ICD-10-CM | POA: Diagnosis not present

## 2024-05-03 DIAGNOSIS — N1831 Chronic kidney disease, stage 3a: Secondary | ICD-10-CM | POA: Diagnosis not present

## 2024-05-04 DIAGNOSIS — E871 Hypo-osmolality and hyponatremia: Secondary | ICD-10-CM | POA: Diagnosis not present

## 2024-05-04 DIAGNOSIS — M810 Age-related osteoporosis without current pathological fracture: Secondary | ICD-10-CM | POA: Diagnosis not present

## 2024-05-04 DIAGNOSIS — E890 Postprocedural hypothyroidism: Secondary | ICD-10-CM | POA: Diagnosis not present

## 2024-05-04 DIAGNOSIS — C73 Malignant neoplasm of thyroid gland: Secondary | ICD-10-CM | POA: Diagnosis not present

## 2024-05-06 ENCOUNTER — Other Ambulatory Visit: Payer: Self-pay | Admitting: Family Medicine

## 2024-05-09 ENCOUNTER — Other Ambulatory Visit: Payer: Self-pay | Admitting: Family Medicine

## 2024-05-10 DIAGNOSIS — K7030 Alcoholic cirrhosis of liver without ascites: Secondary | ICD-10-CM | POA: Diagnosis not present

## 2024-05-10 DIAGNOSIS — N1832 Chronic kidney disease, stage 3b: Secondary | ICD-10-CM | POA: Diagnosis not present

## 2024-05-17 DIAGNOSIS — F330 Major depressive disorder, recurrent, mild: Secondary | ICD-10-CM | POA: Diagnosis not present

## 2024-05-17 DIAGNOSIS — F422 Mixed obsessional thoughts and acts: Secondary | ICD-10-CM | POA: Diagnosis not present

## 2024-05-17 DIAGNOSIS — F411 Generalized anxiety disorder: Secondary | ICD-10-CM | POA: Diagnosis not present

## 2024-05-23 ENCOUNTER — Inpatient Hospital Stay: Attending: Oncology

## 2024-05-23 ENCOUNTER — Inpatient Hospital Stay

## 2024-05-23 VITALS — BP 114/82 | HR 93 | Temp 97.9°F | Resp 18 | Ht 61.5 in

## 2024-05-23 DIAGNOSIS — Z79899 Other long term (current) drug therapy: Secondary | ICD-10-CM | POA: Insufficient documentation

## 2024-05-23 DIAGNOSIS — K029 Dental caries, unspecified: Secondary | ICD-10-CM | POA: Insufficient documentation

## 2024-05-23 DIAGNOSIS — S025XXA Fracture of tooth (traumatic), initial encounter for closed fracture: Secondary | ICD-10-CM | POA: Diagnosis not present

## 2024-05-23 DIAGNOSIS — Z8701 Personal history of pneumonia (recurrent): Secondary | ICD-10-CM | POA: Insufficient documentation

## 2024-05-23 DIAGNOSIS — D631 Anemia in chronic kidney disease: Secondary | ICD-10-CM | POA: Insufficient documentation

## 2024-05-23 DIAGNOSIS — N1832 Chronic kidney disease, stage 3b: Secondary | ICD-10-CM | POA: Diagnosis not present

## 2024-05-23 DIAGNOSIS — D72829 Elevated white blood cell count, unspecified: Secondary | ICD-10-CM | POA: Diagnosis not present

## 2024-05-23 LAB — CBC WITH DIFFERENTIAL (CANCER CENTER ONLY)
Abs Immature Granulocytes: 0.08 K/uL — ABNORMAL HIGH (ref 0.00–0.07)
Basophils Absolute: 0.1 K/uL (ref 0.0–0.1)
Basophils Relative: 1 %
Eosinophils Absolute: 0.4 K/uL (ref 0.0–0.5)
Eosinophils Relative: 3 %
HCT: 31.9 % — ABNORMAL LOW (ref 36.0–46.0)
Hemoglobin: 9.9 g/dL — ABNORMAL LOW (ref 12.0–15.0)
Immature Granulocytes: 1 %
Lymphocytes Relative: 15 %
Lymphs Abs: 2 K/uL (ref 0.7–4.0)
MCH: 28.4 pg (ref 26.0–34.0)
MCHC: 31 g/dL (ref 30.0–36.0)
MCV: 91.4 fL (ref 80.0–100.0)
Monocytes Absolute: 0.9 K/uL (ref 0.1–1.0)
Monocytes Relative: 7 %
Neutro Abs: 10 K/uL — ABNORMAL HIGH (ref 1.7–7.7)
Neutrophils Relative %: 73 %
Platelet Count: 329 K/uL (ref 150–400)
RBC: 3.49 MIL/uL — ABNORMAL LOW (ref 3.87–5.11)
RDW: 16.2 % — ABNORMAL HIGH (ref 11.5–15.5)
WBC Count: 13.5 K/uL — ABNORMAL HIGH (ref 4.0–10.5)
nRBC: 0 % (ref 0.0–0.2)

## 2024-05-23 MED ORDER — EPOETIN ALFA-EPBX 40000 UNIT/ML IJ SOLN
40000.0000 [IU] | Freq: Once | INTRAMUSCULAR | Status: AC
  Administered 2024-05-23: 40000 [IU] via SUBCUTANEOUS
  Filled 2024-05-23: qty 1

## 2024-05-23 NOTE — Patient Instructions (Signed)

## 2024-05-29 ENCOUNTER — Other Ambulatory Visit: Payer: Self-pay

## 2024-06-05 NOTE — Progress Notes (Unsigned)
 [facility]  [address] [place],  [place]  [address] [phone]  Clinic Day:  02/01/2024  Referring physician: Sherre Clapper, MD  HISTORY OF PRESENT ILLNESS:  The patient is a 58 y.o. female with a history of both leukocytosis and anemia.  A past bone marrow biopsy did not show any obvious marrow etiology behind her abnormal counts.  She comes in today to reassess her anemia as she is receiving monthly Retacrit  injections over these past few months.  Since her last visit, the patient has been doing okay.  She has been battling a urinary tract infection recently, for which she was given antibiotics.  However, she claims to have some back pain to where she is becoming more concerned about her urinary tract infection potentially worsening into pyelonephritis.  As she is seeing her primary care provider tomorrow, she is not interested in going to either urgent care or the emergency room for further evaluation of her UTI.  PHYSICAL EXAM:  There were no vitals taken for this visit. Wt Readings from Last 3 Encounters:  04/25/24 165 lb (74.8 kg)  04/18/24 170 lb (77.1 kg)  04/17/24 163 lb (73.9 kg)   There is no height or weight on file to calculate BMI. Performance status (ECOG): 1 Physical Exam Constitutional:      Appearance: Normal appearance. She is ill-appearing (chronically ill appearance).     Comments: She ambulates with a cane.  She looks physically better today versus previous visits  HENT:     Mouth/Throat:     Mouth: Mucous membranes are moist.     Pharynx: Oropharynx is clear. No oropharyngeal exudate or posterior oropharyngeal erythema.  Cardiovascular:     Rate and Rhythm: Normal rate and regular rhythm.     Heart sounds: No murmur heard.    No friction rub. No gallop.  Pulmonary:     Effort: Pulmonary effort is normal. No respiratory distress.     Breath sounds: No wheezing, rhonchi or rales.  Abdominal:     General: Bowel sounds  are normal. There is no distension.     Palpations: Abdomen is soft. There is no mass.     Tenderness: There is no abdominal tenderness.  Musculoskeletal:        General: No swelling.     Right lower leg: No edema.     Left lower leg: No edema.  Lymphadenopathy:     Cervical: No cervical adenopathy.     Upper Body:     Right upper body: No supraclavicular or axillary adenopathy.     Left upper body: No supraclavicular or axillary adenopathy.     Lower Body: No right inguinal adenopathy. No left inguinal adenopathy.  Skin:    General: Skin is warm.     Coloration: Skin is not jaundiced.     Findings: No lesion or rash.  Neurological:     General: No focal deficit present.     Mental Status: She is alert and oriented to person, place, and time. Mental status is at baseline.  Psychiatric:        Mood and Affect: Mood normal.        Behavior: Behavior normal.        Thought Content: Thought content normal.    LABS:    Latest Reference Range & Units 02/01/24 14:07  WBC 4.0 - 10.5 K/uL 11.0 (H)  RBC 3.87 - 5.11 MIL/uL 3.73 (L)  Hemoglobin 12.0 - 15.0 g/dL 89.1 (L)  HCT 36.0 - 46.0 % 34.9 (L)  MCV 80.0 - 100.0 fL 93.6  MCH 26.0 - 34.0 pg 29.0  MCHC 30.0 - 36.0 g/dL 69.0  RDW 88.4 - 84.4 % 15.9 (H)  Platelets 150 - 400 K/uL 334  nRBC 0.0 - 0.2 % 0.0  Neutrophils % 66  Lymphocytes % 20  Monocytes Relative % 7  Eosinophil % 5  Basophil % 1  Immature Granulocytes % 1  (H): Data is abnormally high (L): Data is abnormally low  Latest Reference Range & Units 02/01/24 14:07  Sodium 135 - 145 mmol/L 138  Potassium 3.5 - 5.1 mmol/L 3.6  Chloride 98 - 111 mmol/L 98  CO2 22 - 32 mmol/L 26  Glucose 70 - 99 mg/dL 883 (H)  BUN 6 - 20 mg/dL 25 (H)  Creatinine 9.55 - 1.00 mg/dL 8.59 (H)  Calcium 8.9 - 10.3 mg/dL 89.3 (H)  Anion gap 5 - 15  14  Alkaline Phosphatase 38 - 126 U/L 155 (H)  Albumin 3.5 - 5.0 g/dL 3.5  AST 15 - 41 U/L 28  ALT 0 - 44 U/L 26  Total Protein 6.5 - 8.1 g/dL  6.6  Total Bilirubin 0.0 - 1.2 mg/dL 0.3  GFR, Est Non African American >60 mL/min 43 (L)  (H): Data is abnormally high (L): Data is abnormally low   Latest Reference Range & Units 02/01/24 14:06  Iron  28 - 170 ug/dL 75  UIBC ug/dL 759  TIBC 749 - 549 ug/dL 684  Saturation Ratios 10.4 - 31.8 % 24  Ferritin 11 - 307 ng/mL 328 (H)  (H): Data is abnormally high  ASSESSMENT & PLAN:  Assessment/Plan:  A 58 y.o. female with both leukocytosis and anemia secondary to chronic renal insufficiency.  I am pleased as her hemoglobin is above 10 today.  This likely represents the efficacy of her monthly Retacrit  injections.  As she has a hemoglobin above 10 today, her monthly Retacrit  injection will be held today.  Although still elevated, her white count today is much lower than what it has been in the recent past.  She did have a white count of 18.4 3+ weeks ago while at her primary care office.  Since then, she has been receiving antibiotics for her urinary tract infection, which is likely why her white count has improved.  As she has back pain that she thinks is related to her urinary tract infection, she knows to speak with her primary care physician about this issue tomorrow.  Otherwise, her CBC will continue to be followed at our clinic monthly to ensure it remains at/above 10.  I will see her back in 4 months for repeat clinical assessment.  The patient understands all the plans discussed today and is in agreement with them.    Terrea Bruster DELENA Kerns, MD

## 2024-06-06 ENCOUNTER — Inpatient Hospital Stay: Admitting: Oncology

## 2024-06-06 ENCOUNTER — Inpatient Hospital Stay

## 2024-06-06 ENCOUNTER — Telehealth: Payer: Self-pay | Admitting: Oncology

## 2024-06-06 ENCOUNTER — Other Ambulatory Visit: Payer: Self-pay | Admitting: Family Medicine

## 2024-06-06 ENCOUNTER — Other Ambulatory Visit: Payer: Self-pay | Admitting: Oncology

## 2024-06-06 ENCOUNTER — Encounter: Payer: Self-pay | Admitting: Family Medicine

## 2024-06-06 ENCOUNTER — Other Ambulatory Visit: Payer: Self-pay

## 2024-06-06 VITALS — BP 114/77 | HR 87 | Temp 97.9°F | Resp 16 | Ht 61.5 in | Wt 173.2 lb

## 2024-06-06 DIAGNOSIS — D72829 Elevated white blood cell count, unspecified: Secondary | ICD-10-CM | POA: Diagnosis not present

## 2024-06-06 DIAGNOSIS — D631 Anemia in chronic kidney disease: Secondary | ICD-10-CM | POA: Diagnosis not present

## 2024-06-06 DIAGNOSIS — D638 Anemia in other chronic diseases classified elsewhere: Secondary | ICD-10-CM

## 2024-06-06 DIAGNOSIS — S025XXA Fracture of tooth (traumatic), initial encounter for closed fracture: Secondary | ICD-10-CM | POA: Diagnosis not present

## 2024-06-06 DIAGNOSIS — K029 Dental caries, unspecified: Secondary | ICD-10-CM | POA: Diagnosis not present

## 2024-06-06 DIAGNOSIS — N189 Chronic kidney disease, unspecified: Secondary | ICD-10-CM | POA: Diagnosis not present

## 2024-06-06 DIAGNOSIS — N1832 Chronic kidney disease, stage 3b: Secondary | ICD-10-CM | POA: Diagnosis not present

## 2024-06-06 DIAGNOSIS — Z8701 Personal history of pneumonia (recurrent): Secondary | ICD-10-CM | POA: Diagnosis not present

## 2024-06-06 DIAGNOSIS — D508 Other iron deficiency anemias: Secondary | ICD-10-CM

## 2024-06-06 DIAGNOSIS — Z79899 Other long term (current) drug therapy: Secondary | ICD-10-CM | POA: Diagnosis not present

## 2024-06-06 LAB — IRON AND TIBC
Iron: 59 ug/dL (ref 28–170)
Saturation Ratios: 25 % (ref 10.4–31.8)
TIBC: 234 ug/dL — ABNORMAL LOW (ref 250–450)
UIBC: 175 ug/dL

## 2024-06-06 LAB — CMP (CANCER CENTER ONLY)
ALT: 23 U/L (ref 0–44)
AST: 28 U/L (ref 15–41)
Albumin: 3 g/dL — ABNORMAL LOW (ref 3.5–5.0)
Alkaline Phosphatase: 157 U/L — ABNORMAL HIGH (ref 38–126)
Anion gap: 10 (ref 5–15)
BUN: 29 mg/dL — ABNORMAL HIGH (ref 6–20)
CO2: 26 mmol/L (ref 22–32)
Calcium: 10 mg/dL (ref 8.9–10.3)
Chloride: 101 mmol/L (ref 98–111)
Creatinine: 1.35 mg/dL — ABNORMAL HIGH (ref 0.44–1.00)
GFR, Estimated: 45 mL/min — ABNORMAL LOW (ref >60)
Glucose, Bld: 105 mg/dL — ABNORMAL HIGH (ref 70–99)
Potassium: 3.7 mmol/L (ref 3.5–5.1)
Sodium: 136 mmol/L (ref 135–145)
Total Bilirubin: 0.4 mg/dL (ref 0.0–1.2)
Total Protein: 5.4 g/dL — ABNORMAL LOW (ref 6.5–8.1)

## 2024-06-06 LAB — CBC WITH DIFFERENTIAL (CANCER CENTER ONLY)
Abs Immature Granulocytes: 0.08 K/uL — ABNORMAL HIGH (ref 0.00–0.07)
Basophils Absolute: 0.1 K/uL (ref 0.0–0.1)
Basophils Relative: 1 %
Eosinophils Absolute: 0.4 K/uL (ref 0.0–0.5)
Eosinophils Relative: 3 %
HCT: 33 % — ABNORMAL LOW (ref 36.0–46.0)
Hemoglobin: 10 g/dL — ABNORMAL LOW (ref 12.0–15.0)
Immature Granulocytes: 1 %
Lymphocytes Relative: 14 %
Lymphs Abs: 1.9 K/uL (ref 0.7–4.0)
MCH: 28.2 pg (ref 26.0–34.0)
MCHC: 30.3 g/dL (ref 30.0–36.0)
MCV: 93.2 fL (ref 80.0–100.0)
Monocytes Absolute: 0.7 K/uL (ref 0.1–1.0)
Monocytes Relative: 5 %
Neutro Abs: 10.4 K/uL — ABNORMAL HIGH (ref 1.7–7.7)
Neutrophils Relative %: 76 %
Platelet Count: 329 K/uL (ref 150–400)
RBC: 3.54 MIL/uL — ABNORMAL LOW (ref 3.87–5.11)
RDW: 15.4 % (ref 11.5–15.5)
WBC Count: 13.6 K/uL — ABNORMAL HIGH (ref 4.0–10.5)
nRBC: 0 % (ref 0.0–0.2)

## 2024-06-06 LAB — FOLATE: Folate: >20 ng/mL (ref >5.9)

## 2024-06-06 LAB — FERRITIN: Ferritin: 720 ng/mL — ABNORMAL HIGH (ref 11–307)

## 2024-06-06 LAB — VITAMIN B12: Vitamin B-12: 1007 pg/mL — ABNORMAL HIGH (ref 180–914)

## 2024-06-06 MED ORDER — ZOLPIDEM TARTRATE 10 MG PO TABS: 10.0000 mg | ORAL_TABLET | Freq: Every day | ORAL | 2 refills | Status: AC

## 2024-06-06 NOTE — Telephone Encounter (Signed)
 Copied from CRM #[id number]. Topic: Clinical - Medication Refill >> Jun 06, 2024 10:41 AM Wess RAMAN wrote: Medication: zolpidem  (AMBIEN ) 10 MG tablet   Has the patient contacted their pharmacy? Yes (Agent: If no, request that the patient contact the pharmacy for the refill. If patient does not wish to contact the pharmacy document the reason why and proceed with request.) (Agent: If yes, when and what did the pharmacy advise?)  This is the patient's preferred pharmacy:  [facility] [address], [place] - [address] [place] [address] Phone: [phone] Fax: [phone]  Is this the correct pharmacy for this prescription? Yes If no, delete pharmacy and type the correct one.   Has the prescription been filled recently? Yes  Is the patient out of the medication? Yes  Has the patient been seen for an appointment in the last year OR does the patient have an upcoming appointment? Yes  Can we respond through MyChart? Yes  Agent: Please be advised that Rx refills may take up to 3 business days. We ask that you follow-up with your pharmacy.

## 2024-06-06 NOTE — Telephone Encounter (Signed)
 Patient has been scheduled for follow-up visit per 06/05/24 LOS.  Pt aware of scheduled appt details.

## 2024-06-07 ENCOUNTER — Other Ambulatory Visit: Payer: Self-pay | Admitting: Family Medicine

## 2024-06-18 ENCOUNTER — Other Ambulatory Visit: Payer: Self-pay | Admitting: Family Medicine

## 2024-06-21 ENCOUNTER — Inpatient Hospital Stay

## 2024-06-21 ENCOUNTER — Inpatient Hospital Stay: Attending: Oncology

## 2024-06-21 DIAGNOSIS — D631 Anemia in chronic kidney disease: Secondary | ICD-10-CM | POA: Diagnosis present

## 2024-06-21 DIAGNOSIS — D72829 Elevated white blood cell count, unspecified: Secondary | ICD-10-CM | POA: Insufficient documentation

## 2024-06-21 DIAGNOSIS — Z79899 Other long term (current) drug therapy: Secondary | ICD-10-CM | POA: Insufficient documentation

## 2024-06-21 DIAGNOSIS — N1831 Chronic kidney disease, stage 3a: Secondary | ICD-10-CM | POA: Insufficient documentation

## 2024-06-21 LAB — CBC WITH DIFFERENTIAL (CANCER CENTER ONLY)
Abs Immature Granulocytes: 0.06 K/uL (ref 0.00–0.07)
Basophils Absolute: 0.1 K/uL (ref 0.0–0.1)
Basophils Relative: 1 %
Eosinophils Absolute: 0.3 K/uL (ref 0.0–0.5)
Eosinophils Relative: 3 %
HCT: 32.6 % — ABNORMAL LOW (ref 36.0–46.0)
Hemoglobin: 10.3 g/dL — ABNORMAL LOW (ref 12.0–15.0)
Immature Granulocytes: 1 %
Lymphocytes Relative: 15 %
Lymphs Abs: 1.5 K/uL (ref 0.7–4.0)
MCH: 28.6 pg (ref 26.0–34.0)
MCHC: 31.6 g/dL (ref 30.0–36.0)
MCV: 90.6 fL (ref 80.0–100.0)
Monocytes Absolute: 0.7 K/uL (ref 0.1–1.0)
Monocytes Relative: 7 %
Neutro Abs: 7.6 K/uL (ref 1.7–7.7)
Neutrophils Relative %: 73 %
Platelet Count: 292 K/uL (ref 150–400)
RBC: 3.6 MIL/uL — ABNORMAL LOW (ref 3.87–5.11)
RDW: 15 % (ref 11.5–15.5)
WBC Count: 10.2 K/uL (ref 4.0–10.5)
nRBC: 0 % (ref 0.0–0.2)

## 2024-06-21 NOTE — Progress Notes (Signed)
 Hgb 10.3 No shot today

## 2024-06-30 DIAGNOSIS — Z780 Asymptomatic menopausal state: Secondary | ICD-10-CM | POA: Diagnosis not present

## 2024-06-30 DIAGNOSIS — M810 Age-related osteoporosis without current pathological fracture: Secondary | ICD-10-CM | POA: Diagnosis not present

## 2024-06-30 DIAGNOSIS — Z1231 Encounter for screening mammogram for malignant neoplasm of breast: Secondary | ICD-10-CM | POA: Diagnosis not present

## 2024-06-30 DIAGNOSIS — Z13820 Encounter for screening for osteoporosis: Secondary | ICD-10-CM | POA: Diagnosis not present

## 2024-06-30 LAB — HM MAMMOGRAPHY

## 2024-06-30 LAB — HM DEXA SCAN

## 2024-07-03 ENCOUNTER — Ambulatory Visit: Payer: Self-pay | Admitting: Family Medicine

## 2024-07-04 ENCOUNTER — Encounter: Payer: Self-pay | Admitting: Family Medicine

## 2024-07-06 ENCOUNTER — Other Ambulatory Visit: Payer: Self-pay | Admitting: Family Medicine

## 2024-07-06 DIAGNOSIS — R110 Nausea: Secondary | ICD-10-CM

## 2024-07-10 ENCOUNTER — Encounter: Payer: Self-pay | Admitting: Family Medicine

## 2024-07-11 DIAGNOSIS — E890 Postprocedural hypothyroidism: Secondary | ICD-10-CM | POA: Diagnosis not present

## 2024-07-11 DIAGNOSIS — E559 Vitamin D deficiency, unspecified: Secondary | ICD-10-CM | POA: Diagnosis not present

## 2024-07-11 DIAGNOSIS — E892 Postprocedural hypoparathyroidism: Secondary | ICD-10-CM | POA: Diagnosis not present

## 2024-07-11 DIAGNOSIS — C73 Malignant neoplasm of thyroid gland: Secondary | ICD-10-CM | POA: Diagnosis not present

## 2024-07-11 DIAGNOSIS — E162 Hypoglycemia, unspecified: Secondary | ICD-10-CM | POA: Diagnosis not present

## 2024-07-13 ENCOUNTER — Ambulatory Visit: Payer: Self-pay | Admitting: Family Medicine

## 2024-07-17 ENCOUNTER — Other Ambulatory Visit: Payer: Self-pay | Admitting: Family Medicine

## 2024-07-18 ENCOUNTER — Other Ambulatory Visit (HOSPITAL_COMMUNITY): Payer: Self-pay | Admitting: Family Medicine

## 2024-07-19 ENCOUNTER — Inpatient Hospital Stay

## 2024-07-19 NOTE — Progress Notes (Signed)
 Pt did not meet parameters for shot today.

## 2024-07-23 NOTE — Progress Notes (Signed)
 Order placed. Dr. Sherre

## 2024-07-28 ENCOUNTER — Ambulatory Visit (HOSPITAL_BASED_OUTPATIENT_CLINIC_OR_DEPARTMENT_OTHER)
Admission: RE | Admit: 2024-07-28 | Discharge: 2024-07-28 | Disposition: A | Source: Ambulatory Visit | Attending: Family Medicine | Admitting: Family Medicine

## 2024-07-28 ENCOUNTER — Ambulatory Visit: Admitting: Family Medicine

## 2024-07-28 ENCOUNTER — Ambulatory Visit: Payer: Self-pay | Admitting: Family Medicine

## 2024-07-28 VITALS — BP 136/82 | HR 103 | Temp 98.0°F | Ht 61.5 in | Wt 161.0 lb

## 2024-07-28 DIAGNOSIS — R0602 Shortness of breath: Secondary | ICD-10-CM | POA: Diagnosis not present

## 2024-07-28 DIAGNOSIS — K766 Portal hypertension: Secondary | ICD-10-CM

## 2024-07-28 DIAGNOSIS — K7030 Alcoholic cirrhosis of liver without ascites: Secondary | ICD-10-CM

## 2024-07-28 DIAGNOSIS — Z23 Encounter for immunization: Secondary | ICD-10-CM

## 2024-07-28 DIAGNOSIS — D638 Anemia in other chronic diseases classified elsewhere: Secondary | ICD-10-CM

## 2024-07-28 DIAGNOSIS — F411 Generalized anxiety disorder: Secondary | ICD-10-CM | POA: Diagnosis not present

## 2024-07-28 DIAGNOSIS — J189 Pneumonia, unspecified organism: Secondary | ICD-10-CM | POA: Diagnosis not present

## 2024-07-28 DIAGNOSIS — E039 Hypothyroidism, unspecified: Secondary | ICD-10-CM | POA: Diagnosis not present

## 2024-07-28 DIAGNOSIS — M816 Localized osteoporosis [Lequesne]: Secondary | ICD-10-CM

## 2024-07-28 DIAGNOSIS — H6121 Impacted cerumen, right ear: Secondary | ICD-10-CM

## 2024-07-28 DIAGNOSIS — R911 Solitary pulmonary nodule: Secondary | ICD-10-CM

## 2024-07-28 DIAGNOSIS — J984 Other disorders of lung: Secondary | ICD-10-CM

## 2024-07-28 LAB — CBC WITH DIFFERENTIAL/PLATELET
Basophils Absolute: 0.1 x10E3/uL (ref 0.0–0.2)
Basos: 1 %
EOS (ABSOLUTE): 0.3 x10E3/uL (ref 0.0–0.4)
Eos: 2 %
Hematocrit: 32.8 % — ABNORMAL LOW (ref 34.0–46.6)
Hemoglobin: 10.8 g/dL — ABNORMAL LOW (ref 11.1–15.9)
Immature Grans (Abs): 0 x10E3/uL (ref 0.0–0.1)
Immature Granulocytes: 0 %
Lymphocytes Absolute: 1.7 x10E3/uL (ref 0.7–3.1)
Lymphs: 10 %
MCH: 33.3 pg — ABNORMAL HIGH (ref 26.6–33.0)
MCHC: 32.9 g/dL (ref 31.5–35.7)
MCV: 101 fL — ABNORMAL HIGH (ref 79–97)
Monocytes Absolute: 1 x10E3/uL — ABNORMAL HIGH (ref 0.1–0.9)
Monocytes: 6 %
Neutrophils Absolute: 14.3 x10E3/uL — ABNORMAL HIGH (ref 1.4–7.0)
Neutrophils: 81 %
Platelets: 451 x10E3/uL — ABNORMAL HIGH (ref 150–450)
RBC: 3.24 x10E6/uL — ABNORMAL LOW (ref 3.77–5.28)
RDW: 13.3 % (ref 11.7–15.4)
WBC: 17.5 x10E3/uL — ABNORMAL HIGH (ref 3.4–10.8)

## 2024-07-28 LAB — COMPREHENSIVE METABOLIC PANEL WITH GFR
ALT: 19 IU/L (ref 0–32)
AST: 25 IU/L (ref 0–40)
Albumin: 3.4 g/dL — ABNORMAL LOW (ref 3.8–4.9)
Alkaline Phosphatase: 203 IU/L — ABNORMAL HIGH (ref 49–135)
BUN/Creatinine Ratio: 12 (ref 12–28)
BUN: 17 mg/dL (ref 8–27)
Bilirubin Total: 0.4 mg/dL (ref 0.0–1.2)
CO2: 23 mmol/L (ref 20–29)
Calcium: 8.9 mg/dL (ref 8.7–10.3)
Chloride: 94 mmol/L — ABNORMAL LOW (ref 96–106)
Creatinine, Ser: 1.45 mg/dL — ABNORMAL HIGH (ref 0.57–1.00)
Globulin, Total: 2.5 g/dL (ref 1.5–4.5)
Glucose: 116 mg/dL — ABNORMAL HIGH (ref 70–99)
Potassium: 3.4 mmol/L — ABNORMAL LOW (ref 3.5–5.2)
Sodium: 134 mmol/L (ref 134–144)
Total Protein: 5.9 g/dL — ABNORMAL LOW (ref 6.0–8.5)
eGFR: 41 mL/min/1.73 — ABNORMAL LOW

## 2024-07-28 LAB — PRO B NATRIURETIC PEPTIDE: NT-Pro BNP: 748 pg/mL — ABNORMAL HIGH (ref 0–287)

## 2024-07-28 LAB — ALPHA-1-ANTITRYPSIN: A-1 Antitrypsin: 249 mg/dL — ABNORMAL HIGH (ref 101–187)

## 2024-07-28 MED ORDER — IPRATROPIUM-ALBUTEROL 0.5-2.5 (3) MG/3ML IN SOLN
3.0000 mL | Freq: Once | RESPIRATORY_TRACT | Status: AC
  Administered 2024-07-28: 3 mL via RESPIRATORY_TRACT

## 2024-07-28 MED ORDER — BENZONATATE 200 MG PO CAPS: 200.0000 mg | ORAL_CAPSULE | Freq: Three times a day (TID) | ORAL | 0 refills | Status: AC | PRN

## 2024-07-28 MED ORDER — CEFTRIAXONE SODIUM 1 G IJ SOLR
1.0000 g | Freq: Once | INTRAMUSCULAR | Status: AC
  Administered 2024-07-28: 1 g via INTRAMUSCULAR

## 2024-07-28 MED ORDER — PREDNISONE 50 MG PO TABS: 50.0000 mg | ORAL_TABLET | Freq: Every day | ORAL | 0 refills | Status: AC

## 2024-07-28 MED ORDER — TRIAMCINOLONE ACETONIDE 40 MG/ML IJ SUSP
80.0000 mg | Freq: Once | INTRAMUSCULAR | Status: AC
  Administered 2024-07-28: 80 mg via INTRAMUSCULAR

## 2024-07-28 MED ORDER — TRAMADOL HCL 50 MG PO TABS: 100.0000 mg | ORAL_TABLET | Freq: Two times a day (BID) | ORAL | 0 refills | Status: AC | PRN

## 2024-07-28 MED ORDER — DOXYCYCLINE HYCLATE 100 MG PO TABS: 100.0000 mg | ORAL_TABLET | Freq: Two times a day (BID) | ORAL | 0 refills | Status: DC

## 2024-07-28 MED ORDER — ALBUTEROL SULFATE HFA 108 (90 BASE) MCG/ACT IN AERS: 2.0000 | INHALATION_SPRAY | Freq: Four times a day (QID) | RESPIRATORY_TRACT | 0 refills | Status: DC | PRN

## 2024-07-28 MED ORDER — LEVOTHYROXINE SODIUM 150 MCG PO TABS: ORAL_TABLET | ORAL | Status: DC

## 2024-07-28 NOTE — Progress Notes (Signed)
 "  Subjective:  Patient ID: Molly Parrish, female    DOB: December 25, 1965  Age: 59 y.o. MRN: [id number]  Chief Complaint  Patient presents with   Medical Management of Chronic Issues    HPI: Discussed the use of AI scribe software for clinical note transcription with the patient, who gave verbal consent to proceed.  History of Present Illness Molly Parrish is a 59 year old female who presents with persistent respiratory symptoms.  Respiratory symptoms - Persistent wheezing and cough for two weeks, worsening at night - Intermittent fevers, chills, and sweats, but not consistent - Cefdinir  prescribed previously without improvement - Uses cough drops for temporary relief - Uses inhaler twice daily, type unspecified - No history of smoking - Chronic exposure to secondhand smoke  Otolaryngologic symptoms - Right ear pain  Hypoglycemia - Episodes of low blood glucose despite eating four meals daily - Freestyle Libre monitor shows glucose fluctuations - History of gastric bypass surgery, which may affect glucose metabolism  Calcium and thyroid  concerns - History of elevated calcium levels - Endocrinology discontinued calcium carbonate supplementation - Low TSH levels on recent labs - Concerned about current calcium levels and requests recheck       07/28/2024    8:41 AM 06/06/2024   11:00 AM 04/25/2024    9:44 AM 04/18/2024   12:02 PM 01/07/2024   11:44 AM  Depression screen PHQ 2/9  Decreased Interest 0 0 1 1 2   Down, Depressed, Hopeless 0 0 1 1 0  PHQ - 2 Score 0 0 2 2 2   Altered sleeping 3  3 3 1   Tired, decreased energy 2  0 0 3  Change in appetite 2  0 0 0  Feeling bad or failure about yourself  0  0 0 1  Trouble concentrating 3  1 1 2   Moving slowly or fidgety/restless 3  1 1  0  Suicidal thoughts 0  0 0 0  PHQ-9 Score 13  7  7  9    Difficult doing work/chores Not difficult at all  Not difficult at all Not difficult at all Not difficult at all     Data  saved with a previous flowsheet row definition        01/07/2024   11:43 AM  Fall Risk   Falls in the past year? 1  Number falls in past yr: 1  Injury with Fall? 1   Risk for fall due to : Impaired balance/gait;Impaired mobility;History of fall(s)  Follow up Falls evaluation completed     Data saved with a previous flowsheet row definition    Patient Care Team: Sherre Clapper, MD as PCP - General (Family Medicine) Claudene Elsie JUDITHANN Mickey., MD (Endocrinology) Ernesto Grayson, MD as Referring Physician (Geriatric Medicine) [facility] Maurice Loving, MD as Referring Physician (Psychiatry) Joshua Ozell LABOR, DPM (Inactive) as Referring Physician (Podiatry) Ezzard Valaria LABOR, MD as Consulting Physician (Oncology) Sadiq, Sameea, MD as Referring Physician (Nephrology)   Review of Systems  Constitutional:  Positive for fatigue. Negative for chills and fever.  HENT:  Positive for ear pain (right). Negative for congestion and sore throat.   Respiratory:  Positive for cough, shortness of breath and wheezing.   Cardiovascular:  Negative for chest pain.  Gastrointestinal:  Negative for abdominal pain, constipation, diarrhea, nausea and vomiting.  Genitourinary:  Negative for dysuria and urgency.  Musculoskeletal:  Negative for arthralgias and myalgias.  Skin:  Negative for rash.  Neurological:  Negative for dizziness and headaches.  Psychiatric/Behavioral:  Negative for dysphoric mood. The patient is not nervous/anxious.     Medications Ordered Prior to Encounter[1] Past Medical History:  Diagnosis Date   Alcohol dependence in remission (HCC) 10/04/2013   Alcoholic cirrhosis of liver (HCC)    Anemia    Anemia of chronic disease 08/08/2016   Arterial hypotension 05/08/2020   Bipolar disorder current episode depressed (HCC)    Brain TIA 02/19/2020   CKD (chronic kidney disease)    Episodic mood disorder 11/07/2013   Generalized anxiety disorder    Generalized weakness 07/11/2020    GERD (gastroesophageal reflux disease)    H/O bariatric surgery 06/11/2020   Hematoma of chest wall, left, sequela 08/28/2023   Hypocalcemia 12/10/2015   Hypokalemia    Hyponatremia 02/19/2020   Intestinal malabsorption    Iron  deficiency anemia due to chronic blood loss 05/08/2020   Korsakoff syndrome    Lumbar disc disease 10/19/2019   Microscopic hematuria 04/16/2021   Migraine 04/20/2014   Mild recurrent major depression 04/19/2020   Multiple closed fractures of ribs of left side 06/03/2020   Formatting of this note might be different from the original. Added automatically from request for surgery [id number]   Neuropathy 01/31/2020   Obsessive-compulsive disorder 11/07/2013   Other osteoporosis without current pathological fracture    Peripheral edema 11/01/2020   Polypharmacy 06/12/2020   Portal hypertension (HCC)    Post-surgical hypothyroidism 12/10/2015   Primary insomnia 03/26/2021   Sepsis due to methicillin resistant Staphylococcus aureus (MRSA) (HCC) 09/12/2023   Severe protein-calorie malnutrition 03/26/2021   Shortness of breath 11/01/2020   Stage 3b chronic kidney disease (HCC) 04/16/2021   Stroke (HCC)     left basal ganglia stroke (hemorrhagic)   Telogen effluvium 03/26/2021   Ulcer of left foot with fat layer exposed (HCC) 10/04/2020   Ulcer of right foot, with fat layer exposed (HCC) 10/10/2020   Vitamin D  deficiency    Past Surgical History:  Procedure Laterality Date   BREAST EXCISIONAL BIOPSY Left    CATARACT EXTRACTION     GASTRIC BYPASS  2005   HEMORRHOID SURGERY     HERNIA REPAIR     metatarsus abductus surgery Left 1990   PARTIAL HYSTERECTOMY  05/1997   BL Ovaries remain. Done for fibroids.   THYROIDECTOMY  03/2013    Family History  Problem Relation Age of Onset   Breast cancer Mother    Osteoporosis Mother    Cancer Mother        Breast, lung, skin.    Breast cancer Maternal Grandmother    Social History   Socioeconomic History    Marital status: Married    Spouse name: Not on file   Number of children: 1   Years of education: Not on file   Highest education level: Associate degree: occupational, scientist, product/process development, or vocational program  Occupational History   Occupation: IT TRAINER    Comment: Retired.  Tobacco Use   Smoking status: Never   Smokeless tobacco: Never  Substance and Sexual Activity   Alcohol use: Not Currently    Comment: history of alcoholism. sober since 2014.   Drug use: Never   Sexual activity: Yes  Other Topics Concern   Not on file  Social History Narrative   Right handed   Social Drivers of Health   Tobacco Use: Low Risk (07/11/2024)   Received from [facility]   Patient History    Smoking Tobacco Use: Never    Smokeless Tobacco Use: Never    Passive  Exposure: Past  Physicist, Medical Strain: Low Risk (07/27/2024)   Overall Financial Resource Strain (CARDIA)    Difficulty of Paying Living Expenses: Not hard at all  Food Insecurity: No Food Insecurity (07/27/2024)   Epic    Worried About Programme Researcher, Broadcasting/film/video in the Last Year: Never true    Ran Out of Food in the Last Year: Never true  Transportation Needs: No Transportation Needs (07/27/2024)   Epic    Lack of Transportation (Medical): No    Lack of Transportation (Non-Medical): No  Physical Activity: Insufficiently Active (07/27/2024)   Exercise Vital Sign    Days of Exercise per Week: 2 days    Minutes of Exercise per Session: 30 min  Stress: Stress Concern Present (07/27/2024)   [employer] of Occupational Health - Occupational Stress Questionnaire    Feeling of Stress: Very much  Social Connections: Moderately Isolated (07/27/2024)   Social Connection and Isolation Panel    Frequency of Communication with Friends and Family: More than three times a week    Frequency of Social Gatherings with Friends and Family: Once a week    Attends Religious Services: Never    Database Administrator or Organizations: No    Attends Museum/gallery Exhibitions Officer: Not on file    Marital Status: Married  Depression (PHQ2-9): High Risk (07/28/2024)   Depression (PHQ2-9)    PHQ-2 Score: 13  Alcohol Screen: Low Risk (09/07/2023)   Alcohol Screen    Last Alcohol Screening Score (AUDIT): 0  Housing: Low Risk (07/27/2024)   Epic    Unable to Pay for Housing in the Last Year: No    Number of Times Moved in the Last Year: 0    Homeless in the Last Year: No  Utilities: Not At Risk (04/17/2024)   Epic    Threatened with loss of utilities: No  Health Literacy: Adequate Health Literacy (09/07/2023)   B1300 Health Literacy    Frequency of need for help with medical instructions: Never    Objective:  BP 136/82   Pulse (!) 103   Temp 98 F (36.7 C)   Ht 5' 1.5 (1.562 m)   Wt 161 lb (73 kg)   SpO2 93%   BMI 29.93 kg/m      07/28/2024    8:14 AM 06/06/2024   11:07 AM 05/23/2024   10:59 AM  BP/Weight  Systolic BP 136 114 114  Diastolic BP 82 77 82  Wt. (Lbs) 161 173.2   BMI 29.93 kg/m2 32.2 kg/m2     Physical Exam Vitals reviewed.  Constitutional:      Appearance: Normal appearance.  HENT:     Right Ear: There is impacted cerumen.     Left Ear: Tympanic membrane, ear canal and external ear normal.     Nose: Nose normal.     Mouth/Throat:     Pharynx: Oropharynx is clear. No oropharyngeal exudate or posterior oropharyngeal erythema.  Cardiovascular:     Rate and Rhythm: Normal rate and regular rhythm.     Heart sounds: Normal heart sounds. No murmur heard. Pulmonary:     Effort: Respiratory distress present.     Breath sounds: No rhonchi.     Comments: Poor air exchange Abdominal:     General: Bowel sounds are normal.     Palpations: Abdomen is soft.     Tenderness: There is no abdominal tenderness.  Lymphadenopathy:     Cervical: No cervical adenopathy.  Neurological:  Mental Status: She is alert and oriented to person, place, and time.  Psychiatric:        Mood and Affect: Mood normal.        Behavior:  Behavior normal.         Lab Results  Component Value Date   WBC 17.5 (H) 07/28/2024   HGB 10.8 (L) 07/28/2024   HCT 32.8 (L) 07/28/2024   PLT 451 (H) 07/28/2024   GLUCOSE 116 (H) 07/28/2024   CHOL 126 01/07/2024   TRIG 87 01/07/2024   HDL 61 01/07/2024   LDLCALC 48 01/07/2024   ALT 19 07/28/2024   AST 25 07/28/2024   NA 134 07/28/2024   K 3.4 (L) 07/28/2024   CL 94 (L) 07/28/2024   CREATININE 1.45 (H) 07/28/2024   BUN 17 07/28/2024   CO2 23 07/28/2024   TSH 4.470 04/26/2024    Results for orders placed or performed in visit on 07/28/24  Pro b natriuretic peptide (BNP)   Collection Time: 07/28/24 10:04 AM  Result Value Ref Range   NT-Pro BNP 748 (H) 0 - 287 pg/mL  CBC with Differential/Platelet   Collection Time: 07/28/24 10:04 AM  Result Value Ref Range   WBC 17.5 (H) 3.4 - 10.8 x10E3/uL   RBC 3.24 (L) 3.77 - 5.28 x10E6/uL   Hemoglobin 10.8 (L) 11.1 - 15.9 g/dL   Hematocrit 67.1 (L) 65.9 - 46.6 %   MCV 101 (H) 79 - 97 fL   MCH 33.3 (H) 26.6 - 33.0 pg   MCHC 32.9 31.5 - 35.7 g/dL   RDW 86.6 88.2 - 84.5 %   Platelets 451 (H) 150 - 450 x10E3/uL   Neutrophils 81 Not Estab. %   Lymphs 10 Not Estab. %   Monocytes 6 Not Estab. %   Eos 2 Not Estab. %   Basos 1 Not Estab. %   Neutrophils Absolute 14.3 (H) 1.4 - 7.0 x10E3/uL   Lymphocytes Absolute 1.7 0.7 - 3.1 x10E3/uL   Monocytes Absolute 1.0 (H) 0.1 - 0.9 x10E3/uL   EOS (ABSOLUTE) 0.3 0.0 - 0.4 x10E3/uL   Basophils Absolute 0.1 0.0 - 0.2 x10E3/uL   Immature Granulocytes 0 Not Estab. %   Immature Grans (Abs) 0.0 0.0 - 0.1 x10E3/uL  Comprehensive metabolic panel with GFR   Collection Time: 07/28/24 10:04 AM  Result Value Ref Range   Glucose 116 (H) 70 - 99 mg/dL   BUN 17 8 - 27 mg/dL   Creatinine, Ser 8.54 (H) 0.57 - 1.00 mg/dL   eGFR 41 (L) >40 fO/fpw/8.26   BUN/Creatinine Ratio 12 12 - 28   Sodium 134 134 - 144 mmol/L   Potassium 3.4 (L) 3.5 - 5.2 mmol/L   Chloride 94 (L) 96 - 106 mmol/L   CO2 23 20 - 29  mmol/L   Calcium 8.9 8.7 - 10.3 mg/dL   Total Protein 5.9 (L) 6.0 - 8.5 g/dL   Albumin 3.4 (L) 3.8 - 4.9 g/dL   Globulin, Total 2.5 1.5 - 4.5 g/dL   Bilirubin Total 0.4 0.0 - 1.2 mg/dL   Alkaline Phosphatase 203 (H) 49 - 135 IU/L   AST 25 0 - 40 IU/L   ALT 19 0 - 32 IU/L  .  Assessment & Plan:   Assessment & Plan Pneumonia of both lungs due to infectious organism, unspecified part of lung Persistent cough and wheezing for two weeks, unresponsive to cefdinir . Right ear pain due to wax buildup. - Ordered chest x-ray. Orders:   cefTRIAXone  (ROCEPHIN )  injection 1 g   triamcinolone  acetonide (KENALOG -40) injection 80 mg   predniSONE  (DELTASONE ) 50 MG tablet; Take 1 tablet (50 mg total) by mouth daily with breakfast.   doxycycline  (VIBRA -TABS) 100 MG tablet; Take 1 tablet (100 mg total) by mouth 2 (two) times daily.   benzonatate  (TESSALON ) 200 MG capsule; Take 1 capsule (200 mg total) by mouth 3 (three) times daily as needed for cough.  Alcoholic cirrhosis of liver without ascites (HCC) We will continue with lactulose and explore options for obtaining  Check labs. Orders:   Alpha-1-Antitrypsin   Comprehensive metabolic panel with GFR   Localized osteoporosis without current pathological fracture Ordering evenity.      Acquired hypothyroidism Currently on levothyroxine  150 mcg daily with low TSH levels, possibly due to high calcium. - Continue current levothyroxine  regimen. - Recheck thyroid  function in three weeks. Orders:   levothyroxine  (SYNTHROID ) 150 MCG tablet; Once daily except Sunday takes 1/2 pill daily  Generalized anxiety disorder Managed with Xanax  0.25 mg twice a day as needed. Continues psychiatric management. - Continue current Xanax  regimen.    Portal hypertension (HCC) Continue propranolol. Considering dosage reduction of midodrine  as systemic bp is doing well.      Anemia of chronic disease No recent blood work to assess hemoglobin levels. - Ordered  labs Orders:   CBC with Differential/Platelet  Shortness of breath Persistent cough and wheezing for two weeks, unresponsive to cefdinir . Right ear pain due to wax buildup. - Ordered chest x-ray. Orders:   ipratropium-albuterol  (DUONEB) 0.5-2.5 (3) MG/3ML nebulizer solution 3 mL   DG Chest 2 View; Future   triamcinolone  acetonide (KENALOG -40) injection 80 mg   predniSONE  (DELTASONE ) 50 MG tablet; Take 1 tablet (50 mg total) by mouth daily with breakfast.   albuterol  (VENTOLIN  HFA) 108 (90 Base) MCG/ACT inhaler; Inhale 2 puffs into the lungs every 6 (six) hours as needed for wheezing or shortness of breath.   Pro b natriuretic peptide (BNP)  Hearing loss of right ear due to cerumen impaction Irrigation partially successful. Had to stop due to triggering coughing. Recommended use of debrox at home.  Orders:   Ear Lavage  Encounter for immunization  Orders:   Pfizer Comirnaty Covid-19 Vaccine 17yrs & older   Body mass index is 29.93 kg/m.    Meds ordered this encounter  Medications   levothyroxine  (SYNTHROID ) 150 MCG tablet    Sig: Once daily except Sunday takes 1/2 pill daily   traMADol  (ULTRAM ) 50 MG tablet    Sig: Take 2 tablets (100 mg total) by mouth 2 (two) times daily as needed (back pain).    Dispense:  60 tablet    Refill:  0    Chronic back pain   ipratropium-albuterol  (DUONEB) 0.5-2.5 (3) MG/3ML nebulizer solution 3 mL   cefTRIAXone  (ROCEPHIN ) injection 1 g   triamcinolone  acetonide (KENALOG -40) injection 80 mg   predniSONE  (DELTASONE ) 50 MG tablet    Sig: Take 1 tablet (50 mg total) by mouth daily with breakfast.    Dispense:  5 tablet    Refill:  0   albuterol  (VENTOLIN  HFA) 108 (90 Base) MCG/ACT inhaler    Sig: Inhale 2 puffs into the lungs every 6 (six) hours as needed for wheezing or shortness of breath.    Dispense:  8 g    Refill:  0   doxycycline  (VIBRA -TABS) 100 MG tablet    Sig: Take 1 tablet (100 mg total) by mouth 2 (two) times daily.     Dispense:  20 tablet    Refill:  0   benzonatate  (TESSALON ) 200 MG capsule    Sig: Take 1 capsule (200 mg total) by mouth 3 (three) times daily as needed for cough.    Dispense:  30 capsule    Refill:  0    Orders Placed This Encounter  Procedures   DG Chest 2 View   Pfizer Comirnaty Covid-19 Vaccine 45yrs & older   Alpha-1-Antitrypsin   Pro b natriuretic peptide (BNP)   CBC with Differential/Platelet   Comprehensive metabolic panel with GFR   Ear Lavage     I,Marla I Leal-Borjas,acting as a scribe for Abigail Free, MD.,have documented all relevant documentation on the behalf of Abigail Free, MD,as directed by  Abigail Free, MD while in the presence of Abigail Free, MD.   Follow-up: Return in about 2 weeks (around 08/11/2024) for ear irrigation and dyspnea. , Dina.  An After Visit Summary was printed and given to the patient.  Abigail Free, MD [facility] [phone]     [1]  Current Outpatient Medications on File Prior to Visit  Medication Sig Dispense Refill   budesonide -formoterol  (SYMBICORT ) 160-4.5 MCG/ACT inhaler Inhale 2 puffs into the lungs 2 (two) times daily.     cefdinir  (OMNICEF ) 300 MG capsule Take 300 mg by mouth 2 (two) times daily.     ALPRAZolam  (XANAX ) 0.25 MG tablet Take 1 tablet by mouth twice daily as needed for anxiety 60 tablet 0   calcitRIOL (ROCALTROL) 0.5 MCG capsule Take 0.5 mcg by mouth daily.     CONSTULOSE 10 GM/15ML solution Take 20 g by mouth 2 (two) times daily.     cyclobenzaprine  (FLEXERIL ) 10 MG tablet Take 1 tablet by mouth three times daily as needed for muscle spasm 90 tablet 3   famotidine  (PEPCID ) 40 MG tablet Take 40 mg by mouth at bedtime.     fluticasone  (FLONASE ) 50 MCG/ACT nasal spray Place 2 sprays into both nostrils daily. 16 g 6   folic acid  (FOLVITE ) 800 MCG tablet Take 800 mcg by mouth daily.     furosemide  (LASIX ) 40 MG tablet Take 2 tablets by mouth twice daily 360 tablet 2   gabapentin  (NEURONTIN ) 100 MG capsule  TAKE 1 CAPSULE BY MOUTH THREE TIMES DAILY 90 capsule 3   Glucagon  (GVOKE HYPOPEN  2-PACK) 0.5 MG/0.1ML SOAJ If glucose less than 60, use gvoke pen x 1. May repeat if not responding x 1 daily. Call EMS if not improving. 0.2 mL 2   hydrOXYzine  (VISTARIL ) 25 MG capsule TAKE 1 CAPSULE BY MOUTH THREE TIMES DAILY 90 capsule 0   Iron -Vitamins (GERITOL COMPLETE) TABS Take 1 tablet by mouth daily.     lamoTRIgine (LAMICTAL) 200 MG tablet Take 200 mg by mouth at bedtime.     magnesium oxide (MAG-OX) 400 MG tablet Take 400 mg by mouth daily.     midodrine  (PROAMATINE ) 2.5 MG tablet TAKE 1 TABLET BY MOUTH THREE TIMES DAILY WITH MEALS 90 tablet 0   ondansetron  (ZOFRAN ) 4 MG tablet TAKE 1 TABLET BY MOUTH EVERY 8 HOURS AS NEEDED FOR NAUSEA AND VOMITING 60 tablet 0   propranolol (INDERAL) 10 MG tablet Take 5 mg by mouth at bedtime.     QUEtiapine (SEROQUEL) 50 MG tablet Take 100 mg by mouth at bedtime.     sertraline (ZOLOFT) 100 MG tablet Take 100 mg by mouth in the morning and at bedtime.     sevelamer carbonate (RENVELA) 800 MG tablet Take 800 mg by  mouth.     spironolactone  (ALDACTONE ) 50 MG tablet Take 2 tablets by mouth twice daily 120 tablet 3   thiamine 100 MG tablet Take 100 mg by mouth daily.     triamcinolone  (KENALOG ) 0.1 % paste APPLY SMALL AMOUNT TWICE DAILY AS DIRECTED IN MOUTH OR THROAT 5 g 3   zolpidem  (AMBIEN ) 10 MG tablet Take 1 tablet (10 mg total) by mouth at bedtime. 30 tablet 2   No current facility-administered medications on file prior to visit.   "

## 2024-07-28 NOTE — Patient Instructions (Addendum)
" °  VISIT SUMMARY: Today, you were seen for persistent respiratory symptoms, ear pain, hypoglycemia, and concerns about calcium and thyroid  levels. We discussed your current medications and ordered some tests to better understand your condition.  YOUR PLAN: ACUTE LOWER RESPIRATORY INFECTION: You have had a persistent cough and wheezing for two weeks that has not improved with antibiotics.   Prednisone  50 mg daily x 5 days.  Albuterol  inhaler 2 puffs four times a day for 2 days and then as needed.  Doxycycline  100 mg twice daily  Complete cefdinir .  Benzonatate  200 mg one three times a day as needed for cough.  Continue symbicort  inhaler 2 puffs twice daily.   RIGHT EAR PAIN: You have pain in your right ear due to wax buildup. -We will address the wax buildup to relieve your ear pain. -Use debrox drops as directed otc for one week.   ACQUIRED HYPOTHYROIDISM: Your thyroid  levels are low despite taking levothyroxine , possibly due to high calcium levels. -Continue taking levothyroxine  150 mcg daily. -We will recheck your thyroid  function in three weeks.  PORTAL HYPERTENSION: Your blood pressure is well-controlled with your current medications. -We are considering reducing your midodrine  dosage.  ANEMIA OF CHRONIC DISEASE: We need to assess your hemoglobin levels as there has been no recent blood work. -We have ordered a chemistry panel to check your calcium and blood count.  GENERALIZED ANXIETY DISORDER: Your anxiety is managed with Xanax  as needed. -Continue taking Xanax  0.25 mg twice a day as needed.  We will consider decreasing midodrine  at next visit.                       Contains text generated by [employer].                                 Contains text generated by [employer]bridge.   "

## 2024-07-29 DIAGNOSIS — J189 Pneumonia, unspecified organism: Secondary | ICD-10-CM | POA: Insufficient documentation

## 2024-07-29 DIAGNOSIS — H6121 Impacted cerumen, right ear: Secondary | ICD-10-CM | POA: Insufficient documentation

## 2024-07-29 DIAGNOSIS — R0602 Shortness of breath: Secondary | ICD-10-CM | POA: Insufficient documentation

## 2024-07-29 NOTE — Assessment & Plan Note (Addendum)
 Managed with Xanax  0.25 mg twice a day as needed. Continues psychiatric management. - Continue current Xanax  regimen.

## 2024-07-29 NOTE — Assessment & Plan Note (Addendum)
 Persistent cough and wheezing for two weeks, unresponsive to cefdinir . Right ear pain due to wax buildup. - Ordered chest x-ray. Orders:   cefTRIAXone  (ROCEPHIN ) injection 1 g   triamcinolone  acetonide (KENALOG -40) injection 80 mg   predniSONE  (DELTASONE ) 50 MG tablet; Take 1 tablet (50 mg total) by mouth daily with breakfast.   doxycycline  (VIBRA -TABS) 100 MG tablet; Take 1 tablet (100 mg total) by mouth 2 (two) times daily.   benzonatate  (TESSALON ) 200 MG capsule; Take 1 capsule (200 mg total) by mouth 3 (three) times daily as needed for cough.

## 2024-07-29 NOTE — Assessment & Plan Note (Addendum)
 No recent blood work to assess hemoglobin levels. - Ordered labs Orders:   CBC with Differential/Platelet

## 2024-07-29 NOTE — Assessment & Plan Note (Addendum)
 Continue propranolol. Considering dosage reduction of midodrine  as systemic bp is doing well.

## 2024-07-29 NOTE — Assessment & Plan Note (Addendum)
 Currently on levothyroxine  150 mcg daily with low TSH levels, possibly due to high calcium. - Continue current levothyroxine  regimen. - Recheck thyroid  function in three weeks. Orders:   levothyroxine  (SYNTHROID ) 150 MCG tablet; Once daily except Sunday takes 1/2 pill daily

## 2024-07-29 NOTE — Assessment & Plan Note (Addendum)
 Persistent cough and wheezing for two weeks, unresponsive to cefdinir . Right ear pain due to wax buildup. - Ordered chest x-ray. Orders:   ipratropium-albuterol  (DUONEB) 0.5-2.5 (3) MG/3ML nebulizer solution 3 mL   DG Chest 2 View; Future   triamcinolone  acetonide (KENALOG -40) injection 80 mg   predniSONE  (DELTASONE ) 50 MG tablet; Take 1 tablet (50 mg total) by mouth daily with breakfast.   albuterol  (VENTOLIN  HFA) 108 (90 Base) MCG/ACT inhaler; Inhale 2 puffs into the lungs every 6 (six) hours as needed for wheezing or shortness of breath.   Pro b natriuretic peptide (BNP)

## 2024-07-29 NOTE — Assessment & Plan Note (Addendum)
 Irrigation partially successful. Had to stop due to triggering coughing. Recommended use of debrox at home.  Orders:   Ear Lavage

## 2024-07-29 NOTE — Assessment & Plan Note (Addendum)
 Ordering evenity

## 2024-07-29 NOTE — Assessment & Plan Note (Addendum)
 We will continue with lactulose and explore options for obtaining  Check labs. Orders:   Alpha-1-Antitrypsin   Comprehensive metabolic panel with GFR

## 2024-07-31 ENCOUNTER — Other Ambulatory Visit: Payer: Self-pay

## 2024-07-31 DIAGNOSIS — K7030 Alcoholic cirrhosis of liver without ascites: Secondary | ICD-10-CM

## 2024-08-02 ENCOUNTER — Other Ambulatory Visit

## 2024-08-02 DIAGNOSIS — K7030 Alcoholic cirrhosis of liver without ascites: Secondary | ICD-10-CM

## 2024-08-03 LAB — ALPHA-1-ANTITRYPSIN: A-1 Antitrypsin: 261 mg/dL — ABNORMAL HIGH (ref 101–187)

## 2024-08-04 ENCOUNTER — Encounter: Payer: Self-pay | Admitting: Family Medicine

## 2024-08-06 ENCOUNTER — Encounter: Payer: Self-pay | Admitting: Family Medicine

## 2024-08-07 ENCOUNTER — Other Ambulatory Visit: Payer: Self-pay

## 2024-08-07 ENCOUNTER — Encounter: Payer: Self-pay | Admitting: Family Medicine

## 2024-08-07 ENCOUNTER — Other Ambulatory Visit: Payer: Self-pay | Admitting: Family Medicine

## 2024-08-07 DIAGNOSIS — K140 Glossitis: Secondary | ICD-10-CM

## 2024-08-07 DIAGNOSIS — B370 Candidal stomatitis: Secondary | ICD-10-CM

## 2024-08-07 MED ORDER — NYSTATIN 100000 UNIT/ML MT SUSP: 5.0000 mL | Freq: Four times a day (QID) | OROMUCOSAL | 0 refills | Status: AC

## 2024-08-08 ENCOUNTER — Other Ambulatory Visit: Payer: Self-pay | Admitting: Family Medicine

## 2024-08-09 ENCOUNTER — Other Ambulatory Visit: Payer: Self-pay | Admitting: Family Medicine

## 2024-08-10 ENCOUNTER — Other Ambulatory Visit: Payer: Self-pay | Admitting: Family Medicine

## 2024-08-11 ENCOUNTER — Other Ambulatory Visit: Payer: Self-pay | Admitting: Family Medicine

## 2024-08-11 ENCOUNTER — Encounter: Payer: Self-pay | Admitting: Family Medicine

## 2024-08-11 DIAGNOSIS — E039 Hypothyroidism, unspecified: Secondary | ICD-10-CM

## 2024-08-12 ENCOUNTER — Encounter: Payer: Self-pay | Admitting: Family Medicine

## 2024-08-13 ENCOUNTER — Other Ambulatory Visit: Payer: Self-pay | Admitting: Family Medicine

## 2024-08-13 DIAGNOSIS — E039 Hypothyroidism, unspecified: Secondary | ICD-10-CM

## 2024-08-15 ENCOUNTER — Ambulatory Visit: Admitting: Family Medicine

## 2024-08-16 ENCOUNTER — Inpatient Hospital Stay

## 2024-08-18 ENCOUNTER — Ambulatory Visit (HOSPITAL_BASED_OUTPATIENT_CLINIC_OR_DEPARTMENT_OTHER)

## 2024-08-21 ENCOUNTER — Inpatient Hospital Stay

## 2024-08-21 ENCOUNTER — Encounter: Payer: Self-pay | Admitting: *Deleted

## 2024-08-21 NOTE — Progress Notes (Signed)
 Molly Parrish                                          MRN: [id number]   08/21/2024   The [facility] Quality Team Specialist reviewed this patient medical record for the purposes of chart review for care gap closure. The following were reviewed: chart review for care gap closure-glycemic status assessment.    [facility] Quality Team

## 2024-08-22 ENCOUNTER — Ambulatory Visit: Admitting: Family Medicine

## 2024-08-23 ENCOUNTER — Inpatient Hospital Stay

## 2024-08-23 ENCOUNTER — Other Ambulatory Visit: Payer: Self-pay | Admitting: Pharmacist

## 2024-08-23 ENCOUNTER — Inpatient Hospital Stay: Attending: Oncology

## 2024-08-23 DIAGNOSIS — D638 Anemia in other chronic diseases classified elsewhere: Secondary | ICD-10-CM

## 2024-08-23 LAB — CBC WITH DIFFERENTIAL (CANCER CENTER ONLY)
Abs Immature Granulocytes: 0.11 10*3/uL — ABNORMAL HIGH (ref 0.00–0.07)
Basophils Absolute: 0.1 10*3/uL (ref 0.0–0.1)
Basophils Relative: 1 %
Eosinophils Absolute: 0.5 10*3/uL (ref 0.0–0.5)
Eosinophils Relative: 3 %
HCT: 35.6 % — ABNORMAL LOW (ref 36.0–46.0)
Hemoglobin: 11.5 g/dL — ABNORMAL LOW (ref 12.0–15.0)
Immature Granulocytes: 1 %
Lymphocytes Relative: 9 %
Lymphs Abs: 1.5 10*3/uL (ref 0.7–4.0)
MCH: 29.5 pg (ref 26.0–34.0)
MCHC: 32.3 g/dL (ref 30.0–36.0)
MCV: 91.3 fL (ref 80.0–100.0)
Monocytes Absolute: 0.8 10*3/uL (ref 0.1–1.0)
Monocytes Relative: 5 %
Neutro Abs: 13.4 10*3/uL — ABNORMAL HIGH (ref 1.7–7.7)
Neutrophils Relative %: 81 %
Platelet Count: 331 10*3/uL (ref 150–400)
RBC: 3.9 MIL/uL (ref 3.87–5.11)
RDW: 14.4 % (ref 11.5–15.5)
WBC Count: 16.3 10*3/uL — ABNORMAL HIGH (ref 4.0–10.5)
nRBC: 0 % (ref 0.0–0.2)

## 2024-08-23 NOTE — Progress Notes (Signed)
 No injection needed, Hemoglobin 11.6 today.  Pt pleased with results.

## 2024-08-24 ENCOUNTER — Encounter: Payer: Self-pay | Admitting: Family Medicine

## 2024-08-24 ENCOUNTER — Ambulatory Visit: Admitting: Family Medicine

## 2024-08-24 VITALS — BP 120/70 | HR 94 | Temp 97.8°F | Resp 20 | Ht 61.5 in | Wt 162.4 lb

## 2024-08-24 DIAGNOSIS — N3001 Acute cystitis with hematuria: Secondary | ICD-10-CM

## 2024-08-24 DIAGNOSIS — M816 Localized osteoporosis [Lequesne]: Secondary | ICD-10-CM

## 2024-08-24 DIAGNOSIS — E039 Hypothyroidism, unspecified: Secondary | ICD-10-CM

## 2024-08-24 DIAGNOSIS — R0602 Shortness of breath: Secondary | ICD-10-CM | POA: Diagnosis not present

## 2024-08-24 DIAGNOSIS — R3915 Urgency of urination: Secondary | ICD-10-CM | POA: Diagnosis not present

## 2024-08-24 LAB — POCT URINALYSIS DIP (CLINITEK)
Bilirubin, UA: NEGATIVE
Glucose, UA: NEGATIVE mg/dL
Ketones, POC UA: NEGATIVE mg/dL
Nitrite, UA: NEGATIVE
POC PROTEIN,UA: NEGATIVE
Spec Grav, UA: 1.01
Urobilinogen, UA: 0.2 U/dL
pH, UA: 6

## 2024-08-24 MED ORDER — CIPROFLOXACIN HCL 500 MG PO TABS: 500.0000 mg | ORAL_TABLET | Freq: Two times a day (BID) | ORAL | 0 refills | Status: AC

## 2024-08-24 MED ORDER — ALBUTEROL SULFATE HFA 108 (90 BASE) MCG/ACT IN AERS: 2.0000 | INHALATION_SPRAY | Freq: Four times a day (QID) | RESPIRATORY_TRACT | 0 refills | Status: AC | PRN

## 2024-08-24 NOTE — Progress Notes (Signed)
 "  Subjective:  Patient ID: Molly Parrish, female    DOB: 09/08/1965  Age: 59 y.o. MRN: [id number]  Chief Complaint  Patient presents with   Medical Management of Chronic Issues    Follow up from pneumonia   Urinary Urgency    Discussed the use of AI scribe software for clinical note transcription with the patient, who gave verbal consent to proceed.  History of Present Illness   Molly Parrish is a 59 year old female who presents with persistent wheezing and shortness of breath.  Respiratory symptoms - Persistent wheezing and shortness of breath, ongoing for two weeks - Symptoms are particularly noticeable with minimal exertion, such as walking short distances (e.g., from parking lot to clinic) - Persistent cough and wheezing unresponsive to initial treatments - Current inhaler regimen includes albuterol  as needed (using TWICE A DAY) and Symbicort  two puffs twice daily, with continued symptoms - History of pneumonia previously treated with Rocephin  injection, prednisone , doxycycline , and Tessalon  Perles with some improvement - Recent white blood cell count elevated at 16.9 - 08/23/24; previous value was 17.5 - 07/28/24  Urinary symptoms and infection risk - Concern for urinary tract infection due to sensation of pressure - History of kidney infection complicated by sepsis requiring hospitalization - No recent changes in detergents or body wash - No fever  Glucose fluctuations - Not diabetic but wearing a glucose monitor due to fluctuating glucose levels  Renal function and bone health - GFR has been low, ranging from 40 to 50 - Recent bone density test performed due to concern for bone health  Thyroid  and calcium laboratory findings - TSH last checked in October, within normal range at 4.470 - Calcium level normal at 8.9 in January, previously elevated in December  Blood pressure - Takes blood pressure medication three times daily - Blood pressure usually not  elevated - Concerned about elevated blood pressure today, possibly due to walking up the hill to today's appointment         07/28/2024    8:41 AM 06/06/2024   11:00 AM 04/25/2024    9:44 AM 04/18/2024   12:02 PM 01/07/2024   11:44 AM  Depression screen PHQ 2/9  Decreased Interest 0 0 1 1 2   Down, Depressed, Hopeless 0 0 1 1 0  PHQ - 2 Score 0 0 2 2 2   Altered sleeping 3  3 3 1   Tired, decreased energy 2  0 0 3  Change in appetite 2  0 0 0  Feeling bad or failure about yourself  0  0 0 1  Trouble concentrating 3  1 1 2   Moving slowly or fidgety/restless 3  1 1  0  Suicidal thoughts 0  0 0 0  PHQ-9 Score 13  7  7  9    Difficult doing work/chores Not difficult at all  Not difficult at all Not difficult at all Not difficult at all     Data saved with a previous flowsheet row definition        01/07/2024   11:43 AM  Fall Risk   Falls in the past year? 1  Number falls in past yr: 1  Injury with Fall? 1   Risk for fall due to : Impaired balance/gait;Impaired mobility;History of fall(s)  Follow up Falls evaluation completed     Data saved with a previous flowsheet row definition    Patient Care Team: Sherre Clapper, MD as PCP - General (Family Medicine) Claudene Elsie JUDITHANN Mickey., MD (  Endocrinology) Ernesto Grayson, MD as Referring Physician (Geriatric Medicine) [facility], [facility] Maurice Loving, MD as Referring Physician (Psychiatry) Joshua Ozell LABOR, DPM (Inactive) as Referring Physician (Podiatry) Ezzard Valaria LABOR, MD as Consulting Physician (Oncology) Sadiq, Sameea, MD as Referring Physician (Nephrology)   Review of Systems  Constitutional:  Negative for chills, diaphoresis, fatigue and fever.  HENT:  Negative for congestion, ear pain and sinus pain.   Respiratory:  Positive for shortness of breath and wheezing. Negative for cough.   Cardiovascular:  Negative for chest pain.  Gastrointestinal:  Positive for vomiting. Negative for abdominal pain, constipation, diarrhea and  nausea.  Genitourinary:  Positive for urgency (pressure). Negative for dysuria, flank pain and frequency.  Musculoskeletal:  Negative for arthralgias.  Neurological:  Negative for dizziness, weakness, light-headedness and headaches.  Psychiatric/Behavioral:  Negative for dysphoric mood. The patient is not nervous/anxious.     Medications Ordered Prior to Encounter[1] Past Medical History:  Diagnosis Date   Alcohol dependence in remission (HCC) 10/04/2013   Alcoholic cirrhosis of liver (HCC)    Anemia    Anemia of chronic disease 08/08/2016   Arterial hypotension 05/08/2020   Bipolar disorder current episode depressed (HCC)    Blood transfusion without reported diagnosis    Brain TIA 02/19/2020   Cancer (HCC)    CKD (chronic kidney disease)    Episodic mood disorder 11/07/2013   Generalized anxiety disorder    Generalized weakness 07/11/2020   GERD (gastroesophageal reflux disease)    H/O bariatric surgery 06/11/2020   Hematoma of chest wall, left, sequela 08/28/2023   Hypocalcemia 12/10/2015   Hypokalemia    Hyponatremia 02/19/2020   Intestinal malabsorption    Iron  deficiency anemia due to chronic blood loss 05/08/2020   Korsakoff syndrome    Lumbar disc disease 10/19/2019   Microscopic hematuria 04/16/2021   Migraine 04/20/2014   Mild recurrent major depression 04/19/2020   Multiple closed fractures of ribs of left side 06/03/2020   Formatting of this note might be different from the original. Added automatically from request for surgery [id number]   Neuropathy 01/31/2020   Obsessive-compulsive disorder 11/07/2013   Other osteoporosis without current pathological fracture    Peripheral edema 11/01/2020   Polypharmacy 06/12/2020   Portal hypertension (HCC)    Post-surgical hypothyroidism 12/10/2015   Primary insomnia 03/26/2021   Sepsis due to methicillin resistant Staphylococcus aureus (MRSA) (HCC) 09/12/2023   Severe protein-calorie malnutrition 03/26/2021   Shortness  of breath 11/01/2020   Stage 3b chronic kidney disease (HCC) 04/16/2021   Stroke (HCC)     left basal ganglia stroke (hemorrhagic)   Telogen effluvium 03/26/2021   Ulcer    Ulcer of left foot with fat layer exposed (HCC) 10/04/2020   Ulcer of right foot, with fat layer exposed (HCC) 10/10/2020   Vitamin D  deficiency    Past Surgical History:  Procedure Laterality Date   ABDOMINAL HYSTERECTOMY     BREAST EXCISIONAL BIOPSY Left    CATARACT EXTRACTION     CHOLECYSTECTOMY     FRACTURE SURGERY     GASTRIC BYPASS  2005   HEMORRHOID SURGERY     HERNIA REPAIR     metatarsus abductus surgery Left 1990   PARTIAL HYSTERECTOMY  05/1997   BL Ovaries remain. Done for fibroids.   THYROIDECTOMY  03/2013   TUBAL LIGATION      Family History  Problem Relation Age of Onset   Breast cancer Mother    Osteoporosis Mother    Cancer Mother  Breast, lung, skin.    COPD Mother    Breast cancer Maternal Grandmother    Social History   Socioeconomic History   Marital status: Married    Spouse name: Not on file   Number of children: 1   Years of education: Not on file   Highest education level: Associate degree: occupational, scientist, product/process development, or vocational program  Occupational History   Occupation: IT TRAINER    Comment: Retired.  Tobacco Use   Smoking status: Never   Smokeless tobacco: Never  Substance and Sexual Activity   Alcohol use: Not Currently    Comment: history of alcoholism. sober since 2014.   Drug use: Never   Sexual activity: Yes  Other Topics Concern   Not on file  Social History Narrative   Right handed   Social Drivers of Health   Tobacco Use: Low Risk (08/24/2024)   Patient History    Smoking Tobacco Use: Never    Smokeless Tobacco Use: Never    Passive Exposure: Not on file  Financial Resource Strain: Low Risk (07/27/2024)   Overall Financial Resource Strain (CARDIA)    Difficulty of Paying Living Expenses: Not hard at all  Food Insecurity: No Food Insecurity  (07/27/2024)   Epic    Worried About Radiation Protection Practitioner of Food in the Last Year: Never true    Ran Out of Food in the Last Year: Never true  Transportation Needs: No Transportation Needs (07/27/2024)   Epic    Lack of Transportation (Medical): No    Lack of Transportation (Non-Medical): No  Physical Activity: Insufficiently Active (07/27/2024)   Exercise Vital Sign    Days of Exercise per Week: 2 days    Minutes of Exercise per Session: 30 min  Stress: Stress Concern Present (07/27/2024)   [employer] of Occupational Health - Occupational Stress Questionnaire    Feeling of Stress: Very much  Social Connections: Moderately Isolated (07/27/2024)   Social Connection and Isolation Panel    Frequency of Communication with Friends and Family: More than three times a week    Frequency of Social Gatherings with Friends and Family: Once a week    Attends Religious Services: Never    Database Administrator or Organizations: No    Attends Engineer, Structural: Not on file    Marital Status: Married  Depression (PHQ2-9): High Risk (07/28/2024)   Depression (PHQ2-9)    PHQ-2 Score: 13  Alcohol Screen: Low Risk (09/07/2023)   Alcohol Screen    Last Alcohol Screening Score (AUDIT): 0  Housing: Low Risk (07/27/2024)   Epic    Unable to Pay for Housing in the Last Year: No    Number of Times Moved in the Last Year: 0    Homeless in the Last Year: No  Utilities: Not At Risk (04/17/2024)   Epic    Threatened with loss of utilities: No  Health Literacy: Adequate Health Literacy (09/07/2023)   B1300 Health Literacy    Frequency of need for help with medical instructions: Never    Objective:  BP 120/70   Pulse 94   Temp 97.8 F (36.6 C) (Temporal)   Resp 20   Ht 5' 1.5 (1.562 m)   Wt 162 lb 6.4 oz (73.7 kg)   SpO2 95%   BMI 30.19 kg/m      08/24/2024    3:29 PM 08/24/2024    2:43 PM 07/28/2024    8:14 AM  BP/Weight  Systolic BP 120 150 136  Diastolic BP  70 78 82  Wt. (Lbs)  162.4 161  BMI   30.19 kg/m2 29.93 kg/m2    Physical Exam Constitutional:      General: She is not in acute distress.    Appearance: Normal appearance. She is not ill-appearing.  HENT:     Nose: Congestion present. No rhinorrhea.  Eyes:     Conjunctiva/sclera: Conjunctivae normal.  Cardiovascular:     Rate and Rhythm: Normal rate and regular rhythm.     Heart sounds: Normal heart sounds. No murmur heard. Pulmonary:     Effort: Pulmonary effort is normal. No respiratory distress.     Breath sounds: Examination of the right-lower field reveals decreased breath sounds. Examination of the left-lower field reveals decreased breath sounds. Decreased breath sounds present. No wheezing or rhonchi.  Neurological:     Mental Status: She is alert and oriented to person, place, and time.  Psychiatric:        Mood and Affect: Mood normal.        Behavior: Behavior normal.     Lab Results  Component Value Date   WBC 16.3 (H) 08/23/2024   HGB 11.5 (L) 08/23/2024   HCT 35.6 (L) 08/23/2024   PLT 331 08/23/2024   GLUCOSE 116 (H) 07/28/2024   CHOL 126 01/07/2024   TRIG 87 01/07/2024   HDL 61 01/07/2024   LDLCALC 48 01/07/2024   ALT 19 07/28/2024   AST 25 07/28/2024   NA 134 07/28/2024   K 3.4 (L) 07/28/2024   CL 94 (L) 07/28/2024   CREATININE 1.45 (H) 07/28/2024   BUN 17 07/28/2024   CO2 23 07/28/2024   TSH 4.470 04/26/2024    Results for orders placed or performed in visit on 08/24/24  POCT URINALYSIS DIP (CLINITEK)   Collection Time: 08/24/24  3:27 PM  Result Value Ref Range   Color, UA yellow yellow   Clarity, UA clear clear   Glucose, UA negative negative mg/dL   Bilirubin, UA negative negative   Ketones, POC UA negative negative mg/dL   Spec Grav, UA 8.989 8.989 - 1.025   Blood, UA trace-intact (A) negative   pH, UA 6.0 5.0 - 8.0   POC PROTEIN,UA negative negative, trace   Urobilinogen, UA 0.2 0.2 or 1.0 E.U./dL   Nitrite, UA Negative Negative   Leukocytes, UA Moderate (2+) (A)  Negative  .  Assessment & Plan:   Assessment & Plan Shortness of breath Persistent symptoms despite treatment. Recent medications include Rocephin , prednisone , doxycycline , and Tessalon  Perles. Albuterol  used twice daily. Normal oxygen saturation. CT scan with contrast scheduled. - Counseled on immunosuppression and elevated WBC risks with prednisone . - Continue inhaler regimen with albuterol  and Symbicort . - Proceed with scheduled CT scan with contrast. Orders:   albuterol  (VENTOLIN  HFA) 108 (90 Base) MCG/ACT inhaler; Inhale 2 puffs into the lungs every 6 (six) hours as needed for wheezing or shortness of breath.  Acquired hypothyroidism Hypothyroidism TSH levels normal in October. Reports hair loss and thyroid  concerns. Lab Results  Component Value Date   TSH 4.470 04/26/2024  - Ordered TSH test. Orders:   T4, free   TSH  Urinary urgency UTI Patient here with symptoms of dysuria started yesterday. UA in the office showed Lab Results  Component Value Date   COLORU yellow 08/24/2024   CLARITYU clear 08/24/2024   GLUCOSEUR negative 08/24/2024   BILIRUBINUR negative 08/24/2024   KETONESU negative 01/28/2022   SPECGRAV 1.010 08/24/2024   RBCUR trace-intact (A) 08/24/2024   PHUR 6.0  08/24/2024   PROTEINUR Negative 01/28/2022   UROBILINOGEN 0.2 08/24/2024   LEUKOCYTESUR Moderate (2+) (A) 08/24/2024    No suprapubic tenderness noted on exam.  She does not appear septic, has no flank tenderness.  Vitals reassuring.   Plan: Symptoms suggestive of urinary tract infection. I have prescribed Cipro  500 mg by mouth TWICE A DAY for 5 days. Your symptoms should gradually improve. Call if the burning worsens, you develop a fever, back pain or pelvic pain or if your symptom do not resolve after completing the antibiotic. Drink plenty of fluids Complete the full course of antibiotics even if the symptoms resolve Remember to wipe from front to back and don't hold it in! If possible,  empty your bladder every 4 hours. Advised to watch out for any fever/chills/back pain and to go to the emergency room if she does for evaluation for pyelonephritis and IV fluids and IV antibiotics.  Orders:   POCT URINALYSIS DIP (CLINITEK)   Urine Culture  Acute cystitis with hematuria Acute cystitis with hematuria Confirmed by urinalysis with leukocytes and blood. Elevated WBC count suggests infection. History of sepsis from UTI. - Counseled on gastrointestinal upset with ciprofloxacin . - Prescribed Cipro  for 5 days. - Sent urine for culture. Orders:   ciprofloxacin  (CIPRO ) 500 MG tablet; Take 1 tablet (500 mg total) by mouth 2 (two) times daily for 5 days.  Localized osteoporosis without current pathological fracture Osteoporosis Recent bone density test. Plans for Evenity infusion pending. Concerns about calcium levels and bone health. Previous collarbone fracture noted. - Coordinate Evenity infusion setup. - Monitor calcium levels.      Meds ordered this encounter  Medications   albuterol  (VENTOLIN  HFA) 108 (90 Base) MCG/ACT inhaler    Sig: Inhale 2 puffs into the lungs every 6 (six) hours as needed for wheezing or shortness of breath.    Dispense:  8 g    Refill:  0   ciprofloxacin  (CIPRO ) 500 MG tablet    Sig: Take 1 tablet (500 mg total) by mouth 2 (two) times daily for 5 days.    Dispense:  10 tablet    Refill:  0    Orders Placed This Encounter  Procedures   Urine Culture   T4, free   TSH   POCT URINALYSIS DIP (CLINITEK)       Follow-up: Return if symptoms worsen or fail to improve.  An After Visit Summary was printed and given to the patient.  Harrie Cedar, FNP [facility] [phone]      [1]  Current Outpatient Medications on File Prior to Visit  Medication Sig Dispense Refill   ALPRAZolam  (XANAX ) 0.25 MG tablet Take 1 tablet by mouth twice daily as needed for anxiety 60 tablet 0   benzonatate  (TESSALON ) 200 MG capsule Take 1 capsule (200  mg total) by mouth 3 (three) times daily as needed for cough. 30 capsule 0   budesonide -formoterol  (SYMBICORT ) 160-4.5 MCG/ACT inhaler Inhale 2 puffs into the lungs 2 (two) times daily.     calcitRIOL (ROCALTROL) 0.5 MCG capsule Take 0.5 mcg by mouth daily.     CONSTULOSE 10 GM/15ML solution Take 20 g by mouth 2 (two) times daily.     cyclobenzaprine  (FLEXERIL ) 10 MG tablet Take 1 tablet by mouth three times daily as needed for muscle spasm 90 tablet 3   famotidine  (PEPCID ) 40 MG tablet Take 40 mg by mouth at bedtime.     fluticasone  (FLONASE ) 50 MCG/ACT nasal spray Place 2 sprays into both  nostrils daily. 16 g 6   folic acid  (FOLVITE ) 800 MCG tablet Take 800 mcg by mouth daily.     furosemide  (LASIX ) 40 MG tablet Take 2 tablets by mouth twice daily 360 tablet 2   gabapentin  (NEURONTIN ) 100 MG capsule TAKE 1 CAPSULE BY MOUTH THREE TIMES DAILY 90 capsule 3   Glucagon  (GVOKE HYPOPEN  2-PACK) 0.5 MG/0.1ML SOAJ If glucose less than 60, use gvoke pen x 1. May repeat if not responding x 1 daily. Call EMS if not improving. 0.2 mL 2   hydrOXYzine  (VISTARIL ) 25 MG capsule TAKE 1 CAPSULE BY MOUTH THREE TIMES DAILY 90 capsule 0   Iron -Vitamins (GERITOL COMPLETE) TABS Take 1 tablet by mouth daily.     lamoTRIgine (LAMICTAL) 200 MG tablet Take 200 mg by mouth at bedtime.     levothyroxine  (SYNTHROID ) 150 MCG tablet TAKE 1 TABLET BY MOUTH ONCE DAILY BEFORE BREAKFAST 90 tablet 0   magnesium oxide (MAG-OX) 400 MG tablet Take 400 mg by mouth daily.     midodrine  (PROAMATINE ) 2.5 MG tablet TAKE 1 TABLET BY MOUTH THREE TIMES DAILY WITH MEALS 90 tablet 0   ondansetron  (ZOFRAN ) 4 MG tablet TAKE 1 TABLET BY MOUTH EVERY 8 HOURS AS NEEDED FOR NAUSEA AND VOMITING 60 tablet 0   predniSONE  (DELTASONE ) 50 MG tablet Take 1 tablet (50 mg total) by mouth daily with breakfast. 5 tablet 0   propranolol (INDERAL) 10 MG tablet Take 5 mg by mouth at bedtime.     QUEtiapine (SEROQUEL) 50 MG tablet Take 100 mg by mouth at bedtime.      sertraline (ZOLOFT) 100 MG tablet Take 100 mg by mouth in the morning and at bedtime.     sevelamer carbonate (RENVELA) 800 MG tablet Take 800 mg by mouth.     spironolactone  (ALDACTONE ) 50 MG tablet Take 2 tablets by mouth twice daily 120 tablet 0   thiamine 100 MG tablet Take 100 mg by mouth daily.     traMADol  (ULTRAM ) 50 MG tablet Take 2 tablets (100 mg total) by mouth 2 (two) times daily as needed (back pain). 60 tablet 0   triamcinolone  (KENALOG ) 0.1 % paste APPLY SMALL AMOUNT TWICE DAILY AS DIRECTED IN MOUTH OR THROAT 5 g 3   zolpidem  (AMBIEN ) 10 MG tablet Take 1 tablet (10 mg total) by mouth at bedtime. 30 tablet 2   No current facility-administered medications on file prior to visit.   "

## 2024-08-24 NOTE — Assessment & Plan Note (Signed)
 UTI Patient here with symptoms of dysuria started yesterday. UA in the office showed Lab Results  Component Value Date   COLORU yellow 08/24/2024   CLARITYU clear 08/24/2024   GLUCOSEUR negative 08/24/2024   BILIRUBINUR negative 08/24/2024   KETONESU negative 01/28/2022   SPECGRAV 1.010 08/24/2024   RBCUR trace-intact (A) 08/24/2024   PHUR 6.0 08/24/2024   PROTEINUR Negative 01/28/2022   UROBILINOGEN 0.2 08/24/2024   LEUKOCYTESUR Moderate (2+) (A) 08/24/2024    No suprapubic tenderness noted on exam.  She does not appear septic, has no flank tenderness.  Vitals reassuring.   Plan: Symptoms suggestive of urinary tract infection. I have prescribed Cipro  500 mg by mouth TWICE A DAY for 5 days. Your symptoms should gradually improve. Call if the burning worsens, you develop a fever, back pain or pelvic pain or if your symptom do not resolve after completing the antibiotic. Drink plenty of fluids Complete the full course of antibiotics even if the symptoms resolve Remember to wipe from front to back and don't hold it in! If possible, empty your bladder every 4 hours. Advised to watch out for any fever/chills/back pain and to go to the emergency room if she does for evaluation for pyelonephritis and IV fluids and IV antibiotics.  Orders:   POCT URINALYSIS DIP (CLINITEK)   Urine Culture

## 2024-08-24 NOTE — Assessment & Plan Note (Signed)
 Osteoporosis Recent bone density test. Plans for [facility] infusion pending. Concerns about calcium levels and bone health. Previous collarbone fracture noted. - Coordinate [facility] infusion setup. - Monitor calcium levels.

## 2024-08-24 NOTE — Assessment & Plan Note (Signed)
 Persistent symptoms despite treatment. Recent medications include Rocephin , prednisone , doxycycline , and Tessalon  Perles. Albuterol  used twice daily. Normal oxygen saturation. CT scan with contrast scheduled. - Counseled on immunosuppression and elevated WBC risks with prednisone . - Continue inhaler regimen with albuterol  and Symbicort . - Proceed with scheduled CT scan with contrast. Orders:   albuterol  (VENTOLIN  HFA) 108 (90 Base) MCG/ACT inhaler; Inhale 2 puffs into the lungs every 6 (six) hours as needed for wheezing or shortness of breath.

## 2024-08-24 NOTE — Assessment & Plan Note (Signed)
 Acute cystitis with hematuria Confirmed by urinalysis with leukocytes and blood. Elevated WBC count suggests infection. History of sepsis from UTI. - Counseled on gastrointestinal upset with ciprofloxacin . - Prescribed Cipro  for 5 days. - Sent urine for culture. Orders:   ciprofloxacin  (CIPRO ) 500 MG tablet; Take 1 tablet (500 mg total) by mouth 2 (two) times daily for 5 days.

## 2024-08-24 NOTE — Assessment & Plan Note (Signed)
 Hypothyroidism TSH levels normal in October. Reports hair loss and thyroid  concerns. Lab Results  Component Value Date   TSH 4.470 04/26/2024  - Ordered TSH test. Orders:   T4, free   TSH

## 2024-08-25 LAB — T4, FREE: Free T4: 1.09 ng/dL (ref 0.82–1.77)

## 2024-08-25 LAB — TSH: TSH: 16 u[IU]/mL — ABNORMAL HIGH (ref 0.450–4.500)

## 2024-08-25 NOTE — Progress Notes (Signed)
 Molly Parrish                                          MRN: [id number]   08/25/2024   The [facility] Quality Team Specialist reviewed this patient medical record for the purposes of chart review for care gap closure. The following were reviewed: chart review for care gap closure-kidney health evaluation for diabetes:eGFR  and uACR.    [facility] Quality Team

## 2024-08-28 ENCOUNTER — Ambulatory Visit (HOSPITAL_BASED_OUTPATIENT_CLINIC_OR_DEPARTMENT_OTHER)

## 2024-09-13 ENCOUNTER — Inpatient Hospital Stay

## 2024-10-04 ENCOUNTER — Inpatient Hospital Stay: Admitting: Oncology

## 2024-10-04 ENCOUNTER — Inpatient Hospital Stay

## 2024-10-11 ENCOUNTER — Inpatient Hospital Stay

## 2024-11-01 ENCOUNTER — Ambulatory Visit: Admitting: Family Medicine
